# Patient Record
Sex: Male | Born: 1940
Health system: Southern US, Community
[De-identification: ages and names within clinical notes are randomized; demographics above are authoritative.]

## PROBLEM LIST (undated history)

## (undated) DIAGNOSIS — I714 Abdominal aortic aneurysm, without rupture, unspecified: Secondary | ICD-10-CM

## (undated) DIAGNOSIS — E785 Hyperlipidemia, unspecified: Secondary | ICD-10-CM

## (undated) DIAGNOSIS — M109 Gout, unspecified: Secondary | ICD-10-CM

## (undated) DIAGNOSIS — I10 Essential (primary) hypertension: Secondary | ICD-10-CM

## (undated) HISTORY — DX: Gout, unspecified: M10.9

## (undated) HISTORY — DX: Hyperlipidemia, unspecified: E78.5

## (undated) HISTORY — DX: Abdominal aortic aneurysm, without rupture: I71.4

## (undated) HISTORY — DX: Essential (primary) hypertension: I10

## (undated) HISTORY — DX: Abdominal aortic aneurysm, without rupture, unspecified: I71.40

---

## 2003-11-24 LAB — HM COLONOSCOPY: HM Colonoscopy: NORMAL

## 2005-08-20 ENCOUNTER — Ambulatory Visit: Payer: Self-pay

## 2012-07-20 ENCOUNTER — Ambulatory Visit: Payer: Self-pay | Admitting: Medical

## 2012-07-29 ENCOUNTER — Ambulatory Visit: Payer: Self-pay

## 2016-01-26 DIAGNOSIS — E785 Hyperlipidemia, unspecified: Secondary | ICD-10-CM | POA: Insufficient documentation

## 2016-01-26 DIAGNOSIS — E782 Mixed hyperlipidemia: Secondary | ICD-10-CM | POA: Insufficient documentation

## 2016-01-26 DIAGNOSIS — I10 Essential (primary) hypertension: Secondary | ICD-10-CM | POA: Insufficient documentation

## 2016-01-26 DIAGNOSIS — M109 Gout, unspecified: Secondary | ICD-10-CM | POA: Insufficient documentation

## 2016-02-08 ENCOUNTER — Emergency Department: Payer: Medicare Other

## 2016-02-08 ENCOUNTER — Inpatient Hospital Stay: Payer: Medicare Other

## 2016-02-08 ENCOUNTER — Encounter
Admission: EM | Disposition: A | Discharge status: Home / Self Care | Payer: Self-pay | Source: Home / Self Care | Attending: Vascular Surgery

## 2016-02-08 ENCOUNTER — Emergency Department: Payer: Medicare Other | Admitting: Anesthesiology

## 2016-02-08 ENCOUNTER — Inpatient Hospital Stay
Admission: EM | Admit: 2016-02-08 | Discharge: 2016-02-17 | DRG: 268 | Disposition: A | Discharge status: Home / Self Care | Payer: Medicare Other | Attending: Vascular Surgery | Admitting: Vascular Surgery

## 2016-02-08 ENCOUNTER — Encounter: Payer: Self-pay | Admitting: Emergency Medicine

## 2016-02-08 DIAGNOSIS — R578 Other shock: Secondary | ICD-10-CM | POA: Diagnosis present

## 2016-02-08 DIAGNOSIS — K567 Ileus, unspecified: Secondary | ICD-10-CM | POA: Diagnosis not present

## 2016-02-08 DIAGNOSIS — G4730 Sleep apnea, unspecified: Secondary | ICD-10-CM | POA: Diagnosis present

## 2016-02-08 DIAGNOSIS — F0390 Unspecified dementia without behavioral disturbance: Secondary | ICD-10-CM | POA: Diagnosis present

## 2016-02-08 DIAGNOSIS — N170 Acute kidney failure with tubular necrosis: Secondary | ICD-10-CM | POA: Diagnosis not present

## 2016-02-08 DIAGNOSIS — R05 Cough: Secondary | ICD-10-CM

## 2016-02-08 DIAGNOSIS — E785 Hyperlipidemia, unspecified: Secondary | ICD-10-CM | POA: Diagnosis present

## 2016-02-08 DIAGNOSIS — I713 Abdominal aortic aneurysm, ruptured, unspecified: Secondary | ICD-10-CM | POA: Diagnosis present

## 2016-02-08 DIAGNOSIS — R410 Disorientation, unspecified: Secondary | ICD-10-CM | POA: Diagnosis not present

## 2016-02-08 DIAGNOSIS — F1020 Alcohol dependence, uncomplicated: Secondary | ICD-10-CM | POA: Diagnosis present

## 2016-02-08 DIAGNOSIS — I10 Essential (primary) hypertension: Secondary | ICD-10-CM | POA: Diagnosis present

## 2016-02-08 DIAGNOSIS — J9600 Acute respiratory failure, unspecified whether with hypoxia or hypercapnia: Secondary | ICD-10-CM | POA: Diagnosis not present

## 2016-02-08 DIAGNOSIS — Z87891 Personal history of nicotine dependence: Secondary | ICD-10-CM

## 2016-02-08 DIAGNOSIS — E875 Hyperkalemia: Secondary | ICD-10-CM | POA: Diagnosis present

## 2016-02-08 DIAGNOSIS — J690 Pneumonitis due to inhalation of food and vomit: Secondary | ICD-10-CM | POA: Diagnosis not present

## 2016-02-08 DIAGNOSIS — K9189 Other postprocedural complications and disorders of digestive system: Secondary | ICD-10-CM | POA: Diagnosis not present

## 2016-02-08 DIAGNOSIS — E876 Hypokalemia: Secondary | ICD-10-CM | POA: Diagnosis not present

## 2016-02-08 DIAGNOSIS — G9349 Other encephalopathy: Secondary | ICD-10-CM | POA: Diagnosis not present

## 2016-02-08 DIAGNOSIS — R059 Cough, unspecified: Secondary | ICD-10-CM

## 2016-02-08 DIAGNOSIS — Z9114 Patient's other noncompliance with medication regimen: Secondary | ICD-10-CM

## 2016-02-08 DIAGNOSIS — I959 Hypotension, unspecified: Secondary | ICD-10-CM | POA: Diagnosis not present

## 2016-02-08 DIAGNOSIS — M545 Low back pain, unspecified: Secondary | ICD-10-CM

## 2016-02-08 DIAGNOSIS — I9589 Other hypotension: Secondary | ICD-10-CM | POA: Diagnosis present

## 2016-02-08 DIAGNOSIS — J95821 Acute postprocedural respiratory failure: Secondary | ICD-10-CM | POA: Diagnosis not present

## 2016-02-08 DIAGNOSIS — F10231 Alcohol dependence with withdrawal delirium: Secondary | ICD-10-CM | POA: Diagnosis not present

## 2016-02-08 HISTORY — PX: PERIPHERAL VASCULAR CATHETERIZATION: SHX172C

## 2016-02-08 LAB — CBC WITH DIFFERENTIAL/PLATELET
BASOS PCT: 1 %
Basophils Absolute: 0.1 10*3/uL (ref 0–0.1)
Eosinophils Absolute: 0.1 10*3/uL (ref 0–0.7)
Eosinophils Relative: 1 %
HCT: 41.1 % (ref 40.0–52.0)
HEMOGLOBIN: 13.7 g/dL (ref 13.0–18.0)
LYMPHS ABS: 2.2 10*3/uL (ref 1.0–3.6)
Lymphocytes Relative: 14 %
MCH: 32.6 pg (ref 26.0–34.0)
MCHC: 33.3 g/dL (ref 32.0–36.0)
MCV: 97.9 fL (ref 80.0–100.0)
MONO ABS: 0.9 10*3/uL (ref 0.2–1.0)
MONOS PCT: 6 %
NEUTROS ABS: 12.2 10*3/uL — AB (ref 1.4–6.5)
Neutrophils Relative %: 78 %
PLATELETS: 332 10*3/uL (ref 150–440)
RBC: 4.2 MIL/uL — ABNORMAL LOW (ref 4.40–5.90)
RDW: 13.7 % (ref 11.5–14.5)
WBC: 15.5 10*3/uL — ABNORMAL HIGH (ref 3.8–10.6)

## 2016-02-08 LAB — ABO/RH: ABO/RH(D): A POS

## 2016-02-08 LAB — COMPREHENSIVE METABOLIC PANEL
ALT: 11 U/L — AB (ref 17–63)
ANION GAP: 13 (ref 5–15)
AST: 18 U/L (ref 15–41)
Albumin: 3.5 g/dL (ref 3.5–5.0)
Alkaline Phosphatase: 105 U/L (ref 38–126)
BUN: 10 mg/dL (ref 6–20)
CALCIUM: 9.1 mg/dL (ref 8.9–10.3)
CHLORIDE: 100 mmol/L — AB (ref 101–111)
CO2: 24 mmol/L (ref 22–32)
CREATININE: 1.08 mg/dL (ref 0.61–1.24)
Glucose, Bld: 216 mg/dL — ABNORMAL HIGH (ref 65–99)
Potassium: 3.1 mmol/L — ABNORMAL LOW (ref 3.5–5.1)
SODIUM: 137 mmol/L (ref 135–145)
Total Bilirubin: 1 mg/dL (ref 0.3–1.2)
Total Protein: 8 g/dL (ref 6.5–8.1)

## 2016-02-08 LAB — PROTIME-INR
INR: 1.06
Prothrombin Time: 14 seconds (ref 11.4–15.0)

## 2016-02-08 LAB — TROPONIN I

## 2016-02-08 LAB — APTT: APTT: 26 s (ref 24–36)

## 2016-02-08 LAB — ETHANOL

## 2016-02-08 LAB — PREPARE RBC (CROSSMATCH)

## 2016-02-08 IMAGING — CT CT CTA ABD/PEL W/CM AND/OR W/O CM
2 of 7 series · 15 of 46 positions shown, 17 images · IV contrast (isovue)
Comparison: Plain film from earlier in the same day.

CLINICAL DATA: Increasing back pain

EXAM:
CTA ABDOMEN AND PELVIS wITHOUT AND WITH CONTRAST
TECHNIQUE: Multidetector CT imaging of the abdomen and pelvis was performed
using the standard protocol during bolus administration of
intravenous contrast. Multiplanar reconstructed images and MIPs were
obtained and reviewed to evaluate the vascular anatomy.
CONTRAST:  100 mL Isovue 370.

[Series 4: arterial · axial · arterial · 0.67mm/px · z∈[-570,-142]mm · 12 of 238 slices shown, 14 images]
[im 12/238  soft-tissue]
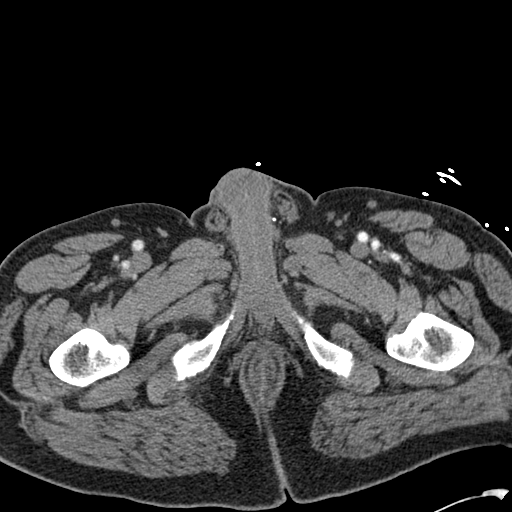
[im 12/238  bone]
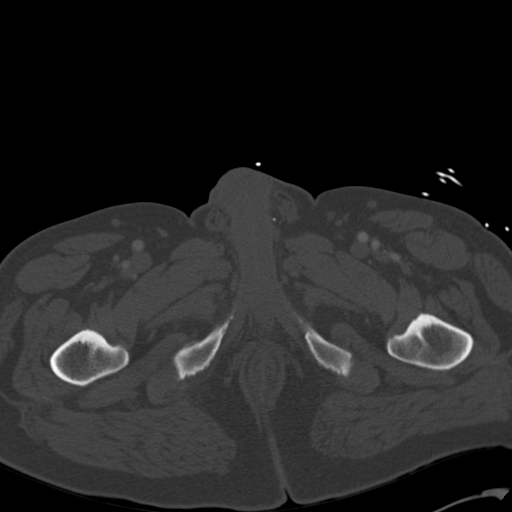
[im 36/238  soft-tissue]
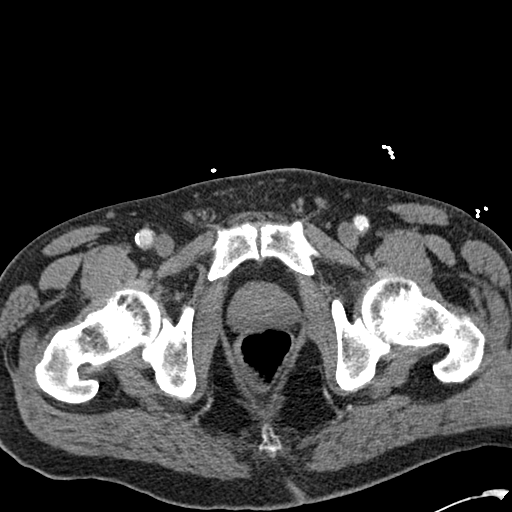
[im 48/238  soft-tissue]
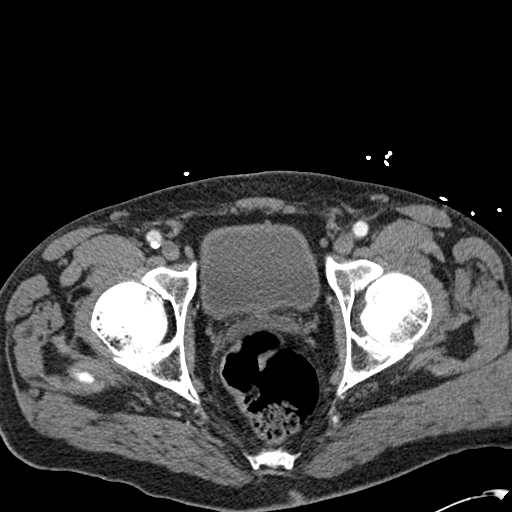
[im 72/238  soft-tissue]
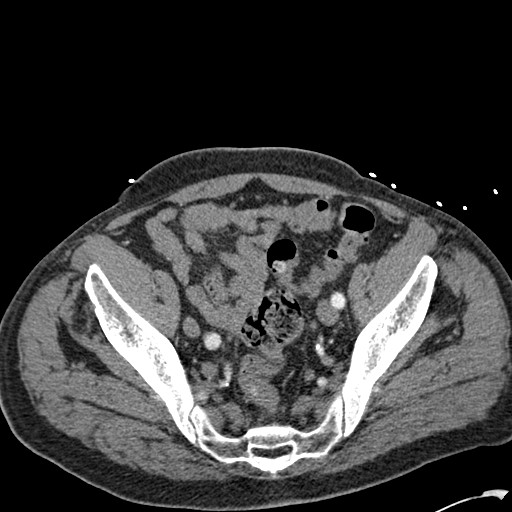
[im 95/238  soft-tissue]
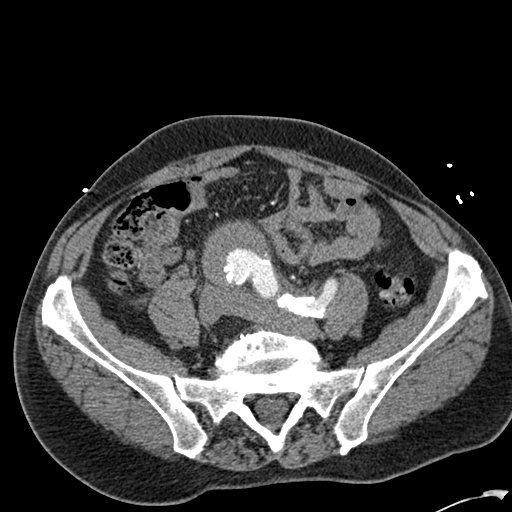
[im 107/238  soft-tissue]
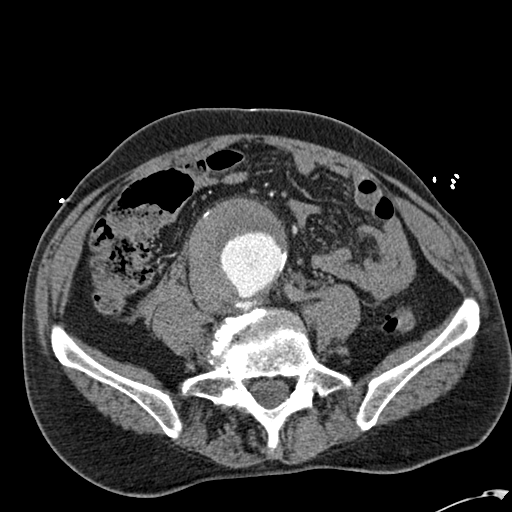
[im 131/238  soft-tissue]
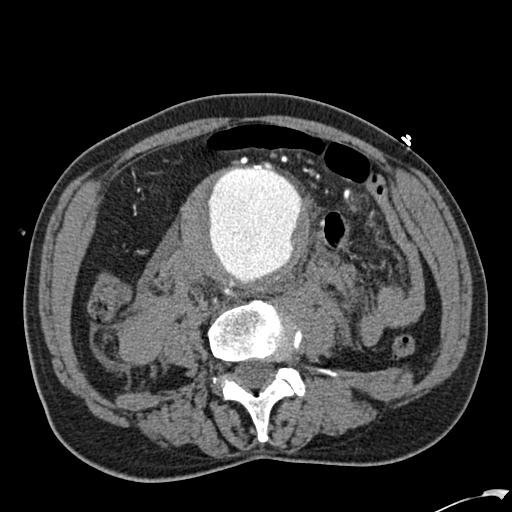
[im 143/238  soft-tissue]
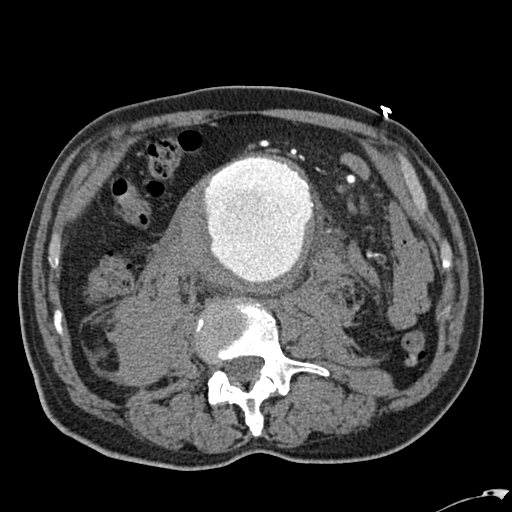
[im 166/238  soft-tissue]
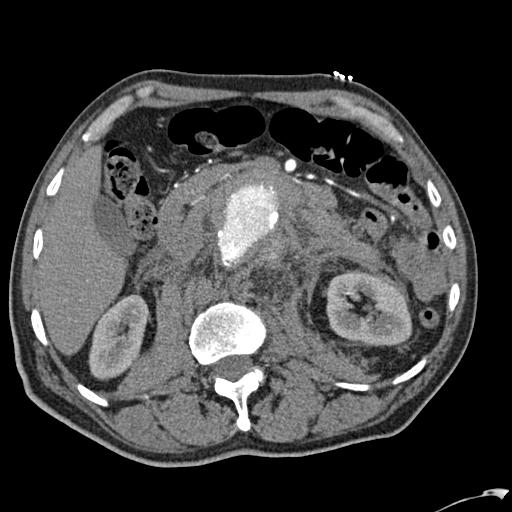
[im 166/238  bone]
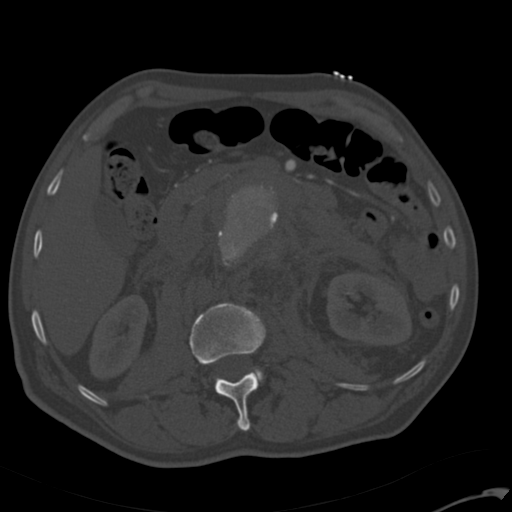
[im 190/238  soft-tissue]
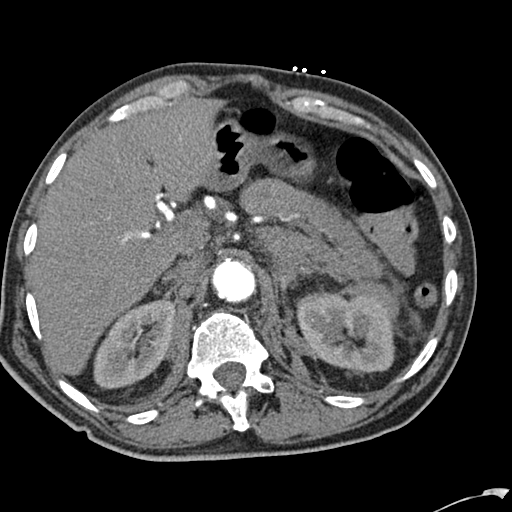
[im 202/238  soft-tissue]
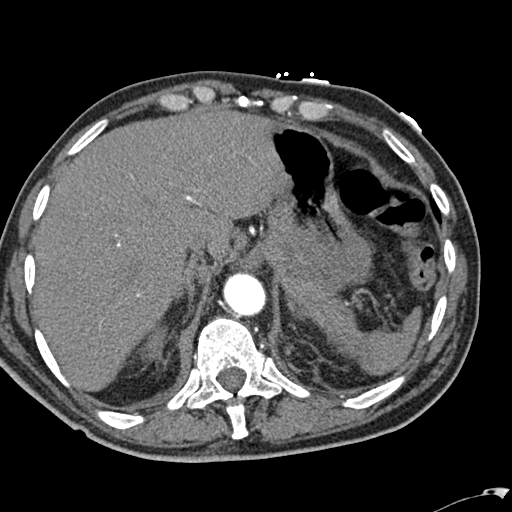
[im 226/238  soft-tissue]
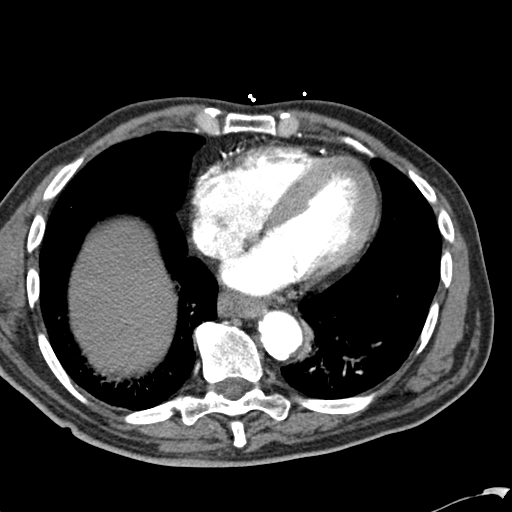

[Series 8: cor arterial mpr · coronal · arterial · 0.67mm/px · 3 of 133 slices shown]
[im 34/133  soft-tissue]
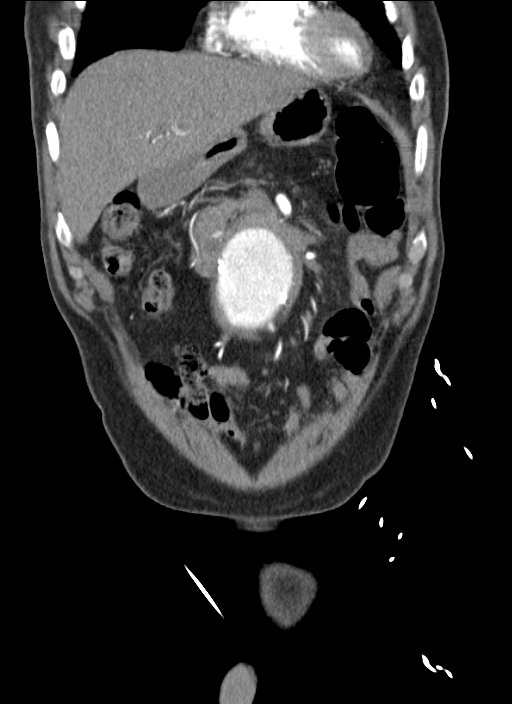
[im 67/133  soft-tissue]
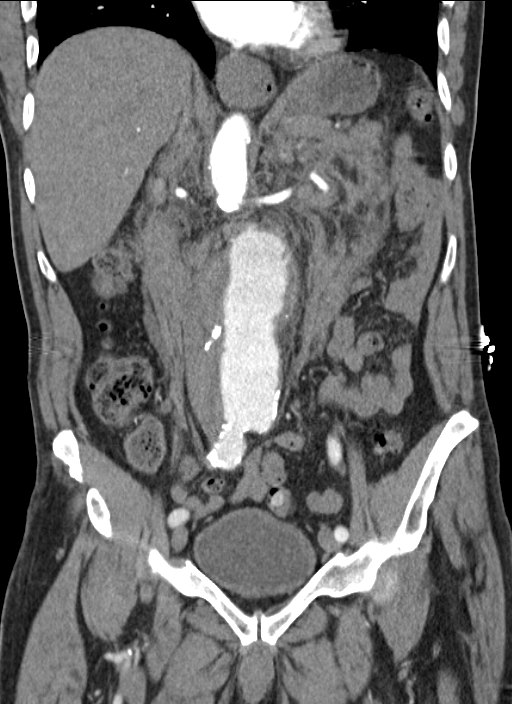
[im 100/133  soft-tissue]
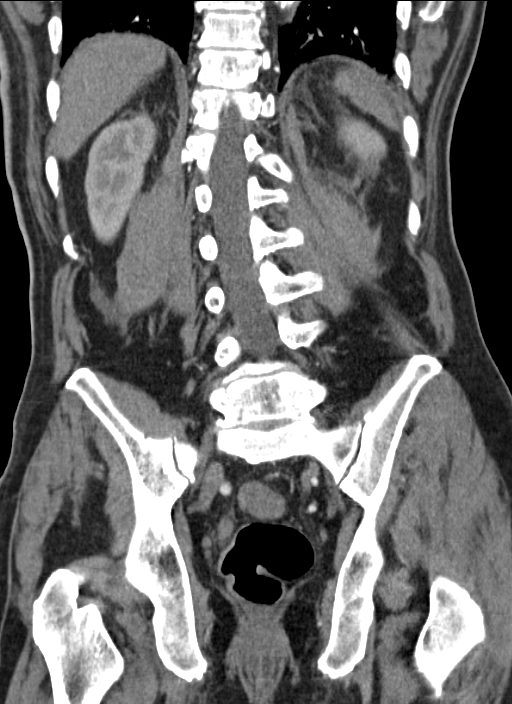

[15 of 46 positions shown; findings below may reference images not displayed]

FINDINGS: The lung bases demonstrate some minimal dependent atelectatic
changes. No sizable effusion is seen.

There is evidence of a large abdominal aortic aneurysm which
measures at least 8.9 by 8.8 cm in greatest AP and transverse
dimensions respectively. There is considerable mural thrombus
identified within the aneurysm however at its most superior aspect
just below the renal arteries there is evidence of active
extravasation posteriorly. On the delayed images increasing passage
of contrast material posteriorly is noted. Considerable changes are
noted surrounding the aorta consistent with recent extravasation.
These appear more subacute in nature given the acute hemorrhage seen
on the current exam. The infrarenal neck a short measuring
approximately 11 mm. Heavy calcification is noted in the infrarenal
aorta as well.

There are changes suggestive of occlusion of the celiac axis just
beyond its origin. Considerable collateralization from the superior
mesenteric artery to the celiac axis branches is noted. The inferior
mesenteric artery is patent arising from the anterior aspect of the
aortic aneurysm.

There is aneurysmal dilatation of the right common iliac artery to
3.1 cm. The remainder the iliac vessels although somewhat calcified
are otherwise within normal limits.

The liver, spleen, adrenal glands and pancreas are within normal
limits. The gallbladder is well distended. The kidneys demonstrate a
normal enhancement pattern without obstructive changes.

As previously described, there is considerable fluid attenuation
within the retroperitoneal structures consistent with prior
extravasation from the aneurysm. No free pelvic fluid is seen. The
bladder is well distended. The appendix is within normal limits.
Scattered diverticular change is noted without diverticulitis. Bony
structures are within normal limits for the patient's given age.

Review of the MIP images confirms the above findings.
IMPRESSION: Large abdominal aortic aneurysm with evidence of rupture and active
extravasation during the exam. There also changes of prior
extravasation in the retroperitoneum. The infrarenal neck a short at
11 mm.

Right common iliac artery aneurysm.

Other chronic changes as described

Critical Value/emergent results were called by telephone by Dr. ARVIE
ARVIE at the time of interpretation on [DATE] at [DATE] to Dr.
ARVIE , who verbally acknowledged these results.

## 2016-02-08 IMAGING — DX DG CHEST 1V PORT
1 series · 1 of 1 positions shown · non-contrast
Comparison: Earlier this day at [8Y] hour

CLINICAL DATA: Post intubation.

EXAM:
PORTABLE CHEST 1 VIEW

[chest ap]
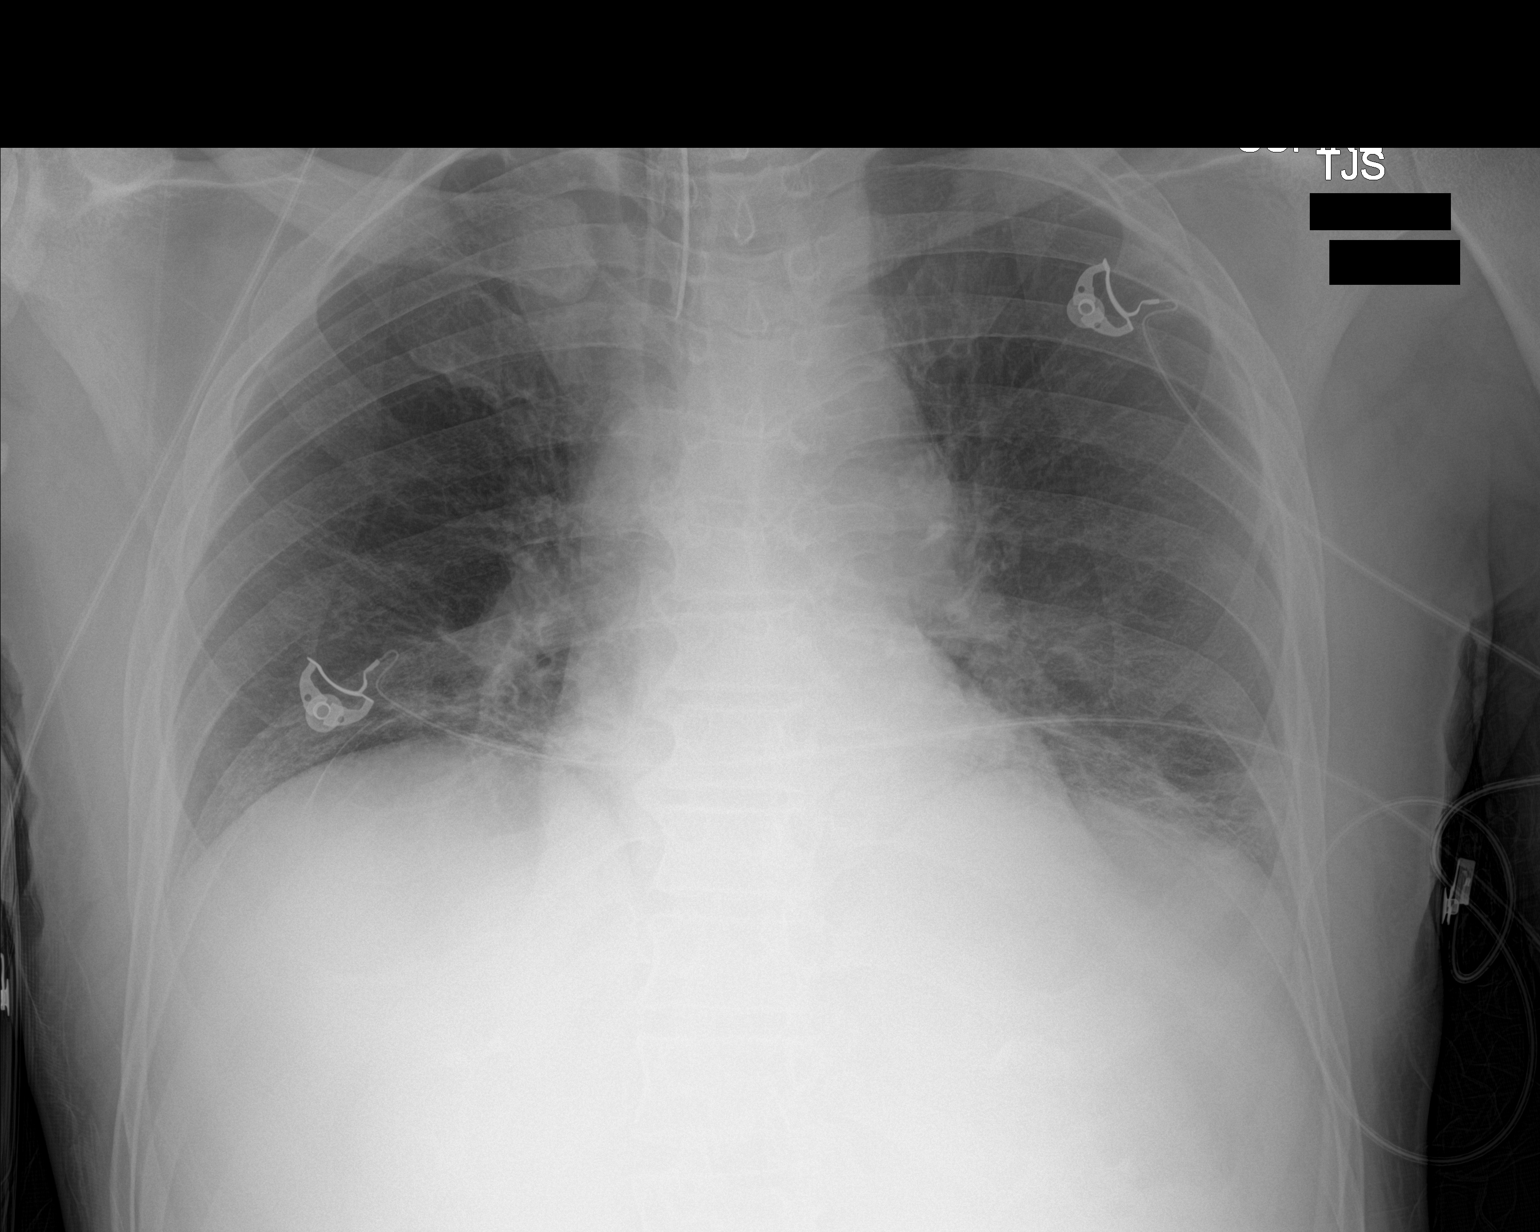

[1 of 1 positions shown; findings below may reference images not displayed]

FINDINGS: Endotracheal tube 4.6 cm from the carina. Low lung volumes persist.
Increasing bibasilar atelectasis. Cardiomediastinal contours are
unchanged. No pneumothorax or large pleural effusion.
IMPRESSION: 1. Endotracheal tube 4.6 cm from the carina.
2. Increasing bibasilar atelectasis.

## 2016-02-08 IMAGING — CR DG LUMBAR SPINE 2-3V
1 series · 3 of 3 positions shown · non-contrast
Comparison: None.

CLINICAL DATA: Low back pain for 1 week

EXAM:
LUMBAR SPINE - 3 VIEW

[Series 1: t lumbar spine ap · 0.14mm/px · 3 of 3 slices shown]
[im 1/3]
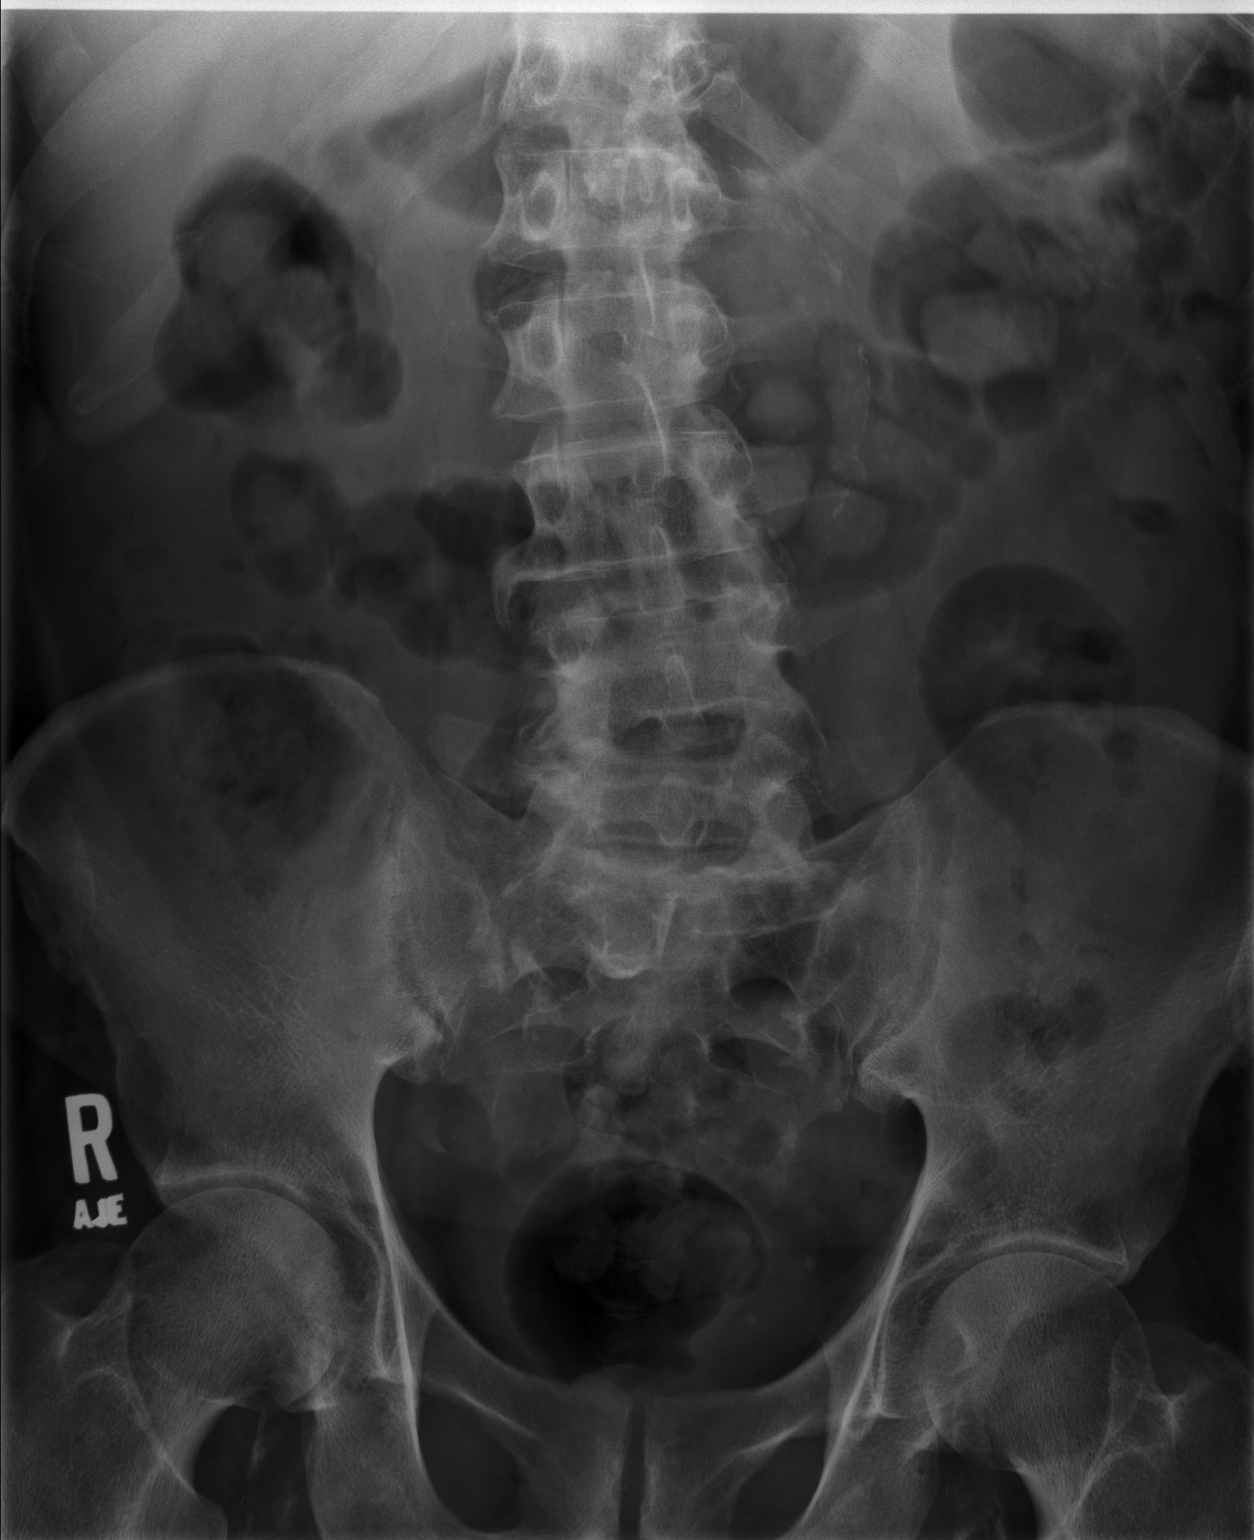
[im 2/3]
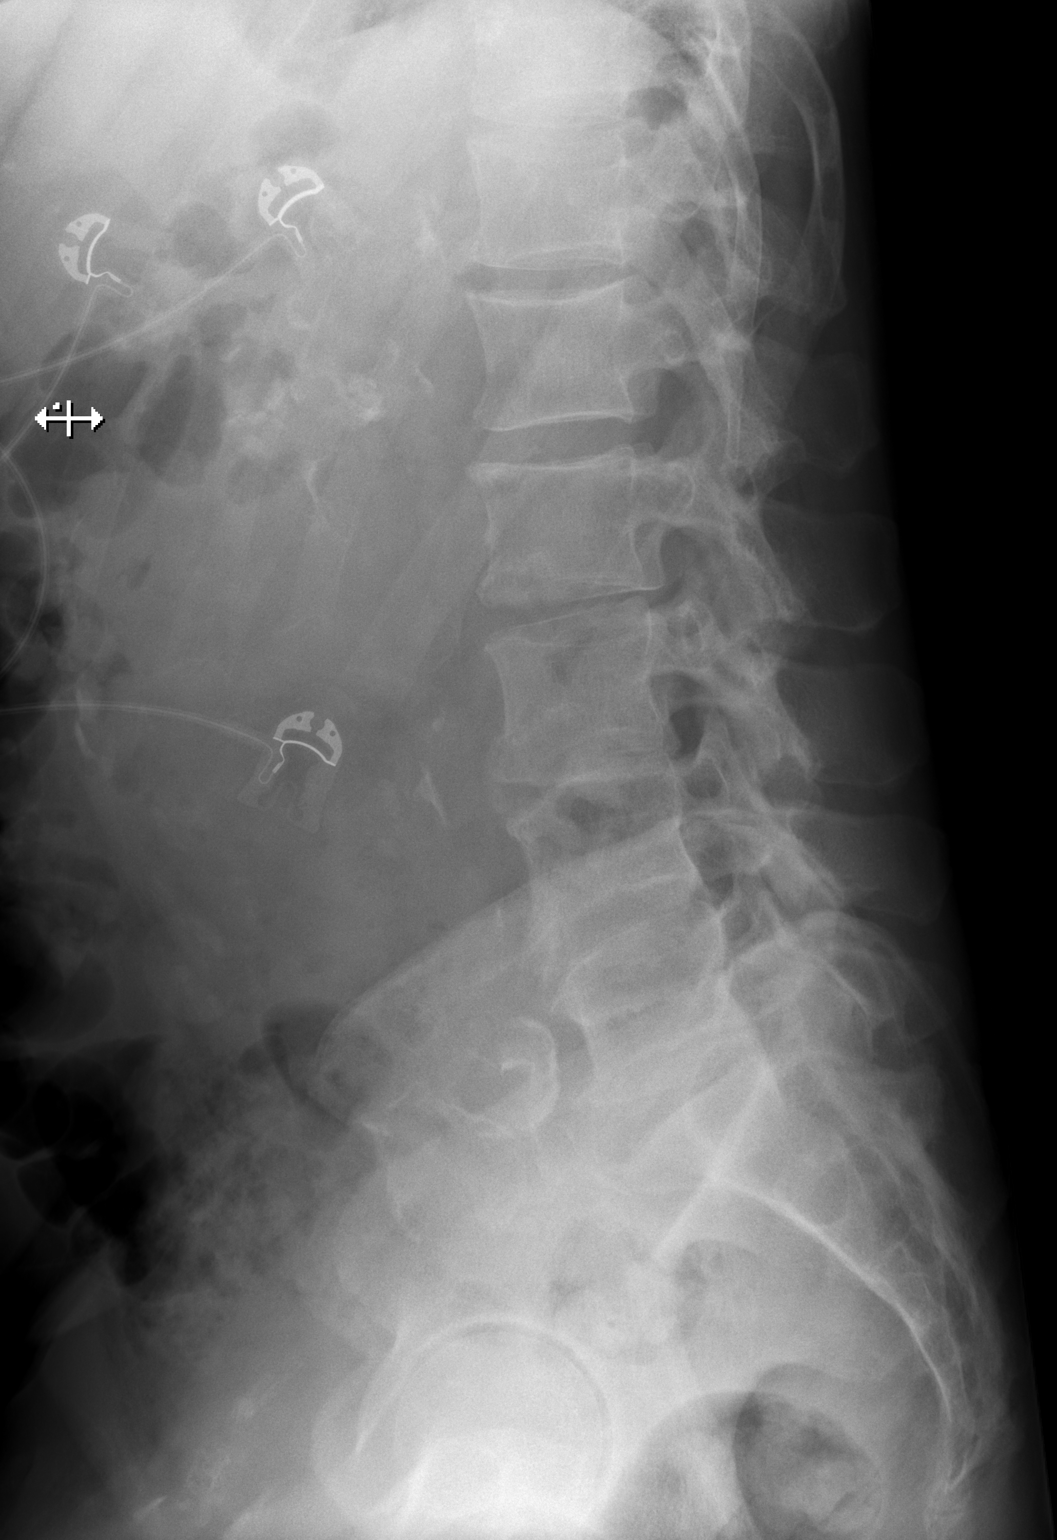
[im 3/3]
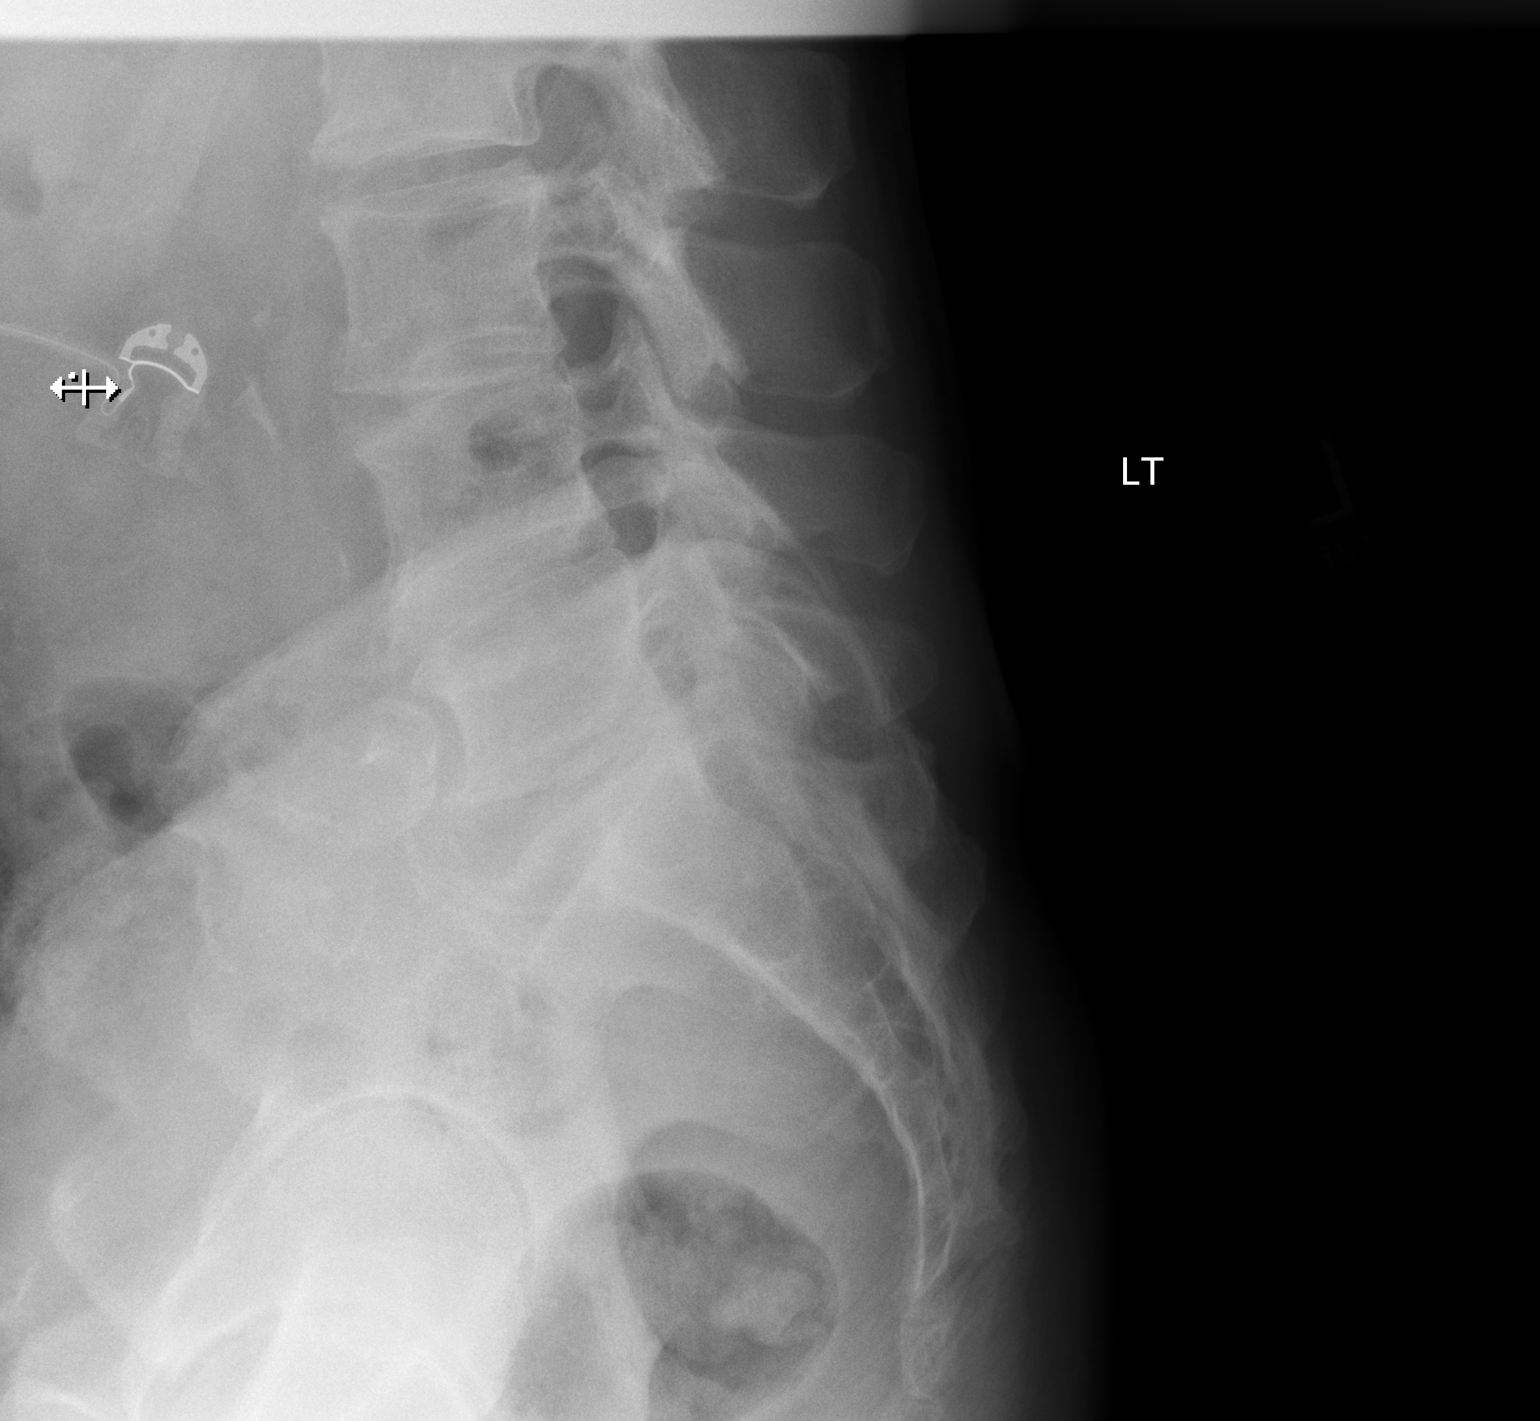

[3 of 3 positions shown; findings below may reference images not displayed]

FINDINGS: Five lumbar type vertebral bodies are well visualized. Vertebral
body height is well maintained. Osteophytic changes are noted
throughout the lumbar spine. No anterolisthesis is seen. Aortic
calcifications are noted. There calcifications significantly
displaced to the left of the midline raising suspicion for abdominal
aortic aneurysm. Correlation with the physical exam is recommended.
CT would be helpful for further evaluation.
IMPRESSION: Calcifications arising from the aorta which appears significantly
displaced from the midline. This may be related to a tortuous aorta
although the possibility of an aortic aneurysm could not be totally
excluded. CT would be helpful for further evaluation.

## 2016-02-08 IMAGING — DX DG CHEST 1V PORT
1 series · 1 of 1 positions shown · non-contrast
Comparison: None.

CLINICAL DATA: Increasing pain.

EXAM:
PORTABLE CHEST 1 VIEW

[chest ap]
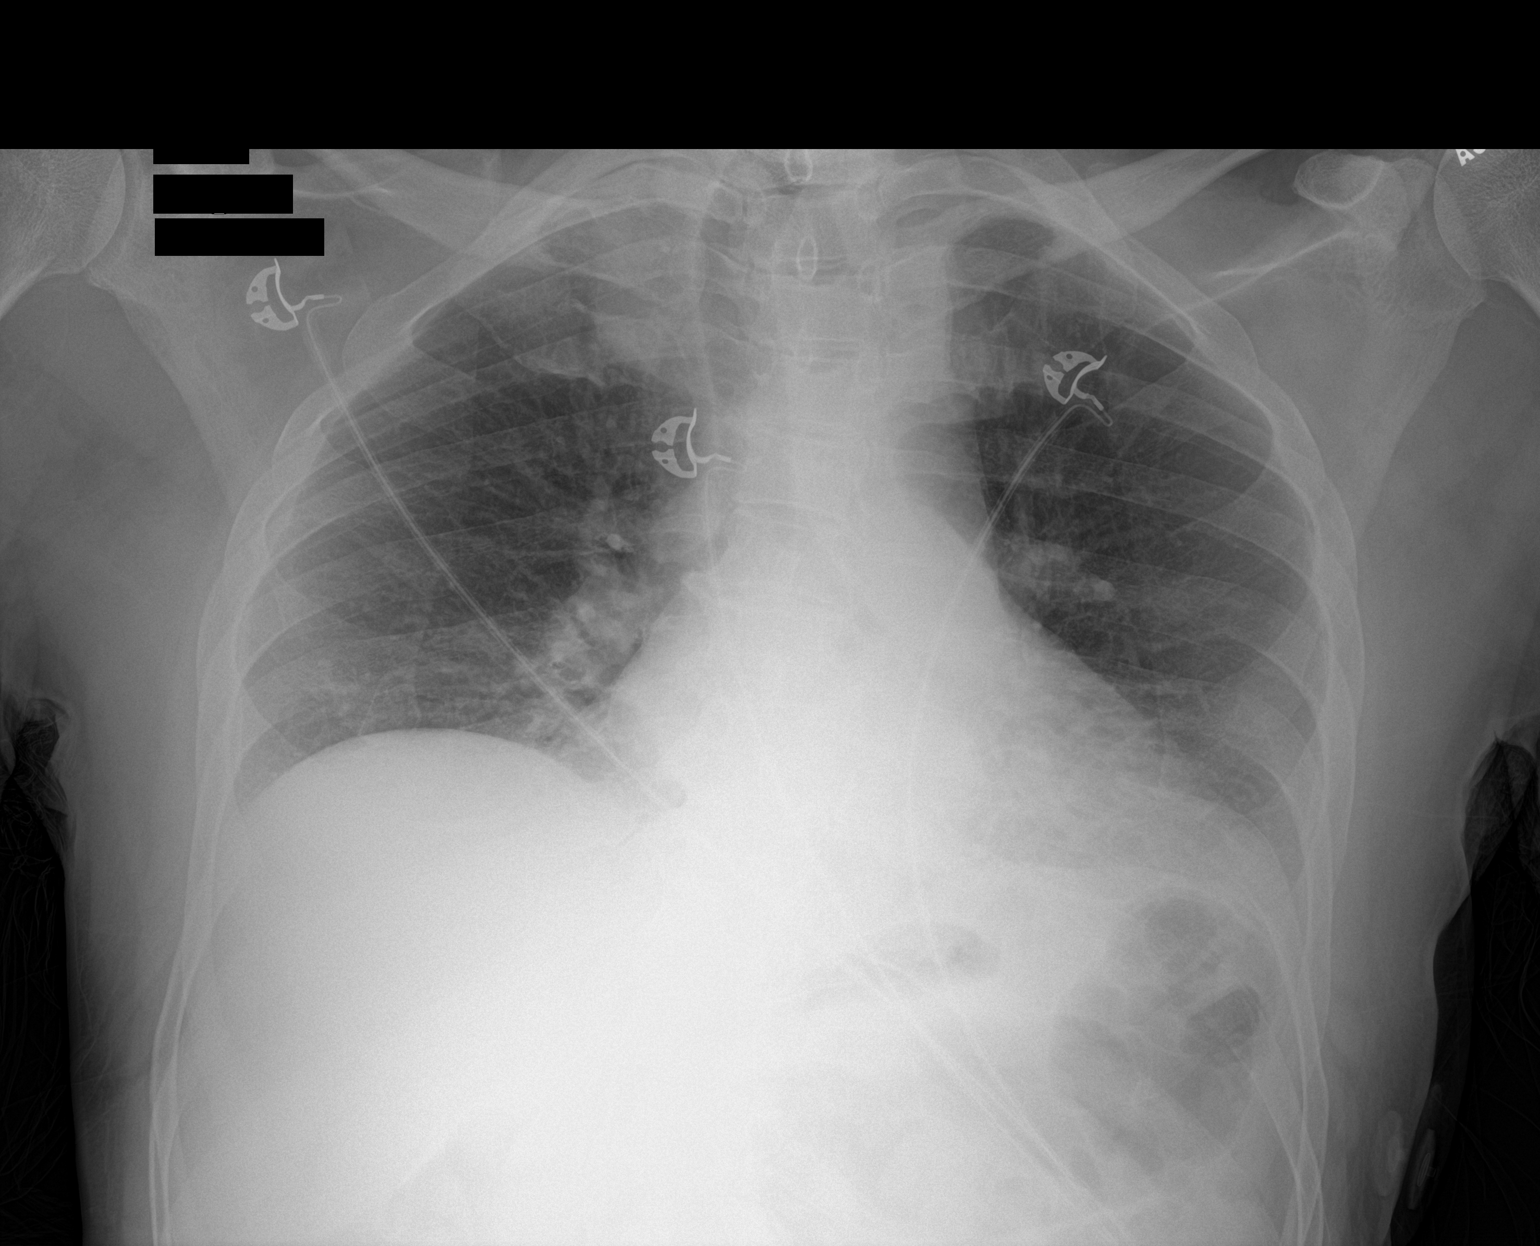

[1 of 1 positions shown; findings below may reference images not displayed]

FINDINGS: Normal heart size. Decreased lung volumes. No pleural effusion or
edema. No airspace consolidation.
IMPRESSION: 1. Low lung volumes.

## 2016-02-08 SURGERY — ENDOVASCULAR REPAIR/STENT GRAFT
Anesthesia: General

## 2016-02-08 SURGERY — ANEURYSM ABDOMINAL AORTIC REPAIR
Anesthesia: General

## 2016-02-08 MED ORDER — MORPHINE SULFATE (PF) 4 MG/ML IV SOLN
4.0000 mg | Freq: Once | INTRAVENOUS | Status: AC
  Administered 2016-02-08: 4 mg via INTRAVENOUS

## 2016-02-08 MED ORDER — GUAIFENESIN-DM 100-10 MG/5ML PO SYRP: 15.0000 mL | ORAL_SOLUTION | ORAL | Status: DC | PRN

## 2016-02-08 MED ORDER — PHENYLEPHRINE HCL 10 MG/ML IJ SOLN
INTRAMUSCULAR | Status: DC | PRN
  Administered 2016-02-08 (×2): 100 ug via INTRAVENOUS

## 2016-02-08 MED ORDER — LACTATED RINGERS IV SOLN
INTRAVENOUS | Status: DC | PRN
  Administered 2016-02-08 (×2): via INTRAVENOUS

## 2016-02-08 MED ORDER — SODIUM CHLORIDE 0.9 % IV SOLN
INTRAVENOUS | Status: DC
  Administered 2016-02-09 (×2): via INTRAVENOUS

## 2016-02-08 MED ORDER — ACETAMINOPHEN 325 MG PO TABS
325.0000 mg | ORAL_TABLET | ORAL | Status: DC | PRN
Start: 2016-02-08 — End: 2016-02-17
  Administered 2016-02-15: 650 mg via ORAL
  Filled 2016-02-08 (×2): qty 2

## 2016-02-08 MED ORDER — MAGNESIUM SULFATE 2 GM/50ML IV SOLN
2.0000 g | Freq: Every day | INTRAVENOUS | Status: DC | PRN
  Filled 2016-02-08: qty 50

## 2016-02-08 MED ORDER — LABETALOL HCL 5 MG/ML IV SOLN
10.0000 mg | INTRAVENOUS | Status: DC | PRN
  Administered 2016-02-11: 10 mg via INTRAVENOUS
  Filled 2016-02-08 (×2): qty 4

## 2016-02-08 MED ORDER — ETOMIDATE 2 MG/ML IV SOLN
INTRAVENOUS | Status: DC | PRN
  Administered 2016-02-08: 16 mg via INTRAVENOUS

## 2016-02-08 MED ORDER — FENTANYL CITRATE (PF) 100 MCG/2ML IJ SOLN
INTRAMUSCULAR | Status: AC
  Administered 2016-02-08: 50 ug via INTRAVENOUS
  Filled 2016-02-08: qty 2

## 2016-02-08 MED ORDER — DOPAMINE-DEXTROSE 3.2-5 MG/ML-% IV SOLN: 3.0000 ug/kg/min | INTRAVENOUS | Status: DC

## 2016-02-08 MED ORDER — CEFUROXIME SODIUM 1.5 G IJ SOLR
1.5000 g | Freq: Two times a day (BID) | INTRAMUSCULAR | Status: AC
  Administered 2016-02-09 (×2): 1.5 g via INTRAVENOUS
  Filled 2016-02-08 (×3): qty 1.5

## 2016-02-08 MED ORDER — ROCURONIUM BROMIDE 100 MG/10ML IV SOLN
INTRAVENOUS | Status: DC | PRN
  Administered 2016-02-08: 50 mg via INTRAVENOUS
  Administered 2016-02-08: 30 mg via INTRAVENOUS
  Administered 2016-02-08: 20 mg via INTRAVENOUS

## 2016-02-08 MED ORDER — SODIUM CHLORIDE 0.9 % IV SOLN
10.0000 mL/h | Freq: Once | INTRAVENOUS | Status: AC
  Administered 2016-02-08: 10 mL/h via INTRAVENOUS

## 2016-02-08 MED ORDER — ONDANSETRON HCL 4 MG/2ML IJ SOLN
4.0000 mg | Freq: Once | INTRAMUSCULAR | Status: AC
  Administered 2016-02-08: 4 mg via INTRAVENOUS

## 2016-02-08 MED ORDER — NITROGLYCERIN IN D5W 200-5 MCG/ML-% IV SOLN: 5.0000 ug/min | INTRAVENOUS | Status: DC

## 2016-02-08 MED ORDER — MIDAZOLAM HCL 2 MG/2ML IJ SOLN: 2.0000 mg | INTRAMUSCULAR | Status: DC | PRN

## 2016-02-08 MED ORDER — FAMOTIDINE IN NACL 20-0.9 MG/50ML-% IV SOLN
20.0000 mg | Freq: Two times a day (BID) | INTRAVENOUS | Status: DC
  Filled 2016-02-08 (×2): qty 50

## 2016-02-08 MED ORDER — SODIUM CHLORIDE 0.9 % IV SOLN
INTRAVENOUS | Status: DC | PRN
  Administered 2016-02-08: 20:00:00 via INTRAVENOUS

## 2016-02-08 MED ORDER — SODIUM CHLORIDE 0.9 % IV BOLUS (SEPSIS)
1000.0000 mL | Freq: Once | INTRAVENOUS | Status: AC
  Administered 2016-02-08: 1000 mL via INTRAVENOUS

## 2016-02-08 MED ORDER — ONDANSETRON HCL 4 MG/2ML IJ SOLN: 4.0000 mg | Freq: Four times a day (QID) | INTRAMUSCULAR | Status: DC | PRN

## 2016-02-08 MED ORDER — SUCCINYLCHOLINE CHLORIDE 20 MG/ML IJ SOLN
INTRAMUSCULAR | Status: DC | PRN
  Administered 2016-02-08: 60 mg via INTRAVENOUS

## 2016-02-08 MED ORDER — ONDANSETRON HCL 4 MG/2ML IJ SOLN
INTRAMUSCULAR | Status: AC
  Administered 2016-02-08: 4 mg via INTRAVENOUS
  Filled 2016-02-08: qty 2

## 2016-02-08 MED ORDER — LORAZEPAM 2 MG/ML IJ SOLN
1.0000 mg | Freq: Once | INTRAMUSCULAR | Status: AC
  Administered 2016-02-08: 1 mg via INTRAVENOUS
  Filled 2016-02-08: qty 1

## 2016-02-08 MED ORDER — MIDAZOLAM HCL 2 MG/2ML IJ SOLN
INTRAMUSCULAR | Status: DC | PRN
  Administered 2016-02-08: 2 mg via INTRAVENOUS

## 2016-02-08 MED ORDER — HYDRALAZINE HCL 20 MG/ML IJ SOLN: 5.0000 mg | INTRAMUSCULAR | Status: DC | PRN

## 2016-02-08 MED ORDER — MORPHINE SULFATE (PF) 2 MG/ML IV SOLN: 2.0000 mg | INTRAVENOUS | Status: DC | PRN

## 2016-02-08 MED ORDER — SODIUM CHLORIDE 0.9 % IV SOLN: 500.0000 mL | Freq: Once | INTRAVENOUS | Status: DC | PRN

## 2016-02-08 MED ORDER — DOCUSATE SODIUM 100 MG PO CAPS
100.0000 mg | ORAL_CAPSULE | Freq: Every day | ORAL | Status: DC
  Administered 2016-02-11 – 2016-02-17 (×7): 100 mg via ORAL
  Filled 2016-02-08 (×8): qty 1

## 2016-02-08 MED ORDER — MORPHINE SULFATE (PF) 4 MG/ML IV SOLN
INTRAVENOUS | Status: AC
  Filled 2016-02-08: qty 1

## 2016-02-08 MED ORDER — PHENOL 1.4 % MT LIQD
1.0000 | OROMUCOSAL | Status: DC | PRN
  Filled 2016-02-08: qty 177

## 2016-02-08 MED ORDER — POTASSIUM CHLORIDE CRYS ER 20 MEQ PO TBCR: 20.0000 meq | EXTENDED_RELEASE_TABLET | Freq: Every day | ORAL | Status: DC | PRN

## 2016-02-08 MED ORDER — METOPROLOL TARTRATE 5 MG/5ML IV SOLN
2.0000 mg | INTRAVENOUS | Status: DC | PRN
Start: 2016-02-08 — End: 2016-02-12

## 2016-02-08 MED ORDER — FENTANYL CITRATE (PF) 100 MCG/2ML IJ SOLN
INTRAMUSCULAR | Status: DC | PRN
  Administered 2016-02-08 (×2): 50 ug via INTRAVENOUS

## 2016-02-08 MED ORDER — ACETAMINOPHEN 650 MG RE SUPP
325.0000 mg | RECTAL | Status: DC | PRN
  Administered 2016-02-14: 650 mg via RECTAL
  Filled 2016-02-08: qty 1

## 2016-02-08 MED ORDER — IOPAMIDOL (ISOVUE-300) INJECTION 61%
INTRAVENOUS | Status: DC | PRN
Start: 2016-02-08 — End: 2016-02-08
  Administered 2016-02-08: 120 mL via INTRA_ARTERIAL

## 2016-02-08 MED ORDER — PHENYLEPHRINE HCL 10 MG/ML IJ SOLN
10000.0000 ug | INTRAMUSCULAR | Status: DC | PRN
  Administered 2016-02-08: 10 ug via INTRAVENOUS

## 2016-02-08 MED ORDER — HEPARIN SODIUM (PORCINE) 1000 UNIT/ML IJ SOLN
INTRAMUSCULAR | Status: DC | PRN
  Administered 2016-02-08: 3000 [IU] via INTRAVENOUS

## 2016-02-08 MED ORDER — OXYCODONE-ACETAMINOPHEN 5-325 MG PO TABS: 1.0000 | ORAL_TABLET | ORAL | Status: DC | PRN

## 2016-02-08 MED ORDER — HYDROMORPHONE HCL 1 MG/ML IJ SOLN
1.0000 mg | Freq: Once | INTRAMUSCULAR | Status: AC
  Administered 2016-02-08: 1 mg via INTRAVENOUS
  Filled 2016-02-08: qty 1

## 2016-02-08 MED ORDER — SODIUM CHLORIDE 0.9 % IV BOLUS (SEPSIS)
500.0000 mL | Freq: Once | INTRAVENOUS | Status: AC
  Administered 2016-02-08: 500 mL via INTRAVENOUS

## 2016-02-08 MED ORDER — FENTANYL CITRATE (PF) 100 MCG/2ML IJ SOLN
50.0000 ug | Freq: Once | INTRAMUSCULAR | Status: AC
  Administered 2016-02-08: 50 ug via INTRAVENOUS

## 2016-02-08 MED ORDER — IOPAMIDOL (ISOVUE-370) INJECTION 76%
100.0000 mL | Freq: Once | INTRAVENOUS | Status: AC | PRN
  Administered 2016-02-08: 100 mL via INTRAVENOUS

## 2016-02-08 MED ORDER — MANNITOL 25 % IV SOLN
0.2500 g/kg | Freq: Once | INTRAVENOUS | Status: AC
  Administered 2016-02-09: 21.55 g via INTRAVENOUS
  Filled 2016-02-08 (×2): qty 86.2

## 2016-02-08 MED ORDER — ALUM & MAG HYDROXIDE-SIMETH 200-200-20 MG/5ML PO SUSP: 15.0000 mL | ORAL | Status: DC | PRN

## 2016-02-08 SURGICAL SUPPLY — 68 items
APPLIER CLIP 11 MED OPEN (CLIP)
APPLIER CLIP 13 LRG OPEN (CLIP)
APPLIER CLIP 9.375 SM OPEN (CLIP)
BAG DECANTER FOR FLEXI CONT (MISCELLANEOUS) IMPLANT
BAG ISOLATATION DRAPE 20X20 ST (DRAPES) IMPLANT
BLADE SURG 15 STRL LF DISP TIS (BLADE) IMPLANT
BLADE SURG 15 STRL SS (BLADE)
BLADE SURG SZ11 CARB STEEL (BLADE) IMPLANT
BOOT SUTURE AID YELLOW STND (SUTURE) IMPLANT
BRUSH SCRUB 4% CHG (MISCELLANEOUS) IMPLANT
CANISTER SUCT 1200ML W/VALVE (MISCELLANEOUS) IMPLANT
CATH TRAY 16F METER LATEX (MISCELLANEOUS) IMPLANT
CLIP APPLIE 11 MED OPEN (CLIP) IMPLANT
CLIP APPLIE 13 LRG OPEN (CLIP) IMPLANT
CLIP APPLIE 9.375 SM OPEN (CLIP) IMPLANT
COVER PROBE FLX POLY STRL (MISCELLANEOUS) IMPLANT
DRAPE INCISE IOBAN 66X60 STRL (DRAPES) IMPLANT
DRAPE ISOLATE BAG 20X20 STRL (DRAPES)
DRAPE MAG INST 16X20 L/F (DRAPES) IMPLANT
DRAPE SHEET LG 3/4 BI-LAMINATE (DRAPES) IMPLANT
DRAPE TABLE BACK 80X90 (DRAPES) IMPLANT
DRSG TEGADERM 6X8 (GAUZE/BANDAGES/DRESSINGS) IMPLANT
DURAPREP 26ML APPLICATOR (WOUND CARE) IMPLANT
ELECT BLADE 6.5 EXT (BLADE) IMPLANT
ELECT CAUTERY BLADE 6.4 (BLADE) IMPLANT
ELECT REM PT RETURN 9FT ADLT (ELECTROSURGICAL)
ELECTRODE REM PT RTRN 9FT ADLT (ELECTROSURGICAL) IMPLANT
GAUZE SPONGE 4X4 12PLY STRL (GAUZE/BANDAGES/DRESSINGS) IMPLANT
GLOVE BIO SURGEON STRL SZ7 (GLOVE) IMPLANT
GLOVE INDICATOR 7.5 STRL GRN (GLOVE) IMPLANT
GOWN STRL REUS W/ TWL LRG LVL3 (GOWN DISPOSABLE) IMPLANT
GOWN STRL REUS W/ TWL XL LVL3 (GOWN DISPOSABLE) IMPLANT
GOWN STRL REUS W/TWL LRG LVL3 (GOWN DISPOSABLE)
GOWN STRL REUS W/TWL XL LVL3 (GOWN DISPOSABLE)
HANDLE YANKAUER SUCT BULB TIP (MISCELLANEOUS) IMPLANT
HEMOSTAT SURGICEL 2X3 (HEMOSTASIS) IMPLANT
IV NS 500ML (IV SOLUTION)
IV NS 500ML BAXH (IV SOLUTION) IMPLANT
KIT CATH CVC 3 LUMEN 7FR 8IN (MISCELLANEOUS) IMPLANT
KIT RM TURNOVER STRD PROC AR (KITS) IMPLANT
LABEL OR SOLS (LABEL) IMPLANT
LOOP RED MAXI  1X406MM (MISCELLANEOUS)
LOOP VESSEL MAXI 1X406 RED (MISCELLANEOUS) IMPLANT
NDL SAFETY 18GX1.5 (NEEDLE) IMPLANT
NS IRRIG 500ML POUR BTL (IV SOLUTION) IMPLANT
PACK BASIN MAJOR ARMC (MISCELLANEOUS) IMPLANT
PACK UNIVERSAL (MISCELLANEOUS) IMPLANT
RETAINER VISCERA MED (MISCELLANEOUS) IMPLANT
SPONGE LAP 18X18 5 PK (GAUZE/BANDAGES/DRESSINGS) ×4 IMPLANT
STAPLER SKIN PROX 35W (STAPLE) IMPLANT
SUT PDS AB 1 TP1 96 (SUTURE) IMPLANT
SUT PROLENE 2 0 SH DA (SUTURE) IMPLANT
SUT PROLENE 6 0 BV (SUTURE) IMPLANT
SUT SILK 0 (SUTURE)
SUT SILK 0 30XBRD TIE 6 (SUTURE) IMPLANT
SUT SILK 2 0 (SUTURE)
SUT SILK 2 0 SH (SUTURE) IMPLANT
SUT SILK 2-0 30XBRD TIE 12 (SUTURE) IMPLANT
SUT SILK 3 0 (SUTURE)
SUT SILK 3-0 18XBRD TIE 12 (SUTURE) IMPLANT
SUT SILK 4 0 (SUTURE)
SUT SILK 4-0 18XBRD TIE 12 (SUTURE) IMPLANT
SUT VIC AB 0 CT1 36 (SUTURE) IMPLANT
SUT VIC AB 2-0 CT1 (SUTURE) IMPLANT
SYR 20CC LL (SYRINGE) IMPLANT
SYR 3ML LL SCALE MARK (SYRINGE) IMPLANT
SYR BULB IRRIG 60ML STRL (SYRINGE) IMPLANT
TAPE UMBIL 1/8X18 RADIOPA (MISCELLANEOUS) IMPLANT

## 2016-02-08 SURGICAL SUPPLY — 47 items
BALLN ARMADA 14X60X80 (BALLOONS) ×6
BALLOON ARMADA 14X60X80 (BALLOONS) ×2 IMPLANT
BLADE SURG 15 STRL LF DISP TIS (BLADE) ×1 IMPLANT
BLADE SURG 15 STRL SS (BLADE) ×2
BLADE SURG SZ11 CARB STEEL (BLADE) ×3 IMPLANT
BRUSH SCRUB 4% CHG (MISCELLANEOUS) ×3 IMPLANT
CATH 5FR REUT (CATHETERS) ×3 IMPLANT
CATH ACCU-VU SIZ PIG 5F 70CM (CATHETERS) ×3 IMPLANT
CATH BALLN CODA 9X100X32 (BALLOONS) ×3 IMPLANT
CATH C2 65CM (CATHETERS) ×3 IMPLANT
CATH KA2 5FR 65CM (CATHETERS) ×3 IMPLANT
CATH RIM 65CM (CATHETERS) ×3 IMPLANT
DEVICE CLOSURE PERCLS PRGLD 6F (VASCULAR PRODUCTS) ×5 IMPLANT
DEVICE PRESTO INFLATION (MISCELLANEOUS) ×6 IMPLANT
DEVICE TORQUE (MISCELLANEOUS) ×3 IMPLANT
DRYSEAL FLEXSHEATH 12FR 33CM (SHEATH) ×2
DRYSEAL FLEXSHEATH 18FR 33CM (SHEATH) ×2
ELECT CAUTERY BLADE 6.4 (BLADE) ×3 IMPLANT
ELECT REM PT RETURN 9FT ADLT (ELECTROSURGICAL) ×3
ELECTRODE REM PT RTRN 9FT ADLT (ELECTROSURGICAL) ×1 IMPLANT
EXCLUDER TNK 28.5X14.5X18CM (Endovascular Graft) ×1 IMPLANT
EXCLUDER TRUNK 28.5X14.5X18CM (Endovascular Graft) ×3 IMPLANT
GLIDEWIRE STIFF .35X180X3 HYDR (WIRE) ×3 IMPLANT
GRAFT EXCLUDER AORTIC 26X3.3CM (Endovascular Graft) ×3 IMPLANT
GRAFT EXCLUDER AORTIC 28X3.3CM (Endovascular Graft) ×6 IMPLANT
GRAFT EXCLUDER LEG (Endovascular Graft) ×3 IMPLANT
GRAFT EXCLUDER LEG 14.5X14 (Endovascular Graft) ×3 IMPLANT
GUIDEWIRE ANGLED .035 180CM (WIRE) ×3 IMPLANT
GUIDEWIRE ANGLED .035X260CM (WIRE) ×3 IMPLANT
IV NS 1000ML (IV SOLUTION) ×2
IV NS 1000ML BAXH (IV SOLUTION) ×1 IMPLANT
LEG CONTRALATERAL 16X18X9.5 (Endovascular Graft) ×3 IMPLANT
LIQUID BAND (GAUZE/BANDAGES/DRESSINGS) ×3 IMPLANT
PACK ANGIOGRAPHY (CUSTOM PROCEDURE TRAY) ×3 IMPLANT
PACK BASIN MAJOR ARMC (MISCELLANEOUS) ×3 IMPLANT
PAD GROUND ADULT SPLIT (MISCELLANEOUS) ×3 IMPLANT
PENCIL ELECTRO HAND CTR (MISCELLANEOUS) ×3 IMPLANT
PERCLOSE PROGLIDE 6F (VASCULAR PRODUCTS) ×15
SHEATH BRITE TIP 6FRX11 (SHEATH) ×6 IMPLANT
SHEATH BRITE TIP 8FRX11 (SHEATH) ×6 IMPLANT
SHEATH DRYSEAL FLEX 12FR 33CM (SHEATH) ×1 IMPLANT
SHEATH DRYSEAL FLEX 18FR 33CM (SHEATH) ×1 IMPLANT
SYR 20CC LL (SYRINGE) ×3 IMPLANT
SYR MEDRAD MARK V 150ML (SYRINGE) ×3 IMPLANT
TUBING CONTRAST HIGH PRESS 72 (TUBING) ×3 IMPLANT
WIRE AMPLATZ SSTIFF .035X260CM (WIRE) ×6 IMPLANT
WIRE J 3MM .035X145CM (WIRE) ×9 IMPLANT

## 2016-02-08 NOTE — ED Notes (Addendum)
Pt placed on nonrebreather.  

## 2016-02-08 NOTE — Anesthesia Preprocedure Evaluation (Addendum)
Anesthesia Evaluation  Patient identified by MRN, date of birth, ID band Patient confused    Reviewed: Allergy & Precautions, Patient's Chart, lab work & pertinent test results  History of Anesthesia Complications Negative for: history of anesthetic complications  Airway Mallampati: III       Dental  (+) Poor Dentition, Missing, Chipped   Pulmonary neg pulmonary ROS, sleep apnea (not using CPAP) , former smoker,           Cardiovascular hypertension (pt not compliant with meds),      Neuro/Psych    GI/Hepatic negative GI ROS, Neg liver ROS,   Endo/Other  negative endocrine ROS  Renal/GU negative Renal ROS     Musculoskeletal   Abdominal   Peds  Hematology negative hematology ROS (+) anemia ,   Anesthesia Other Findings   Reproductive/Obstetrics                            Anesthesia Physical Anesthesia Plan  ASA: IV and emergent  Anesthesia Plan: General   Post-op Pain Management:    Induction: Intravenous and Rapid sequence  Airway Management Planned: Oral ETT  Additional Equipment: Arterial line  Intra-op Plan:   Post-operative Plan:   Informed Consent: I have reviewed the patients History and Physical, chart, labs and discussed the procedure including the risks, benefits and alternatives for the proposed anesthesia with the patient or authorized representative who has indicated his/her understanding and acceptance.   Plan/risks discussed with: history from wife and daughter, they were made aware of the high risk of this procedure and desire to procede.  Plan Discussed with:   Anesthesia Plan Comments:         Anesthesia Quick Evaluation

## 2016-02-08 NOTE — H&P (Signed)
[facility] Admission History & Physical  MRN : [id number]  Larry Buckley is a 75 y.o. (13-Apr-1941) male who presents with chief complaint of  Chief Complaint  Patient presents with  . Back Pain  .  History of Present Illness: Patient presents to the emergency department with a several-day history of back pain which became excruciating today. He has a history of high blood pressure and high cholesterol but not currently on any medications. He had no known previous history of abdominal aortic aneurysm. I am asked by Dr. Dondra Spry in the emergency department to see the patient as a CT scan has demonstrated a ruptured greater than 9 cm abdominal aortic aneurysm. I have independently reviewed this scan and this demonstrates a short infrarenal neck of about 11 cm, but there is a neck which may allow a stent graft placement. There is active extravasation. The patient has had bouts of hypotension in the ER but on my examination his pressure is 121/81. He is lethargic and provides little history. His family provides much of the history for him.  No current facility-administered medications for this encounter.   No current outpatient prescriptions on file.    Past Medical History  Diagnosis Date  . Hyperlipidemia   . Hypertension   . Gout, arthropathy     History reviewed. No pertinent past surgical history.  Social History Social History  Substance Use Topics  . Smoking status: Former Games developer  . Smokeless tobacco: None  . Alcohol Use: Yes  no IVDU Married, wife present today  Family History Family History  Problem Relation Age of Onset  . Cancer Mother   . Diabetes Mother   . Heart disease Father   . Hypertension Father   . Heart disease Brother   no aneurysms  Allergies  Allergen Reactions  . Lotensin [Benazepril Hcl] Swelling     REVIEW OF SYSTEMS (Negative unless checked)  Constitutional: Weight loss  Fever  Chills Cardiac: Chest pain    Chest pressure   Palpitations   Shortness of breath when laying flat   Shortness of breath at rest   Shortness of breath with exertion. Vascular:  Pain in legs with walking   Pain in legs at rest   Pain in legs when laying flat   Claudication   Pain in feet when walking  Pain in feet at rest  Pain in feet when laying flat   History of DVT   Phlebitis   Swelling in legs   Varicose veins   Non-healing ulcers Pulmonary:   Uses home oxygen   Productive cough   Hemoptysis   Wheeze  COPD   Asthma Neurologic:  Dizziness  Blackouts   Seizures   History of stroke   History of TIA  Aphasia   Temporary blindness   Dysphagia   Weakness or numbness in arms   Weakness or numbness in legs Musculoskeletal:  Arthritis   Joint swelling   Joint pain   Low back pain Hematologic:  Easy bruising  Easy bleeding   Hypercoagulable state   Anemic  Hepatitis Gastrointestinal:  Blood in stool   Vomiting blood  Gastroesophageal reflux/heartburn   Difficulty swallowing. Genitourinary:  Chronic kidney disease   Difficult urination  Frequent urination  Burning with urination   Blood in urine Skin:  Rashes   Ulcers   Wounds Psychological:  History of anxiety    History of major depression.  Physical Examination  Filed Vitals:   02/08/16 1819 02/08/16 1900  02/08/16 1916 02/08/16 1920  BP: 141/96 155/79 121/82 140/89  Pulse:  100 102 102  Temp:  98 F (36.7 C) 96 F (35.6 C) 96 F (35.6 C)  TempSrc:  Oral Axillary Axillary  Resp: 27 19 29 21   Height:      Weight:      SpO2:  96% 94% 93%   Body mass index is 24.38 kg/(m^2). Gen: WD/WN, patient in extremis Head: /AT, No temporalis wasting. Prominent temp pulse not noted. Ear/Nose/Throat: Hearing grossly intact, nares w/o erythema or drainage, oropharynx w/o Erythema/Exudate,  Eyes: PERRLA, EOMI.  Neck: Supple, no nuchal rigidity.  No JVD.  Pulmonary:  Good  air movement, diminished bilaterally Cardiac: RRR, normal S1, S2, no Murmurs, rubs or gallops. Vascular:  Vessel Right Left  Radial Palpable Palpable  Ulnar Palpable Palpable  Brachial Palpable Palpable  Carotid Palpable, without bruit Palpable, without bruit  Aorta Palpable aortic aneruysm N/A  Femoral Palpable Palpable  Popliteal Not Palpable Not Palpable  PT Not Palpable Not Palpable  DP Not Palpable Not Palpable   Gastrointestinal: distended, tender to palpation, pulsatile mass Musculoskeletal:   No LE edema.  No deformity or atrophy.  Neurologic: CN 2-12 intact. Difficult to assess due to poor mental status Psychiatric: Patient lethargic, moaning in pain Dermatologic: No rashes or ulcers noted.  No cellulitis or open wounds. Lymph : No Cervical, Axillary, or Inguinal lymphadenopathy.    CBC Lab Results  Component Value Date   WBC 15.5* 02/08/2016   HGB 13.7 02/08/2016   HCT 41.1 02/08/2016   MCV 97.9 02/08/2016   PLT 332 02/08/2016    BMET    Component Value Date/Time   NA 137 02/08/2016 1604   K 3.1* 02/08/2016 1604   CL 100* 02/08/2016 1604   CO2 24 02/08/2016 1604   GLUCOSE 216* 02/08/2016 1604   BUN 10 02/08/2016 1604   CREATININE 1.08 02/08/2016 1604   CALCIUM 9.1 02/08/2016 1604   GFRNONAA >60 02/08/2016 1604   GFRAA >60 02/08/2016 1604   Estimated Creatinine Clearance: 68.7 mL/min (by C-G formula based on Cr of 1.08).  COAG Lab Results  Component Value Date   INR 1.06 02/08/2016    Radiology Dg Lumbar Spine 2-3 Views  02/08/2016  CLINICAL DATA:  Low back pain for 1 week EXAM: LUMBAR SPINE - 3 VIEW COMPARISON:  None. FINDINGS: Five lumbar type vertebral bodies are well visualized. Vertebral body height is well maintained. Osteophytic changes are noted throughout the lumbar spine. No anterolisthesis is seen. Aortic calcifications are noted. There calcifications significantly displaced to the left of the midline raising suspicion for abdominal aortic  aneurysm. Correlation with the physical exam is recommended. CT would be helpful for further evaluation. IMPRESSION: Calcifications arising from the aorta which appears significantly displaced from the midline. This may be related to a tortuous aorta although the possibility of an aortic aneurysm could not be totally excluded. CT would be helpful for further evaluation. Electronically Signed   By: Alcide CleverMark  Lukens M.D.   On: 02/08/2016 17:37   Dg Chest Portable 1 View  02/08/2016  CLINICAL DATA:  Increasing pain. EXAM: PORTABLE CHEST 1 VIEW COMPARISON:  None. FINDINGS: Normal heart size. Decreased lung volumes. No pleural effusion or edema. No airspace consolidation. IMPRESSION: 1. Low lung volumes. Electronically Signed   By: Signa Kellaylor  Stroud M.D.   On: 02/08/2016 18:29   Ct Cta Abd/pel W/cm &/or W/o Cm  02/08/2016  CLINICAL DATA:  Increasing back pain EXAM: CTA ABDOMEN AND PELVIS wITHOUT  AND WITH CONTRAST TECHNIQUE: Multidetector CT imaging of the abdomen and pelvis was performed using the standard protocol during bolus administration of intravenous contrast. Multiplanar reconstructed images and MIPs were obtained and reviewed to evaluate the vascular anatomy. CONTRAST:  100 mL Isovue 370. COMPARISON:  Plain film from earlier in the same day. FINDINGS: The lung bases demonstrate some minimal dependent atelectatic changes. No sizable effusion is seen. There is evidence of a large abdominal aortic aneurysm which measures at least 8.9 by 8.8 cm in greatest AP and transverse dimensions respectively. There is considerable mural thrombus identified within the aneurysm however at its most superior aspect just below the renal arteries there is evidence of active extravasation posteriorly. On the delayed images increasing passage of contrast material posteriorly is noted. Considerable changes are noted surrounding the aorta consistent with recent extravasation. These appear more subacute in nature given the acute  hemorrhage seen on the current exam. The infrarenal neck a short measuring approximately 11 mm. Heavy calcification is noted in the infrarenal aorta as well. 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 Considerable collateralization from the superior mesenteric artery to the celiac axis branches is noted. The inferior mesenteric artery is patent arising from the anterior aspect of the aortic aneurysm. There is aneurysmal dilatation of the right common iliac artery to 3.1 cm. The remainder the iliac vessels although somewhat calcified are otherwise within normal limits. The liver, spleen, adrenal glands and pancreas are within normal limits. The gallbladder is well distended. The kidneys demonstrate a normal enhancement pattern without obstructive changes. As previously described, there is considerable fluid attenuation within the retroperitoneal structures consistent with prior extravasation from the aneurysm. No free pelvic fluid is seen. The bladder is well distended. The appendix is within normal limits. Scattered diverticular change is noted without diverticulitis. Bony structures are within normal limits for the patient's given age. Review of the MIP images confirms the above findings. IMPRESSION: Large abdominal aortic aneurysm with evidence of rupture and active extravasation during the exam. There also changes of prior extravasation in the retroperitoneum. The infrarenal neck a short at 11 mm. Right common iliac artery aneurysm. Other chronic changes as described Critical Value/emergent results were called by telephone by Dr. Richarda Overlie at the time of interpretation on 02/08/2016 at 6:20 pm to Dr. Toney Rakes , who verbally acknowledged these results. Electronically Signed   By: Alcide Clever M.D.   On: 02/08/2016 18:38      Assessment/Plan 1. Ruptured abdominal aortic aneurysm with bleeding. The patient is in extremis. Emergency surgery will be his only chance of  survival, but the likelihood of survival was quite small and this was discussed with the family. They desired immediate attempted repair in hopes of saving his life. It was discussed that even if we are able to get him through surgery, renal failure, cardiopulmonary complications, abdominal compartment syndrome and other issues may arise. They voiced their understanding and desires to proceed with emergency surgery. We are going to attempt to repair this with a stent graft as I believe this would be his only chance of survival at this point. 2. Hypertension. Likely an underlying cause of his abdominal aortic aneurysm. Currently more concerned about hypotension. 3. Hyperlipidemia.   Michaela Broski, MD  02/08/2016 7:21 PM

## 2016-02-08 NOTE — ED Provider Notes (Signed)
[facility] Emergency Department Provider Note  ____________________________________________  Time seen: Approximately 3:51 PM  I have reviewed the triage vital signs and the nursing notes.   HISTORY  Chief Complaint Back Pain    HPI Larry Buckley is a 75 y.o. male with hypertension, hyperlipidemia, gout, alcoholism presents for evaluation of 3-4 days atraumatic bilateral lumbar back pain worse today, gradual onset, constant since onset, worse with movement, currently moderate. Patient reports he took some ibuprofen today however after that he had one episode of vomiting. No vomiting since then or before that. No abdominal pain, no chest pain, difficulty breathing, diarrhea, fevers or chills. No numbness or weakness in the legs. No history of malignancy, no fevers, no bowel or bladder incontinence, denies saddle anesthesia. He reports to me that he drinks multiple beers daily, is unable to quantify how many he typically consumes in a day. He denies any history of cocaine withdrawals. Thus far, today, he has only had 3 or 4 beers.    Past Medical History  Diagnosis Date  . Hyperlipidemia   . Hypertension   . Gout, arthropathy     Patient Active Problem List   Diagnosis Date Noted  . Hyperlipidemia   . Hypertension   . Gout, arthropathy     History reviewed. No pertinent past surgical history.  No current outpatient prescriptions on file.  Allergies Lotensin  Family History  Problem Relation Age of Onset  . Cancer Mother   . Diabetes Mother   . Heart disease Father   . Hypertension Father   . Heart disease Brother     Social History Social History  Substance Use Topics  . Smoking status: Former Games developer  . Smokeless tobacco: None  . Alcohol Use: Yes    Review of Systems Constitutional: No fever/chills Eyes: No visual changes. ENT: No sore throat. Cardiovascular: Denies chest pain. Respiratory: Denies shortness of  breath. Gastrointestinal: No abdominal pain.  No nausea, no vomiting.  No diarrhea.  No constipation. Genitourinary: Negative for dysuria. Musculoskeletal: Positive for back pain. Skin: Negative for rash. Neurological: Negative for headaches, focal weakness or numbness.  10-point ROS otherwise negative.  ____________________________________________   PHYSICAL EXAM:  VITAL SIGNS: ED Triage Vitals  Enc Vitals Group     BP 02/08/16 1547 112/61 mmHg     Pulse Rate 02/08/16 1547 80     Resp 02/08/16 1547 20     Temp 02/08/16 1547 98 F (36.7 C)     Temp Source 02/08/16 1547 Oral     SpO2 02/08/16 1547 97 %     Weight 02/08/16 1547 190 lb (86.183 kg)     Height 02/08/16 1547  (1.88 m)     Head Cir --      Peak Flow --      Pain Score 02/08/16 1548 6     Pain Loc --      Pain Edu? --      Excl. in GC? --     Constitutional: Alert and oriented. Generally well -appearing and in no acute distress, pleasant, talkative but he is mildly diaphoretic.. Eyes: Conjunctivae are normal. PERRL. EOMI. Head: Atraumatic. Nose: No congestion/rhinnorhea. Mouth/Throat: Mucous membranes are moist.  Oropharynx non-erythematous. Neck: No stridor.  No cervical spine tenderness to palpation. Cardiovascular: Normal rate, regular rhythm. Grossly normal heart sounds.  Good peripheral circulation. Respiratory: Normal respiratory effort.  No retractions. Lungs CTAB. Gastrointestinal: Soft and nontender. No distention.  No CVA tenderness. Genitourinary: deferred Musculoskeletal: No  lower extremity tenderness nor edema.  No joint effusions. No midline T or L-spine tender to palpation. There is mild tenderness to palpation in the paravertebral muscles associated with the lumbar spine bilaterally. Neurologic:  Normal speech and language. No gross focal neurologic deficits are appreciated. No gait instability. Normal sensation in the lateral thighs, no saddle anesthesia. 5 out of 5 strength in bilateral  lower extremities including 5 out of 5 strength of dorsiflexion and the hallux toes bilaterally. Skin:  Skin is warm, dry and intact. No rash noted. Psychiatric: Mood and affect are normal. Speech and behavior are normal.  ____________________________________________   LABS (all labs ordered are listed, but only abnormal results are displayed)  Labs Reviewed  CBC WITH DIFFERENTIAL/PLATELET - Abnormal; Notable for the following:    WBC 15.5 (*)    RBC 4.20 (*)    Neutro Abs 12.2 (*)    All other components within normal limits  COMPREHENSIVE METABOLIC PANEL - Abnormal; Notable for the following:    Potassium 3.1 (*)    Chloride 100 (*)    Glucose, Bld 216 (*)    ALT 11 (*)    All other components within normal limits  ETHANOL  TROPONIN I  PROTIME-INR  APTT  URINALYSIS COMPLETEWITH MICROSCOPIC ([facility] ONLY)  TYPE AND SCREEN  PREPARE RBC (CROSSMATCH)  ABO/RH  PREPARE PLATELET PHERESIS   ____________________________________________  EKG  ED ECG REPORT I, Gayla DossGayle, Nayda Riesen A, the attending physician, personally viewed and interpreted this ECG.   Date: 02/08/2016  EKG Time: 15:48  Rate: 81  Rhythm: normal sinus rhythm  Axis: normal  Intervals:none  ST&T Change: No acute ST elevation. Mild ST depression in lead 2, V5, V6. No ST elevation.  ____________________________________________  RADIOLOGY  Xray lumbar spine IMPRESSION: Calcifications arising from the aorta which appears significantly displaced from the midline. This may be related to a tortuous aorta although the possibility of an aortic aneurysm could not be totally excluded. CT would be helpful for further evaluation.  CTA abdomen and pelvis IMPRESSION: Large abdominal aortic aneurysm with evidence of rupture and active extravasation during the exam. There also changes of prior extravasation in the retroperitoneum. The infrarenal neck a short at 11 mm.  Right common iliac artery aneurysm.  Other chronic  changes as described  Critical Value/emergent results were called by telephone by Dr. Richarda OverlieAdam Henn at the time of interpretation on 02/08/2016 at 6:20 pm to Dr. Toney RakesERYKA Kerrie Latour , who verbally acknowledged these results.   CXR  IMPRESSION: 1. Low lung volumes. ____________________________________________   PROCEDURES  Procedure(s) performed: None  Critical Care performed: Total critical care time spent 60 minutes.  ____________________________________________   INITIAL IMPRESSION / ASSESSMENT AND PLAN / ED COURSE  Pertinent labs & imaging results that were available during my care of the patient were reviewed by me and considered in my medical decision making (see chart for details).  Larry Buckley is a 75 y.o. male with hypertension, hyperlipidemia, gout, alcoholism presents for evaluation of 2 days atraumatic bilateral lumbar back pain. On exam, he is generally well-appearing, in no distress but he does have mild diaphoresis. He has some tenderness in the paravertebral muscles associated with the lumbar spine bilaterally. He has an intact neurological examination, no red flags concerning for cauda equina or epidural abscess. Diaphoresis may be pain related, also question onset of alcohol withdrawal. Initial CIWA score is 15. His EKG shows some very mild ST depression in the lateral leads without any prior available for comparison. He  adamantly denies any chest pain or difficulty breathing but we will check a troponin, check basic labs, urinalysis obtain plain films and treat his pain. Reassess for disposition and need for advanced imaging.  ----------------------------------------- 6:26 PM on 02/08/2016 ----------------------------------------- CT scan shows large ruptured AAA. I discussed the case with Dr. Wyn Quaker of vascular surgery who will evaluate the patient. Blood pressure currently 141/96.  ----------------------------------------- 6:43 PM on  02/08/2016 ----------------------------------------- Patient is suddenly hypotensive, blood pressure 74/53, poor mentation. I have consented patient and wife for blood transfusion, ordered emergency release blood, will transfuse.  IV fluids ordered. Dr. Wyn Quaker made aware.  ----------------------------------------- 7:07 PM on 02/08/2016 ----------------------------------------- Blood pressure improving, systolic is now in the 140s, patient is more awake, alert. Dr. Wyn Quaker at bedside and will take to OR. ____________________________________________   FINAL CLINICAL IMPRESSION(S) / ED DIAGNOSES  Final diagnoses:  Bilateral low back pain without sciatica  Ruptured abdominal aortic aneurysm (AAA) (HCC)      Gayla Doss, MD 02/08/16 2330

## 2016-02-08 NOTE — ED Notes (Signed)
Rapid infuser used for blood. Second blood bag started

## 2016-02-08 NOTE — ED Notes (Signed)
Patient transported to CT 

## 2016-02-08 NOTE — ED Notes (Signed)
Reports lower back pain onset today.  Pt diaphoretic, a&o

## 2016-02-08 NOTE — Transfer of Care (Signed)
Immediate Anesthesia Transfer of Care Note  Patient: Larry Buckley  Procedure(s) Performed: Procedure(s): ANEURYSM ABDOMINAL AORTIC REPAIR (N/A)  Patient Location: PACU  Anesthesia Type:General  Level of Consciousness: Patient remains intubated per anesthesia plan  Airway & Oxygen Therapy: Patient remains intubated per anesthesia plan and Patient placed on Ventilator (see vital sign flow sheet for setting)  Post-op Assessment: Report given to RN and Post -op Vital signs reviewed and stable  Post vital signs: Reviewed and stable  Last Vitals:  Filed Vitals:   02/08/16 1920 02/08/16 1930  BP: 140/89 106/74  Pulse: 102 94  Temp: 35.6 C   Resp: 21 16    Last Pain:  Filed Vitals:   02/08/16 2319  PainSc: Asleep         Complications: No apparent anesthesia complications

## 2016-02-08 NOTE — Op Note (Signed)
OPERATIVE NOTE   PROCEDURE: 1. US guidance for vascular access, bilateral femoral arteries 2. Catheter placement into aorta from bilateral femoral approaches 3. Placement of a 28 mm diameter proximal 18 cm length Gore Excluder Endoprosthesis main body right with a 14 mm diameter by 10 cm length left contralateral limb 4. Placement of an 18 mm diameter by 10 cm length left iliac limb extender 5. Placement of a 14 mm diameter by 14 cm length right iliac limb extender 6. Placement of 3 proximal aortic cuff extenders to gain proximal seal using two 28 mm diameter cuffs and one 26 mm diameter cuff. 7. ProGlide closure devices bilateral femoral arteries  PRE-OPERATIVE DIAGNOSIS: ruptured AAA with hypotensive shock  POST-OPERATIVE DIAGNOSIS: same  SURGEON: Festus Barren, MD   ANESTHESIA: general  ESTIMATED BLOOD LOSS: 100 cc  FINDING(S): 1.  AAA  SPECIMEN(S):  None  FLUORO TIME: 44.1 minutes  CONTRAST: 120 cc  INDICATIONS:   Larry Buckley is a 75 y.o. male who presents with low back pain and is found to have a ruptured abdominal aortic aneurysm on a CT scan. He was hypotensive in the ER with systolic blood pressures of 70 and mental status changes. He is brought in for an attempt at an emergent repair although the family understands this is a grave entire situation and he is not expected to survive. Risks and benefits of this emergent procedure were discussed and informed consent was obtained.  DESCRIPTION: After obtaining full informed written consent, the patient was brought back to the operating room and placed supine upon the operating table.  After obtaining adequate anesthesia, the patient was prepped and draped in the standard fashion for endovascular AAA repair.  We then began by gaining access to both femoral arteries with US guidance.  The femoral arteries were found to be patent and accessed without difficulty with a needle under ultrasound guidance without difficulty on  each side and permanent images were recorded.  I then placed 2 proglide devices on each side in a pre-close fashion and placed 8 [place] sheaths. The patient was then given only 3000 units of intravenous heparin due to the rupture situation. The Pigtail catheter was placed into the aorta from the  right side. Using this image, we selected a 28 mm diameter proximal 18 cm length Main body device.  Over a stiff wire, an 34 French sheath was placed up the right side. The main body was then placed through the 18 French sheath. A Kumpe catheter was placed up the left side and a magnified image at the renal arteries was performed with steep cranial projection due to his highly angulated neck. The main body was then deployed just below the lowest renal artery which was the left although they were nearly equal in their takeoff. The Kumpe catheter, rim catheter, and ultimately a VS 1 catheter was used to cannulate the contralateral gate with a moderate amount of difficulty and successful cannulation was confirmed by twirling the pigtail catheter in the main body. We then placed a stiff wire and a retrograde arteriogram was performed through the left femoral sheath. We upsized to the 12 [place] sheath on the left for the contralateral limb and a 14 mm diameter by 10 cm length limb was selected and deployed. The length was not going to be long enough to matter what device was used, and an extender was required on the left. An 18 mm diameter by 10 cm length left iliac extender was taken down  to within 1 cm of the left hypogastric artery. The main body deployment was then completed. Based off the angiographic findings, extension limbs were necessary.  His right common iliac artery was largely aneurysmal. Due to a very severe takeoff of the hypogastric artery, we were unable to successfully cannulate this and gain access to coil the hypogastric artery. This was reasonably small compared to the left, and in a emergent ruptured  situation we elected to extend beyond this to cover this area when it was clear we were not going to be able successfully cannulate the hypogastric artery on the right. A 14 mm diameter by 14 cm length right iliac extension limb was required and went to the mid right external iliac artery to gain seal. . All junction points and seals zones were treated with the compliant balloon. The pigtail catheter was then replaced and a completion angiogram was performed.   A proximal type I Endoleak was detected on completion angiography. The renal arteries were found to be widely patent. The left hypogastric artery was found to be widely patent. We had chosen to cover the right hypogastric artery to gain seal and a ruptured aneurysm with the patient with continued hypotension. We then turned our attention to dealing with the proximal type I endoleak. He had a very difficult angulated neck, but given the ruptured situation a stent graft repair was still going to be his best option for survival. The original stent graft had come down almost a centimeter from the renal arteries. I initially started by extending a 28 mm diameter cuff proximally. Due to the angled conical neck, a seal was still not obtained proximally and this slipped down about 5 mm below the renal arteries. A 26 mm diameter proximal cuff was then taken to the base of the renal arteries bilaterally covering 1-2 mm of the renal artery on each side. The renal arteries were large and without stenosis, so greater than 60% of the flow lumen still remained widely patent. This resulted in good proximal coverage without type I endoleak, but now with a 26 mm diameter cuff proximally there was a type III endoleak at the bottom of the 26 mm cuff. A third aortic cuff was then required using a 28 mm diameter cuff to bridge from the midportion of the 26 mm cuff down to the original main body device. The compliant balloon was used with a slight endoleak still present. At this  point, I selected two 14 mm diameter noncompliant balloons through both femoral sheaths in a kissing balloon fashion to iron out the cuffs. This resulted in sealing proximally with no type I or 3 endoleak remaining. At this point we elected to terminate the procedure. We secured the pro glide devices for hemostasis on the femoral arteries. A third pro glide device was required in the right femoral artery but good hemostasis was achieved with the third device. The skin incision was closed with a 4-0 Monocryl. Dermabond and pressure dressing were placed. The patient was taken to the recovery room in stable condition having tolerated the procedure well.  COMPLICATIONS: none  CONDITION: stable  Neomi Laidler  02/08/2016, 10:53 PM

## 2016-02-08 NOTE — Anesthesia Procedure Notes (Signed)
Procedure Name: Intubation Date/Time: 02/08/2016 8:14 PM Performed by: Waldo LaineJUSTIS, Allean Montfort Pre-anesthesia Checklist: Patient identified, Emergency Drugs available, Suction available, Patient being monitored and Timeout performed Patient Re-evaluated:Patient Re-evaluated prior to inductionOxygen Delivery Method: Circle system utilized Preoxygenation: Pre-oxygenation with 100% oxygen Intubation Type: IV induction and Rapid sequence Laryngoscope Size: Miller and 2 Grade View: Grade III Number of attempts: 2 Airway Equipment and Method: Stylet Placement Confirmation: ETT inserted through vocal cords under direct vision,  positive ETCO2 and breath sounds checked- equal and bilateral Secured at: 22 cm Tube secured with: Tape Dental Injury: Teeth and Oropharynx as per pre-operative assessment

## 2016-02-09 DIAGNOSIS — J9600 Acute respiratory failure, unspecified whether with hypoxia or hypercapnia: Secondary | ICD-10-CM

## 2016-02-09 DIAGNOSIS — I713 Abdominal aortic aneurysm, ruptured: Principal | ICD-10-CM

## 2016-02-09 LAB — TYPE AND SCREEN
ABO/RH(D): A POS
ANTIBODY SCREEN: NEGATIVE
UNIT DIVISION: 0
UNIT DIVISION: 0
UNIT DIVISION: 0
Unit division: 0

## 2016-02-09 LAB — BASIC METABOLIC PANEL WITH GFR
Anion gap: 12 (ref 5–15)
BUN: 19 mg/dL (ref 6–20)
CO2: 18 mmol/L — ABNORMAL LOW (ref 22–32)
Calcium: 7.5 mg/dL — ABNORMAL LOW (ref 8.9–10.3)
Chloride: 109 mmol/L (ref 101–111)
Creatinine, Ser: 1.47 mg/dL — ABNORMAL HIGH (ref 0.61–1.24)
GFR calc Af Amer: 52 mL/min — ABNORMAL LOW (ref >60)
GFR calc non Af Amer: 45 mL/min — ABNORMAL LOW (ref >60)
Glucose, Bld: 121 mg/dL — ABNORMAL HIGH (ref 65–99)
Potassium: 5.3 mmol/L — ABNORMAL HIGH (ref 3.5–5.1)
Sodium: 139 mmol/L (ref 135–145)

## 2016-02-09 LAB — BLOOD GAS, ARTERIAL
ACID-BASE DEFICIT: 4.8 mmol/L — AB (ref 0.0–2.0)
ALLENS TEST (PASS/FAIL): POSITIVE — AB
Acid-base deficit: 5.7 mmol/L — ABNORMAL HIGH (ref 0.0–2.0)
Allens test (pass/fail): POSITIVE — AB
Bicarbonate: 19.7 mEq/L — ABNORMAL LOW (ref 21.0–28.0)
Bicarbonate: 19.4 mEq/L — ABNORMAL LOW (ref 21.0–28.0)
FIO2: 0.4
FIO2: 0.3
Mechanical Rate: 15
O2 SAT: 99.4 %
O2 Saturation: 97.9 %
PATIENT TEMPERATURE: 37
PCO2 ART: 34 mmHg (ref 32.0–48.0)
PCO2 ART: 36 mmHg (ref 32.0–48.0)
PEEP/CPAP: 5 cmH2O
PEEP: 5 cmH2O
PH ART: 7.37 (ref 7.350–7.450)
PO2 ART: 157 mmHg — AB (ref 83.0–108.0)
Patient temperature: 37
Pressure support: 5 cmH2O
VT: 600 mL
pH, Arterial: 7.34 — ABNORMAL LOW (ref 7.350–7.450)
pO2, Arterial: 109 mmHg — ABNORMAL HIGH (ref 83.0–108.0)

## 2016-02-09 LAB — BASIC METABOLIC PANEL
ANION GAP: 8 (ref 5–15)
Anion gap: 10 (ref 5–15)
BUN: 12 mg/dL (ref 6–20)
BUN: 15 mg/dL (ref 6–20)
CALCIUM: 7 mg/dL — AB (ref 8.9–10.3)
CHLORIDE: 107 mmol/L (ref 101–111)
CO2: 20 mmol/L — ABNORMAL LOW (ref 22–32)
CO2: 20 mmol/L — ABNORMAL LOW (ref 22–32)
CREATININE: 0.96 mg/dL (ref 0.61–1.24)
Calcium: 7.6 mg/dL — ABNORMAL LOW (ref 8.9–10.3)
Chloride: 107 mmol/L (ref 101–111)
Creatinine, Ser: 1.16 mg/dL (ref 0.61–1.24)
GFR calc Af Amer: >60 mL/min (ref >60)
GFR calc non Af Amer: >60 mL/min (ref >60)
GFR, EST NON AFRICAN AMERICAN: 60 mL/min — AB (ref >60)
GLUCOSE: 123 mg/dL — AB (ref 65–99)
GLUCOSE: 144 mg/dL — AB (ref 65–99)
POTASSIUM: 4.9 mmol/L (ref 3.5–5.1)
Potassium: 5.3 mmol/L — ABNORMAL HIGH (ref 3.5–5.1)
SODIUM: 137 mmol/L (ref 135–145)
Sodium: 135 mmol/L (ref 135–145)

## 2016-02-09 LAB — CBC
HCT: 40.1 % (ref 40.0–52.0)
HCT: 34.4 % — ABNORMAL LOW (ref 40.0–52.0)
HCT: 33.6 % — ABNORMAL LOW (ref 40.0–52.0)
Hemoglobin: 13.5 g/dL (ref 13.0–18.0)
Hemoglobin: 11.7 g/dL — ABNORMAL LOW (ref 13.0–18.0)
Hemoglobin: 11.5 g/dL — ABNORMAL LOW (ref 13.0–18.0)
MCH: 31 pg (ref 26.0–34.0)
MCH: 31 pg (ref 26.0–34.0)
MCH: 31.4 pg (ref 26.0–34.0)
MCHC: 33.6 g/dL (ref 32.0–36.0)
MCHC: 34 g/dL (ref 32.0–36.0)
MCHC: 34.2 g/dL (ref 32.0–36.0)
MCV: 91.2 fL (ref 80.0–100.0)
MCV: 91.8 fL (ref 80.0–100.0)
MCV: 92.2 fL (ref 80.0–100.0)
PLATELETS: 117 10*3/uL — AB (ref 150–440)
PLATELETS: 139 10*3/uL — AB (ref 150–440)
Platelets: 116 K/uL — ABNORMAL LOW (ref 150–440)
RBC: 4.35 MIL/uL — ABNORMAL LOW (ref 4.40–5.90)
RBC: 3.77 MIL/uL — ABNORMAL LOW (ref 4.40–5.90)
RBC: 3.66 MIL/uL — ABNORMAL LOW (ref 4.40–5.90)
RDW: 17.9 % — AB (ref 11.5–14.5)
RDW: 17.9 % — AB (ref 11.5–14.5)
RDW: 18 % — ABNORMAL HIGH (ref 11.5–14.5)
WBC: 14.2 10*3/uL — ABNORMAL HIGH (ref 3.8–10.6)
WBC: 10.2 10*3/uL (ref 3.8–10.6)
WBC: 9.8 K/uL (ref 3.8–10.6)

## 2016-02-09 LAB — MRSA PCR SCREENING: MRSA BY PCR: NEGATIVE

## 2016-02-09 LAB — PROTIME-INR
INR: 1.2
INR: 1.33
Prothrombin Time: 15.4 seconds — ABNORMAL HIGH (ref 11.4–15.0)
Prothrombin Time: 16.6 seconds — ABNORMAL HIGH (ref 11.4–15.0)

## 2016-02-09 MED ORDER — FENTANYL CITRATE (PF) 100 MCG/2ML IJ SOLN
50.0000 ug | Freq: Once | INTRAMUSCULAR | Status: AC
  Administered 2016-02-09: 50 ug via INTRAVENOUS

## 2016-02-09 MED ORDER — FAMOTIDINE IN NACL 20-0.9 MG/50ML-% IV SOLN
20.0000 mg | Freq: Two times a day (BID) | INTRAVENOUS | Status: DC
Start: 2016-02-09 — End: 2016-02-14
  Administered 2016-02-09 – 2016-02-13 (×11): 20 mg via INTRAVENOUS
  Filled 2016-02-09 (×14): qty 50

## 2016-02-09 MED ORDER — ANTISEPTIC ORAL RINSE SOLUTION (CORINZ)
7.0000 mL | Freq: Four times a day (QID) | OROMUCOSAL | Status: DC
  Administered 2016-02-09 – 2016-02-12 (×6): 7 mL via OROMUCOSAL
  Filled 2016-02-09 (×17): qty 7

## 2016-02-09 MED ORDER — SODIUM CHLORIDE 0.9 % IV SOLN: INTRAVENOUS | Status: DC

## 2016-02-09 MED ORDER — CHLORHEXIDINE GLUCONATE 0.12% ORAL RINSE (MEDLINE KIT)
15.0000 mL | Freq: Two times a day (BID) | OROMUCOSAL | Status: DC
  Administered 2016-02-09 – 2016-02-10 (×3): 15 mL via OROMUCOSAL
  Filled 2016-02-09 (×9): qty 15

## 2016-02-09 MED ORDER — FENTANYL BOLUS VIA INFUSION
25.0000 ug | INTRAVENOUS | Status: DC | PRN
  Filled 2016-02-09: qty 25

## 2016-02-09 MED ORDER — MIDAZOLAM HCL 2 MG/2ML IJ SOLN: 1.0000 mg | INTRAMUSCULAR | Status: DC | PRN

## 2016-02-09 MED ORDER — FENTANYL 2500MCG IN NS 250ML (10MCG/ML) PREMIX INFUSION
25.0000 ug/h | INTRAVENOUS | Status: DC
  Administered 2016-02-09: 50 ug/h via INTRAVENOUS
  Filled 2016-02-09: qty 250

## 2016-02-09 NOTE — Progress Notes (Signed)
PATIENT EXTUBATED PER DR. RAM ORDER.  PATIENT SUCTIONED PRIOR TO EXTUBATION.  PLACED ON 2LPM Braxton POST EXTUBATION, PATIENT TOLERATING WELL.  WILL CONTINUE TO MONITOR.

## 2016-02-09 NOTE — Consult Note (Signed)
[facility] [place] [facility]   PATIENT NAME: Larry Buckley    MR#:  [id number]  DATE OF BIRTH:  07-20-1941  DATE OF ADMISSION:  02/08/2016  PRIMARY CARE PHYSICIAN: no pcp previously mark crissman about 3-5 years ago   REQUESTING/REFERRING PHYSICIAN: dew  CHIEF COMPLAINT:   Chief Complaint  Patient presents with  . Back Pain   Reason for consult: medical management post AAA rupture  HISTORY OF PRESENT ILLNESS:  Larry Buckley  is a 75 y.o. male with a known history of essential hypertension, hyperlipidemia unspecified noncompliant with medication last taking blood pressure medications about 3 years ago. Nutrition presented to the hospital 02/08/2016 with symptoms of back pain. During workup found to have ruptured aortic aneurysm and underwent emergent repair with vascular surgery. He is currently being seen in the intensive care unit, extubated earlier today. Patient is presently without complaints at this time, a little drowsy, family at bedside  PAST MEDICAL HISTORY:   Past Medical History  Diagnosis Date  . Hyperlipidemia   . Hypertension   . Gout, arthropathy     PAST SURGICAL HISTOIRY:  History reviewed. No pertinent past surgical history.  SOCIAL HISTORY:   Social History  Substance Use Topics  . Smoking status: Former Games developermoker  . Smokeless tobacco: Not on file  . Alcohol Use: Yes    FAMILY HISTORY:   Family History  Problem Relation Age of Onset  . Cancer Mother   . Diabetes Mother   . Heart disease Father   . Hypertension Father   . Heart disease Brother     DRUG ALLERGIES:   Allergies  Allergen Reactions  . Lotensin [Benazepril Hcl] Swelling    REVIEW OF SYSTEMS:  CONSTITUTIONAL: No fever, positive fatigue or weakness.  EYES: No blurred or double vision.  EARS, NOSE, AND THROAT: No tinnitus or ear pain.  RESPIRATORY: No cough, shortness of breath, wheezing or hemoptysis.  CARDIOVASCULAR: No chest pain, orthopnea, edema.   GASTROINTESTINAL: No nausea, vomiting, diarrhea or abdominal pain.  GENITOURINARY: No dysuria, hematuria.  ENDOCRINE: No polyuria, nocturia,  HEMATOLOGY: No anemia, easy bruising or bleeding SKIN: No rash or lesion. MUSCULOSKELETAL: No joint pain or arthritis.   NEUROLOGIC: No tingling, numbness, weakness.  PSYCHIATRY: No anxiety or depression.   MEDICATIONS AT HOME:   Prior to Admission medications   Not on File      VITAL SIGNS:  Blood pressure 117/79, pulse 90, temperature 99.1 F (37.3 C), temperature source Axillary, resp. rate 21, height 6\' 2"  (1.88 m), weight 91.7 kg (202 lb 2.6 oz), SpO2 98 %.  PHYSICAL EXAMINATION:  GENERAL:  75 y.o.-year-old patient lying in the bed with no acute distress.  EYES: Pupils equal, round, reactive to light and accommodation. No scleral icterus. Extraocular muscles intact.  HEENT: Head atraumatic, normocephalic. Oropharynx and nasopharynx clear.  NECK:  Supple, no jugular venous distention. No thyroid enlargement, no tenderness.  LUNGS: Normal breath sounds bilaterally, no wheezing, rales,rhonchi or crepitation. No use of accessory muscles of respiration.  CARDIOVASCULAR: S1, S2 normal. No murmurs, rubs, or gallops.  ABDOMEN: Soft, nontender, minimal distention. Hypoactive Bowel sounds present. No organomegaly or mass.  EXTREMITIES: No pedal edema, cyanosis, or clubbing.  NEUROLOGIC: Cranial nerves II through XII are intact. Muscle strength 5/5 in all extremities. Sensation intact. Gait not checked.  PSYCHIATRIC: The patient is alert and oriented x 3.  SKIN: No obvious rash, lesion, or ulcer.   LABORATORY PANEL:   CBC  Recent Labs Lab  02/09/16 1430  WBC 9.8  HGB 11.5*  HCT 33.6*  PLT 116*   ------------------------------------------------------------------------------------------------------------------  Chemistries   Recent Labs Lab 02/08/16 1604  02/09/16 1430  NA 137  < > 139  K 3.1*  < > 5.3*  CL 100*  < > 109  CO2 24   < > 18*  GLUCOSE 216*  < > 121*  BUN 10  < > 19  CREATININE 1.08  < > 1.47*  CALCIUM 9.1  < > 7.5*  AST 18  --   --   ALT 11*  --   --   ALKPHOS 105  --   --   BILITOT 1.0  --   --   < > = values in this interval not displayed. ------------------------------------------------------------------------------------------------------------------  Cardiac Enzymes  Recent Labs Lab 02/08/16 1604  TROPONINI <0.03   ------------------------------------------------------------------------------------------------------------------  RADIOLOGY:  Dg Lumbar Spine 2-3 Views  02/08/2016  CLINICAL DATA:  Low back pain for 1 week EXAM: LUMBAR SPINE - 3 VIEW COMPARISON:  None. FINDINGS: Five lumbar type vertebral bodies are well visualized. Vertebral body height is well maintained. Osteophytic changes are noted throughout the lumbar spine. No anterolisthesis is seen. Aortic calcifications are noted. There calcifications significantly displaced to the left of the midline raising suspicion for abdominal aortic aneurysm. Correlation with the physical exam is recommended. CT would be helpful for further evaluation. IMPRESSION: Calcifications arising from the aorta which appears significantly displaced from the midline. This may be related to a tortuous aorta although the possibility of an aortic aneurysm could not be totally excluded. CT would be helpful for further evaluation. Electronically Signed   By: Alcide Clever M.D.   On: 02/08/2016 17:37   Dg Chest Port 1 View  02/09/2016  CLINICAL DATA:  Post intubation. EXAM: PORTABLE CHEST 1 VIEW COMPARISON:  Earlier this day at 1810 hour FINDINGS: Endotracheal tube 4.6 cm from the carina. Low lung volumes persist. Increasing bibasilar atelectasis. Cardiomediastinal contours are unchanged. No pneumothorax or large pleural effusion. IMPRESSION: 1. Endotracheal tube 4.6 cm from the carina. 2. Increasing bibasilar atelectasis. Electronically Signed   By: Rubye Oaks  M.D.   On: 02/09/2016 00:09   Dg Chest Portable 1 View  02/08/2016  CLINICAL DATA:  Increasing pain. EXAM: PORTABLE CHEST 1 VIEW COMPARISON:  None. FINDINGS: Normal heart size. Decreased lung volumes. No pleural effusion or edema. No airspace consolidation. IMPRESSION: 1. Low lung volumes. Electronically Signed   By: Signa Kell M.D.   On: 02/08/2016 18:29   Ct Cta Abd/pel W/cm &/or W/o Cm  02/08/2016  CLINICAL DATA:  Increasing back pain EXAM: CTA ABDOMEN AND PELVIS wITHOUT AND WITH CONTRAST TECHNIQUE: Multidetector CT imaging of the abdomen and pelvis was performed using the standard protocol during bolus administration of intravenous contrast. Multiplanar reconstructed images and MIPs were obtained and reviewed to evaluate the vascular anatomy. CONTRAST:  100 mL Isovue 370. COMPARISON:  Plain film from earlier in the same day. FINDINGS: The lung bases demonstrate some minimal dependent atelectatic changes. No sizable effusion is seen. There is evidence of a large abdominal aortic aneurysm which measures at least 8.9 by 8.8 cm in greatest AP and transverse dimensions respectively. There is considerable mural thrombus identified within the aneurysm however at its most superior aspect just below the renal arteries there is evidence of active extravasation posteriorly. On the delayed images increasing passage of contrast material posteriorly is noted. Considerable changes are noted surrounding the aorta consistent with recent extravasation. These appear more  subacute in nature given the acute hemorrhage seen on the current exam. The infrarenal neck a short measuring approximately 11 mm. Heavy calcification is noted in the infrarenal aorta as well. 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 Considerable collateralization from the superior mesenteric artery to the celiac axis branches is noted. The inferior mesenteric artery is patent arising from the anterior aspect  of the aortic aneurysm. There is aneurysmal dilatation of the right common iliac artery to 3.1 cm. The remainder the iliac vessels although somewhat calcified are otherwise within normal limits. The liver, spleen, adrenal glands and pancreas are within normal limits. The gallbladder is well distended. The kidneys demonstrate a normal enhancement pattern without obstructive changes. As previously described, there is considerable fluid attenuation within the retroperitoneal structures consistent with prior extravasation from the aneurysm. No free pelvic fluid is seen. The bladder is well distended. The appendix is within normal limits. Scattered diverticular change is noted without diverticulitis. Bony structures are within normal limits for the patient's given age. Review of the MIP images confirms the above findings. IMPRESSION: Large abdominal aortic aneurysm with evidence of rupture and active extravasation during the exam. There also changes of prior extravasation in the retroperitoneum. The infrarenal neck a short at 11 mm. Right common iliac artery aneurysm. Other chronic changes as described Critical Value/emergent results were called by telephone by Dr. Richarda Overlie at the time of interpretation on 02/08/2016 at 6:20 pm to Dr. Toney Rakes , who verbally acknowledged these results. Electronically Signed   By: Alcide Clever M.D.   On: 02/08/2016 18:38    EKG:   Orders placed or performed during the hospital encounter of 02/08/16  . EKG 12-Lead  . EKG 12-Lead    IMPRESSION AND PLAN:   75 year old African-American gentleman history of essential hypertension noncompliant with medication who was originally admitted to the hospital 02/08/2016 with ruptured abdominal aortic aneurysm  1. Ruptured aortic aneurysm status post repair: Care per primary service-vascular surgery, follow CBC trend 2. Acute kidney injury: Would not be surprised if patient develops acute tubular necrosis, continue IV fluid hydration  and follow urine output and renal function if continued upward trend in creatinine would increase IV fluid hydration rate 3. Hyperkalemia: Follow BMP continue hydration if worsening greater than 5.5 would treat 4. Suspect ileus: Hypoactive bowel sounds agree with keeping patient nothing by mouth for now patient currently asymptomatic if no further symptoms agree with trial of clear liquid tomorrow 5. Essential hypertension: On no home medications would hold on further antihypertensives for now given labile blood pressure/blood loss-patient will likely require chronic medication upon discharge 6. Hyperlipidemia unspecified: Check lipid panel    All the records are reviewed and case discussed with Consulting provider. Management plans discussed with the patient, family and they are in agreement.  CODE STATUS: full  TOTAL TIME TAKING CARE OF THIS PATIENT: 40 minutes.    Murl Zogg,  Mardi Mainland.D on 02/09/2016 at 4:18 PM  Between 7am to 6pm - Pager - [phone]  After 6pm: House Pager: - [phone]  [facility]  Office  [phone]  CC: Primary care Physician: No primary care provider on file.    ho

## 2016-02-09 NOTE — Progress Notes (Signed)
[facility]  Daily Progress Note   Subjective  - 1 Day Post-Op Awake and alert.  Extubated and tolerating well so far Says he doesn't really have much pain at this point.  Objective Filed Vitals:   02/09/16 0400 02/09/16 0500 02/09/16 0600 02/09/16 0700  BP: 100/57 103/58 112/80 105/60  Pulse: 80 80 76 75  Temp: 99 F (37.2 C) 99.5 F (37.5 C) 99.3 F (37.4 C)   TempSrc:      Resp: 15 15 15 13   Height:      Weight:      SpO2: 100% 100% 100% 100%    Intake/Output Summary (Last 24 hours) at 02/09/16 1103 Last data filed at 02/09/16 0538  Gross per 24 hour  Intake 2427.5 ml  Output   1200 ml  Net 1227.5 ml    PULM  CTAB CV  RRR VASC  Abdomen distended, but not tight and not that tender  Laboratory CBC    Component Value Date/Time   WBC 10.2 02/09/2016 0549   HGB 11.7* 02/09/2016 0549   HCT 34.4* 02/09/2016 0549   PLT 117* 02/09/2016 0549    BMET    Component Value Date/Time   NA 135 02/09/2016 0549   K 5.3* 02/09/2016 0549   CL 107 02/09/2016 0549   CO2 20* 02/09/2016 0549   GLUCOSE 123* 02/09/2016 0549   BUN 15 02/09/2016 0549   CREATININE 1.16 02/09/2016 0549   CALCIUM 7.0* 02/09/2016 0549   GFRNONAA 60* 02/09/2016 0549   GFRAA >60 02/09/2016 0549    Assessment/Planning: POD #1 s/p ruptured AAA repair   Doing well.  Better than would be expected for his amount of blood loss  Extubated.  Has a very large amount of retroperitoneal blood, and would expect ileus with delayed return of bowel function.  Needs to be strict NPO today.  May consider liquids tomorrow  Abdomen is distended but not tight or tense.  Seems to be breathing comfortably, but needs IS use and monitoring after extubation.    Making good urine.  Not a lot of pain.  Do not believe any abdominal compartment syndrome is present.  Check bmp later today.  Monitor Hgb, but no signs of active bleeding    DEW,JASON  02/09/2016, 11:03 AM

## 2016-02-09 NOTE — Progress Notes (Signed)
Patient alert but confused to situation at times. Extubated this am and placed on 2 liters [place]. No complaints of pain. Vitals wnl. Bilateral Groins intact with no bleeding or hematoma. Palpable pedals. Patient is NPO.

## 2016-02-09 NOTE — Progress Notes (Signed)
Dr Dimas AguasHoward aware of patients second potassium level being 5.3. At this point no orders given and he will re check in the am.

## 2016-02-09 NOTE — Progress Notes (Signed)
[facility] Consultation      Name: Larry Buckley MRN: [id number] DOB: [birth year]    ADMISSION DATE:  02/08/2016    CHIEF COMPLAINT:   Acute shock, resp failure   HISTORY OF PRESENT ILLNESS  75 yo male admitted to ICU s/p ruptured AAA, with post op resp failure and shock Patient intubated on full vent support Sedated, ABG and CXR pending    SIGNIFICANT EVENTS   4/25 >>AAA repaair   PAST MEDICAL HISTORY    :  Past Medical History  Diagnosis Date  . Hyperlipidemia   . Hypertension   . Gout, arthropathy    History reviewed. No pertinent past surgical history. Prior to Admission medications   Not on File   Allergies  Allergen Reactions  . Lotensin [Benazepril Hcl] Swelling     FAMILY HISTORY   Family History  Problem Relation Age of Onset  . Cancer Mother   . Diabetes Mother   . Heart disease Father   . Hypertension Father   . Heart disease Brother       SOCIAL HISTORY    reports that he has quit smoking. He does not have any smokeless tobacco history on file. He reports that he drinks alcohol. He reports that he does not use illicit drugs.  Review of Systems  Unable to perform ROS: critical illness      VITAL SIGNS    Temp:  [96 F (35.6 C)-98 F (36.7 C)] 96 F (35.6 C) (04/25 1920) Pulse Rate:  [66-104] 94 (04/25 1930) Resp:  [11-32] 16 (04/25 1930) BP: (66-177)/(45-114) 106/74 mmHg (04/25 1930) SpO2:  [89 %-100 %] 100 % (04/25 2310) FiO2 (%):  [50 %] 50 % (04/25 2349) Weight:  [190 lb (86.183 kg)-202 lb 2.6 oz (91.7 kg)] 202 lb 2.6 oz (91.7 kg) (04/25 2349) HEMODYNAMICS:   VENTILATOR SETTINGS: Vent Mode:  [-] PRVC FiO2 (%):  [50 %] 50 % Vt Set:  [600 mL] 600 mL PEEP:  [5 cmH20] 5 cmH20 INTAKE / OUTPUT:  Intake/Output Summary (Last 24 hours) at 02/09/16 0005 Last data filed at 02/08/16 2225  Gross per 24 hour  Intake   1000 ml  Output    400 ml  Net    600 ml       PHYSICAL EXAM   Physical  Exam  Constitutional: He appears distressed.  HENT:  Head: Normocephalic and atraumatic.  Eyes: Pupils are equal, round, and reactive to light. No scleral icterus.  Neck: Normal range of motion. Neck supple.  Cardiovascular: Normal rate and regular rhythm.   No murmur heard. Pulmonary/Chest: No respiratory distress. He has no wheezes. He has rales.  Abdominal: Soft. He exhibits no distension. There is no tenderness.  Musculoskeletal: He exhibits no edema.  Neurological: He displays normal reflexes. Coordination normal.  gcs<8T  Skin: Skin is warm. No rash noted. He is diaphoretic.       LABS   LABS:  CBC  Recent Labs Lab 02/08/16 1604  WBC 15.5*  HGB 13.7  HCT 41.1  PLT 332   Coag's  Recent Labs Lab 02/08/16 1604  APTT 26  INR 1.06   BMET  Recent Labs Lab 02/08/16 1604  NA 137  K 3.1*  CL 100*  CO2 24  BUN 10  CREATININE 1.08  GLUCOSE 216*   Electrolytes  Recent Labs Lab 02/08/16 1604  CALCIUM 9.1   Sepsis Markers No results for input(s): LATICACIDVEN, PROCALCITON, O2SATVEN in the last 168 hours. ABG No  results for input(s): PHART, PCO2ART, PO2ART in the last 168 hours. Liver Enzymes  Recent Labs Lab 02/08/16 1604  AST 18  ALT 11*  ALKPHOS 105  BILITOT 1.0  ALBUMIN 3.5   Cardiac Enzymes  Recent Labs Lab 02/08/16 1604  TROPONINI <0.03   Glucose No results for input(s): GLUCAP in the last 168 hours.   No results found for this or any previous visit (from the past 240 hour(s)).   Current facility-administered medications:  .  0.9 %  sodium chloride infusion, , Intravenous, Continuous, Algernon Huxley, MD .  0.9 %  sodium chloride infusion, 500 mL, Intravenous, Once PRN, Algernon Huxley, MD .  acetaminophen (TYLENOL) tablet 325-650 mg, 325-650 mg, Oral, Q4H PRN **OR** acetaminophen (TYLENOL) suppository 325-650 mg, 325-650 mg, Rectal, Q4H PRN, Algernon Huxley, MD .  alum & mag hydroxide-simeth (MAALOX/MYLANTA) 200-200-20 MG/5ML suspension  15-30 mL, 15-30 mL, Oral, Q2H PRN, Algernon Huxley, MD .  cefUROXime (ZINACEF) 1.5 g in dextrose 5 % 50 mL IVPB, 1.5 g, Intravenous, Q12H, Algernon Huxley, MD .  docusate sodium (COLACE) capsule 100 mg, 100 mg, Oral, Daily, Algernon Huxley, MD .  DOPamine (INTROPIN) 800 mg in dextrose 5 % 250 mL (3.2 mg/mL) infusion, 3-5 mcg/kg/min, Intravenous, Continuous, Algernon Huxley, MD .  famotidine (PEPCID) IVPB 20 mg premix, 20 mg, Intravenous, Q12H, Algernon Huxley, MD .  famotidine (PEPCID) IVPB 20 mg premix, 20 mg, Intravenous, Q12H, Flora Lipps, MD .  fentaNYL (SUBLIMAZE) bolus via infusion 25 mcg, 25 mcg, Intravenous, Q1H PRN, Flora Lipps, MD .  fentaNYL (SUBLIMAZE) injection 50 mcg, 50 mcg, Intravenous, Once, Flora Lipps, MD .  fentaNYL 2576mg in NS 2551m(1036mml) infusion-PREMIX, 25-400 mcg/hr, Intravenous, Continuous, KurFlora LippsD .  guaiFENesin-dextromethorphan (ROBITUSSIN DM) 100-10 MG/5ML syrup 15 mL, 15 mL, Oral, Q4H PRN, JasAlgernon HuxleyD .  hydrALAZINE (APRESOLINE) injection 5 mg, 5 mg, Intravenous, Q20 Min PRN, JasAlgernon HuxleyD .  labetalol (NORMODYNE,TRANDATE) injection 10 mg, 10 mg, Intravenous, Q10 min PRN, JasAlgernon HuxleyD .  magnesium sulfate IVPB 2 g 50 mL, 2 g, Intravenous, Daily PRN, JasAlgernon HuxleyD .  mannitol 25 % injection 21.55 g, 0.25 g/kg, Intravenous, Once, JasAlgernon HuxleyD .  metoprolol (LOPRESSOR) injection 2-5 mg, 2-5 mg, Intravenous, Q2H PRN, JasAlgernon HuxleyD .  midazolam (VERSED) injection 1 mg, 1 mg, Intravenous, Q15 min PRN, KurFlora LippsD .  midazolam (VERSED) injection 1 mg, 1 mg, Intravenous, Q2H PRN, KurFlora LippsD .  midazolam (VERSED) injection 2 mg, 2 mg, Intravenous, Q1H PRN, JasAlgernon HuxleyD .  morphine 2 MG/ML injection 2-5 mg, 2-5 mg, Intravenous, Q1H PRN, JasAlgernon HuxleyD .  nitroGLYCERIN 50 mg in dextrose 5 % 250 mL (0.2 mg/mL) infusion, 5-250 mcg/min, Intravenous, Titrated, JasAlgernon HuxleyD .  ondansetron (ZOFRAN) injection 4 mg, 4 mg, Intravenous, Q6H PRN, JasAlgernon HuxleyMD .  oxyCODONE-acetaminophen (PERCOCET/ROXICET) 5-325 MG per tablet 1-2 tablet, 1-2 tablet, Oral, Q4H PRN, JasAlgernon HuxleyD .  phenol (CHLORASEPTIC) mouth spray 1 spray, 1 spray, Mouth/Throat, PRN, JasAlgernon HuxleyD .  potassium chloride SA (K-DUR,KLOR-CON) CR tablet 20-40 mEq, 20-40 mEq, Oral, Daily PRN, JasAlgernon HuxleyD  IMAGING    Dg Lumbar Spine 2-3 Views  02/08/2016  CLINICAL DATA:  Low back pain for 1 week EXAM: LUMBAR SPINE - 3 VIEW COMPARISON:  None. FINDINGS: Five lumbar type vertebral bodies are well  visualized. Vertebral body height is well maintained. Osteophytic changes are noted throughout the lumbar spine. No anterolisthesis is seen. Aortic calcifications are noted. There calcifications significantly displaced to the left of the midline raising suspicion for abdominal aortic aneurysm. Correlation with the physical exam is recommended. CT would be helpful for further evaluation. IMPRESSION: Calcifications arising from the aorta which appears significantly displaced from the midline. This may be related to a tortuous aorta although the possibility of an aortic aneurysm could not be totally excluded. CT would be helpful for further evaluation. Electronically Signed   By: Inez Catalina M.D.   On: 02/08/2016 17:37   Dg Chest Portable 1 View  02/08/2016  CLINICAL DATA:  Increasing pain. EXAM: PORTABLE CHEST 1 VIEW COMPARISON:  None. FINDINGS: Normal heart size. Decreased lung volumes. No pleural effusion or edema. No airspace consolidation. IMPRESSION: 1. Low lung volumes. Electronically Signed   By: Kerby Moors M.D.   On: 02/08/2016 18:29   Ct Cta Abd/pel W/cm &/or W/o Cm  02/08/2016  CLINICAL DATA:  Increasing back pain EXAM: CTA ABDOMEN AND PELVIS wITHOUT AND WITH CONTRAST TECHNIQUE: Multidetector CT imaging of the abdomen and pelvis was performed using the standard protocol during bolus administration of intravenous contrast. Multiplanar reconstructed images and MIPs were obtained and  reviewed to evaluate the vascular anatomy. CONTRAST:  100 mL Isovue 370. COMPARISON:  Plain film from earlier in the same day. FINDINGS: The lung bases demonstrate some minimal dependent atelectatic changes. No sizable effusion is seen. There is evidence of a large abdominal aortic aneurysm which measures at least 8.9 by 8.8 cm in greatest AP and transverse dimensions respectively. There is considerable mural thrombus identified within the aneurysm however at its most superior aspect just below the renal arteries there is evidence of active extravasation posteriorly. On the delayed images increasing passage of contrast material posteriorly is noted. Considerable changes are noted surrounding the aorta consistent with recent extravasation. These appear more subacute in nature given the acute hemorrhage seen on the current exam. The infrarenal neck a short measuring approximately 11 mm. Heavy calcification is noted in the infrarenal aorta as well. 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 Considerable collateralization from the superior mesenteric artery to the celiac axis branches is noted. The inferior mesenteric artery is patent arising from the anterior aspect of the aortic aneurysm. There is aneurysmal dilatation of the right common iliac artery to 3.1 cm. The remainder the iliac vessels although somewhat calcified are otherwise within normal limits. The liver, spleen, adrenal glands and pancreas are within normal limits. The gallbladder is well distended. The kidneys demonstrate a normal enhancement pattern without obstructive changes. As previously described, there is considerable fluid attenuation within the retroperitoneal structures consistent with prior extravasation from the aneurysm. No free pelvic fluid is seen. The bladder is well distended. The appendix is within normal limits. Scattered diverticular change is noted without diverticulitis. Bony structures are  within normal limits for the patient's given age. Review of the MIP images confirms the above findings. IMPRESSION: Large abdominal aortic aneurysm with evidence of rupture and active extravasation during the exam. There also changes of prior extravasation in the retroperitoneum. The infrarenal neck a short at 11 mm. Right common iliac artery aneurysm. Other chronic changes as described Critical Value/emergent results were called by telephone by Dr. Markus Daft at the time of interpretation on 02/08/2016 at 6:20 pm to Dr. Loura Pardon , who verbally acknowledged these results. Electronically Signed  By: Inez Catalina M.D.   On: 02/08/2016 18:38      Indwelling Urinary Catheter continued, requirement due to   Reason to continue Indwelling Urinary Catheter for strict Intake/Output monitoring for hemodynamic instability         Ventilator continued, requirement due to, resp failure    Ventilator Sedation RASS 0 to -2   MICRO DATA: MRSA PCR  Urine  Blood Resp   ANTIMICROBIALS:     ASSESSMENT/PLAN  75 yo male admitted to ICU for acute post op resp failure from shock s/p AAA rupture and repair  PULMONARY -Respiratory Failure -continue Full MV support -continue Bronchodilator Therapy -Wean Fio2 and PEEP as tolerated -will perform SAT/SBt when respiratory parameters are met -follow up ABG and CXR  CARDIOVASCULAR Shock-voluem related -IVF's as needed -start vasopressors to keep MAP>65 if needed  RENAL Follow UO Follow up chem 7  GASTROINTESTINAL Keep NPO GI prx   HEMATOLOGIC Follow CBC    NEUROLOGIC - intubated and sedated - minimal sedation to achieve a RASS goal: -1    I have personally obtained a history, examined the patient, evaluated laboratory and independently reviewed  imaging results, formulated the assessment and plan and placed orders.  The Patient requires high complexity decision making for assessment and support, frequent evaluation and titration of  therapies, application of advanced monitoring technologies and extensive interpretation of multiple databases. Critical Care Time devoted to patient care services described in this note is 45 minutes.   Overall, patient is critically ill, prognosis is guarded.   Corrin Parker, M.D.  [facility]  Medical Director [place] Director [facility] Cardio-Pulmonary Department

## 2016-02-09 NOTE — Progress Notes (Signed)
   [facility] [address] [place], [place] [address]  February 09, 2016  Patient:  Larry Buckley Date of Birth: 03-19-1941 Date of Visit:  02/08/2016  To Whom it May Concern:   Please excuse Deanna ArtisWilliam H Catlin's wife: Magda BernheimCarrie Abril from obligations (including flight) from 02/08/2016 until 02/13/2016 as  her husband  is admitted to the [facility] for medical treatment and she is present at bedside  throughout his care. Please don't hesitate to contact me with questions or concerns by calling [phone] and asking  them to page me directly.   Marge Duncansave Demara Lover, MD

## 2016-02-09 NOTE — Anesthesia Postprocedure Evaluation (Signed)
Anesthesia Post Note  Patient: Larry Buckley  Procedure(s) Performed: Procedure(s) (LRB): Endovascular Repair/Stent Graft (N/A)  Patient location during evaluation: ICU Anesthesia Type: General Level of consciousness: sedated, responds to stimulation and patient remains intubated per anesthesia plan Pain management: pain level controlled Vital Signs Assessment: post-procedure vital signs reviewed and stable Respiratory status: patient on ventilator - see flowsheet for VS Cardiovascular status: stable Anesthetic complications: no Comments: ICU plans to extubate today..    Last Vitals:  Filed Vitals:   02/09/16 0600 02/09/16 0700  BP: 112/80 105/60  Pulse: 76 75  Temp: 37.4 C   Resp: 15 13    Last Pain:  Filed Vitals:   02/09/16 0758  PainSc: Asleep                 Jeily Guthridge,  Sheran FavaMark R

## 2016-02-10 ENCOUNTER — Encounter: Payer: Self-pay | Admitting: Vascular Surgery

## 2016-02-10 DIAGNOSIS — R410 Disorientation, unspecified: Secondary | ICD-10-CM

## 2016-02-10 DIAGNOSIS — I959 Hypotension, unspecified: Secondary | ICD-10-CM

## 2016-02-10 LAB — CBC
HCT: 30.4 % — ABNORMAL LOW (ref 40.0–52.0)
Hemoglobin: 10.3 g/dL — ABNORMAL LOW (ref 13.0–18.0)
MCH: 31.3 pg (ref 26.0–34.0)
MCHC: 33.8 g/dL (ref 32.0–36.0)
MCV: 92.6 fL (ref 80.0–100.0)
PLATELETS: 131 10*3/uL — AB (ref 150–440)
RBC: 3.28 MIL/uL — AB (ref 4.40–5.90)
RDW: 17.8 % — ABNORMAL HIGH (ref 11.5–14.5)
WBC: 9.4 10*3/uL (ref 3.8–10.6)

## 2016-02-10 LAB — LIPID PANEL
CHOL/HDL RATIO: 3.4 ratio
Cholesterol: 104 mg/dL (ref 0–200)
HDL: 31 mg/dL — ABNORMAL LOW (ref >40)
LDL Cholesterol: 58 mg/dL (ref 0–99)
Triglycerides: 74 mg/dL (ref <150)
VLDL: 15 mg/dL (ref 0–40)

## 2016-02-10 LAB — BASIC METABOLIC PANEL
Anion gap: 8 (ref 5–15)
BUN: 19 mg/dL (ref 6–20)
CHLORIDE: 110 mmol/L (ref 101–111)
CO2: 24 mmol/L (ref 22–32)
Calcium: 8 mg/dL — ABNORMAL LOW (ref 8.9–10.3)
Creatinine, Ser: 1.12 mg/dL (ref 0.61–1.24)
GFR calc Af Amer: >60 mL/min (ref >60)
GFR calc non Af Amer: >60 mL/min (ref >60)
GLUCOSE: 118 mg/dL — AB (ref 65–99)
POTASSIUM: 4.8 mmol/L (ref 3.5–5.1)
Sodium: 142 mmol/L (ref 135–145)

## 2016-02-10 MED ORDER — DEXMEDETOMIDINE HCL IN NACL 400 MCG/100ML IV SOLN
0.4000 ug/kg/h | INTRAVENOUS | Status: DC
  Administered 2016-02-11: 1.2 ug/kg/h via INTRAVENOUS
  Filled 2016-02-10: qty 100

## 2016-02-10 MED ORDER — ADULT MULTIVITAMIN W/MINERALS CH
1.0000 | ORAL_TABLET | Freq: Every day | ORAL | Status: DC
  Administered 2016-02-11 – 2016-02-17 (×7): 1 via ORAL
  Filled 2016-02-10 (×8): qty 1

## 2016-02-10 MED ORDER — FUROSEMIDE 10 MG/ML IJ SOLN
20.0000 mg | Freq: Once | INTRAMUSCULAR | Status: AC
  Administered 2016-02-10: 20 mg via INTRAVENOUS
  Filled 2016-02-10: qty 2

## 2016-02-10 MED ORDER — FOLIC ACID 1 MG PO TABS: 1.0000 mg | ORAL_TABLET | Freq: Every day | ORAL | Status: DC

## 2016-02-10 MED ORDER — LORAZEPAM 2 MG/ML IJ SOLN: 1.0000 mg | Freq: Four times a day (QID) | INTRAMUSCULAR | Status: DC | PRN

## 2016-02-10 MED ORDER — THIAMINE HCL 100 MG/ML IJ SOLN
100.0000 mg | Freq: Every day | INTRAMUSCULAR | Status: DC
  Administered 2016-02-10: 100 mg via INTRAVENOUS
  Filled 2016-02-10 (×2): qty 2

## 2016-02-10 MED ORDER — LORAZEPAM 0.5 MG PO TABS: 1.0000 mg | ORAL_TABLET | Freq: Four times a day (QID) | ORAL | Status: DC | PRN

## 2016-02-10 MED ORDER — LORAZEPAM 2 MG/ML IJ SOLN: 0.0000 mg | Freq: Four times a day (QID) | INTRAMUSCULAR | Status: DC

## 2016-02-10 MED ORDER — LORAZEPAM 2 MG/ML IJ SOLN
2.0000 mg | Freq: Once | INTRAMUSCULAR | Status: AC
  Administered 2016-02-10: 2 mg via INTRAVENOUS

## 2016-02-10 MED ORDER — SODIUM CHLORIDE 0.9 % IV SOLN
1.0000 mg | Freq: Every day | INTRAVENOUS | Status: DC
  Administered 2016-02-10 – 2016-02-11 (×2): 1 mg via INTRAVENOUS
  Filled 2016-02-10 (×3): qty 0.2

## 2016-02-10 MED ORDER — LORAZEPAM 2 MG/ML IJ SOLN
2.0000 mg | INTRAMUSCULAR | Status: DC | PRN
  Administered 2016-02-10 (×3): 3 mg via INTRAVENOUS
  Administered 2016-02-11: 1 mg via INTRAVENOUS
  Administered 2016-02-11: 2 mg via INTRAVENOUS
  Filled 2016-02-10: qty 1
  Filled 2016-02-10: qty 2
  Filled 2016-02-10: qty 1
  Filled 2016-02-10: qty 2
  Filled 2016-02-10: qty 1
  Filled 2016-02-10: qty 2

## 2016-02-10 MED ORDER — LORAZEPAM 2 MG/ML IJ SOLN
2.0000 mg | Freq: Once | INTRAMUSCULAR | Status: AC
  Administered 2016-02-10: 2 mg via INTRAVENOUS
  Filled 2016-02-10: qty 1

## 2016-02-10 MED ORDER — VITAMIN B-1 100 MG PO TABS
100.0000 mg | ORAL_TABLET | Freq: Every day | ORAL | Status: DC
  Administered 2016-02-11 – 2016-02-17 (×7): 100 mg via ORAL
  Filled 2016-02-10 (×8): qty 1

## 2016-02-10 MED ORDER — LORAZEPAM 2 MG/ML IJ SOLN: 0.0000 mg | Freq: Two times a day (BID) | INTRAMUSCULAR | Status: DC

## 2016-02-10 MED ORDER — LORAZEPAM 2 MG/ML IJ SOLN
INTRAMUSCULAR | Status: AC
  Administered 2016-02-10: 2 mg via INTRAVENOUS
  Filled 2016-02-10: qty 1

## 2016-02-10 NOTE — Progress Notes (Signed)
S/w dr dew. Pt agitated, removing icu monitors and trying to leave. Family at bedside attempting to calm pt down with no resolve. Vss, pt denies pain, sitter also at bedside. Orders placed by dr dew (see MAR for details). rn will continue to monitor.

## 2016-02-10 NOTE — Progress Notes (Signed)
[facility] Progess Note    ASSESSMENT/PLAN    75 yo male admitted to ICU for acute post op resp failure from shock s/p AAA rupture and endovascular repair  PULMONARY -Patient was initially Intubated postprocedure, extubated yesterday, doing well. -Current vital signs stable, patient's oxygen saturation is 98% on room air.  CARDIOVASCULAR -Status post hemorrhagic shock, now off of pressors.  NEUROLOGIC - Patient confused, likely some degree of delirium. -Continue reorientation, sitter, medications as needed.  RENAL Renal function improving.  GASTROINTESTINAL Advance diet per surgery.   HEMATOLOGIC Follow CBC  Patient's condition appears to have improved significant only, critical care service will sign off for now. Please call if there are any further issues or concerns.  ---------------------------------------   ----------------------------------------   Name: Larry Buckley MRN: [id number] DOB: 10-13-1941    ADMISSION DATE:  02/08/2016     SUBJECTIVE:  Patient is awake, alert but disoriented.  Review of Systems:  Constitutional: Feels well. Cardiovascular: No chest pain.  Pulmonary: Denies dyspnea.   The remainder of systems were reviewed and were found to be negative other than what is documented in the HPI.    VITAL SIGNS: Temp:  [98.7 F (37.1 C)-99.9 F (37.7 C)] 98.7 F (37.1 C) (04/27 0700) Pulse Rate:  [88-144] 144 (04/27 0600) Resp:  [17-25] 23 (04/27 0700) BP: (105-162)/(53-118) 139/88 mmHg (04/27 0700) SpO2:  [88 %-100 %] 98 % (04/27 0600) FiO2 (%):  [28 %] 28 % (04/26 1804) HEMODYNAMICS:   VENTILATOR SETTINGS: Vent Mode:  [-]  FiO2 (%):  [28 %] 28 % INTAKE / OUTPUT:  Intake/Output Summary (Last 24 hours) at 02/10/16 0849 Last data filed at 02/10/16 0745  Gross per 24 hour  Intake    600 ml  Output   1710 ml  Net  -1110 ml    PHYSICAL EXAMINATION: Physical Examination:   VS: BP 139/88 mmHg  Pulse  144  Temp(Src) 98.7 F (37.1 C) (Oral)  Resp 23  Ht  (1.88 m)  Wt 202 lb 2.6 oz (91.7 kg)  BMI 25.94 kg/m2  SpO2 98%  General Appearance: No distress  Neuro:without focal findings, mental status: Confused HEENT: PERRLA, EOM intact. Pulmonary: normal breath sounds   CardiovascularNormal S1,S2.  No m/r/g.   Abdomen: Benign, Soft, non-tender. Renal:  No costovertebral tenderness  GU:  Not performed at this time. Endocrine: No evident thyromegaly. Skin:   warm, no rashes, no ecchymosis  Extremities: normal, no cyanosis, clubbing.   LABS:   LABORATORY PANEL:   CBC  Recent Labs Lab 02/10/16 0526  WBC 9.4  HGB 10.3*  HCT 30.4*  PLT 131*    Chemistries   Recent Labs Lab 02/08/16 1604  02/10/16 0526  NA 137  < > 142  K 3.1*  < > 4.8  CL 100*  < > 110  CO2 24  < > 24  GLUCOSE 216*  < > 118*  BUN 10  < > 19  CREATININE 1.08  < > 1.12  CALCIUM 9.1  < > 8.0*  AST 18  --   --   ALT 11*  --   --   ALKPHOS 105  --   --   BILITOT 1.0  --   --   < > = values in this interval not displayed.  No results for input(s): GLUCAP in the last 168 hours.  Recent Labs Lab 02/09/16 0058 02/09/16 0820  PHART 7.37 7.34*  PCO2ART 34 36  PO2ART 157* 109*  Recent Labs Lab 02/08/16 1604  AST 18  ALT 11*  ALKPHOS 105  BILITOT 1.0  ALBUMIN 3.5    Cardiac Enzymes  Recent Labs Lab 02/08/16 1604  TROPONINI <0.03    RADIOLOGY:  Dg Lumbar Spine 2-3 Views  02/08/2016  CLINICAL DATA:  Low back pain for 1 week EXAM: LUMBAR SPINE - 3 VIEW COMPARISON:  None. FINDINGS: Five lumbar type vertebral bodies are well visualized. Vertebral body height is well maintained. Osteophytic changes are noted throughout the lumbar spine. No anterolisthesis is seen. Aortic calcifications are noted. There calcifications significantly displaced to the left of the midline raising suspicion for abdominal aortic aneurysm. Correlation with the physical exam is recommended. CT would be helpful  for further evaluation. IMPRESSION: Calcifications arising from the aorta which appears significantly displaced from the midline. This may be related to a tortuous aorta although the possibility of an aortic aneurysm could not be totally excluded. CT would be helpful for further evaluation. Electronically Signed   By: Alcide Clever M.D.   On: 02/08/2016 17:37   Dg Chest Port 1 View  02/09/2016  CLINICAL DATA:  Post intubation. EXAM: PORTABLE CHEST 1 VIEW COMPARISON:  Earlier this day at 1810 hour FINDINGS: Endotracheal tube 4.6 cm from the carina. Low lung volumes persist. Increasing bibasilar atelectasis. Cardiomediastinal contours are unchanged. No pneumothorax or large pleural effusion. IMPRESSION: 1. Endotracheal tube 4.6 cm from the carina. 2. Increasing bibasilar atelectasis. Electronically Signed   By: Rubye Oaks M.D.   On: 02/09/2016 00:09   Dg Chest Portable 1 View  02/08/2016  CLINICAL DATA:  Increasing pain. EXAM: PORTABLE CHEST 1 VIEW COMPARISON:  None. FINDINGS: Normal heart size. Decreased lung volumes. No pleural effusion or edema. No airspace consolidation. IMPRESSION: 1. Low lung volumes. Electronically Signed   By: Signa Kell M.D.   On: 02/08/2016 18:29   Ct Cta Abd/pel W/cm &/or W/o Cm  02/08/2016  CLINICAL DATA:  Increasing back pain EXAM: CTA ABDOMEN AND PELVIS wITHOUT AND WITH CONTRAST TECHNIQUE: Multidetector CT imaging of the abdomen and pelvis was performed using the standard protocol during bolus administration of intravenous contrast. Multiplanar reconstructed images and MIPs were obtained and reviewed to evaluate the vascular anatomy. CONTRAST:  100 mL Isovue 370. COMPARISON:  Plain film from earlier in the same day. FINDINGS: The lung bases demonstrate some minimal dependent atelectatic changes. No sizable effusion is seen. There is evidence of a large abdominal aortic aneurysm which measures at least 8.9 by 8.8 cm in greatest AP and transverse dimensions respectively.  There is considerable mural thrombus identified within the aneurysm however at its most superior aspect just below the renal arteries there is evidence of active extravasation posteriorly. On the delayed images increasing passage of contrast material posteriorly is noted. Considerable changes are noted surrounding the aorta consistent with recent extravasation. These appear more subacute in nature given the acute hemorrhage seen on the current exam. The infrarenal neck a short measuring approximately 11 mm. Heavy calcification is noted in the infrarenal aorta as well. 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 Considerable collateralization from the superior mesenteric artery to the celiac axis branches is noted. The inferior mesenteric artery is patent arising from the anterior aspect of the aortic aneurysm. There is aneurysmal dilatation of the right common iliac artery to 3.1 cm. The remainder the iliac vessels although somewhat calcified are otherwise within normal limits. The liver, spleen, adrenal glands and pancreas are within normal limits.  The gallbladder is well distended. The kidneys demonstrate a normal enhancement pattern without obstructive changes. As previously described, there is considerable fluid attenuation within the retroperitoneal structures consistent with prior extravasation from the aneurysm. No free pelvic fluid is seen. The bladder is well distended. The appendix is within normal limits. Scattered diverticular change is noted without diverticulitis. Bony structures are within normal limits for the patient's given age. Review of the MIP images confirms the above findings. IMPRESSION: Large abdominal aortic aneurysm with evidence of rupture and active extravasation during the exam. There also changes of prior extravasation in the retroperitoneum. The infrarenal neck a short at 11 mm. Right common iliac artery aneurysm. Other chronic changes as  described Critical Value/emergent results were called by telephone by Dr. Richarda OverlieAdam Henn at the time of interpretation on 02/08/2016 at 6:20 pm to Dr. Toney RakesERYKA GAYLE , who verbally acknowledged these results. Electronically Signed   By: Alcide CleverMark  Lukens M.D.   On: 02/08/2016 18:38       --Wells Guileseep Carizma Dunsworth, MD.  ICU Pager: [phone] [facility] Office Number: [phone]  Santiago Gladavid Kasa, M.D.  Stephanie AcreVishal Mungal, M.D.  Billy Fischeravid Simonds, M.D  02/10/2016

## 2016-02-10 NOTE — Care Management CHF Note (Addendum)
Patient presents with lower back pain and found to have ruptured AAA. Surgical intervention.  Patient is confused and known etoh use.  Will be placed on CIWA.  Discussed transfer out of icu.  Will be deferred to vascular due to concerns over impending etoh withdrawal sx. Spoke with patient's wife.  Patient has drank every day for the past 23 years- the whole time they have been married. [facility] but has not been seen for the last several years "since the old man retired."  Patient has not taken any of his medications- ie blood pressure meds for several years.   Mrs Anselm Lisnoch has [facility] for Feb 29 2016.  Prior to this episode of illness, patient independent in all his adls.  At present, anticipate patient to discharge home.  He may benefit from some etoh resources.   Patient at present is requiring a sitter for safety due to his agitation.  Wife says "he got so mean this morning- I just came home."  Started on clear liquids today

## 2016-02-10 NOTE — Progress Notes (Addendum)
Chaplain rounded the unit and provided a compassionate presence and support to the family while the patient appeared to be sleeping.  Chaplain Leighana Neyman [phone] 

## 2016-02-10 NOTE — Progress Notes (Signed)
[facility]  Daily Progress Note   Subjective  - 2 Days Post-Op  Agitated today.  Better after ativan.  Drinks heavily at home per son. Renal function normal now.  Otherwise doing ok  Objective Filed Vitals:   02/10/16 0730 02/10/16 0800 02/10/16 0900 02/10/16 1000  BP: 139/88 107/90 126/70 149/71  Pulse:  103 102 99  Temp: 98.7 F (37.1 C)     TempSrc: Oral     Resp:  18 22 20   Height:      Weight:      SpO2: 95% 93% 95% 94%    Intake/Output Summary (Last 24 hours) at 02/10/16 1315 Last data filed at 02/10/16 0900  Gross per 24 hour  Intake    825 ml  Output   1310 ml  Net   -485 ml    PULM  CTAB CV  RRR VASC  Palpable pedal pulses present  Laboratory CBC    Component Value Date/Time   WBC 9.4 02/10/2016 0526   HGB 10.3* 02/10/2016 0526   HCT 30.4* 02/10/2016 0526   PLT 131* 02/10/2016 0526    BMET    Component Value Date/Time   NA 142 02/10/2016 0526   K 4.8 02/10/2016 0526   CL 110 02/10/2016 0526   CO2 24 02/10/2016 0526   GLUCOSE 118* 02/10/2016 0526   BUN 19 02/10/2016 0526   CREATININE 1.12 02/10/2016 0526   CALCIUM 8.0* 02/10/2016 0526   GFRNONAA >60 02/10/2016 0526   GFRAA >60 02/10/2016 0526    Assessment/Planning: POD #2 s/p ruptured AAA repair.   Doing well from a surgical standpoint, but now worried about ETOH withdrawal.  Ativan seems to have helped agitation.  If he is able to take PO and his ileus is not severe, this will be his last hold up on going home  Renal function has normalized.  Start diuresis today  Hgb stable    DEW,JASON  02/10/2016, 1:15 PM

## 2016-02-10 NOTE — Progress Notes (Signed)
[employer] - [place] at [facility]   PATIENT NAME: Larry Buckley    MR#:  [id number]  DATE OF BIRTH:  1941/09/01  SUBJECTIVE:   Patient is status post abdominal aortic aneurysm repair postop day #1. Bit confused and delirious this morning. Patient's brother was at bedside.  REVIEW OF SYSTEMS:    Review of Systems  Unable to perform ROS: mental acuity    Nutrition: Clear liquid Tolerating Diet: Yes Tolerating PT: Await evaluation   DRUG ALLERGIES:   Allergies  Allergen Reactions  . Lotensin [Benazepril Hcl] Swelling    VITALS:  Blood pressure 135/80, pulse 103, temperature 98.7 F (37.1 C), temperature source Oral, resp. rate 19, height 6\' 2"  (1.88 m), weight 91.7 kg (202 lb 2.6 oz), SpO2 93 %.  PHYSICAL EXAMINATION:   Physical Exam  GENERAL:  75 y.o.-year-old patient sitting up in chair confused/delirious EYES: Pupils equal, round, reactive to light and accommodation. No scleral icterus. Extraocular muscles intact.  HEENT: Head atraumatic, normocephalic. Oropharynx and nasopharynx clear.  NECK:  Supple, no jugular venous distention. No thyroid enlargement, no tenderness.  LUNGS: Normal breath sounds bilaterally, no wheezing, rales, rhonchi. No use of accessory muscles of respiration.  CARDIOVASCULAR: S1, S2 normal. No murmurs, rubs, or gallops.  ABDOMEN: Soft, nontender, distended. hypoActive bowel sounds. No organomegaly or mass.  EXTREMITIES: No cyanosis, clubbing or edema b/l.    NEUROLOGIC: Cranial nerves II through XII are intact. No focal Motor or sensory deficits b/l.   PSYCHIATRIC: The patient is alert and oriented x 1.  SKIN: No obvious rash, lesion, or ulcer.    LABORATORY PANEL:   CBC  Recent Labs Lab 02/10/16 0526  WBC 9.4  HGB 10.3*  HCT 30.4*  PLT 131*   ------------------------------------------------------------------------------------------------------------------  Chemistries   Recent Labs Lab 02/08/16 1604   02/10/16 0526  NA 137  < > 142  K 3.1*  < > 4.8  CL 100*  < > 110  CO2 24  < > 24  GLUCOSE 216*  < > 118*  BUN 10  < > 19  CREATININE 1.08  < > 1.12  CALCIUM 9.1  < > 8.0*  AST 18  --   --   ALT 11*  --   --   ALKPHOS 105  --   --   BILITOT 1.0  --   --   < > = values in this interval not displayed. ------------------------------------------------------------------------------------------------------------------  Cardiac Enzymes  Recent Labs Lab 02/08/16 1604  TROPONINI <0.03   ------------------------------------------------------------------------------------------------------------------  RADIOLOGY:  Dg Lumbar Spine 2-3 Views  02/08/2016  CLINICAL DATA:  Low back pain for 1 week EXAM: LUMBAR SPINE - 3 VIEW COMPARISON:  None. FINDINGS: Five lumbar type vertebral bodies are well visualized. Vertebral body height is well maintained. Osteophytic changes are noted throughout the lumbar spine. No anterolisthesis is seen. Aortic calcifications are noted. There calcifications significantly displaced to the left of the midline raising suspicion for abdominal aortic aneurysm. Correlation with the physical exam is recommended. CT would be helpful for further evaluation. IMPRESSION: Calcifications arising from the aorta which appears significantly displaced from the midline. This may be related to a tortuous aorta although the possibility of an aortic aneurysm could not be totally excluded. CT would be helpful for further evaluation. Electronically Signed   By: Alcide CleverMark  Lukens M.D.   On: 02/08/2016 17:37   Dg Chest Port 1 View  02/09/2016  CLINICAL DATA:  Post intubation. EXAM: PORTABLE CHEST 1 VIEW COMPARISON:  Earlier  this day at 1810 hour FINDINGS: Endotracheal tube 4.6 cm from the carina. Low lung volumes persist. Increasing bibasilar atelectasis. Cardiomediastinal contours are unchanged. No pneumothorax or large pleural effusion. IMPRESSION: 1. Endotracheal tube 4.6 cm from the carina. 2.  Increasing bibasilar atelectasis. Electronically Signed   By: Rubye Oaks M.D.   On: 02/09/2016 00:09   Dg Chest Portable 1 View  02/08/2016  CLINICAL DATA:  Increasing pain. EXAM: PORTABLE CHEST 1 VIEW COMPARISON:  None. FINDINGS: Normal heart size. Decreased lung volumes. No pleural effusion or edema. No airspace consolidation. IMPRESSION: 1. Low lung volumes. Electronically Signed   By: Signa Kell M.D.   On: 02/08/2016 18:29   Ct Cta Abd/pel W/cm &/or W/o Cm  02/08/2016  CLINICAL DATA:  Increasing back pain EXAM: CTA ABDOMEN AND PELVIS wITHOUT AND WITH CONTRAST TECHNIQUE: Multidetector CT imaging of the abdomen and pelvis was performed using the standard protocol during bolus administration of intravenous contrast. Multiplanar reconstructed images and MIPs were obtained and reviewed to evaluate the vascular anatomy. CONTRAST:  100 mL Isovue 370. COMPARISON:  Plain film from earlier in the same day. FINDINGS: The lung bases demonstrate some minimal dependent atelectatic changes. No sizable effusion is seen. There is evidence of a large abdominal aortic aneurysm which measures at least 8.9 by 8.8 cm in greatest AP and transverse dimensions respectively. There is considerable mural thrombus identified within the aneurysm however at its most superior aspect just below the renal arteries there is evidence of active extravasation posteriorly. On the delayed images increasing passage of contrast material posteriorly is noted. Considerable changes are noted surrounding the aorta consistent with recent extravasation. These appear more subacute in nature given the acute hemorrhage seen on the current exam. The infrarenal neck a short measuring approximately 11 mm. Heavy calcification is noted in the infrarenal aorta as well. 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 Considerable collateralization from the superior mesenteric artery to the celiac axis branches is  noted. The inferior mesenteric artery is patent arising from the anterior aspect of the aortic aneurysm. There is aneurysmal dilatation of the right common iliac artery to 3.1 cm. The remainder the iliac vessels although somewhat calcified are otherwise within normal limits. The liver, spleen, adrenal glands and pancreas are within normal limits. The gallbladder is well distended. The kidneys demonstrate a normal enhancement pattern without obstructive changes. As previously described, there is considerable fluid attenuation within the retroperitoneal structures consistent with prior extravasation from the aneurysm. No free pelvic fluid is seen. The bladder is well distended. The appendix is within normal limits. Scattered diverticular change is noted without diverticulitis. Bony structures are within normal limits for the patient's given age. Review of the MIP images confirms the above findings. IMPRESSION: Large abdominal aortic aneurysm with evidence of rupture and active extravasation during the exam. There also changes of prior extravasation in the retroperitoneum. The infrarenal neck a short at 11 mm. Right common iliac artery aneurysm. Other chronic changes as described Critical Value/emergent results were called by telephone by Dr. Richarda Overlie at the time of interpretation on 02/08/2016 at 6:20 pm to Dr. Toney Rakes , who verbally acknowledged these results. Electronically Signed   By: Alcide Clever M.D.   On: 02/08/2016 18:38     ASSESSMENT AND PLAN:   75 year old male with past medical history of alcohol abuse, attention, hyperlipidemia, history of gout who presented to the hospital due to a abdominal aortic aneurysm rupture with bleeding and  is status post repair postop day #1.  1. Aortic aneurysm rupture status post repair postop day #1-hemoglobin stable. -Hemodynamically stable. Continue further care as per vascular surgery.  2. Altered mental status/delirium-patient has a history of alcohol  abuse to for this is suspected to be alcohol withdrawal. -Given some Ativan with minimal improvement. I will place the patient on a Precedex drip. -Follow mental status  3. Acute kidney injury-secondary to ATN/volume loss. -Improved with IV fluids and will monitor.  4. Hyperkalemia-not improved and resolved.  5. Ileus-postoperative. Currently asymptomatic with no nausea and vomiting. -Clear liquid diet for now. Follow clinically.  6. HTN - bp stable.  Will monitor.  - cont. PRN metoprolol, labetalol for now.  All the records are reviewed and case discussed with Care Management/Social Workerr. Management plans discussed with the patient, family and they are in agreement.  CODE STATUS: Full  DVT Prophylaxis: Teds and SCDs  TOTAL TIME TAKING CARE OF THIS PATIENT: 30 minutes.   POSSIBLE D/C unclear, DEPENDING ON CLINICAL CONDITION.   Houston Siren M.D on 02/10/2016 at 2:26 PM  Between 7am to 6pm - Pager - [phone]  After 6pm go to [email or web address] - password [email or web address] [facility]  [facility]  Office  [phone]  CC: Primary care physician; No primary care provider on file.

## 2016-02-10 NOTE — Progress Notes (Signed)
Pt is restless trying to get out of bed pulling on sheet.able to communicate effectively.pt hitting and kicking legs. Pulled all monitor leads and iv lines. VSS. Not in any distress.RN and shift co-ordinator discussed reason for rest.placed on 1:1 obs. Will continue to observe closely.

## 2016-02-11 LAB — CBC
HEMATOCRIT: 26.2 % — AB (ref 40.0–52.0)
Hemoglobin: 8.8 g/dL — ABNORMAL LOW (ref 13.0–18.0)
MCH: 30.8 pg (ref 26.0–34.0)
MCHC: 33.7 g/dL (ref 32.0–36.0)
MCV: 91.5 fL (ref 80.0–100.0)
PLATELETS: 159 10*3/uL (ref 150–440)
RBC: 2.86 MIL/uL — AB (ref 4.40–5.90)
RDW: 17.4 % — ABNORMAL HIGH (ref 11.5–14.5)
WBC: 11 10*3/uL — ABNORMAL HIGH (ref 3.8–10.6)

## 2016-02-11 LAB — PREPARE PLATELET PHERESIS
UNIT DIVISION: 0
UNIT DIVISION: 0

## 2016-02-11 LAB — BASIC METABOLIC PANEL
Anion gap: 8 (ref 5–15)
BUN: 21 mg/dL — AB (ref 6–20)
CO2: 23 mmol/L (ref 22–32)
Calcium: 8.3 mg/dL — ABNORMAL LOW (ref 8.9–10.3)
Chloride: 108 mmol/L (ref 101–111)
Creatinine, Ser: 0.87 mg/dL (ref 0.61–1.24)
GFR calc Af Amer: >60 mL/min (ref >60)
GLUCOSE: 105 mg/dL — AB (ref 65–99)
POTASSIUM: 4.2 mmol/L (ref 3.5–5.1)
Sodium: 139 mmol/L (ref 135–145)

## 2016-02-11 MED ORDER — ADULT MULTIVITAMIN W/MINERALS CH: 1.0000 | ORAL_TABLET | Freq: Every day | ORAL | Status: DC

## 2016-02-11 MED ORDER — HALOPERIDOL LACTATE 5 MG/ML IJ SOLN: 2.0000 mg | INTRAMUSCULAR | Status: DC | PRN

## 2016-02-11 MED ORDER — OXYCODONE-ACETAMINOPHEN 5-325 MG PO TABS: 1.0000 | ORAL_TABLET | ORAL | Status: DC | PRN

## 2016-02-11 MED ORDER — ASPIRIN EC 81 MG PO TBEC: 81.0000 mg | DELAYED_RELEASE_TABLET | Freq: Every day | ORAL | Status: DC

## 2016-02-11 MED ORDER — HALOPERIDOL LACTATE 5 MG/ML IJ SOLN
2.0000 mg | Freq: Four times a day (QID) | INTRAMUSCULAR | Status: DC | PRN
  Administered 2016-02-12 – 2016-02-17 (×5): 5 mg via INTRAVENOUS
  Filled 2016-02-11 (×6): qty 1

## 2016-02-11 MED ORDER — DOCUSATE SODIUM 100 MG PO CAPS: 100.0000 mg | ORAL_CAPSULE | Freq: Every day | ORAL | Status: DC

## 2016-02-11 MED ORDER — HALOPERIDOL LACTATE 5 MG/ML IJ SOLN: 1.0000 mg | INTRAMUSCULAR | Status: DC | PRN

## 2016-02-11 MED ORDER — IPRATROPIUM-ALBUTEROL 0.5-2.5 (3) MG/3ML IN SOLN
3.0000 mL | Freq: Four times a day (QID) | RESPIRATORY_TRACT | Status: DC | PRN
  Administered 2016-02-12: 3 mL via RESPIRATORY_TRACT
  Filled 2016-02-11: qty 3

## 2016-02-11 MED ORDER — ASPIRIN EC 81 MG PO TBEC
81.0000 mg | DELAYED_RELEASE_TABLET | Freq: Every day | ORAL | Status: DC
  Administered 2016-02-11 – 2016-02-17 (×7): 81 mg via ORAL
  Filled 2016-02-11 (×8): qty 1

## 2016-02-11 NOTE — Progress Notes (Signed)
Pt has not voided since foley pulled at 0930. rn has encouraged water and urinal with no success. Pt bladder scanned, in bladder. Per protocol, volume less then 450. rn will continue to monitor.

## 2016-02-11 NOTE — Progress Notes (Signed)
[employer] - [place] at [facility]   PATIENT NAME: Larry Buckley    MR#:  [id number]  DATE OF BIRTH:  1941/08/25  SUBJECTIVE:   Patient is status post abdominal aortic aneurysm repair postop day #2. Very lethargi/Encephalopathic this morning as pt. Got increasing amounts of ATivan overnight for CIWA protocol.    REVIEW OF SYSTEMS:    Review of Systems  Unable to perform ROS: mental acuity    Nutrition: Clear liquid Tolerating Diet: very little.  Tolerating PT: Await evaluation   DRUG ALLERGIES:   Allergies  Allergen Reactions  . Lotensin [Benazepril Hcl] Swelling    VITALS:  Blood pressure 169/72, pulse 103, temperature 97.7 F (36.5 C), temperature source Oral, resp. rate 27, height  (1.88 m), weight 91.7 kg (202 lb 2.6 oz), SpO2 99 %.  PHYSICAL EXAMINATION:   Physical Exam  GENERAL:  75 y.o.-year-old patient sitting up in chair lethargic/encephalopathic EYES: Pupils equal, round, reactive to light and accommodation. No scleral icterus. Extraocular muscles intact.  HEENT: Head atraumatic, normocephalic. Oropharynx and nasopharynx clear.  NECK:  Supple, no jugular venous distention. No thyroid enlargement, no tenderness.  LUNGS: Normal breath sounds bilaterally, no wheezing, rales, rhonchi. No use of accessory muscles of respiration.  CARDIOVASCULAR: S1, S2 normal. No murmurs, rubs, or gallops.  ABDOMEN: Soft, nontender, distended. hypoActive bowel sounds. No organomegaly or mass.  EXTREMITIES: No cyanosis, clubbing or edema b/l.    NEUROLOGIC: Cranial nerves II through XII are intact. No focal Motor or sensory deficits b/l. Globally weak.    PSYCHIATRIC: The patient is alert and oriented x 1.  SKIN: No obvious rash, lesion, or ulcer.    LABORATORY PANEL:   CBC  Recent Labs Lab 02/11/16 0439  WBC 11.0*  HGB 8.8*  HCT 26.2*  PLT 159    ------------------------------------------------------------------------------------------------------------------  Chemistries   Recent Labs Lab 02/08/16 1604  02/11/16 0439  NA 137  < > 139  K 3.1*  < > 4.2  CL 100*  < > 108  CO2 24  < > 23  GLUCOSE 216*  < > 105*  BUN 10  < > 21*  CREATININE 1.08  < > 0.87  CALCIUM 9.1  < > 8.3*  AST 18  --   --   ALT 11*  --   --   ALKPHOS 105  --   --   BILITOT 1.0  --   --   < > = values in this interval not displayed. ------------------------------------------------------------------------------------------------------------------  Cardiac Enzymes  Recent Labs Lab 02/08/16 1604  TROPONINI <0.03   ------------------------------------------------------------------------------------------------------------------  RADIOLOGY:  No results found.   ASSESSMENT AND PLAN:   75 year old male with past medical history of alcohol abuse, attention, hyperlipidemia, history of gout who presented to the hospital due to a abdominal aortic aneurysm rupture with bleeding and is status post repair postop day #1.  1. Aortic aneurysm rupture status post repair postop day #2 -hemoglobin stable. -Hemodynamically stable. Continue further care as per vascular surgery.  2. Altered mental status/delirium-patient has a history of alcohol abuse/ETOH withdrawal.  - got increasing amounts of Ativan overnight and therefore very lethargic today.  - will hold Ativan and if needed would consider starting on Precedex gtt. Discussed w/ nursing staff.  -Follow mental status  3. Acute kidney injury-secondary to ATN/volume loss. -Improved with IV fluids and will monitor.  4. Hyperkalemia-not improved and resolved.  5. Ileus-postoperative. Currently asymptomatic with no nausea and vomiting. -Clear liquid diet for now. Follow clinically.  6. HTN - bp stable.  Will monitor.  - cont. PRN metoprolol, labetalol for now.  All the records are reviewed and case  discussed with Care Management/Social Workerr. Management plans discussed with the patient, family and they are in agreement.  CODE STATUS: Full  DVT Prophylaxis: Teds and SCDs  TOTAL TIME TAKING CARE OF THIS PATIENT: 35 minutes.   POSSIBLE D/C unclear, DEPENDING ON CLINICAL CONDITION.   [place] SirenSAINANI,Clelia Trabucco J M.D on 02/11/2016 at 3:24 PM  Between 7am to 6pm - Pager - [phone]  After 6pm go to [email or web address] - password [email or web address]  [facility]  Office  [phone]  CC: Primary care physician; No primary care provider on file.

## 2016-02-11 NOTE — Progress Notes (Signed)
LCSW met with patient. He was oriented to himself but not aware of his surroundings. LCSW will attempt to reach either son or wife to assist with Alcohol resources. Patient stated he "dont need any help with drinking issues". [employer] LCSW  [phone]

## 2016-02-11 NOTE — Care Management (Signed)
CIWA score as high as 14 today.  Obtained order for physical therapy eval.  Reported to CM that patient is having difficulty sitting up on the side of bed without assistance.

## 2016-02-11 NOTE — Progress Notes (Signed)
Pt became very aggressive and agitated at 2200.  Pt pulling at lines, attempting to leave room, and swinging at staff.  Pts wife and son called, informed of pt behavior.  MD called and one time order for ativan given.   Pt spoke with son and PRN given. Pt ultimately calmed.  Sitter at bedside, VSS, will continue to monitor.

## 2016-02-11 NOTE — Progress Notes (Signed)
Called MD to inquire about giving pt breathing treatment since he was sedated, he was unable to cough up the phlegm on his own and was wheezing. Told MD pt had to be put on 2L O2.

## 2016-02-11 NOTE — Progress Notes (Signed)
LCSW called son collected data to complete assessment. Patient will return home with his wife. Son requested he be called when his Dad is to be discharged. [phone].  Delta Air LinesClaudine Lewis Keats LCSW [phone]

## 2016-02-11 NOTE — Progress Notes (Signed)
Pt slept the majority of the day. Sitter at bedside. He did awaken for brief periods and at times was agitated and combative. Pt continues to display confusion and disorientation. No Ativan was given to pt during day shift. Encouraged fluids and urinal throughout the day. Pt refused to eat. Pt vss, denies pain. RN will continue to monitor.

## 2016-02-11 NOTE — Evaluation (Signed)
Physical Therapy Evaluation Patient Details Name: Larry Buckley MRN: [id number] DOB: Oct 21, 1940 Today's Date: 02/11/2016   History of Present Illness  Pt is a 75 y.o. M admitted to hospital for low back pain and nausea. Pt diagnosed with ruptured abdominal aorta aneurysm. Pt received repair surgery on 4/25. Pt intubatd at time of surgery and extubated on 4/26. Pt has hx of HTN, hyperlipidemia, gout and alcoholism.   Clinical Impression  Pt is a 75 y.o. M diagnosed with ruptured abdominal aortic aneurysm and received surgery to repair aneurysm on 4/25. Prior to admission, pt lived at home with wife. Pt very active and did not use AD for ambulation. Pt very lethargic during treatment d/t medication, per RN. Pts eyes open at start of treatment, however closed several times. Pts son present during evaluation. Pt demonstrated poor B UE and L LE strength, and good R LE strength. Unable to confirm if weakness d/t decreased strength or lethargic state. Pt required 2+ assist to sit at edge of chair. Pt unable to maintain sitting balance at edge of chair without support from therapist. Pt demonstrates deficits in strength, balance, and mobility. Pt would benefit from further skilled PT to address deficits; recommend pt sent to SNF after discharge from acute hospitalization. Will continue to further assess pt.    Follow Up Recommendations SNF    Equipment Recommendations       Recommendations for Other Services       Precautions / Restrictions Precautions Precautions: Fall Restrictions Weight Bearing Restrictions: No      Mobility  Bed Mobility               General bed mobility comments: Pt sitting in chair at start of evaluation.   Transfers                 General transfer comment: Unable to assess transfer d/t pt being lethargic and unable to follow commands well.  Ambulation/Gait                Stairs            Wheelchair Mobility    Modified Rankin  (Stroke Patients Only)       Balance Overall balance assessment: Needs assistance Sitting-balance support: Bilateral upper extremity supported;Feet supported Sitting balance-Leahy Scale: Poor Sitting balance - Comments: Pt unable to maintain sitting balance at the edge of chair. Pt instructed to sit on edge of chair, 2+ assistance provided. PT able to help pt remain in upright posture, however pt unable to maintain w/o assist.  Postural control: Posterior lean     Standing balance comment: Unable to assess pts standing balance d/t pts safety.                              Pertinent Vitals/Pain Pain Assessment: Faces Faces Pain Scale: Hurts little more    Home Living Family/patient expects to be discharged to:: Private residence Living Arrangements: Spouse/significant other Available Help at Discharge: Family             Additional Comments: Pt very lethargic, unable to get full hx on home setup.    Prior Function Level of Independence: Independent         Comments: Per son, pt performed all ADLs and was very active. Pt did not use any AD.     Hand Dominance        Extremity/Trunk Assessment   Upper Extremity Assessment: RUE  deficits/detail;LUE deficits/detail RUE Deficits / Details: R UE grossly 4-/5 strength, weak grip strength, unable to flex shoulder when asked.      LUE Deficits / Details: L UE grossly 4-/5 strength, weak grip strength, unable to flex shoulder when asked.   Lower Extremity Assessment: RLE deficits/detail;LLE deficits/detail RLE Deficits / Details: R LE grossly 4/5 strength LLE Deficits / Details: L LE grossly 3/5 strength, unsure if due to weakness or pt unable to follow commands w/LLE     Communication   Communication: No difficulties  Cognition Arousal/Alertness: Suspect due to medications Behavior During Therapy:  (lethargic) Overall Cognitive Status: Difficult to assess                      General Comments       Exercises        Assessment/Plan    PT Assessment Patient needs continued PT services  PT Diagnosis Difficulty walking;Abnormality of gait;Generalized weakness   PT Problem List Decreased strength;Decreased balance;Decreased mobility;Decreased activity tolerance  PT Treatment Interventions DME instruction;Gait training;Stair training;Therapeutic activities;Therapeutic exercise;Balance training   PT Goals (Current goals can be found in the Care Plan section) Acute Rehab PT Goals Patient Stated Goal: to return to PLOF PT Goal Formulation: Patient unable to participate in goal setting Time For Goal Achievement: 02/25/16 Potential to Achieve Goals: Fair    Frequency Min 2X/week   Barriers to discharge Inaccessible home environment;Decreased caregiver support      Co-evaluation               End of Session   Activity Tolerance: Patient limited by lethargy Patient left: in chair;with call bell/phone within reach;with nursing/sitter in room;with family/visitor present           Time: [id number] PT Time Calculation (min) (ACUTE ONLY): 11 min   Charges:         PT G Codes:        Dorita FrayMartha Joni Norrod 02/11/2016, 4:10 PM M. Hettie Holsteinlaire Nanie Dunkleberger, SPT

## 2016-02-11 NOTE — Progress Notes (Signed)
Pt combative, trying to hit staff, trying to get out of bed and pulling at lines. Charge nurse from night and day in room and because MD did not want pt on Ativan and said may restart Precedex, Precedx restarted. Day and night charge nurse agreed to restarted gtt at 1.2 mcg because pt very combative and agitated. 1:1 sitter at bedside. Several attempts to start second iv line unsuccessfully.

## 2016-02-11 NOTE — Clinical Social Work Note (Signed)
Clinical Social Work Assessment  Patient Details  Name: Larry Buckley MRN: [id number] Date of Birth: 1940/10/26  Date of referral:  02/11/16               Reason for consult:  Substance Use/ETOH Abuse                Permission sought to share information with:  Family Supports Permission granted to share information::  Yes, Verbal Permission Granted  Name::     Wife Toris Laverdiere [phone] Son Saralyn Pilar [phone]  Agency::  na  Relationship::  yes  Contact Information:  yes  Housing/Transportation Living arrangements for the past 2 months:  Single Family Home Source of Information:  Adult Children, Spouse Patient Interpreter Needed:  None Criminal Activity/Legal Involvement Pertinent to Current Situation/Hospitalization:  No - Comment as needed Significant Relationships:  Adult Children, Spouse Lives with:  Spouse Do you feel safe going back to the place where you live?  Yes Need for family participation in patient care:  Yes (Comment)  Care giving concerns:  Son reports his day used to be very oriented to self,place and situations. He reports his Dad drinks every day 12+ day and loves being out in the yard.   Social Worker assessment / plan:  LCSW met with patient who was unable to communicate well. He was orient to self not to situation or time. His eyes coninued to close when I was asking him simple questions. Called son. Informed his Dad is a retired [place] enjoys to spend his time in his [place] doing yard work. He reports his Dad will drink up to 12 or more beers in the course of the day every day. When asked if patient needed help he stated no. The family's plan is for patient to return home with his wife. Patient gave verbal consent to speak to his son/wife.  Patient is insured by [employer]. Son reports patient is mobile and very independent with all his ADL and has no mbility issues.  Employment status:  Retired (Used to be a Information systems manager) Insurance information:   Astronomer care/Medicare PT Recommendations:  Not assessed at this time Information / Referral to community resources:  Outpatient Substance Abuse Treatment Options  Patient/Family's Response to care: LCSW reviewed and left several out patient resources for patient and family to review. Family agrees pt lacks insight to drinking issues.  Patient/Family's Understanding of and Emotional Response to Diagnosis, Current Treatment, and Prognosis:  They have a good understanding of Dads aneurysm but are not clear why he is so disoriented to his surroundings.   Emotional Assessment Appearance:  Appears stated age Attitude/Demeanor/Rapport:  Guarded, Combative Affect (typically observed):  Restless, Unable to Assess Orientation:  Oriented to Self Alcohol / Substance use:  Alcohol Use (12 beers per day or more every day) Psych involvement (Current and /or in the community):  No (Comment)  Discharge Needs  Concerns to be addressed:  Denies Needs/Concerns at this time, Substance Abuse Concerns Readmission within the last 30 days:  No Current discharge risk:  Substance Abuse Barriers to Discharge:  Continued Medical Work up   [place], LCSW 02/11/2016, 1:37 PM

## 2016-02-11 NOTE — Care Management Important Message (Signed)
Important Message  Patient Details  Name: Larry Buckley MRN: [id number] Date of Birth: [birth year]   Medicare Important Message Given:  Yes    Eber HongGreene, Karrington Mccravy R, RN 02/11/2016, 5:03 PM

## 2016-02-11 NOTE — Progress Notes (Signed)
[facility]  Daily Progress Note   Subjective  - 3 Days Post-Op  Combative and confused overnight ETOH withdrawal at this point, improved with Ativan  Objective Filed Vitals:   02/11/16 0300 02/11/16 0400 02/11/16 0500 02/11/16 0600  BP: 144/88 163/87 141/70 141/72  Pulse:      Temp:      TempSrc:      Resp: 21 29 21 18   Height:      Weight:      SpO2:        Intake/Output Summary (Last 24 hours) at 02/11/16 0757 Last data filed at 02/11/16 0548  Gross per 24 hour  Intake    825 ml  Output   1125 ml  Net   -300 ml    PULM  CTAB CV  RRR VASC  Strong pedal pulses  Laboratory CBC    Component Value Date/Time   WBC 11.0* 02/11/2016 0439   HGB 8.8* 02/11/2016 0439   HCT 26.2* 02/11/2016 0439   PLT 159 02/11/2016 0439    BMET    Component Value Date/Time   NA 139 02/11/2016 0439   K 4.2 02/11/2016 0439   CL 108 02/11/2016 0439   CO2 23 02/11/2016 0439   GLUCOSE 105* 02/11/2016 0439   BUN 21* 02/11/2016 0439   CREATININE 0.87 02/11/2016 0439   CALCIUM 8.3* 02/11/2016 0439   GFRNONAA >60 02/11/2016 0439   GFRAA >60 02/11/2016 0439    Assessment/Planning: POD #3 s/p ruptured AAA repair   Cr normal, Hgb 8.8.    Abdomen mildly distended and full, no tenderness.  Try to give liquids today  Biggest issue is ETOH withdrawal.  Got 13 mg of Ativan last night, and resting now  Probably best to get him home when stable and assume he will resume ETOH consumption at home.  If he is able to eat today, may be able to discharge home later today    Tauriel Scronce  02/11/2016, 7:57 AM

## 2016-02-12 MED ORDER — LORAZEPAM 2 MG/ML IJ SOLN
2.0000 mg | INTRAMUSCULAR | Status: DC | PRN
  Administered 2016-02-13: 2 mg via INTRAVENOUS
  Filled 2016-02-12 (×4): qty 1

## 2016-02-12 MED ORDER — FOLIC ACID 1 MG PO TABS
1.0000 mg | ORAL_TABLET | Freq: Every day | ORAL | Status: DC
  Administered 2016-02-13 – 2016-02-17 (×5): 1 mg via ORAL
  Filled 2016-02-12 (×6): qty 1

## 2016-02-12 MED ORDER — QUETIAPINE FUMARATE 25 MG PO TABS
25.0000 mg | ORAL_TABLET | Freq: Every day | ORAL | Status: DC
  Administered 2016-02-12 – 2016-02-16 (×4): 25 mg via ORAL
  Filled 2016-02-12 (×6): qty 1

## 2016-02-12 NOTE — Progress Notes (Signed)
LCSW handed off information to other LCSW on 2nd floor.   Delta Air LinesClaudine Dail Lerew LCSW [phone]

## 2016-02-12 NOTE — Progress Notes (Signed)
LCSW checked in with patient this morning and he was eating and enjoying his breakfast. LCSW will check back in later this afternoon. Delta Air LinesClaudine Mallie Linnemann LCSW [phone]

## 2016-02-12 NOTE — Progress Notes (Signed)
Patient ID: Larry BickersWilliam H Buckley, male   DOB: 10/08/1941, 75 y.o.   MRN: [id number] [facility] PROGRESS NOTE  Larry Buckley VHQ:[id number] DOB: 10/08/1941 DOA: 02/08/2016 PCP: No primary care provider on file.  HPI/Subjective: Patient answers some yes and no questions. Offers no complaints.  Objective: Filed Vitals:   02/12/16 1000 02/12/16 1335  BP: 147/69 147/56  Pulse: 86   Temp:  99 F (37.2 C)  Resp: 22 22    Filed Weights   02/08/16 1547 02/08/16 2349  Weight: 86.183 kg (190 lb) 91.7 kg (202 lb 2.6 oz)    ROS: Review of Systems  Constitutional: Negative for fever and chills.  Eyes: Negative for blurred vision.  Respiratory: Negative for cough and shortness of breath.   Cardiovascular: Negative for chest pain.  Gastrointestinal: Positive for constipation. Negative for nausea, vomiting, abdominal pain and diarrhea.  Genitourinary: Negative for dysuria.  Musculoskeletal: Negative for joint pain.  Neurological: Negative for dizziness and headaches.   Exam: Physical Exam  HENT:  Nose: No mucosal edema.  Mouth/Throat: No oropharyngeal exudate or posterior oropharyngeal edema.  Eyes: Conjunctivae, EOM and lids are normal. Pupils are equal, round, and reactive to light.  Neck: No JVD present. Carotid bruit is not present. No edema present. No thyroid mass and no thyromegaly present.  Cardiovascular: S1 normal and S2 normal.  Exam reveals no gallop.   No murmur heard. Pulses:      Dorsalis pedis pulses are 2+ on the right side, and 2+ on the left side.  Respiratory: No respiratory distress. He has no wheezes. He has no rhonchi. He has no rales.  GI: Soft. Bowel sounds are normal. There is no tenderness.  Musculoskeletal:       Right ankle: He exhibits swelling.       Left ankle: He exhibits swelling.  Lymphadenopathy:    He has no cervical adenopathy.  Neurological: He is alert. No cranial nerve deficit.  No tremor when lifting his arm straight out. Able to lift legs  up off the chair when I asked.  Skin: Skin is warm. No rash noted. Nails show no clubbing.  Psychiatric: He has a normal mood and affect.      Data Reviewed: Basic Metabolic Panel:  Recent Labs Lab 02/09/16 0020 02/09/16 0549 02/09/16 1430 02/10/16 0526 02/11/16 0439  NA 137 135 139 142 139  K 4.9 5.3* 5.3* 4.8 4.2  CL 107 107 109 110 108  CO2 20* 20* 18* 24 23  GLUCOSE 144* 123* 121* 118* 105*  BUN 12 15 19 19  21*  CREATININE 0.96 1.16 1.47* 1.12 0.87  CALCIUM 7.6* 7.0* 7.5* 8.0* 8.3*   Liver Function Tests:  Recent Labs Lab 02/08/16 1604  AST 18  ALT 11*  ALKPHOS 105  BILITOT 1.0  PROT 8.0  ALBUMIN 3.5   CBC:  Recent Labs Lab 02/08/16 1604 02/09/16 0020 02/09/16 0549 02/09/16 1430 02/10/16 0526 02/11/16 0439  WBC 15.5* 14.2* 10.2 9.8 9.4 11.0*  NEUTROABS 12.2*  --   --   --   --   --   HGB 13.7 13.5 11.7* 11.5* 10.3* 8.8*  HCT 41.1 40.1 34.4* 33.6* 30.4* 26.2*  MCV 97.9 92.2 91.2 91.8 92.6 91.5  PLT 332 139* 117* 116* 131* 159   Cardiac Enzymes:  Recent Labs Lab 02/08/16 1604  TROPONINI <0.03     Recent Results (from the past 240 hour(s))  MRSA PCR Screening     Status: None   Collection Time: 02/09/16  1:14 AM  Result Value Ref Range Status   MRSA by PCR NEGATIVE NEGATIVE Final    Comment:        The GeneXpert MRSA Assay (FDA approved for NASAL specimens only), is one component of a comprehensive MRSA colonization surveillance program. It is not intended to diagnose MRSA infection nor to guide or monitor treatment for MRSA infections.       Scheduled Meds: . aspirin EC  81 mg Oral Daily  . docusate sodium  100 mg Oral Daily  . famotidine (PEPCID) IV  20 mg Intravenous Q12H  . [START ON 02/13/2016] folic acid  1 mg Oral Daily  . multivitamin with minerals  1 tablet Oral Daily  . QUEtiapine  25 mg Oral QHS  . thiamine  100 mg Oral Daily   Or  . thiamine  100 mg Intravenous Daily    Assessment/Plan:  1. Alcohol withdrawal  delirium with acute encephalopathy. Off Precedex drip since 3 AM. No tremor seen. Stable for transfer out to the floor. Will give Seroquel at night. 2. Aortic aneurysm rupture status post endovascular stent by Dr. Wyn Quaker. Stable for physical therapy to evaluate 3. Acute kidney injury secondary to ATN and volume loss. Improved 4. Hypokalemia improved 5. Postoperative ileus. No nausea vomiting. Advance to solid food. 6. Essential hypertension. Continue to monitor   Code Status:  Code Status History    This patient does not have a recorded code status. Please follow your organizational policy for patients in this situation.     Family Communication: Wife at the bedside Disposition Plan: To be determined  Consultants:  Vascular surgery  Procedures:  Endovascular stent  Time spent: 25 minutes  Alford Highland  [employer]

## 2016-02-12 NOTE — Progress Notes (Signed)
Received patient from CCU. Patient is sleep from sedation given in CCU. Vital signs taken. Sitter at the bedside. No acute distress noted.

## 2016-02-12 NOTE — Progress Notes (Signed)
 [facility]in [facility]  Daily Progress Note   Subjective  - 4 Days Post-Op  Much more clear today.  No N/V.  Eating first solid meal  Objective Filed Vitals:   02/12/16 0700 02/12/16 0800 02/12/16 1000 02/12/16 1335  BP:  142/62 147/69 147/56  Pulse: 93 85 86   Temp:  98.5 F (36.9 C)  99 F (37.2 C)  TempSrc:  Oral    Resp: 17 26 22 22   Height:      Weight:      SpO2: 100% 92% 95% 92%    Intake/Output Summary (Last 24 hours) at 02/12/16 1403 Last data filed at 02/12/16 1001  Gross per 24 hour  Intake 345.38 ml  Output      0 ml  Net 345.38 ml    PULM  CTAB CV  RRR VASC  Feet warm, good pulses  Laboratory CBC    Component Value Date/Time   WBC 11.0* 02/11/2016 0439   HGB 8.8* 02/11/2016 0439   HCT 26.2* 02/11/2016 0439   PLT 159 02/11/2016 0439    BMET    Component Value Date/Time   NA 139 02/11/2016 0439   K 4.2 02/11/2016 0439   CL 108 02/11/2016 0439   CO2 23 02/11/2016 0439   GLUCOSE 105* 02/11/2016 0439   BUN 21* 02/11/2016 0439   CREATININE 0.87 02/11/2016 0439   CALCIUM 8.3* 02/11/2016 0439   GFRNONAA >60 02/11/2016 0439   GFRAA >60 02/11/2016 0439    Assessment/Planning: POD #4 s/p endovascular AAA repair for ruptured AAA   Doing well  Eating without N/V.  Ileus resolved possibly  ETOH withdrawal. Much more clear today  Weakness.  PT for strength training.  May need rehab if doesn't improve    DEW,JASON  02/12/2016, 2:03 PM

## 2016-02-12 NOTE — Progress Notes (Signed)
After svn pt did better with oral secretions, did not require suctioning again this shift. Has been asleep all shift, but woke trying to get out of bed this AM. No indications of pain, discomfort or distress this shift.

## 2016-02-12 NOTE — Progress Notes (Signed)
Telephone report called to Clydie BraunKaren, Charity fundraiserN on 2C.  Patient to be transported via bed to room 212.

## 2016-02-12 NOTE — Progress Notes (Signed)
Pt is starting to get agitated with visible tremors present RN scored pt a 3 on the CIWA scale. However, there was no PRN medication with CIWA scale. Prime doctor was notified, new orders for Ativan 2 mg per CIWA. Will continue to monitor pt.   Karsten RoLauren E Hobbs

## 2016-02-13 LAB — CBC
HEMATOCRIT: 30.8 % — AB (ref 40.0–52.0)
Hemoglobin: 10.1 g/dL — ABNORMAL LOW (ref 13.0–18.0)
MCH: 30.9 pg (ref 26.0–34.0)
MCHC: 32.8 g/dL (ref 32.0–36.0)
MCV: 94.1 fL (ref 80.0–100.0)
Platelets: 188 10*3/uL (ref 150–440)
RBC: 3.28 MIL/uL — ABNORMAL LOW (ref 4.40–5.90)
RDW: 17.1 % — ABNORMAL HIGH (ref 11.5–14.5)
WBC: 10.4 10*3/uL (ref 3.8–10.6)

## 2016-02-13 LAB — GLUCOSE, CAPILLARY: GLUCOSE-CAPILLARY: 165 mg/dL — AB (ref 65–99)

## 2016-02-13 MED ORDER — THIAMINE HCL 100 MG/ML IJ SOLN: 100.0000 mg | Freq: Every day | INTRAMUSCULAR | Status: DC

## 2016-02-13 MED ORDER — LABETALOL HCL 5 MG/ML IV SOLN
10.0000 mg | INTRAVENOUS | Status: DC | PRN
  Administered 2016-02-13 – 2016-02-14 (×3): 10 mg via INTRAVENOUS
  Filled 2016-02-13 (×4): qty 4

## 2016-02-13 MED ORDER — LORAZEPAM 2 MG/ML IJ SOLN
1.0000 mg | Freq: Four times a day (QID) | INTRAMUSCULAR | Status: DC | PRN
  Filled 2016-02-13: qty 1

## 2016-02-13 MED ORDER — ADULT MULTIVITAMIN W/MINERALS CH: 1.0000 | ORAL_TABLET | Freq: Every day | ORAL | Status: DC

## 2016-02-13 MED ORDER — LORAZEPAM 2 MG/ML IJ SOLN: 0.0000 mg | Freq: Two times a day (BID) | INTRAMUSCULAR | Status: DC

## 2016-02-13 MED ORDER — LORAZEPAM 2 MG/ML IJ SOLN
0.0000 mg | Freq: Four times a day (QID) | INTRAMUSCULAR | Status: DC
  Administered 2016-02-14 (×2): 2 mg via INTRAVENOUS
  Filled 2016-02-13 (×3): qty 1

## 2016-02-13 MED ORDER — LORAZEPAM 2 MG/ML IJ SOLN
2.0000 mg | Freq: Once | INTRAMUSCULAR | Status: AC
  Administered 2016-02-13: 2 mg via INTRAVENOUS

## 2016-02-13 MED ORDER — FOLIC ACID 1 MG PO TABS: 1.0000 mg | ORAL_TABLET | Freq: Every day | ORAL | Status: DC

## 2016-02-13 MED ORDER — VITAMIN B-1 100 MG PO TABS: 100.0000 mg | ORAL_TABLET | Freq: Every day | ORAL | Status: DC

## 2016-02-13 MED ORDER — LORAZEPAM 2 MG/ML IJ SOLN
1.0000 mg | Freq: Once | INTRAMUSCULAR | Status: AC
  Administered 2016-02-13: 1 mg via INTRAVENOUS

## 2016-02-13 MED ORDER — LORAZEPAM 1 MG PO TABS: 1.0000 mg | ORAL_TABLET | Freq: Four times a day (QID) | ORAL | Status: DC | PRN

## 2016-02-13 NOTE — Clinical Social Work Note (Signed)
Clinical Social Work Assessment  Patient Details  Name: Larry Buckley MRN: [id number] Date of Birth: 1941/07/16  Date of referral:  02/11/16               Reason for consult:  Substance Use/ETOH Abuse                Permission sought to share information with:  Family Supports Permission granted to share information::  Yes, Verbal Permission Granted  Name::     Wife Kristion Holifield [phone] Son Saralyn Pilar [phone]  Agency::  na  Relationship::  yes  Contact Information:  yes  Housing/Transportation Living arrangements for the past 2 months:  Single Family Home Source of Information:  Adult Children, Spouse Patient Interpreter Needed:  None Criminal Activity/Legal Involvement Pertinent to Current Situation/Hospitalization:  No - Comment as needed Significant Relationships:  Adult Children, Spouse Lives with:  Spouse Do you feel safe going back to the place where you live?  Yes Need for family participation in patient care:  Yes (Comment)  Care giving concerns:  Patients wife very concerned about patients drinking   Social Worker assessment / plan: LCSW met with patient and he was not oriented to person,place, situation would respond to his name. Patient family ( Wife Decoda Van [phone] assisted with information and confirmed he does drink 8-12 beers all day and spends most of his time outdoors. He is very fatigued and dosing in and out of sleep. He was able to walk on his own and take care of himself before. He needs support with his ADL's at this time. He is insured w[employer]/Medicare and based on PT consult would be best if he received PT at [facility] for short term rehab.   Employment status:  Retired (Used to be a Barista) Forensic scientist:    PT Recommendations:  Not assessed at this time Information / Referral to community resources:  Outpatient Substance Abuse Treatment Options  Patient/Family's Response to care: Good  Patient/Family's Understanding of and  Emotional Response to Diagnosis, Current Treatment, and Prognosis:  They understand he needs some additional support in order for him to regain his strength  Emotional Assessment Appearance:  Appears stated age Attitude/Demeanor/Rapport:  Guarded, Combative Affect (typically observed):  Restless, Unable to Assess Orientation:  Oriented to Self Alcohol / Substance use:  Alcohol Use (12 beers per day or more every day) Psych involvement (Current and /or in the community):  No (Comment)  Discharge Needs  Concerns to be addressed:  Denies Needs/Concerns at this time, Substance Abuse Concerns Readmission within the last 30 days:  No Current discharge risk:  Substance Abuse Barriers to Discharge:  Continued Medical Work up   Joana Reamer, LCSW 02/13/2016, 12:48 PM

## 2016-02-13 NOTE — Progress Notes (Signed)
[facility]  Daily Progress Note   Subjective  - 5 Days Post-Op  Still with some mild confusion but calm and much more oriented. Has significant weakness in his all and unable to take a few steps. Physical therapy now recommending rehabilitation as it is not felt he is safe to be home.  Objective Filed Vitals:   02/12/16 1550 02/12/16 2009 02/13/16 0529 02/13/16 1459  BP: 158/72 142/57 135/63 179/74  Pulse: 89 95 78 100  Temp: 98.4 F (36.9 C) 98 F (36.7 C) 97.5 F (36.4 C) 99.9 F (37.7 C)  TempSrc:  Oral Oral Oral  Resp: 22 20 20    Height:      Weight:      SpO2: 99% 91% 100% 94%    Intake/Output Summary (Last 24 hours) at 02/13/16 1755 Last data filed at 02/13/16 1621  Gross per 24 hour  Intake    640 ml  Output   1025 ml  Net   -385 ml    PULM  CTAB CV  RRR VASC  Feet warm with good pulses. Incisions are clean and dry and intact.  Laboratory CBC    Component Value Date/Time   WBC 10.4 02/13/2016 0930   HGB 10.1* 02/13/2016 0930   HCT 30.8* 02/13/2016 0930   PLT 188 02/13/2016 0930    BMET    Component Value Date/Time   NA 139 02/11/2016 0439   K 4.2 02/11/2016 0439   CL 108 02/11/2016 0439   CO2 23 02/11/2016 0439   GLUCOSE 105* 02/11/2016 0439   BUN 21* 02/11/2016 0439   CREATININE 0.87 02/11/2016 0439   CALCIUM 8.3* 02/11/2016 0439   GFRNONAA >60 02/11/2016 0439   GFRAA >60 02/11/2016 0439    Assessment/Planning: POD #5 s/p ruptured abdominal aortic aneurysm repair with stent graft   Doing quite well from a medical point of view. Renal function has returned to normal. Hemoglobin is up over 10. Ileus from retroperitoneal blood has resolved and patient is now taking regular diet without nausea or vomiting.  Alcohol withdrawal which was a major holdup postoperatively seems to be improved and he is through the worst of it.   Physical therapy now recommending rehabilitation. Patient is medically stable for discharge and  we can try to get him transferred to a rehabilitation facility tomorrow.      Larry Buckley  02/13/2016, 5:55 PM

## 2016-02-13 NOTE — Progress Notes (Signed)
LCSW called Wife and she is in agreeable that patient should go to SNF and LCSW will complete Fl2 and [facility]. She will be in late afternoon. Delta Air LinesClaudine Jerilyn Gillaspie LCSW [phone]

## 2016-02-13 NOTE — Plan of Care (Signed)
Problem: Education: Goal: Knowledge of [facility] Education information/materials will improve Outcome: Not Progressing Pt has been very lethargic during shift. He has been alert to voice but then goes back to sleep. Will continue to monitor pt.      Problem: Safety: Goal: Ability to remain free from injury will improve Outcome: Progressing safety sitter at bedside throughout shift.

## 2016-02-13 NOTE — Plan of Care (Signed)
Problem: Education: Goal: Knowledge of [facility] Education information/materials will improve Outcome: Adequate for Discharge See new  Problem: Education: Goal: Knowledge of [facility] Education information/materials will improve Outcome: Not Progressing Pt confused  Problem: Health Behavior/Discharge Planning: Goal: Ability to manage health-related needs will improve Outcome: Not Progressing Pt confused

## 2016-02-13 NOTE — Progress Notes (Signed)
Pt.'s current BP is 178/71 and increasing agitation, CIWA score is 5, Ativan PRN 2mg  was given and was not effective . Doctor Anne HahnWillis was notified. New orders to give one time dose of Ativan 1mg  and to recheck BP after Ativan dose, Labetalol 10mg  IV PRN Q2 was placed for BP >160/100 if the ativan did not help BP. Will continue to assess.   Karsten RoLauren E Hobbs

## 2016-02-13 NOTE — NC FL2 (Signed)
[place] MEDICAID FL2 LEVEL OF CARE SCREENING TOOL     IDENTIFICATION  Patient Name: Larry Buckley Birthdate: 12-27-1940 Sex: male Admission Date (Current Location): 02/08/2016  [place] and [place] Number:  Chiropodist and Address:  [facility], [address], [place], [place] [address]      Provider Number: [id number]  Attending Physician Name and Address:  Renford Dills, MD  Relative Name and Phone Number:       Current Level of Care: Hospital Recommended Level of Care: Skilled Nursing Facility Prior Approval Number:    Date Approved/Denied:   PASRR Number:   [id number]  Discharge Plan: SNF    Current Diagnoses: Patient Active Problem List   Diagnosis Date Noted  . Ruptured abdominal aortic aneurysm (AAA) (HCC) 02/08/2016  . Hyperlipidemia   . Hypertension   . Gout, arthropathy     Orientation RESPIRATION BLADDER Height & Weight     Self  Normal Incontinent Weight: 202 lb 2.6 oz (91.7 kg) Height:   (188 cm)  BEHAVIORAL SYMPTOMS/MOOD NEUROLOGICAL BOWEL NUTRITION STATUS      Continent Diet (Normal)  AMBULATORY STATUS COMMUNICATION OF NEEDS Skin   Limited Assist Verbally Normal                       Personal Care Assistance Level of Assistance  Bathing, Feeding, Dressing, Total care Bathing Assistance: Maximum assistance Feeding assistance: Limited assistance Dressing Assistance: Maximum assistance Total Care Assistance: Maximum assistance   Functional Limitations Info  Sight, Hearing, Speech Sight Info: Adequate Hearing Info: Adequate Speech Info: Impaired    SPECIAL CARE FACTORS FREQUENCY  PT (By licensed PT)     PT Frequency: 5x              Contractures      Additional Factors Info  Allergies, Code Status Code Status Info: Full Allergies Info: Lotesin           Current Medications (02/13/2016):  This is the current hospital active medication list Current Facility-Administered  Medications  Medication Dose Route Frequency Provider Last Rate Last Dose  . acetaminophen (TYLENOL) tablet 325-650 mg  325-650 mg Oral Q4H PRN Annice Needy, MD       Or  . acetaminophen (TYLENOL) suppository 325-650 mg  325-650 mg Rectal Q4H PRN Annice Needy, MD      . alum & mag hydroxide-simeth (MAALOX/MYLANTA) 200-200-20 MG/5ML suspension 15-30 mL  15-30 mL Oral Q2H PRN Annice Needy, MD      . aspirin EC tablet 81 mg  81 mg Oral Daily Annice Needy, MD   81 mg at 02/13/16 0936  . docusate sodium (COLACE) capsule 100 mg  100 mg Oral Daily Annice Needy, MD   100 mg at 02/13/16 0936  . famotidine (PEPCID) IVPB 20 mg premix  20 mg Intravenous Q12H Erin Fulling, MD   20 mg at 02/13/16 0937  . folic acid (FOLVITE) tablet 1 mg  1 mg Oral Daily Alford Highland, MD   1 mg at 02/13/16 0936  . haloperidol lactate (HALDOL) injection 2-5 mg  2-5 mg Intravenous Q6H PRN Lewie Loron, NP   5 mg at 02/12/16 1432  . ipratropium-albuterol (DUONEB) 0.5-2.5 (3) MG/3ML nebulizer solution 3 mL  3 mL Nebulization Q6H PRN Oralia Manis, MD   3 mL at 02/12/16 0004  . LORazepam (ATIVAN) injection 2 mg  2 mg Intravenous Q4H PRN Katha Hamming, MD      .  multivitamin with minerals tablet 1 tablet  1 tablet Oral Daily Shane CrutchPradeep Ramachandran, MD   1 tablet at 02/13/16 0936  . ondansetron (ZOFRAN) injection 4 mg  4 mg Intravenous Q6H PRN Annice NeedyJason S Dew, MD      . oxyCODONE-acetaminophen (PERCOCET/ROXICET) 5-325 MG per tablet 1-2 tablet  1-2 tablet Oral Q4H PRN Annice NeedyJason S Dew, MD      . phenol (CHLORASEPTIC) mouth spray 1 spray  1 spray Mouth/Throat PRN Annice NeedyJason S Dew, MD      . QUEtiapine (SEROQUEL) tablet 25 mg  25 mg Oral QHS Alford Highlandichard Wieting, MD   25 mg at 02/12/16 2101  . thiamine (VITAMIN B-1) tablet 100 mg  100 mg Oral Daily Shane CrutchPradeep Ramachandran, MD   100 mg at 02/13/16 [id number]   Or  . thiamine (B-1) injection 100 mg  100 mg Intravenous Daily Shane CrutchPradeep Ramachandran, MD   100 mg at 02/10/16 1708     Discharge  Medications: Please see discharge summary for a list of discharge medications.  Relevant Imaging Results:  Relevant Lab Results:   Additional Information SSN [id number]  Cheron SchaumannBandi, Juancarlos Crescenzo M, [place]

## 2016-02-13 NOTE — Progress Notes (Signed)
Notified Dr Renae GlossWieting of increasing pt agitation; told Dr that pt given 5mg  haldol at 1507 and it has not been effective; Dr ordered 2 mg IV ativan once

## 2016-02-13 NOTE — Progress Notes (Signed)
Pt was lethargic throughout the night, pt was not combative and was mildly sweating. RN did not have to give ativan IV to pt. Will relay the message to oncoming RN.  Karsten RoLauren E Hobbs

## 2016-02-13 NOTE — Progress Notes (Signed)
LCSW sent out SNF short [place] rehab request to facilities [place] [facility] [place].Completed Fl2 and obtained new Passr number for pt. Will await bed offers and present them to his wife this afternoon.

## 2016-02-13 NOTE — Progress Notes (Signed)
Patient ID: Larry Buckley, male   DOB: 04-29-1941, 75 y.o.   MRN: [id number]  [facility] PROGRESS NOTE  PAARTH CROPPER ION:[id number] DOB: Jun 07, 1941 DOA: 02/08/2016 PCP: No primary care provider on file.  HPI/Subjective: Patient answers some yes and no questions. Offers no complaints.  Objective: Filed Vitals:   02/12/16 2009 02/13/16 0529  BP: 142/57 135/63  Pulse: 95 78  Temp: 98 F (36.7 C) 97.5 F (36.4 C)  Resp: 20 20    Filed Weights   02/08/16 1547 02/08/16 2349  Weight: 86.183 kg (190 lb) 91.7 kg (202 lb 2.6 oz)    ROS: Review of Systems  Constitutional: Negative for fever and chills.  Eyes: Negative for blurred vision.  Respiratory: Negative for cough and shortness of breath.   Cardiovascular: Negative for chest pain.  Gastrointestinal: Positive for constipation. Negative for nausea, vomiting, abdominal pain and diarrhea.  Genitourinary: Negative for dysuria.  Musculoskeletal: Negative for joint pain.  Neurological: Negative for dizziness and headaches.   Exam: Physical Exam  HENT:  Nose: No mucosal edema.  Mouth/Throat: No oropharyngeal exudate or posterior oropharyngeal edema.  Eyes: Conjunctivae, EOM and lids are normal. Pupils are equal, round, and reactive to light.  Neck: No JVD present. Carotid bruit is not present. No edema present. No thyroid mass and no thyromegaly present.  Cardiovascular: S1 normal and S2 normal.  Exam reveals no gallop.   No murmur heard. Pulses:      Dorsalis pedis pulses are 2+ on the right side, and 2+ on the left side.  Respiratory: No respiratory distress. He has no wheezes. He has no rhonchi. He has no rales.  GI: Soft. Bowel sounds are normal. There is no tenderness.  Musculoskeletal:       Right ankle: He exhibits swelling.       Left ankle: He exhibits swelling.  Lymphadenopathy:    He has no cervical adenopathy.  Neurological: He is alert. No cranial nerve deficit.  No tremor when lifting his arm straight  out. Able to lift legs up off the chair when I asked.  Skin: Skin is warm. No rash noted. Nails show no clubbing.  Psychiatric: He has a normal mood and affect.      Data Reviewed: Basic Metabolic Panel:  Recent Labs Lab 02/09/16 0020 02/09/16 0549 02/09/16 1430 02/10/16 0526 02/11/16 0439  NA 137 135 139 142 139  K 4.9 5.3* 5.3* 4.8 4.2  CL 107 107 109 110 108  CO2 20* 20* 18* 24 23  GLUCOSE 144* 123* 121* 118* 105*  BUN 21*  CREATININE 0.96 1.16 1.47* 1.12 0.87  CALCIUM 7.6* 7.0* 7.5* 8.0* 8.3*   Liver Function Tests:  Recent Labs Lab 02/08/16 1604  AST 18  ALT 11*  ALKPHOS 105  BILITOT 1.0  PROT 8.0  ALBUMIN 3.5   CBC:  Recent Labs Lab 02/08/16 1604  02/09/16 0549 02/09/16 1430 02/10/16 0526 02/11/16 0439 02/13/16 0930  WBC 15.5*  < > 10.2 9.8 9.4 11.0* 10.4  NEUTROABS 12.2*  --   --   --   --   --   --   HGB 13.7  < > 11.7* 11.5* 10.3* 8.8* 10.1*  HCT 41.1  < > 34.4* 33.6* 30.4* 26.2* 30.8*  MCV 97.9  < > 91.2 91.8 92.6 91.5 94.1  PLT 332  < > 117* 116* 131* 159 188  < > = values in this interval not displayed. Cardiac Enzymes:  Recent Labs Lab  02/08/16 1604  TROPONINI <0.03     Recent Results (from the past 240 hour(s))  MRSA PCR Screening     Status: None   Collection Time: 02/09/16  1:14 AM  Result Value Ref Range Status   MRSA by PCR NEGATIVE NEGATIVE Final    Comment:        The GeneXpert MRSA Assay (FDA approved for NASAL specimens only), is one component of a comprehensive MRSA colonization surveillance program. It is not intended to diagnose MRSA infection nor to guide or monitor treatment for MRSA infections.       Scheduled Meds: . aspirin EC  81 mg Oral Daily  . docusate sodium  100 mg Oral Daily  . famotidine (PEPCID) IV  20 mg Intravenous Q12H  . folic acid  1 mg Oral Daily  . multivitamin with minerals  1 tablet Oral Daily  . QUEtiapine  25 mg Oral QHS  . thiamine  100 mg Oral Daily   Or  .  thiamine  100 mg Intravenous Daily    Assessment/Plan:  1. Alcohol withdrawal delirium with acute encephalopathy. Patient through withdrawal at this point. Gave Seroquel at night for sleep. 2. Aortic aneurysm rupture status post endovascular stent by Dr. Wyn Quakerew.  3. Acute kidney injury secondary to ATN and volume loss. Improved 4. Hypokalemia improved 5. Postoperative ileus. No nausea vomiting. Advance to solid food. 6. Essential hypertension. Continue to monitor 7. Weakness. Physical therapy recommended rehabilitation.   Code Status:  Code Status History    This patient does not have a recorded code status. Please follow your organizational policy for patients in this situation.     Family Communication: Spoke with wife yesterday Disposition Plan: Physical therapy recommended rehabilitation. We'll get Administrator, Civil Servicesocial worker consultation. Potential out of the hospital to rehabilitation Monday depending on bed availability.  Consultants:  Vascular surgery  Procedures:  Endovascular stent  Time spent: 24 minutes  Alford HighlandWIETING, Lenna Hagarty  [employer]

## 2016-02-13 NOTE — Plan of Care (Signed)
Problem: Education: Goal: Knowledge of [facility] Education information/materials will improve Outcome: Not Progressing Pt confused   

## 2016-02-13 NOTE — Progress Notes (Signed)
Physical Therapy Treatment Patient Details Name: Larry Buckley MRN: [id number] DOB: 1941/05/25 Today's Date: 02/13/2016    History of Present Illness Pt is a 75 y.o. M admitted to hospital for low back pain and nausea. Pt diagnosed with ruptured abdominal aorta aneurysm. Pt received repair surgery on 4/25. Pt intubatd at time of surgery and extubated on 4/26. Pt has hx of HTN, hyperlipidemia, gout and alcoholism.     PT Comments    Patient demonstrates significant improvements in mobility since last PT session. Patient oriented x2 but mildly confused with verbal commands initially. Patient demonstrates lack of safety/spatial awareness, wanting to continue gait training despite deficits. Patient able to ambulate 30' and 20' respectively following seated rest break. HR stayed WNL. Patient will continue to benefit from progressive dynamic balance/gait training as tolerated to help return to PLOF. Current plan still appropriate.  Follow Up Recommendations  SNF     Equipment Recommendations  Rolling walker with 5" wheels    Recommendations for Other Services       Precautions / Restrictions Precautions Precautions: Fall Restrictions Weight Bearing Restrictions: No    Mobility  Bed Mobility                  Transfers Overall transfer level: Needs assistance Equipment used: Rolling walker (2 wheeled) Transfers: Sit to/from Stand Sit to Stand: Min assist         General transfer comment: First sit to stand transfer required moderate assistance, as patient kept knees flexed and couldn't follow cues. After sitting and reinstructing in the proper sequencing, patient was able to perform with minA.  Ambulation/Gait Ambulation/Gait assistance: Min assist Ambulation Distance (Feet): 30 Feet Assistive device: Rolling walker (2 wheeled)       General Gait Details: Patient ambulated 78' with RW. Demonstrates decreased spatial awareness and required verbal cues to improve  posture and increase step length. Was able to perform 20' of gait additionallly after seated rest break.   Stairs            Wheelchair Mobility    Modified Rankin (Stroke Patients Only)       Balance Overall balance assessment: Needs assistance Sitting-balance support: Feet supported Sitting balance-Leahy Scale: Fair     Standing balance support: Bilateral upper extremity supported Standing balance-Leahy Scale: Fair                      Cognition Arousal/Alertness: Awake/alert Behavior During Therapy: WFL for tasks assessed/performed;Flat affect Overall Cognitive Status: Difficult to assess                      Exercises      General Comments        Pertinent Vitals/Pain Pain Assessment: Faces Faces Pain Scale: Hurts whole lot Pain Location: Back Pain Descriptors / Indicators: Aching Pain Intervention(s): Limited activity within patient's tolerance;Monitored during session    Home Living                      Prior Function            PT Goals (current goals can now be found in the care plan section) Acute Rehab PT Goals Patient Stated Goal: to return to PLOF PT Goal Formulation: With patient Time For Goal Achievement: 02/25/16 Potential to Achieve Goals: Good Progress towards PT goals: Progressing toward goals    Frequency  Min 2X/week    PT Plan Current plan remains appropriate  Co-evaluation             End of Session Equipment Utilized During Treatment: Gait belt Activity Tolerance: Patient tolerated treatment well Patient left: in chair;with call bell/phone within reach;with nursing/sitter in room     Time: 1212-1235 PT Time Calculation (min) (ACUTE ONLY): 23 min  Charges:  $Gait Training: 23-37 mins                    G Codes:      Neita CarpJulie Ann Devin Foskey, PT, DPT 02/13/2016, 1:16 PM

## 2016-02-13 NOTE — Progress Notes (Signed)
LCSW attempted to meet with patient and left a note on SNF handout for family to call me direct. This worker consulted with floor nurse. He will call me as well when family arrives.  Delta Air LinesClaudine Evan Osburn LCSW [phone]

## 2016-02-14 ENCOUNTER — Inpatient Hospital Stay: Payer: Medicare Other

## 2016-02-14 IMAGING — DX DG CHEST 1V
1 series · 1 of 1 positions shown · non-contrast
Comparison: [DATE]

CLINICAL DATA: Patient presents to emergency department with
several day history of back pain.

EXAM:
CHEST 1 VIEW

[chest ap]
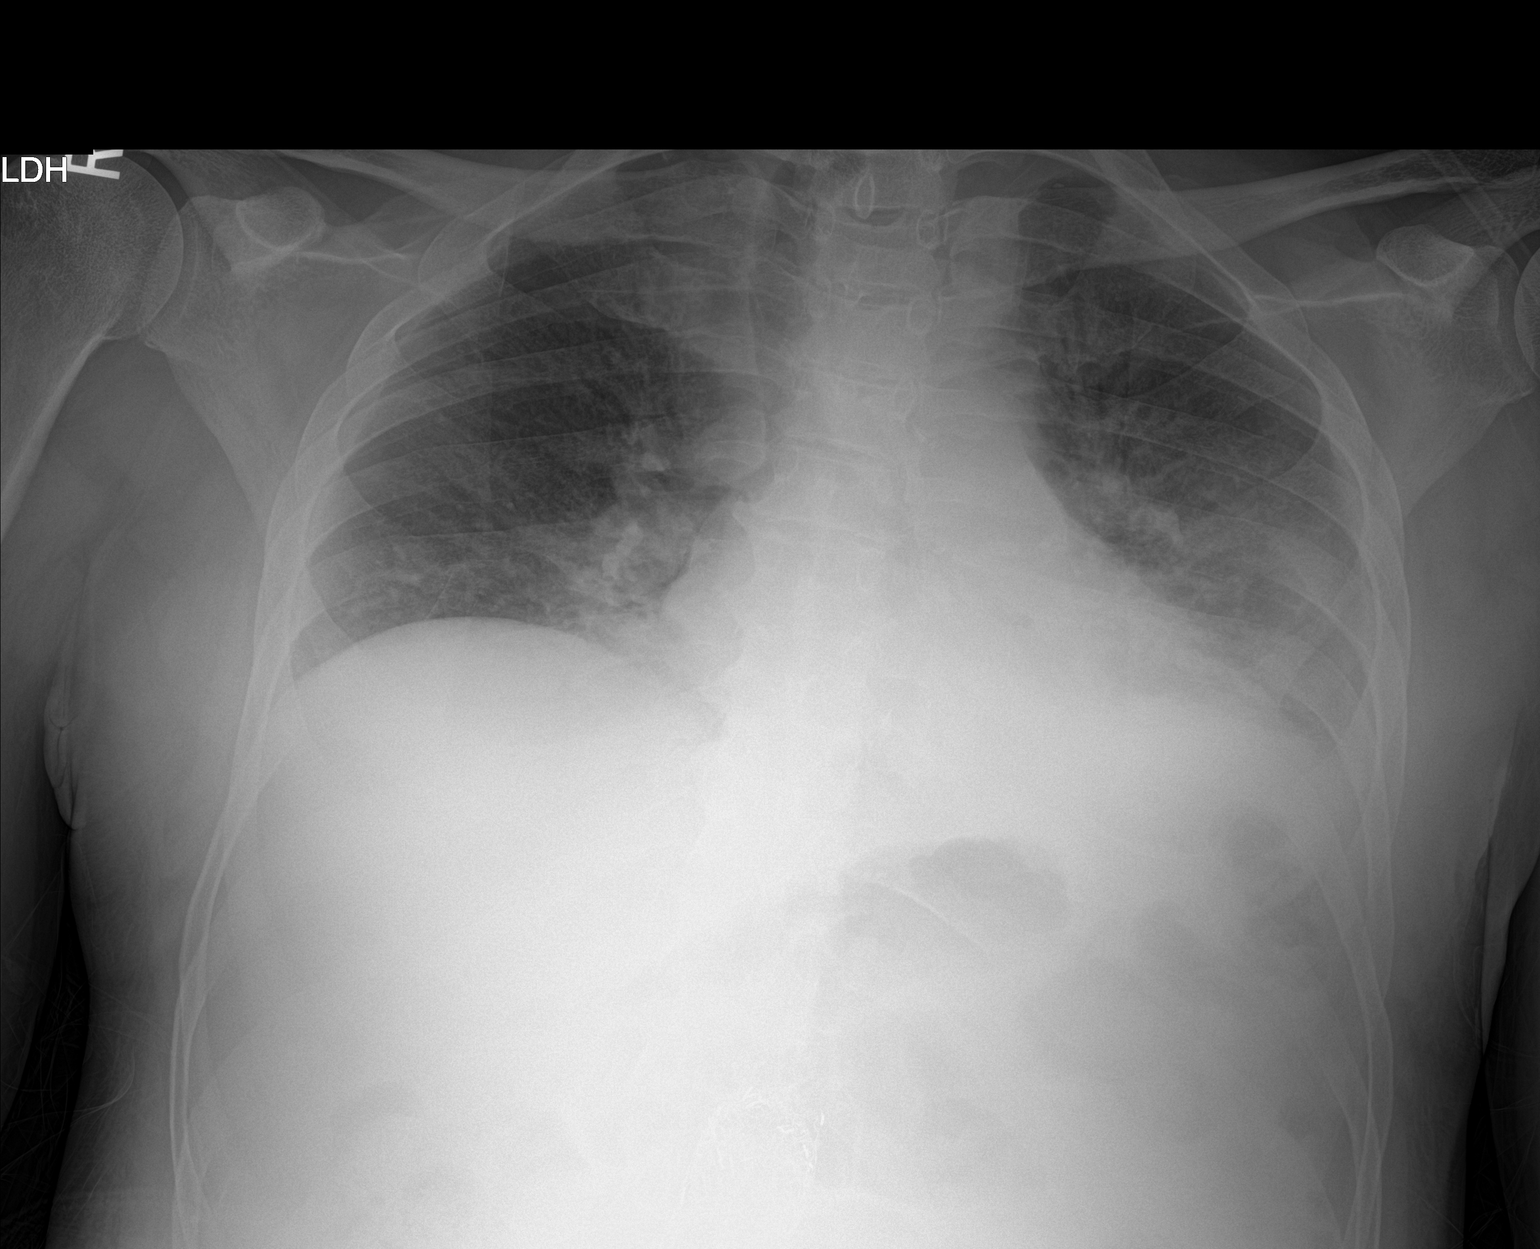

[1 of 1 positions shown; findings below may reference images not displayed]

FINDINGS: Normal heart size. There has been removal of the ET tube. The lung
volumes are low. Airspace consolidation within the left base is
identified and appears increased from previous exam.
IMPRESSION: 1. Worsening aeration to the left base compatible with pneumonia.
Followup PA and lateral chest X-ray is recommended in 3-4 weeks
following trial of antibiotic therapy to ensure resolution and
exclude underlying malignancy.

## 2016-02-14 MED ORDER — FAMOTIDINE 20 MG PO TABS
20.0000 mg | ORAL_TABLET | Freq: Two times a day (BID) | ORAL | Status: DC
  Administered 2016-02-14: 20 mg via ORAL
  Filled 2016-02-14 (×2): qty 1

## 2016-02-14 MED ORDER — IPRATROPIUM-ALBUTEROL 0.5-2.5 (3) MG/3ML IN SOLN
3.0000 mL | Freq: Four times a day (QID) | RESPIRATORY_TRACT | Status: DC
  Administered 2016-02-14 – 2016-02-15 (×5): 3 mL via RESPIRATORY_TRACT
  Filled 2016-02-14 (×5): qty 3

## 2016-02-14 MED ORDER — ENSURE ENLIVE PO LIQD
237.0000 mL | Freq: Two times a day (BID) | ORAL | Status: DC
  Administered 2016-02-14 – 2016-02-17 (×5): 237 mL via ORAL

## 2016-02-14 NOTE — Progress Notes (Signed)
Physical Therapy Treatment Patient Details Name: Larry Buckley MRN: [id number] DOB: January 02, 1941 Today's Date: 02/14/2016    History of Present Illness Pt is a 75 y.o. M admitted to hospital for low back pain and nausea. Pt diagnosed with ruptured abdominal aorta aneurysm. Pt received repair surgery on 4/25. Pt intubatd at time of surgery and extubated on 4/26. Pt has hx of HTN, hyperlipidemia, gout and alcoholism.     PT Comments    Discussed with nurse prior to session and received approval for treatment.  Pt in bed.  Lethargic but agrees to participate.  Sat eon edge of bed with mod a x 2.  Unable to maintain sitting balance independently.   After several minutes with continued attempts to increase alertness, he was able to stand at bedside x 2 with mod a x 2 to stand and min a x 2 to maintain standing balance.  Poor hand placements with transfers and on walker.  Attempted to transfer to chair at bedside x 2 but he was unable to pick up his feet to sidestep or march in place.  He was assisted back to supine and reported feeling tired.     Follow Up Recommendations  SNF     Equipment Recommendations  Rolling walker with 5" wheels    Recommendations for Other Services       Precautions / Restrictions Precautions Precautions: Fall Restrictions Weight Bearing Restrictions: No    Mobility  Bed Mobility Overal bed mobility: +2 for physical assistance             General bed mobility comments:  (difficulty following verbal commands)  Transfers Overall transfer level: Needs assistance Equipment used: Rolling walker (2 wheeled) Transfers: Sit to/from Stand Sit to Stand: Mod assist;+2 physical assistance         General transfer comment: difficulty following verbal cues  Ambulation/Gait Ambulation/Gait assistance: +2 physical assistance (unable to step or transfer to chair today)   Assistive device: Rolling walker (2 wheeled)           Stairs             Wheelchair Mobility    Modified Rankin (Stroke Patients Only)       Balance Overall balance assessment: Needs assistance Sitting-balance support: Feet supported Sitting balance-Leahy Scale: Poor     Standing balance support: Bilateral upper extremity supported Standing balance-Leahy Scale: Poor                      Cognition Arousal/Alertness: Lethargic Behavior During Therapy: WFL for tasks assessed/performed;Flat affect Overall Cognitive Status: Difficult to assess                      Exercises      General Comments        Pertinent Vitals/Pain Pain Assessment: No/denies pain    Home Living                      Prior Function            PT Goals (current goals can now be found in the care plan section) Progress towards PT goals: Not progressing toward goals - comment (limited by lethargy)    Frequency  Min 2X/week    PT Plan Current plan remains appropriate    Co-evaluation             End of Session Equipment Utilized During Treatment: Gait belt;Oxygen Activity Tolerance: Patient limited by  lethargy Patient left: in bed;with call bell/phone within reach;with bed alarm set;with nursing/sitter in room;with family/visitor present     Time: 4098-1191 PT Time Calculation (min) (ACUTE ONLY): 32 min  Charges:  $Therapeutic Exercise: 8-22 mins $Therapeutic Activity: 8-22 mins                    G Codes:      Danielle Dess, PTA 02/14/2016, 12:39 PM

## 2016-02-14 NOTE — Progress Notes (Signed)
PHARMACIST - PHYSICIAN COMMUNICATION  CONCERNING: IV to Oral Route Change Policy  RECOMMENDATION: This patient is receiving famotidine by the intravenous route.  Based on criteria approved by the Pharmacy and Therapeutics Committee, the intravenous medication(s) is/are being converted to the equivalent oral dose form(s).   DESCRIPTION: These criteria include:  The patient is eating (either orally or via tube) and/or has been taking other orally administered medications for a least 24 hours  The patient has no evidence of active gastrointestinal bleeding or impaired GI absorption (gastrectomy, short bowel, patient on TNA or NPO).  If you have questions about this conversion, please contact the Pharmacy Department  []   [phone]( [phone] )  [facility] [x]   [phone]( [phone] )  [facility] []   [phone]( [phone] )  [facility] []   [phone]( [phone] )  [facility] []   [phone]( [phone] )  [facility]   Marty HeckWang, Gabbriella Presswood L, [facility] HospitalRPH 02/14/2016 9:39 AM

## 2016-02-14 NOTE — Progress Notes (Signed)
[facility]  Daily Progress Note   Subjective  - 6 Days Post-Op  More confused today.  Got more ativan last night AF/VSS Eating OK  Objective Filed Vitals:   02/14/16 0614 02/14/16 1111 02/14/16 1129 02/14/16 1208  BP:  168/82 149/77   Pulse: 81 87 88   Temp: 98.5 F (36.9 C) 97.4 F (36.3 C)    TempSrc:      Resp: 18 19    Height:      Weight:      SpO2: 100% 100%  95%    Intake/Output Summary (Last 24 hours) at 02/14/16 1443 Last data filed at 02/14/16 1125  Gross per 24 hour  Intake    700 ml  Output    350 ml  Net    350 ml    PULM  CTAB CV  RRR VASC  Feet warm, good pulses, abdomen soft  Laboratory CBC    Component Value Date/Time   WBC 10.4 02/13/2016 0930   HGB 10.1* 02/13/2016 0930   HCT 30.8* 02/13/2016 0930   PLT 188 02/13/2016 0930    BMET    Component Value Date/Time   NA 139 02/11/2016 0439   K 4.2 02/11/2016 0439   CL 108 02/11/2016 0439   CO2 23 02/11/2016 0439   GLUCOSE 105* 02/11/2016 0439   BUN 21* 02/11/2016 0439   CREATININE 0.87 02/11/2016 0439   CALCIUM 8.3* 02/11/2016 0439   GFRNONAA >60 02/11/2016 0439   GFRAA >60 02/11/2016 0439    Assessment/Planning: POD #6 s/p ruptured AAA repair   More confused today and still requiring a sitter  Cant go to SNF until no sitter needed  Try other medicines other than ativan to try to help mental status  Otherwise, is medically ready for discharge    DEW,JASON  02/14/2016, 2:43 PM

## 2016-02-14 NOTE — Progress Notes (Signed)
Patient ID: Larry Buckley Xiong, male   DOB: December 05, 1940, 75 y.o.   MRN: [id number]  [facility] PROGRESS NOTE  Larry Buckley Ion NWG:[id number] DOB: December 05, 1940 DOA: 02/08/2016 PCP: No primary care provider on file.  HPI/Subjective: Patient more lethargic today with medication last night. Patient has a sitter  Objective: Filed Vitals:   02/14/16 0506 02/14/16 0614  BP: 156/62   Pulse:  81  Temp:  98.5 F (36.9 C)  Resp:  18    Filed Weights   02/08/16 1547 02/08/16 2349  Weight: 86.183 kg (190 lb) 91.7 kg (202 lb 2.6 oz)    ROS: Review of Systems  Unable to perform ROS secondary to altered mental status Exam: Physical Exam  Constitutional: He appears lethargic.  HENT:  Nose: No mucosal edema.  Mouth/Throat: No oropharyngeal exudate or posterior oropharyngeal edema.  Eyes: Conjunctivae and lids are normal. Pupils are equal, round, and reactive to light.  Neck: No JVD present. Carotid bruit is not present. No edema present. No thyroid mass and no thyromegaly present.  Cardiovascular: S1 normal and S2 normal.  Exam reveals no gallop.   No murmur heard. Pulses:      Dorsalis pedis pulses are 2+ on the right side, and 2+ on the left side.  Respiratory: No respiratory distress. He has no wheezes. He has no rhonchi. He has no rales.  GI: Soft. Bowel sounds are normal. There is no tenderness.  Musculoskeletal:       Right ankle: He exhibits swelling.       Left ankle: He exhibits swelling.  Lymphadenopathy:    He has no cervical adenopathy.  Neurological: He appears lethargic.  Able to lift legs to commands  Skin: Skin is warm. No rash noted. Nails show no clubbing.  Psychiatric: His affect is blunt.      Data Reviewed: Basic Metabolic Panel:  Recent Labs Lab 02/09/16 0020 02/09/16 0549 02/09/16 1430 02/10/16 0526 02/11/16 0439  NA 137 135 139 142 139  K 4.9 5.3* 5.3* 4.8 4.2  CL 107 107 109 110 108  CO2 20* 20* 18* 24 23  GLUCOSE 144* 123* 121* 118* 105*  BUN  12 15 19 19  21*  CREATININE 0.96 1.16 1.47* 1.12 0.87  CALCIUM 7.6* 7.0* 7.5* 8.0* 8.3*   Liver Function Tests:  Recent Labs Lab 02/08/16 1604  AST 18  ALT 11*  ALKPHOS 105  BILITOT 1.0  PROT 8.0  ALBUMIN 3.5   CBC:  Recent Labs Lab 02/08/16 1604  02/09/16 0549 02/09/16 1430 02/10/16 0526 02/11/16 0439 02/13/16 0930  WBC 15.5*  < > 10.2 9.8 9.4 11.0* 10.4  NEUTROABS 12.2*  --   --   --   --   --   --   HGB 13.7  < > 11.7* 11.5* 10.3* 8.8* 10.1*  HCT 41.1  < > 34.4* 33.6* 30.4* 26.2* 30.8*  MCV 97.9  < > 91.2 91.8 92.6 91.5 94.1  PLT 332  < > 117* 116* 131* 159 188  < > = values in this interval not displayed. Cardiac Enzymes:  Recent Labs Lab 02/08/16 1604  TROPONINI <0.03     Recent Results (from the past 240 hour(s))  MRSA PCR Screening     Status: None   Collection Time: 02/09/16  1:14 AM  Result Value Ref Range Status   MRSA by PCR NEGATIVE NEGATIVE Final    Comment:        The GeneXpert MRSA Assay (FDA approved for NASAL specimens only),  is one component of a comprehensive MRSA colonization surveillance program. It is not intended to diagnose MRSA infection nor to guide or monitor treatment for MRSA infections.       Scheduled Meds: . aspirin EC  81 mg Oral Daily  . docusate sodium  100 mg Oral Daily  . famotidine (PEPCID) IV  20 mg Intravenous Q12H  . folic acid  1 mg Oral Daily  . ipratropium-albuterol  3 mL Nebulization Q6H  . multivitamin with minerals  1 tablet Oral Daily  . QUEtiapine  25 mg Oral QHS  . thiamine  100 mg Oral Daily   Or  . thiamine  100 mg Intravenous Daily    Assessment/Plan:  1. Alcohol withdrawal delirium with acute encephalopathy. Patient through withdrawal at this point. Try not to give anymore ativan because he gets more lethargic.  Haldol prn, seroquel at night.  Patient need to be without sitter for 24 hours prior to discharge to rehab 2. Wheeze- get cxr, nebulizers and swallow eval 3. Aortic aneurysm  rupture status post endovascular stent by Dr. Wyn Quaker.  4. Acute kidney injury secondary to ATN and volume loss. Improved 5. Hypokalemia improved 6. Postoperative ileus. No nausea vomiting. Advance to solid food. 7. Essential hypertension. Continue to monitor 8. Weakness. Physical therapy recommended rehabilitation.   Code Status:  Code Status History    This patient does not have a recorded code status. Please follow your organizational policy for patients in this situation.     Family Communication: Spoke with wife today Disposition Plan: Physical therapy recommended rehabilitation. We'll get Administrator, Civil Service. Needs to be without sitter for 24 hours prior to disposition  Consultants:  Vascular surgery  Procedures:  Endovascular stent  Time spent: 22 minutes  Alford Highland  [employer]

## 2016-02-14 NOTE — Progress Notes (Signed)
Pt.'s current temp is 102.9 oral. PRN tylenol suppository  650mg  was given to pt. He would not cooperate with taking pills at this time. Will recheck temp in 30 minutes to a hour.   Karsten RoLauren E Hobbs

## 2016-02-14 NOTE — Progress Notes (Signed)
Speech Therapy Note: received order, reviewed chart notes, consulted NSG and MD re: pt's status today. Pt is sleepy/lethargic, currently snoring heavily after some medication last night for agitation. Pt is on CIWA protocol d/t withdrawal at this time per MD note. NSG staff and family stated pt was more awake briefly this AM and ate some breakfast. No overt s/s of aspiration were noted then. NSG staff and family stated they "kept him awake" while he was eating. Discussed general aspiration precautions and to NOT feed pt is he cannot remain awake/alert and interact w/ others during oral intake. Wife stated she felt "fine" w/ him having meals while being "watched"; Sitter present in room. Wife also stated she wanted him to have Ensure; MD made aware.  SLP will f/u w/ pt's assessment at a meal tomorrow. Agree w/ MD to hold on sedating medications as lethargy can impact safety w/ any oral intake. Pt remains on a regular diet at this time w/ general aspiration precautions; monitoring at meals currently.

## 2016-02-14 NOTE — Progress Notes (Signed)
Pt current temp is 99.4 oral. Will continue to monitor pt.  Karsten RoLauren E Hobbs

## 2016-02-14 NOTE — Care Management Important Message (Signed)
Important Message  Patient Details  Name: Larry BickersWilliam H Buckley MRN: [id number] Date of Birth: 1941/03/25   Medicare Important Message Given:  Yes    Chapman FitchBOWEN, Jude Linck T, RN 02/14/2016, 10:40 AM

## 2016-02-15 MED ORDER — PIPERACILLIN-TAZOBACTAM 3.375 G IVPB
3.3750 g | Freq: Three times a day (TID) | INTRAVENOUS | Status: DC
  Administered 2016-02-15 – 2016-02-17 (×6): 3.375 g via INTRAVENOUS
  Filled 2016-02-15 (×9): qty 50

## 2016-02-15 NOTE — Clinical Social Work Note (Signed)
Bed offers extended to patient's wife who was visiting patient today in his hospital room. Patient's wife has chosen [facility]. Patient will need to be sitter free for 24 hours before patient can transfer to skilled rehab. York SpanielMonica Asher Babilonia MSW,LCSW [phone]

## 2016-02-15 NOTE — Plan of Care (Signed)
Problem: Safety: Goal: Ability to remain free from injury will improve Safety sitter at bedside throughout shift. Will reassess pt to see if safety sitter is needed throughout the day shift.

## 2016-02-15 NOTE — Progress Notes (Signed)
Patient ID: Larry BickersWilliam H Buckley, male   DOB: [birth year], 75 y.o.   MRN: [id number]  [facility] PROGRESS NOTE  Larry BickersWilliam H Buckley MVH:[id number] DOB: [birth year] DOA: 02/08/2016 PCP: No primary care provider on file.  HPI/Subjective: Patient still with sitter and pulled out IV this morning. Patient answers some yes or no questions appropriately and seems a little less lethargic than yesterday. Unable to elaborate any other complaints  Objective: Filed Vitals:   02/15/16 0436 02/15/16 0712  BP: 138/80 148/68  Pulse: 87 88  Temp: 99.2 F (37.3 C) 98.6 F (37 C)  Resp: 24 18    Filed Weights   02/08/16 1547 02/08/16 2349  Weight: 86.183 kg (190 lb) 91.7 kg (202 lb 2.6 oz)    ROS: Review of Systems  Respiratory: Positive for cough. Negative for shortness of breath.   Cardiovascular: Negative for chest pain.  Gastrointestinal: Negative for vomiting.  Limited secondary to altered mental status Exam: Physical Exam  HENT:  Nose: No mucosal edema.  Mouth/Throat: No oropharyngeal exudate or posterior oropharyngeal edema.  Eyes: Conjunctivae and lids are normal. Pupils are equal, round, and reactive to light.  Neck: No JVD present. Carotid bruit is not present. No edema present. No thyroid mass and no thyromegaly present.  Cardiovascular: S1 normal and S2 normal.  Exam reveals no gallop.   No murmur heard. Pulses:      Dorsalis pedis pulses are 2+ on the right side, and 2+ on the left side.  Respiratory: No respiratory distress. He has no wheezes. He has rhonchi in the right lower field and the left lower field. He has no rales.  GI: Soft. Bowel sounds are normal. There is no tenderness.  Musculoskeletal:       Right ankle: He exhibits swelling.       Left ankle: He exhibits swelling.  Lymphadenopathy:    He has no cervical adenopathy.  Neurological: He is alert.  Able to open mouth to command. Moves his arms on his own.  Skin: Skin is warm. No rash noted. Nails show no clubbing.   Psychiatric: His affect is blunt.      Data Reviewed: Basic Metabolic Panel:  Recent Labs Lab 02/09/16 0020 02/09/16 0549 02/09/16 1430 02/10/16 0526 02/11/16 0439  NA 137 135 139 142 139  K 4.9 5.3* 5.3* 4.8 4.2  CL 107 107 109 110 108  CO2 20* 20* 18* 24 23  GLUCOSE 144* 123* 121* 118* 105*  BUN 12 15 19 19  21*  CREATININE 0.96 1.16 1.47* 1.12 0.87  CALCIUM 7.6* 7.0* 7.5* 8.0* 8.3*   Liver Function Tests:  Recent Labs Lab 02/08/16 1604  AST 18  ALT 11*  ALKPHOS 105  BILITOT 1.0  PROT 8.0  ALBUMIN 3.5   CBC:  Recent Labs Lab 02/08/16 1604  02/09/16 0549 02/09/16 1430 02/10/16 0526 02/11/16 0439 02/13/16 0930  WBC 15.5*  < > 10.2 9.8 9.4 11.0* 10.4  NEUTROABS 12.2*  --   --   --   --   --   --   HGB 13.7  < > 11.7* 11.5* 10.3* 8.8* 10.1*  HCT 41.1  < > 34.4* 33.6* 30.4* 26.2* 30.8*  MCV 97.9  < > 91.2 91.8 92.6 91.5 94.1  PLT 332  < > 117* 116* 131* 159 188  < > = values in this interval not displayed. Cardiac Enzymes:  Recent Labs Lab 02/08/16 1604  TROPONINI <0.03     Recent Results (from the past 240 hour(s))  MRSA PCR Screening     Status: None   Collection Time: 02/09/16  1:14 AM  Result Value Ref Range Status   MRSA by PCR NEGATIVE NEGATIVE Final    Comment:        The GeneXpert MRSA Assay (FDA approved for NASAL specimens only), is one component of a comprehensive MRSA colonization surveillance program. It is not intended to diagnose MRSA infection nor to guide or monitor treatment for MRSA infections.       Scheduled Meds: . aspirin EC  81 mg Oral Daily  . docusate sodium  100 mg Oral Daily  . feeding supplement (ENSURE ENLIVE)  237 mL Oral BID BM  . folic acid  1 mg Oral Daily  . ipratropium-albuterol  3 mL Nebulization Q6H  . multivitamin with minerals  1 tablet Oral Daily  . piperacillin-tazobactam (ZOSYN)  IV  3.375 g Intravenous Q8H  . QUEtiapine  25 mg Oral QHS  . thiamine  100 mg Oral Daily   Or  . thiamine   100 mg Intravenous Daily    Assessment/Plan:  1. Left base pneumonia and fever of 102 last night. Zosyn started. Will need at least 24 hours worth of IV antibiotics. Reassess tomorrow.  2. acute encephalopathy likely secondary to pneumonia. I think the alcohol delirium withdrawal is through at this point. Try not to give anymore ativan because he gets more lethargic.  Haldol prn, seroquel at night.  Patient need to be without sitter for 24 hours prior to discharge to rehab 3. Aortic aneurysm rupture status post endovascular stent by Dr. Wyn Quaker.  4. Acute kidney injury secondary to ATN and volume loss. Improved 5. Hypokalemia improved 6. Postoperative ileus. No nausea vomiting. Advance to solid food. 7. Essential hypertension. Continue to monitor 8. Weakness. Physical therapy recommended rehabilitation.   Code Status:  Code Status History    This patient does not have a recorded code status. Please follow your organizational policy for patients in this situation.     Family Communication: Left message for wife Disposition Plan: Physical therapy recommended rehabilitation. We'll get Administrator, [employer]. Needs to be without sitter for 24 hours prior to disposition  Consultants:  Vascular surgery  Procedures:  Endovascular stent  Time spent: 24 minutes  Alford Highland  [employer]

## 2016-02-15 NOTE — Progress Notes (Signed)
Physical Therapy Treatment Patient Details Name: Larry BickersWilliam H Plourde MRN: [id number] DOB: 01-17-1941 Today's Date: 02/15/2016    History of Present Illness Pt is a 75 y.o. M admitted to hospital for low back pain and nausea. Pt diagnosed with ruptured abdominal aorta aneurysm. Pt received repair surgery on 4/25. Pt intubatd at time of surgery and extubated on 4/26. Pt has hx of HTN, hyperlipidemia, gout and alcoholism.     PT Comments    Pt in bed ready for session.  More alert and conversational today.  To edge of bed with mod a x 1 and extensive verbal and tactile cues for hand placements and sequencing.  Sitting edge of bed without support and no loss of balance statically.  Stood with min guard x 2 with poor hand placements and difficult to redirect/educate.  Was able to stand with min guard +2 for standing exercise as described below.  Knee weakness noted with some buckling that he was able to control.  Upon standing a second time, he was able to transfer to recliner at bedside where he has short shuffling steps.  Generally steady but unsafe to transfer independently.  He participated in seated exercises then stood a third time to complete standing exercises.  Overall improvement in mobility skills but limited by weakness and difficulty following directions at times.  Gait deferred due to visible knee weakness with marching in place.   Follow Up Recommendations  SNF     Equipment Recommendations  Rolling walker with 5" wheels    Recommendations for Other Services       Precautions / Restrictions Precautions Precautions: Fall Restrictions Weight Bearing Restrictions: No    Mobility  Bed Mobility Overal bed mobility: Needs Assistance Bed Mobility: Supine to Sit     Supine to sit: Mod assist     General bed mobility comments: verbal and tactile cues for hand placements and sequencing  Transfers Overall transfer level: Needs assistance Equipment used: Rolling walker (2  wheeled) Transfers: Sit to/from Stand Sit to Stand: Min assist;+2 physical assistance;+2 safety/equipment         General transfer comment: difficulty following verbal cues  Ambulation/Gait Ambulation/Gait assistance: Min assist;+2 physical assistance Ambulation Distance (Feet): 3 Feet Assistive device: Rolling walker (2 wheeled) Gait Pattern/deviations: Shuffle (dec step height)   Gait velocity interpretation: <1.8 ft/sec, indicative of risk for recurrent falls     Stairs            Wheelchair Mobility    Modified Rankin (Stroke Patients Only)       Balance Overall balance assessment: Needs assistance Sitting-balance support: No upper extremity supported Sitting balance-Leahy Scale: Fair Sitting balance - Comments: no asssit needed for sitting balance today   Standing balance support: Bilateral upper extremity supported Standing balance-Leahy Scale: Fair                      Cognition Arousal/Alertness: Awake/alert Behavior During Therapy: WFL for tasks assessed/performed Overall Cognitive Status: Impaired/Different from baseline Area of Impairment: Attention;Following commands;Safety/judgement       Following Commands: Follows one step commands with increased time;Follows one step commands inconsistently Safety/Judgement: Decreased awareness of safety;Decreased awareness of deficits          Exercises General Exercises - Lower Extremity Long Arc Quad: AROM;Both;10 reps;Seated Hip Flexion/Marching: Both;20 reps;Standing (x 2) Heel Raises: AROM;Both;10 reps;Standing    General Comments        Pertinent Vitals/Pain Pain Assessment: No/denies pain    Home Living  Prior Function            PT Goals (current goals can now be found in the care plan section) Acute Rehab PT Goals Patient Stated Goal: to return to PLOF Progress towards PT goals: Progressing toward goals    Frequency  Min 2X/week    PT  Plan Current plan remains appropriate    Co-evaluation             End of Session Equipment Utilized During Treatment: Gait belt Activity Tolerance: Patient tolerated treatment well Patient left: in chair;with call bell/phone within reach;with nursing/sitter in room;with family/visitor present     Time: 1130-1149 PT Time Calculation (min) (ACUTE ONLY): 19 min  Charges:  $Therapeutic Exercise: 8-22 mins $Therapeutic Activity: 8-22 mins                    G Codes:      Danielle Dess, PTA 02/15/2016, 12:04 PM

## 2016-02-15 NOTE — Evaluation (Signed)
Clinical/Bedside Swallow Evaluation Patient Details  Name: Larry Buckley MRN: [id number] Date of Birth: 1941/02/06  Today's Date: 02/15/2016 Time:        Past Medical History:  Past Medical History  Diagnosis Date  . Hyperlipidemia   . Hypertension   . Gout, arthropathy    Past Surgical History:  Past Surgical History  Procedure Laterality Date  . Peripheral vascular catheterization N/A 02/08/2016    Procedure: Endovascular Repair/Stent Graft;  Surgeon: Annice NeedyJason S Dew, MD;  Location: [facility] INVASIVE CV LAB;  Service: Cardiovascular;  Laterality: N/A;   HPI:    Pt is a 75 y.o. M admitted to hospital for low back pain and nausea. Pt diagnosed with ruptured abdominal aorta aneurysm. Pt received repair surgery on 4/25. Pt intubatd at time of surgery and extubated on 4/26. Pt has hx of HTN, hyperlipidemia, gout and alcoholism. Pt is currently on CIWA precautions; has required Ativan at night at times sec. to agitation. Pt currently has a Sitter sec. to behavior. He has been on a regular diet per MD.  Assessment / Plan / Recommendation Clinical Impression   Pt appeared to adequate tolerate trials of thin lqiuids and puree/soft solids w/ no overt s/s of aspiration noted; no decline in respiratory status or wet vocal quality noted during/post po trials. However, noted min. increased mastication time/effort; suspect pt's drowsiness and declined Cognition at this time may be impacting his attention and stamina to task of eating/mastication. Using small, single bites and verbal cues w/ aspiration precautions in general, pt appeared to adequately tolerate trials of soft solid foods w/ appropriate oral clearing; NSG monitored oral clearing as well. Due to pt's current presentation including drowsiness and Cognitive decline, recommend a Dys. 3 w/ thin liquids w/ aspiration precautions; meds in puree; monitoring at all meals in order to reduce risk for aspiration.     Aspiration Risk   reduced following  general aspiration precautions   Diet Recommendation  Dys. 3(mech soft), thin liquids; general aspiration precautions; Meds in Puree - whole as able; feeding assistance at meals sec. to declined Cognitive status/drowsiness at this time       Other  Recommendations  pinch straw to limit large bolus amounts and multiple sips  Follow up Recommendations   ST services for toleration of diet; education on aspiration precautions for pt/family   Frequency and Duration  2x week; x1 week         Prognosis  Good following general precautions; monitoring pt during oral intake.     Swallow Study   General      Oral/Motor/Sensory Function  wfl - min. Limited sec. To pt's drowsiness and declined Cognitive status for follow through  Ice Chips     Thin Liquid  wfl   Nectar Thick  NT  Honey Thick  NT  Puree  wfl  Solid   GO      min. Impaired c/b min. Increased mastication time/effort      Larry SomKatherine Chesley Valls, MS, CCC-SLP  Larry Buckley 02/15/2016,3:11 PM

## 2016-02-15 NOTE — Progress Notes (Signed)
[facility]  Daily Progress Note   Subjective  - 7 Days Post-Op  Seems a little more clear today.  No events overnight  Objective Filed Vitals:   02/15/16 0229 02/15/16 0436 02/15/16 0712 02/15/16 0750  BP:  138/80 148/68   Pulse:  87 88   Temp:  99.2 F (37.3 C) 98.6 F (37 C)   TempSrc:      Resp:  24 18   Height:      Weight:      SpO2: 93% 99% 98% 94%    Intake/Output Summary (Last 24 hours) at 02/15/16 0840 Last data filed at 02/15/16 0725  Gross per 24 hour  Intake    580 ml  Output      0 ml  Net    580 ml    PULM  CTAB CV  RRR VASC  Feet warm, incisions C/D/I  Laboratory CBC    Component Value Date/Time   WBC 10.4 02/13/2016 0930   HGB 10.1* 02/13/2016 0930   HCT 30.8* 02/13/2016 0930   PLT 188 02/13/2016 0930    BMET    Component Value Date/Time   NA 139 02/11/2016 0439   K 4.2 02/11/2016 0439   CL 108 02/11/2016 0439   CO2 23 02/11/2016 0439   GLUCOSE 105* 02/11/2016 0439   BUN 21* 02/11/2016 0439   CREATININE 0.87 02/11/2016 0439   CALCIUM 8.3* 02/11/2016 0439   GFRNONAA >60 02/11/2016 0439   GFRAA >60 02/11/2016 0439    Assessment/Planning: POD #7 s/p ruptured AAA repair   Would like to remove sitter today if possible  Medically stable for discharge once MS clears    Jodell Weitman  02/15/2016, 8:40 AM

## 2016-02-15 NOTE — Progress Notes (Signed)
Per Dr. Wieting okay to give pt a dose of Tylenol for fever of 100.9 even though it has not quite met the parameter for PRN Tylenol 

## 2016-02-15 NOTE — Progress Notes (Signed)
Throughout the night pt rested and was not combative. RN did not give pt any ativan or any medication for agitation. Agitation was decreased with therapeutic communication. RN will relay to message to oncoming RN.   Karsten RoLauren E Hobbs

## 2016-02-15 NOTE — Progress Notes (Signed)
Pt became agitated, wanted to get out of bed and leave the room. Tried to redirect patient but he was grabbing staff and could not be convinced to get back in the bed. Had to give prn haldol and eventually was convinced to lay back in the bed.

## 2016-02-15 NOTE — Progress Notes (Signed)
Pharmacy Antibiotic Note  Larry Buckley is a 75 y.o. male admitted on 02/08/2016 with pneumonia.  Pharmacy has been consulted for Zosyn dosing.  Plan: Will order Zosyn 3.375 gm IV q8h per EI protocol based on renal function.  Height: 6\' 2"  (188 cm) Weight: 202 lb 2.6 oz (91.7 kg) IBW/kg (Calculated) : 82.2  Temp (24hrs), Avg:99.6 F (37.6 C), Min:97.4 F (36.3 C), Max:102.9 F (39.4 C)   Recent Labs Lab 02/09/16 0020 02/09/16 0549 02/09/16 1430 02/10/16 0526 02/11/16 0439 02/13/16 0930  WBC 14.2* 10.2 9.8 9.4 11.0* 10.4  CREATININE 0.96 1.16 1.47* 1.12 0.87  --     Estimated Creatinine Clearance: 85.3 mL/min (by C-G formula based on Cr of 0.87).    Allergies  Allergen Reactions  . Lotensin [Benazepril Hcl] Swelling    Antimicrobials this admission: Anti-infectives    Start     Dose/Rate Route Frequency Ordered Stop   02/15/16 0800  piperacillin-tazobactam (ZOSYN) IVPB 3.375 g     3.375 g 12.5 mL/hr over 240 Minutes Intravenous Every 8 hours 02/15/16 0740     02/09/16 0000  cefUROXime (ZINACEF) 1.5 g in dextrose 5 % 50 mL IVPB     1.5 g 100 mL/hr over 30 Minutes Intravenous Every 12 hours 02/08/16 2343 02/09/16 1330       Microbiology results: Results for orders placed or performed during the hospital encounter of 02/08/16  MRSA PCR Screening     Status: None   Collection Time: 02/09/16  1:14 AM  Result Value Ref Range Status   MRSA by PCR NEGATIVE NEGATIVE Final    Comment:        The GeneXpert MRSA Assay (FDA approved for NASAL specimens only), is one component of a comprehensive MRSA colonization surveillance program. It is not intended to diagnose MRSA infection nor to guide or monitor treatment for MRSA infections.      Thank you for allowing pharmacy to be a part of this patient's care.  [name] 02/15/2016 7:43 AM

## 2016-02-16 LAB — CBC
HEMATOCRIT: 26.6 % — AB (ref 40.0–52.0)
HEMOGLOBIN: 9.2 g/dL — AB (ref 13.0–18.0)
MCH: 31.9 pg (ref 26.0–34.0)
MCHC: 34.7 g/dL (ref 32.0–36.0)
MCV: 91.9 fL (ref 80.0–100.0)
PLATELETS: 281 10*3/uL (ref 150–440)
RBC: 2.89 MIL/uL — AB (ref 4.40–5.90)
RDW: 16.4 % — ABNORMAL HIGH (ref 11.5–14.5)
WBC: 9.2 10*3/uL (ref 3.8–10.6)

## 2016-02-16 LAB — BASIC METABOLIC PANEL
ANION GAP: 10 (ref 5–15)
BUN: 14 mg/dL (ref 6–20)
CHLORIDE: 106 mmol/L (ref 101–111)
CO2: 24 mmol/L (ref 22–32)
Calcium: 8.3 mg/dL — ABNORMAL LOW (ref 8.9–10.3)
Creatinine, Ser: 0.73 mg/dL (ref 0.61–1.24)
GFR calc Af Amer: >60 mL/min (ref >60)
GLUCOSE: 129 mg/dL — AB (ref 65–99)
POTASSIUM: 3.2 mmol/L — AB (ref 3.5–5.1)
Sodium: 140 mmol/L (ref 135–145)

## 2016-02-16 MED ORDER — POTASSIUM CHLORIDE CRYS ER 20 MEQ PO TBCR
40.0000 meq | EXTENDED_RELEASE_TABLET | Freq: Once | ORAL | Status: AC
  Administered 2016-02-16: 40 meq via ORAL
  Filled 2016-02-16: qty 2

## 2016-02-16 NOTE — Care Management Important Message (Signed)
Important Message  Patient Details  Name: Larry BickersWilliam H Conklin MRN: [id number] Date of Birth: Feb 12, 1941   Medicare Important Message Given:  Yes    Olegario MessierKathy A Guhan Bruington 02/16/2016, 2:43 PM

## 2016-02-16 NOTE — Progress Notes (Signed)
Speech Language Pathology Treatment: Dysphagia  Patient Details Name: Larry Buckley MRN: [id number] DOB: [birth year] Today's Date: 02/16/2016 Time: 5945-8592 SLP Time Calculation (min) (ACUTE ONLY): 40 min  Assessment / Plan / Recommendation Clinical Impression  Pt appears to be tolerating his current diet consistency of Dys. 3 (mech soft) w/ thin liquids w/ no reported overt s/s of aspiration by staff, or by pt. Pt consumed po trials w/ no oral phase deficits noted and helped to feed self w/ setup assistance. Pt's alertness is improving and pt is more engaged in conversation w/ others; behavior is improved and the Kennon Holter has been discontinued per MD today. Pt appears to be at his baseline w/ toleration of an oral diet per presentation and pt's report. Rec. Continue w/ soft solids and meds in puree as needed; general aspiration precautions as reviewed w/ pt. No further skilled ST services indicated at this time. NSG to reconsult if any change in status.    HPI HPI: Pt is a 75 y.o. M admitted to hospital for low back pain and nausea. Pt diagnosed with ruptured abdominal aorta aneurysm. Pt received repair surgery on 4/25. Pt intubatd at time of surgery and extubated on 4/26. Pt has hx of HTN, hyperlipidemia, gout and alcoholism. Pt is currently on CIWA precautions; has required Ativan at night at times sec. to agitation. Pt currently has a Actuary which is being d/c'd today per MD d/t improved status. Pt is tolerating his current Dys. 3 diet per report. He is more conversive and engaged in interaction w/ SLP.       SLP Plan  All goals met     Recommendations  Diet recommendations: Dysphagia 3 (mechanical soft);Thin liquid Liquids provided via: Cup Medication Administration: Whole meds with puree (as needed) Supervision: Patient able to self feed Compensations: Minimize environmental distractions;Slow rate;Small sips/bites;Follow solids with liquid Postural Changes and/or Swallow Maneuvers: Seated  upright 90 degrees;Upright 30-60 min after meal             Oral Care Recommendations: Oral care BID;Staff/trained caregiver to provide oral care Follow up Recommendations: None Plan: All goals met     GO               Orinda Kenner, [place], CCC-SLP  Larry Buckley 02/16/2016, 3:36 PM

## 2016-02-16 NOTE — Progress Notes (Signed)
Physical Therapy Treatment Patient Details Name: Larry BickersWilliam H Buckley MRN: [id number] DOB: [birth year] Today's Date: 02/16/2016    History of Present Illness Pt is a 75 y.o. M admitted to hospital for low back pain and nausea. Pt diagnosed with ruptured abdominal aorta aneurysm. Pt received repair surgery on 4/25. Pt intubatd at time of surgery and extubated on 4/26. Pt has hx of HTN, hyperlipidemia, gout and alcoholism.     PT Comments    Patient appears to have made a significant improvement in mobility status today, he ambulates multiple laps with and without AD around the nurse's station. 2 near falls without AD and low modified DGI score indicative of high falls risk without RW currently. Of note he seemed confused or just generally not well oriented stating he was getting a job, he was "the sharpest walker" among other things. Physically he appears safe to return to previous environment with RW. Would recommend having 24 hour supervision given his cognitive status and poor insight into his deficits.   Follow Up Recommendations  Supervision/Assistance - 24 hour;Home health PT     Equipment Recommendations  Rolling walker with 5" wheels    Recommendations for Other Services       Precautions / Restrictions Precautions Precautions: Fall Restrictions Weight Bearing Restrictions: No    Mobility  Bed Mobility Overal bed mobility: Independent             General bed mobility comments: No deficits with bed mobility.   Transfers Overall transfer level: Needs assistance Equipment used: Rolling walker (2 wheeled) Transfers: Sit to/from Stand Sit to Stand: Supervision         General transfer comment: Patient must use LUE on bed to complete transfer, no balance deficits after use of UEs. Leaning posteriorly without use of UEs.   Ambulation/Gait Ambulation/Gait assistance: Min guard Ambulation Distance (Feet): 500 Feet Assistive device: Rolling walker (2 wheeled);None Gait  Pattern/deviations: Wide base of support   Gait velocity interpretation: <1.8 ft/sec, indicative of risk for recurrent falls General Gait Details: Patient ambulates with RW with no deficits identified. PT attempted gait without AD, patient demonstrates 2 episodes of loss of balance requiring PT to catch him to prevent fall. Modified DGI of 9/12 without AD.    Stairs            Wheelchair Mobility    Modified Rankin (Stroke Patients Only)       Balance Overall balance assessment: Needs assistance Sitting-balance support: Bilateral upper extremity supported Sitting balance-Leahy Scale: Normal     Standing balance support: Bilateral upper extremity supported Standing balance-Leahy Scale: Good                      Cognition Arousal/Alertness: Awake/alert Behavior During Therapy: WFL for tasks assessed/performed Overall Cognitive Status: Impaired/Different from baseline Area of Impairment: Attention;Following commands;Safety/judgement;Memory     Memory: Decreased short-term memory Following Commands: Follows one step commands consistently Safety/Judgement: Decreased awareness of safety;Decreased awareness of deficits          Exercises General Exercises - Lower Extremity Long Arc Quad: AROM;Both;10 reps;Seated Heel Slides: AROM;Both;10 reps Straight Leg Raises: AROM;10 reps;Both    General Comments General comments (skin integrity, edema, etc.): With RW Modified DGI of 11/12, without RW 9/12 and 2 episodes of loss of balance.       Pertinent Vitals/Pain Pain Assessment: Faces Faces Pain Scale: Hurts a little bit Pain Location: His R foot? Appears to be chronic.  Pain Descriptors / Indicators: Aching  Pain Intervention(s): Limited activity within patient's tolerance;Monitored during session    Home Living                      Prior Function            PT Goals (current goals can now be found in the care plan section) Acute Rehab PT  Goals Patient Stated Goal: to return to PLOF PT Goal Formulation: With patient Time For Goal Achievement: 02/25/16 Progress towards PT goals: Progressing toward goals    Frequency  Min 2X/week    PT Plan Discharge plan needs to be updated    Co-evaluation             End of Session Equipment Utilized During Treatment: Gait belt Activity Tolerance: Patient tolerated treatment well Patient left: in chair;with call bell/phone within reach;with nursing/sitter in room;with family/visitor present     Time: 4782-9562 PT Time Calculation (min) (ACUTE ONLY): 25 min  Charges:  $Gait Training: 23-37 mins                    G Codes:      [name], PT, DPT   02/16/2016, 5:42 PM

## 2016-02-16 NOTE — Progress Notes (Signed)
Patient ID: Larry Buckley, male   DOB: 1941-01-12, 75 y.o.   MRN: [id number]  [facility] PROGRESS NOTE  Larry Buckley:[id number] DOB: 01-18-1941 DOA: 02/08/2016 PCP: No primary care provider on file.  HPI/Subjective: Patient talking to me better today than previous days. Feels okay. Wants to go home. Offers no physical complaints.  Objective: Filed Vitals:   02/16/16 0503 02/16/16 1259  BP: 169/80 170/83  Pulse: 91 90  Temp: 99.7 F (37.6 C) 99.8 F (37.7 C)  Resp: 22 20    Filed Weights   02/08/16 1547 02/08/16 2349  Weight: 86.183 kg (190 lb) 91.7 kg (202 lb 2.6 oz)    ROS: Review of Systems  Constitutional: Negative for weight loss.  Respiratory: Positive for cough. Negative for shortness of breath.   Cardiovascular: Negative for chest pain.  Gastrointestinal: Negative for nausea, vomiting, abdominal pain, diarrhea and constipation.  Genitourinary: Negative for urgency.  Musculoskeletal: Negative for joint pain.  Neurological: Negative for focal weakness and headaches.  Endo/Heme/Allergies: Does not bruise/bleed easily.   Exam: Physical Exam  HENT:  Nose: No mucosal edema.  Mouth/Throat: No oropharyngeal exudate or posterior oropharyngeal edema.  Eyes: Conjunctivae and lids are normal. Pupils are equal, round, and reactive to light.  Neck: No JVD present. Carotid bruit is not present. No edema present. No thyroid mass and no thyromegaly present.  Cardiovascular: S1 normal and S2 normal.  Exam reveals no gallop.   No murmur heard. Pulses:      Dorsalis pedis pulses are 2+ on the right side, and 2+ on the left side.  Respiratory: No respiratory distress. He has no wheezes. He has no rhonchi. He has no rales.  GI: Soft. Bowel sounds are normal. There is no tenderness.  Musculoskeletal:       Right ankle: He exhibits swelling.       Left ankle: He exhibits swelling.  Lymphadenopathy:    He has no cervical adenopathy.  Neurological: He is alert.  Follow  simple commands  Skin: Skin is warm. No rash noted. Nails show no clubbing.  Psychiatric: He has a normal mood and affect.      Data Reviewed: Basic Metabolic Panel:  Recent Labs Lab 02/09/16 1430 02/10/16 0526 02/11/16 0439 02/16/16 0359  NA 139 142 139 140  K 5.3* 4.8 4.2 3.2*  CL 109 110 108 106  CO2 18* GLUCOSE 121* 118* 105* 129*  BUN 19 19 21* 14  CREATININE 1.47* 1.12 0.87 0.73  CALCIUM 7.5* 8.0* 8.3* 8.3*   CBC:  Recent Labs Lab 02/09/16 1430 02/10/16 0526 02/11/16 0439 02/13/16 0930 02/16/16 0359  WBC 9.8 9.4 11.0* 10.4 9.2  HGB 11.5* 10.3* 8.8* 10.1* 9.2*  HCT 33.6* 30.4* 26.2* 30.8* 26.6*  MCV 91.8 92.6 91.5 94.1 91.9  PLT 116* 131* 159 188 281    Recent Results (from the past 240 hour(s))  MRSA PCR Screening     Status: None   Collection Time: 02/09/16  1:14 AM  Result Value Ref Range Status   MRSA by PCR NEGATIVE NEGATIVE Final    Comment:        The GeneXpert MRSA Assay (FDA approved for NASAL specimens only), is one component of a comprehensive MRSA colonization surveillance program. It is not intended to diagnose MRSA infection nor to guide or monitor treatment for MRSA infections.       Scheduled Meds: . aspirin EC  81 mg Oral Daily  . docusate sodium  100  mg Oral Daily  . feeding supplement (ENSURE ENLIVE)  237 mL Oral BID BM  . folic acid  1 mg Oral Daily  . multivitamin with minerals  1 tablet Oral Daily  . piperacillin-tazobactam (ZOSYN)  IV  3.375 g Intravenous Q8H  . QUEtiapine  25 mg Oral QHS  . thiamine  100 mg Oral Daily   Or  . thiamine  100 mg Intravenous Daily    Assessment/Plan:  1. Left base pneumonia and fever. Continue IV Zosyn while here in the hospital and switch over to Augmentin upon discharge  2. acute encephalopathy likely secondary to pneumonia. I think the alcohol delirium withdrawal is through at this point. Try not to give anymore ativan because he gets more lethargic.  Haldol prn,  seroquel at night. 3. Aortic aneurysm rupture status post endovascular stent by Dr. Wyn Quakerew.  4. Acute kidney injury secondary to ATN and volume loss. Improved 5. Hypokalemia improved 6. Postoperative ileus. Resolved 7. Essential hypertension. Continue to monitor 8. Weakness. Physical therapy recommended rehabilitation.   Family Communication: Spoke with wife at the bedside Disposition Plan: Likely to rehabilitation tomorrow  Consultants:  Vascular surgery  Procedures:  Endovascular stent  Time spent: 25 minutes  Larry Buckley, Larry Buckley  [employer]

## 2016-02-16 NOTE — Plan of Care (Signed)
Problem: Education: Goal: Knowledge of [facility] Education information/materials will improve Outcome: Not Progressing Patient is confused therefore family assistance is needed.  Problem: Education: Goal: Knowledge of [facility] Education information/materials will improve Outcome: Not Progressing Patient confused.  Family assistance is needed.  Problem: Health Behavior/Discharge Planning: Goal: Ability to manage health-related needs will improve Outcome: Not Progressing Patient confused.  Problem: Activity: Goal: Risk for activity intolerance will decrease Outcome: Not Progressing Patient is a high fall risk and on ciwa protocol.

## 2016-02-16 NOTE — Progress Notes (Signed)
[facility]  Daily Progress Note   Subjective  - 8 Days Post-Op  No sitter today. MS better.  No overnight events  Objective Filed Vitals:   02/15/16 1347 02/15/16 1757 02/15/16 2030 02/16/16 0503  BP:   153/52 169/80  Pulse:   95 91  Temp: 99 F (37.2 C) 100.9 F (38.3 C) 99.6 F (37.6 C) 99.7 F (37.6 C)  TempSrc: Oral Oral Oral Oral  Resp:   20 22  Height:      Weight:      SpO2:   100% 99%    Intake/Output Summary (Last 24 hours) at 02/16/16 1159 Last data filed at 02/16/16 0900  Gross per 24 hour  Intake    306 ml  Output    550 ml  Net   -244 ml    PULM  CTAB CV  RRR VASC  Feet warm, good pulses  Laboratory CBC    Component Value Date/Time   WBC 9.2 02/16/2016 0359   HGB 9.2* 02/16/2016 0359   HCT 26.6* 02/16/2016 0359   PLT 281 02/16/2016 0359    BMET    Component Value Date/Time   NA 140 02/16/2016 0359   K 3.2* 02/16/2016 0359   CL 106 02/16/2016 0359   CO2 24 02/16/2016 0359   GLUCOSE 129* 02/16/2016 0359   BUN 14 02/16/2016 0359   CREATININE 0.73 02/16/2016 0359   CALCIUM 8.3* 02/16/2016 0359   GFRNONAA >60 02/16/2016 0359   GFRAA >60 02/16/2016 0359    Assessment/Planning: POD #8 s/p ruptured AAA repair   MS better.  No sitter today.  If stays OK and not overly agitated, plan discharge to rehab tomorrow  Pneumonia. Better with ABx    DEW,JASON  02/16/2016, 11:59 AM

## 2016-02-17 MED ORDER — DOCUSATE SODIUM 100 MG PO CAPS: 100.0000 mg | ORAL_CAPSULE | Freq: Every day | ORAL | Status: DC

## 2016-02-17 MED ORDER — ASPIRIN EC 81 MG PO TBEC: 81.0000 mg | DELAYED_RELEASE_TABLET | Freq: Every day | ORAL | Status: DC

## 2016-02-17 MED ORDER — ENSURE ENLIVE PO LIQD: 237.0000 mL | Freq: Two times a day (BID) | ORAL | Status: DC

## 2016-02-17 MED ORDER — QUETIAPINE FUMARATE 25 MG PO TABS: 25.0000 mg | ORAL_TABLET | Freq: Every day | ORAL | Status: DC

## 2016-02-17 MED ORDER — AMOXICILLIN-POT CLAVULANATE 875-125 MG PO TABS
1.0000 | ORAL_TABLET | Freq: Two times a day (BID) | ORAL | Status: DC
  Administered 2016-02-17: 1 via ORAL
  Filled 2016-02-17: qty 1

## 2016-02-17 MED ORDER — AMOXICILLIN-POT CLAVULANATE 875-125 MG PO TABS: 1.0000 | ORAL_TABLET | Freq: Two times a day (BID) | ORAL | Status: DC

## 2016-02-17 MED ORDER — ADULT MULTIVITAMIN W/MINERALS CH: 1.0000 | ORAL_TABLET | Freq: Every day | ORAL | Status: DC

## 2016-02-17 NOTE — Care Management Note (Signed)
Case Management Note  Patient Details  Name: Larry BickersWilliam H Skufca MRN: [id number] Date of Birth: 20-Dec-1940  Subjective/Objective:   75 y.o. M admitted 02/08/16 with AAA Rupture and subsequent repair on 02/09/2016. Post op confusion requiring sitter and inability to ambulate safely initially with PT recommending SNF. Today PT recommendations have changed and pt is felt to be OK to go home with HHPT and RW. CM spoke with pt and wife by phone , with the permission of the pt, Magda BernheimCarrie Radigan @ [phone] who reports she will be visiting shortly and is prepared to take the pt home. Reports she has assistance of adult children in the home if needed for recommended 24/7 care. Will provide with list of HH agencies in [place] for her to chose HHPT.                 Action/Plan: CM will continue to follow.    Expected Discharge Date:                  Expected Discharge Plan:  Home w Home Health Services  In-House Referral:     Discharge planning Services  CM Consult  Post Acute Care Choice:    Choice offered to:  Patient, Spouse [name](Carrie Lizer [phone])  DME Arranged:    DME Agency:     HH Arranged:    HH Agency:     Status of Service:  In process, will continue to follow  Medicare Important Message Given:  Yes Date Medicare IM Given:    Medicare IM give by:    Date Additional Medicare IM Given:    Additional Medicare Important Message give by:     If discussed at Long Length of Stay Meetings, dates discussed:    Additional Comments:  Yvone NeuCrutchfield, Kaydence Baba M, RN 02/17/2016, 9:50 AM

## 2016-02-17 NOTE — Clinical Social Work Note (Signed)
Patient had to have a sitter last evening again due to behavior. Patient witnessed by CSW ambulating around nursing station independently this morning with his sitter following behind. Patient no longer needs skilled rehab placement. RN CM has arranged home health. York SpanielMonica Reann Dobias MSW,LCSW [phone]

## 2016-02-17 NOTE — Plan of Care (Signed)
Problem: Education: Goal: Knowledge of [facility] Education information/materials will improve Outcome: Not Progressing Patient confused.  Family assistance needed.  Problem: Education: Goal: Knowledge of [facility] Education information/materials will improve Outcome: Not Progressing Patient confused.  Family assistance needed.  Problem: Health Behavior/Discharge Planning: Goal: Ability to manage health-related needs will improve Outcome: Not Progressing Patient confused.  Family assistance needed.

## 2016-02-17 NOTE — Progress Notes (Signed)
Patient became increasingly agitated and restless as the night progressed so a CNA was taken off the floor to sit with patient.  Nursing supervisor will provide a safety sitter for the morning shift.  Arturo MortonClay, Esme Durkin N  02/17/2016  4:31 AM

## 2016-02-17 NOTE — Progress Notes (Signed)
[facility]  Daily Progress Note  Subjective:  9 Days Post-Op: US guidance for vascular access, bilateral femoral arteries, catheter placement into aorta from bilateral femoral approaches, placement of a 28 mm diameter proximal 18 cm length Gore Excluder Endoprosthesis main body right with a 14 mm diameter by 10 cm length left contralateral limb, placement of an 18 mm diameter by 10 cm length left iliac limb extender, placement of a 14 mm diameter by 14 cm length right iliac limb extender, placement of 3 proximal aortic cuff extenders to gain proximal seal using two 28 mm diameter cuffs and one 26 mm diameter cuff with ProGlide closure devices bilateral femoral arteries.  Sitter at bedside this AM. Patient states he had a "horrible night" however couldn't really elaborate as to why. States he is having bowel movements and passing gas. Eating without nausea.  Objective: Filed Vitals:   02/15/16 2030 02/16/16 0503 02/16/16 1259 02/17/16 0523  BP: 153/52 169/80 170/83 172/87  Pulse: 95 91 90 97  Temp: 99.6 F (37.6 C) 99.7 F (37.6 C) 99.8 F (37.7 C) 99.2 F (37.3 C)  TempSrc: Oral Oral Oral Oral  Resp: 20 22 20 20   Height:      Weight:      SpO2: 100% 99% 97% 99%    Intake/Output Summary (Last 24 hours) at 02/17/16 0944 Last data filed at 02/17/16 0734  Gross per 24 hour  Intake    477 ml  Output    150 ml  Net    327 ml    Physical Exam: A&Ox3, NAD CV: RRR Pulmonary: CTA Bilaterally Abdomen: Soft, Nontender, Mild distention Vascular: warm, non-tender, mild edema Laboratory: CBC    Component Value Date/Time   WBC 9.2 02/16/2016 0359   HGB 9.2* 02/16/2016 0359   HCT 26.6* 02/16/2016 0359   PLT 281 02/16/2016 0359   BMET    Component Value Date/Time   NA 140 02/16/2016 0359   K 3.2* 02/16/2016 0359   CL 106 02/16/2016 0359   CO2 24 02/16/2016 0359   GLUCOSE 129* 02/16/2016 0359   BUN 14 02/16/2016 0359   CREATININE 0.73 02/16/2016 0359   CALCIUM 8.3* 02/16/2016 0359   GFRNONAA >60 02/16/2016 0359   GFRAA >60 02/16/2016 0359    Assessment/Planning: 75 year old male s/p emergent AAA repair - doing well 1) Awaiting dispo to rehab  Longs Drug StoresKimberly Stegmayer PA-C 02/17/2016 9:44 AM

## 2016-02-17 NOTE — Discharge Summary (Signed)
[facility] - [place] [facility]   PATIENT NAME: Larry Buckley    MR#:  [id number]  DATE OF BIRTH:  [birth year]  DATE OF ADMISSION:  02/08/2016 ADMITTING PHYSICIAN: Annice NeedyJason S Dew, MD  DATE OF DISCHARGE: 02/17/2016 12:08 PM  PRIMARY CARE PHYSICIAN: Dr. Vonita MossMark Crissman   ADMISSION DIAGNOSIS:  Bilateral low back pain without sciatica [M54.5] Ruptured abdominal aortic aneurysm (AAA) (HCC) [I71.3]  DISCHARGE DIAGNOSIS:  Active Problems:   Ruptured abdominal aortic aneurysm (AAA) (HCC)   SECONDARY DIAGNOSIS:   Past Medical History  Diagnosis Date  . Hyperlipidemia   . Hypertension   . Gout, arthropathy     HOSPITAL COURSE:   1. Ruptured aortic aneurysm with bleeding. Emergent surgery by Dr. Wyn Quakerew vascular surgery was done. An endovascular stent was placed. Patient did well with regards to this procedure afterwards. 2. Alcohol withdrawal. Patient must not drink any further beer patient was in the ICU for a few days on Precedex drip. He had periods of confusion during the hospital course. He did not do well with Ativan. 3. In-hospital pneumonia likely aspiration. Patient was started on IV Zosyn and switch over to by mouth Augmentin upon discharge home. 4. Acute encephalopathy in the hospital secondary to pneumonia, dementia. Initially it was alcohol withdrawal. Patient given Seroquel at night 5. Acute kidney injury secondary to ATN in finding loss which has improved 6. Hypokalemia improved 7. Postoperative ileus resolved 8. History of hypertension essential 9. Weakness. Physical therapy initially recommended rehabilitation but changed her mind to home with home health once patient was more alert to walk. Home health PT, RN, aide and social work   DISCHARGE CONDITIONS:   Fair  CONSULTS OBTAINED:  Treatment Team:  Alford Highlandichard Connie Lasater, MD  DRUG ALLERGIES:   Allergies  Allergen Reactions  . Lotensin [Benazepril Hcl] Swelling    DISCHARGE MEDICATIONS:    Discharge Medication List as of 02/17/2016 11:46 AM    START taking these medications   Details  amoxicillin-clavulanate (AUGMENTIN) 875-125 MG tablet Take 1 tablet by mouth every 12 (twelve) hours., Starting 02/17/2016, Until Discontinued, Print    feeding supplement, ENSURE ENLIVE, (ENSURE ENLIVE) LIQD Take 237 mLs by mouth 2 (two) times daily between meals., Starting 02/17/2016, Until Discontinued, Print    QUEtiapine (SEROQUEL) 25 MG tablet Take 1 tablet (25 mg total) by mouth at bedtime., Starting 02/17/2016, Until Discontinued, Print      CONTINUE these medications which have CHANGED   Details  aspirin EC 81 MG tablet Take 1 tablet (81 mg total) by mouth daily., Starting 02/17/2016, Until Discontinued, Print    docusate sodium (COLACE) 100 MG capsule Take 1 capsule (100 mg total) by mouth daily., Starting 02/17/2016, Until Discontinued, Print    Multiple Vitamin (MULTIVITAMIN WITH MINERALS) TABS tablet Take 1 tablet by mouth daily., Starting 02/17/2016, Until Discontinued, Print      STOP taking these medications     oxyCODONE-acetaminophen (PERCOCET/ROXICET) 5-325 MG tablet          DISCHARGE INSTRUCTIONS:   No driving No beer Follow-up PMD one week Follow-up vascular surgery as outpatient  If you experience worsening of your admission symptoms, develop shortness of breath, life threatening emergency, suicidal or homicidal thoughts you must seek medical attention immediately by calling 911 or calling your MD immediately  if symptoms less severe.  You Must read complete instructions/literature along with all the possible adverse reactions/side effects for all the Medicines you take and that have been prescribed to you. Take any new Medicines  after you have completely understood and accept all the possible adverse reactions/side effects.   Please note  You were cared for by a hospitalist during your hospital stay. If you have any questions about your discharge medications or the  care you received while you were in the hospital after you are discharged, you can call the unit and asked to speak with the hospitalist on call if the hospitalist that took care of you is not available. Once you are discharged, your primary care physician will handle any further medical issues. Please note that NO REFILLS for any discharge medications will be authorized once you are discharged, as it is imperative that you return to your primary care physician (or establish a relationship with a primary care physician if you do not have one) for your aftercare needs so that they can reassess your need for medications and monitor your lab values.    Today   CHIEF COMPLAINT:   Chief Complaint  Patient presents with  . Back Pain    HISTORY OF PRESENT ILLNESS:  Larry Buckley  is a 75 y.o. male found to have a aortic aneurysm which was bleeding and ruptured   VITAL SIGNS:  Blood pressure 171/77, pulse 85, temperature 97.8 F (36.6 C), temperature source Oral, resp. rate 20, height 6\' 2"  (1.88 m), weight 91.7 kg (202 lb 2.6 oz), SpO2 95 %.    PHYSICAL EXAMINATION:  GENERAL:  75 y.o.-year-old patient lying in the bed with no acute distress.  EYES: Pupils equal, round, reactive to light and accommodation. No scleral icterus. Extraocular muscles intact.  HEENT: Head atraumatic, normocephalic. Oropharynx and nasopharynx clear.  NECK:  Supple, no jugular venous distention. No thyroid enlargement, no tenderness.  LUNGS: Normal breath sounds bilaterally, no wheezing, rales,rhonchi or crepitation. No use of accessory muscles of respiration.  CARDIOVASCULAR: S1, S2 normal. No murmurs, rubs, or gallops.  ABDOMEN: Soft, non-tender, non-distended. Bowel sounds present. No organomegaly or mass.  EXTREMITIES: Trace edema, no cyanosis, or clubbing.  NEUROLOGIC: Cranial nerves II through XII are intact. Muscle strength 5/5 in all extremities. Sensation intact. Gait not checked.  PSYCHIATRIC: The patient  is alert.  SKIN: No obvious rash, lesion, or ulcer.   DATA REVIEW:   CBC  Recent Labs Lab 02/16/16 0359  WBC 9.2  HGB 9.2*  HCT 26.6*  PLT 281    Chemistries   Recent Labs Lab 02/16/16 0359  NA 140  K 3.2*  CL 106  CO2 24  GLUCOSE 129*  BUN 14  CREATININE 0.73  CALCIUM 8.3*     Microbiology Results  Results for orders placed or performed during the hospital encounter of 02/08/16  MRSA PCR Screening     Status: None   Collection Time: 02/09/16  1:14 AM  Result Value Ref Range Status   MRSA by PCR NEGATIVE NEGATIVE Final    Comment:        The GeneXpert MRSA Assay (FDA approved for NASAL specimens only), is one component of a comprehensive MRSA colonization surveillance program. It is not intended to diagnose MRSA infection nor to guide or monitor treatment for MRSA infections.      Management plans discussed with the patient, family and they are in agreement.  CODE STATUS:  Code Status History    This patient does not have a recorded code status. Please follow your organizational policy for patients in this situation.      TOTAL TIME TAKING CARE OF THIS PATIENT: 35 minutes.    Renae Gloss,  Shabree Tebbetts M.D on 02/17/2016 at 4:03 PM  Between 7am to 6pm - Pager - [phone]  After 6pm go to [email or web address] - password [email or web address]  [facility] Office  [phone]  CC: Primary care physician; Dr. Vonita Moss

## 2016-02-17 NOTE — Clinical Social Work Note (Deleted)
Patient to discharge tomorrow to [facility] after dialysis. CSW has informed Nikki, with admissions, of this information. Jamaris Theard MSW,LCSW [phone] 

## 2016-02-17 NOTE — Progress Notes (Signed)
CM spoke with Magda Bernheimarrie Olivarez to obtain choice of agency to provide HHPT. [facility]. After reviewing list they have chosen [facility] to provide HHPT. I called and notified Barbara CowerJason, [facility]. No further CM needs at this time but will be available should additional needs arise.

## 2016-02-17 NOTE — Progress Notes (Signed)
Patient ID: Larry Buckley Streett, male   DOB: 10-Feb-1941, 75 y.o.   MRN: [id number]  [facility] PROGRESS NOTE  Larry Buckley Duggan WUJ:[id number] DOB: 10-Feb-1941 DOA: 02/08/2016 PCP: No primary care provider on file.  HPI/Subjective: Patient did not sleep last night.  Sitter had to be restated. Patient feels okay, offers no complaints  Objective: Filed Vitals:   02/16/16 1259 02/17/16 0523  BP: 170/83 172/87  Pulse: 90 97  Temp: 99.8 F (37.7 C) 99.2 F (37.3 C)  Resp: 20 20    Filed Weights   02/08/16 1547 02/08/16 2349  Weight: 86.183 kg (190 lb) 91.7 kg (202 lb 2.6 oz)    ROS: Review of Systems  Constitutional: Negative for weight loss.  Respiratory: Positive for cough. Negative for shortness of breath.   Cardiovascular: Negative for chest pain.  Gastrointestinal: Negative for nausea, vomiting, abdominal pain, diarrhea and constipation.  Genitourinary: Negative for urgency.  Musculoskeletal: Negative for joint pain.  Neurological: Negative for focal weakness and headaches.  Endo/Heme/Allergies: Does not bruise/bleed easily.   Exam: Physical Exam  HENT:  Nose: No mucosal edema.  Mouth/Throat: No oropharyngeal exudate or posterior oropharyngeal edema.  Eyes: Conjunctivae and lids are normal. Pupils are equal, round, and reactive to light.  Neck: No JVD present. Carotid bruit is not present. No edema present. No thyroid mass and no thyromegaly present.  Cardiovascular: S1 normal and S2 normal.  Exam reveals no gallop.   No murmur heard. Pulses:      Dorsalis pedis pulses are 2+ on the right side, and 2+ on the left side.  Respiratory: No respiratory distress. He has no wheezes. He has no rhonchi. He has no rales.  GI: Soft. Bowel sounds are normal. There is no tenderness.  Musculoskeletal:       Right ankle: He exhibits swelling.       Left ankle: He exhibits swelling.  Lymphadenopathy:    He has no cervical adenopathy.  Neurological: He is alert.  Follow simple  commands  Skin: Skin is warm. No rash noted. Nails show no clubbing.  Psychiatric: He has a normal mood and affect.      Data Reviewed: Basic Metabolic Panel:  Recent Labs Lab 02/11/16 0439 02/16/16 0359  NA 139 140  K 4.2 3.2*  CL 108 106  CO2 23 24  GLUCOSE 105* 129*  BUN 21* 14  CREATININE 0.87 0.73  CALCIUM 8.3* 8.3*   CBC:  Recent Labs Lab 02/11/16 0439 02/13/16 0930 02/16/16 0359  WBC 11.0* 10.4 9.2  HGB 8.8* 10.1* 9.2*  HCT 26.2* 30.8* 26.6*  MCV 91.5 94.1 91.9  PLT 159 188 281    Recent Results (from the past 240 hour(s))  MRSA PCR Screening     Status: None   Collection Time: 02/09/16  1:14 AM  Result Value Ref Range Status   MRSA by PCR NEGATIVE NEGATIVE Final    Comment:        The GeneXpert MRSA Assay (FDA approved for NASAL specimens only), is one component of a comprehensive MRSA colonization surveillance program. It is not intended to diagnose MRSA infection nor to guide or monitor treatment for MRSA infections.       Scheduled Meds: . amoxicillin-clavulanate  1 tablet Oral Q12H  . aspirin EC  81 mg Oral Daily  . docusate sodium  100 mg Oral Daily  . feeding supplement (ENSURE ENLIVE)  237 mL Oral BID BM  . folic acid  1 mg Oral Daily  . multivitamin  with minerals  1 tablet Oral Daily  . QUEtiapine  25 mg Oral QHS  . thiamine  100 mg Oral Daily   Or  . thiamine  100 mg Intravenous Daily    Assessment/Plan:  1. Left base pneumonia and fever. Augmentin upon discharge 2. acute encephalopathy likely secondary to pneumonia, demenita. I think the alcohol delirium withdrawal is through at this point. Try not to give anymore ativan because he gets more lethargic.  Haldol prn, seroquel at night. 3. Aortic aneurysm rupture status post endovascular stent by Dr. Wyn QuakerDew.  4. Acute kidney injury secondary to ATN and volume loss. Improved 5. Hypokalemia improved 6. Postoperative ileus. Resolved 7. Essential hypertension. Continue to  monitor 8. Weakness. Physical therapy recommended rehabilitation.   Family Communication: Spoke with wife on phone Disposition Plan: Home with home health  Consultants:  Vascular surgery  Procedures:  Endovascular stent  Time spent: 35 minutes, greater than 50% of the time spent in coordination of care.  Alford Highland  [employer]

## 2016-02-17 NOTE — Discharge Instructions (Signed)
No Driving No Beer   Abdominal Aortic Aneurysm An aneurysm is a weakened or damaged part of an artery wall that bulges from the normal force of blood pumping through the body. An abdominal aortic aneurysm is an aneurysm that occurs in the lower part of the aorta, the main artery of the body.  The major concern with an abdominal aortic aneurysm is that it can enlarge and burst (rupture) or blood can flow between the layers of the wall of the aorta through a tear (aorticdissection). Both of these conditions can cause bleeding inside the body and can be life threatening unless diagnosed and treated promptly. CAUSES  The exact cause of an abdominal aortic aneurysm is unknown. Some contributing factors are:   A hardening of the arteries caused by the buildup of fat and other substances in the lining of a blood vessel (arteriosclerosis).  Inflammation of the walls of an artery (arteritis).   Connective tissue diseases, such as Marfan syndrome.   Abdominal trauma.   An infection, such as syphilis or staphylococcus, in the wall of the aorta (infectious aortitis) caused by bacteria. RISK FACTORS  Risk factors that contribute to an abdominal aortic aneurysm may include:  Age older than 60 years.   High blood pressure (hypertension).  Male gender.  Ethnicity (white race).  Obesity.  Family history of aneurysm (first degree relatives only).  Tobacco use. PREVENTION  The following healthy lifestyle habits may help decrease your risk of abdominal aortic aneurysm:  Quitting smoking. Smoking can raise your blood pressure and cause arteriosclerosis.  Limiting or avoiding alcohol.  Keeping your blood pressure, blood sugar level, and cholesterol levels within normal limits.  Decreasing your salt intake. In somepeople, too much salt can raise blood pressure and increase your risk of abdominal aortic aneurysm.  Eating a diet low in saturated fats and cholesterol.  Increasing your  fiber intake by including whole grains, vegetables, and fruits in your diet. Eating these foods may help lower blood pressure.  Maintaining a healthy weight.  Staying physically active and exercising regularly. SYMPTOMS  The symptoms of abdominal aortic aneurysm may vary depending on the size and rate of growth of the aneurysm.Most grow slowly and do not have any symptoms. When symptoms do occur, they may include:  Pain (abdomen, side, lower back, or groin). The pain may vary in intensity. A sudden onset of severe pain may indicate that the aneurysm has ruptured.  Feeling full after eating only small amounts of food.  Nausea or vomiting or both.  Feeling a pulsating lump in the abdomen.  Feeling faint or passing out. DIAGNOSIS  Since most unruptured abdominal aortic aneurysms have no symptoms, they are often discovered during diagnostic exams for other conditions. An aneurysm may be found during the following procedures:  Ultrasonography (A one-time screening for abdominal aortic aneurysm by ultrasonography is also recommended for all men aged 65-75 years who have ever smoked).  X-ray exams.  A computed tomography (CT).  Magnetic resonance imaging (MRI).  Angiography or arteriography. TREATMENT  Treatment of an abdominal aortic aneurysm depends on the size of your aneurysm, your age, and risk factors for rupture. Medication to control blood pressure and pain may be used to manage aneurysms smaller than 6 cm. Regular monitoring for enlargement may be recommended by your caregiver if:  The aneurysm is 3-4 cm in size (an annual ultrasonography may be recommended).  The aneurysm is 4-4.5 cm in size (an ultrasonography every 6 months may be recommended).  The aneurysm  is larger than 4.5 cm in size (your caregiver may ask that you be examined by a vascular surgeon). If your aneurysm is larger than 6 cm, surgical repair may be recommended. There are two main methods for repair of an  aneurysm:   Endovascular repair (a minimally invasive surgery). This is done most often.  Open repair. This method is used if an endovascular repair is not possible.   This information is not intended to replace advice given to you by your health care provider. Make sure you discuss any questions you have with your health care provider.   Document Released: 07/12/2005 Document Revised: 01/27/2013 Document Reviewed: 11/01/2012 Elsevier Interactive Patient Education [employer]2016 Elsevier Inc.

## 2016-02-29 ENCOUNTER — Encounter: Payer: Self-pay | Admitting: Emergency Medicine

## 2016-02-29 ENCOUNTER — Ambulatory Visit: Payer: Self-pay | Admitting: Unknown Physician Specialty

## 2016-02-29 ENCOUNTER — Emergency Department
Admission: EM | Admit: 2016-02-29 | Discharge: 2016-02-29 | Disposition: A | Discharge status: Home / Self Care | Payer: Medicare Other | Attending: Emergency Medicine | Admitting: Emergency Medicine

## 2016-02-29 ENCOUNTER — Emergency Department: Payer: Medicare Other

## 2016-02-29 DIAGNOSIS — Z79899 Other long term (current) drug therapy: Secondary | ICD-10-CM | POA: Diagnosis not present

## 2016-02-29 DIAGNOSIS — Z8679 Personal history of other diseases of the circulatory system: Secondary | ICD-10-CM | POA: Insufficient documentation

## 2016-02-29 DIAGNOSIS — Y999 Unspecified external cause status: Secondary | ICD-10-CM | POA: Insufficient documentation

## 2016-02-29 DIAGNOSIS — S36892A Contusion of other intra-abdominal organs, initial encounter: Secondary | ICD-10-CM | POA: Insufficient documentation

## 2016-02-29 DIAGNOSIS — I10 Essential (primary) hypertension: Secondary | ICD-10-CM | POA: Insufficient documentation

## 2016-02-29 DIAGNOSIS — K661 Hemoperitoneum: Secondary | ICD-10-CM

## 2016-02-29 DIAGNOSIS — R109 Unspecified abdominal pain: Secondary | ICD-10-CM

## 2016-02-29 DIAGNOSIS — Z87891 Personal history of nicotine dependence: Secondary | ICD-10-CM | POA: Diagnosis not present

## 2016-02-29 DIAGNOSIS — Y929 Unspecified place or not applicable: Secondary | ICD-10-CM | POA: Diagnosis not present

## 2016-02-29 DIAGNOSIS — X501XXA Overexertion from prolonged static or awkward postures, initial encounter: Secondary | ICD-10-CM | POA: Diagnosis not present

## 2016-02-29 DIAGNOSIS — Y9389 Activity, other specified: Secondary | ICD-10-CM | POA: Diagnosis not present

## 2016-02-29 DIAGNOSIS — E785 Hyperlipidemia, unspecified: Secondary | ICD-10-CM | POA: Diagnosis not present

## 2016-02-29 DIAGNOSIS — Z7982 Long term (current) use of aspirin: Secondary | ICD-10-CM | POA: Insufficient documentation

## 2016-02-29 LAB — CBC WITH DIFFERENTIAL/PLATELET
Basophils Absolute: 0.1 10*3/uL (ref 0–0.1)
Basophils Relative: 1 %
EOS PCT: 3 %
Eosinophils Absolute: 0.1 10*3/uL (ref 0–0.7)
HCT: 29.5 % — ABNORMAL LOW (ref 40.0–52.0)
Hemoglobin: 10.1 g/dL — ABNORMAL LOW (ref 13.0–18.0)
Lymphocytes Relative: 17 %
Lymphs Abs: 1 10*3/uL (ref 1.0–3.6)
MCH: 31.3 pg (ref 26.0–34.0)
MCHC: 34.4 g/dL (ref 32.0–36.0)
MCV: 91 fL (ref 80.0–100.0)
MONO ABS: 0.7 10*3/uL (ref 0.2–1.0)
Monocytes Relative: 12 %
NEUTROS ABS: 4 10*3/uL (ref 1.4–6.5)
NEUTROS PCT: 67 %
PLATELETS: 606 10*3/uL — AB (ref 150–440)
RBC: 3.24 MIL/uL — ABNORMAL LOW (ref 4.40–5.90)
RDW: 15.8 % — AB (ref 11.5–14.5)
WBC: 5.9 10*3/uL (ref 3.8–10.6)

## 2016-02-29 LAB — BASIC METABOLIC PANEL
ANION GAP: 6 (ref 5–15)
BUN: 9 mg/dL (ref 6–20)
CALCIUM: 8.5 mg/dL — AB (ref 8.9–10.3)
CO2: 26 mmol/L (ref 22–32)
CREATININE: 0.8 mg/dL (ref 0.61–1.24)
Chloride: 108 mmol/L (ref 101–111)
Glucose, Bld: 103 mg/dL — ABNORMAL HIGH (ref 65–99)
Potassium: 3.8 mmol/L (ref 3.5–5.1)
SODIUM: 140 mmol/L (ref 135–145)

## 2016-02-29 LAB — APTT: aPTT: 31 seconds (ref 24–36)

## 2016-02-29 LAB — PROTIME-INR
INR: 1.12
Prothrombin Time: 14.6 seconds (ref 11.4–15.0)

## 2016-02-29 IMAGING — CT CT CTA ABD/PEL W/CM AND/OR W/O CM
2 of 9 series · 9 of 46 positions shown, 15 images · IV contrast (APPLIED)
Comparison: [DATE]

CLINICAL DATA: Abdominal aortic aneurysm with rupture

EXAM:
CTA ABDOMEN AND PELVIS wITHOUT AND WITH CONTRAST
TECHNIQUE: Multidetector CT imaging of the abdomen and pelvis was performed
using the standard protocol during bolus administration of
intravenous contrast. Multiplanar reconstructed images and MIPs were
obtained and reviewed to evaluate the vascular anatomy.
CONTRAST:  100 cc Isovue 370

[Series 6: venous · axial · portal-venous · 0.67mm/px · z∈[-955,-625]mm · 7 of 88 slices shown, 12 images]
[im 11/88  soft-tissue]
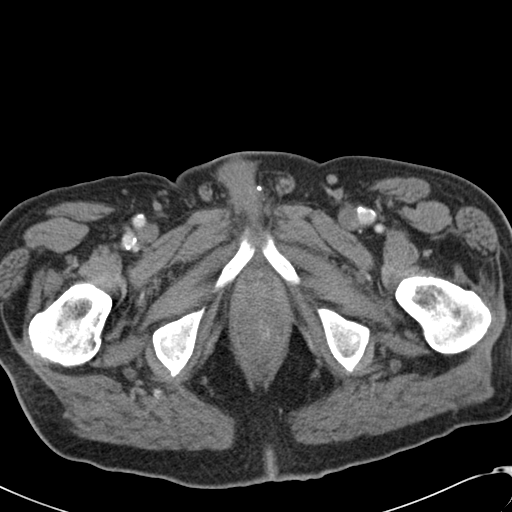
[im 11/88  bone]
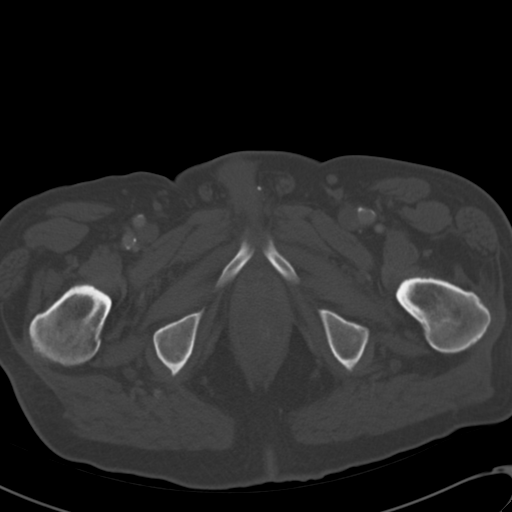
[im 22/88  soft-tissue]
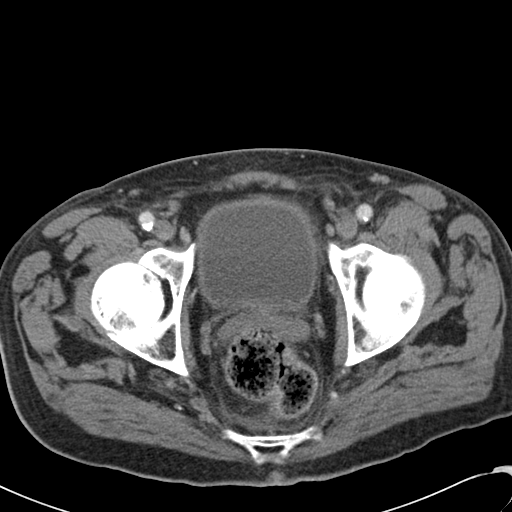
[im 33/88  soft-tissue]
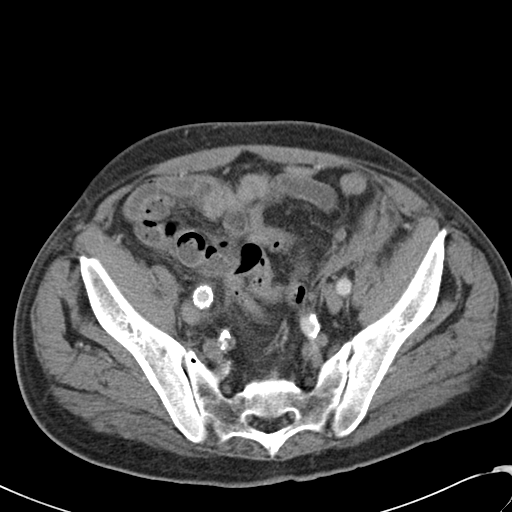
[im 44/88  soft-tissue]
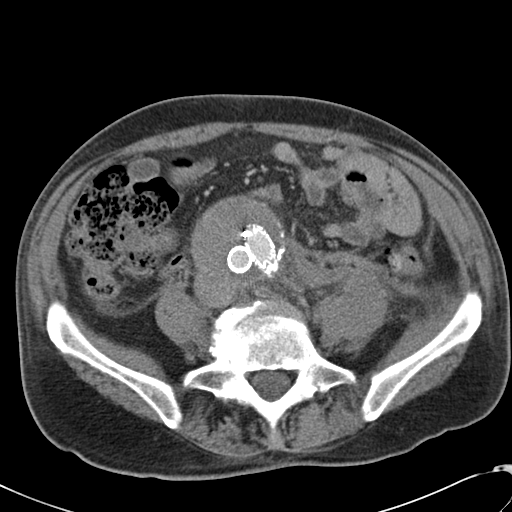
[im 44/88  lung]
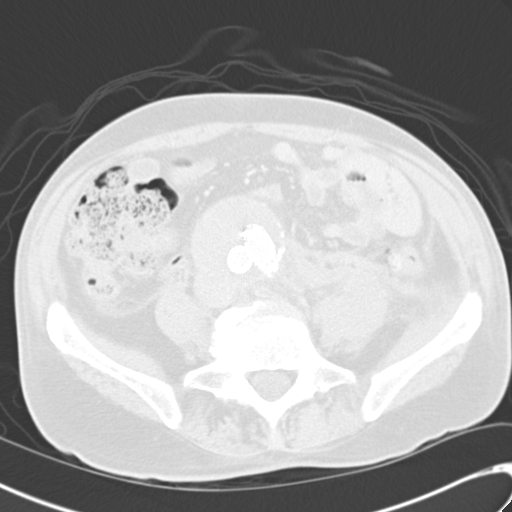
[im 55/88  soft-tissue]
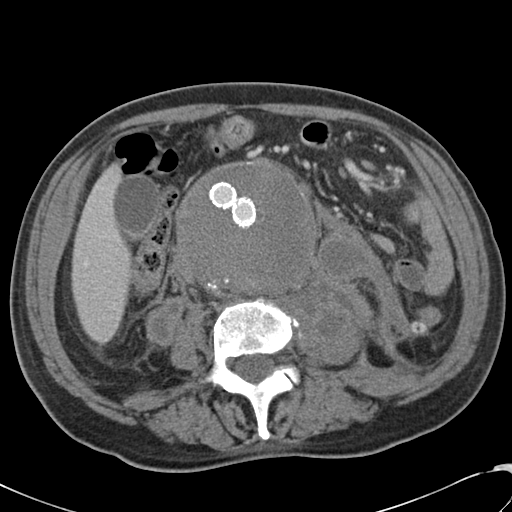
[im 55/88  lung]
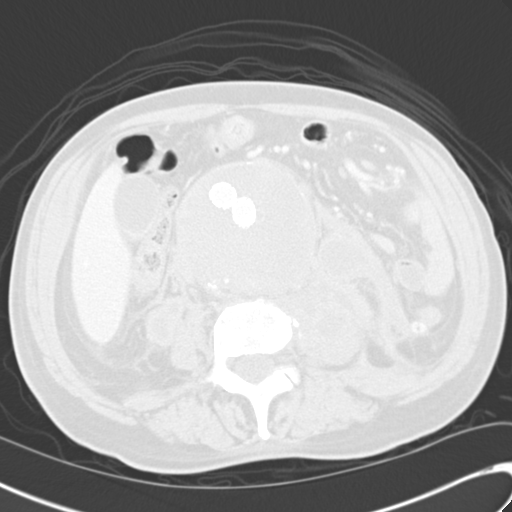
[im 66/88  soft-tissue]
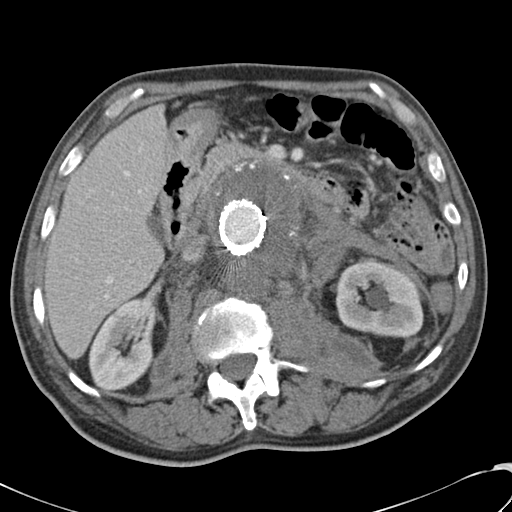
[im 66/88  lung]
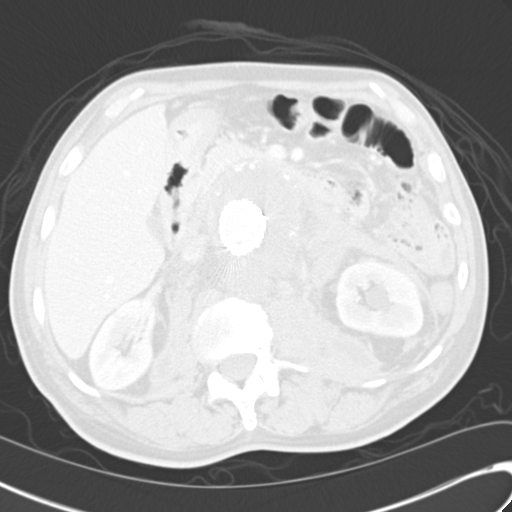
[im 77/88  soft-tissue]
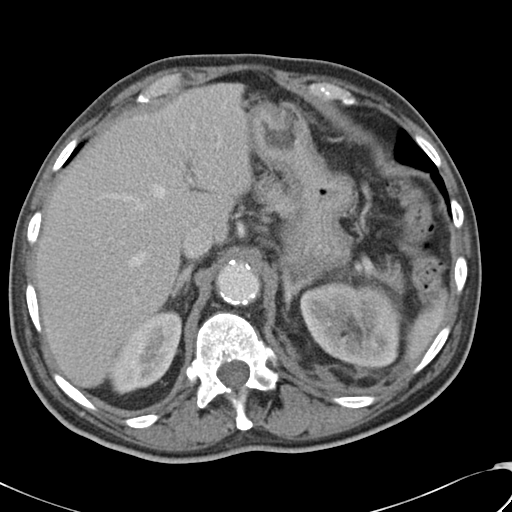
[im 77/88  lung]
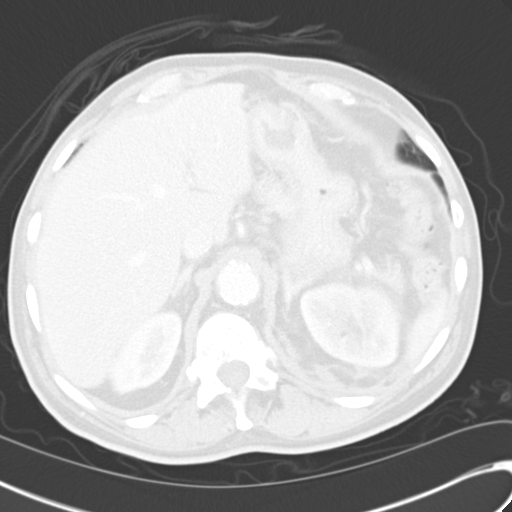

[Series 8: cor arterial mpr · coronal · arterial · 0.86mm/px · 2 of 126 slices shown, 3 images]
[im 42/126  soft-tissue]
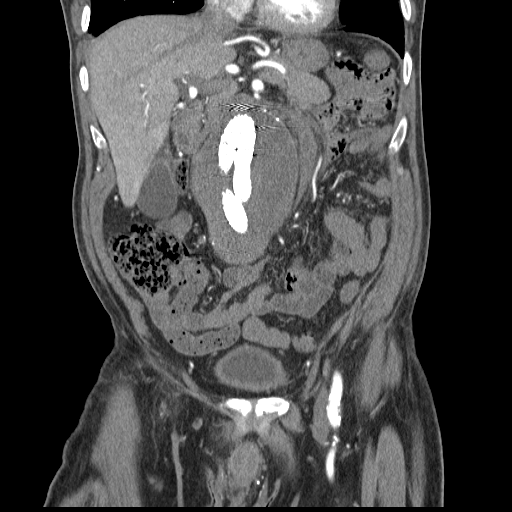
[im 42/126  bone]
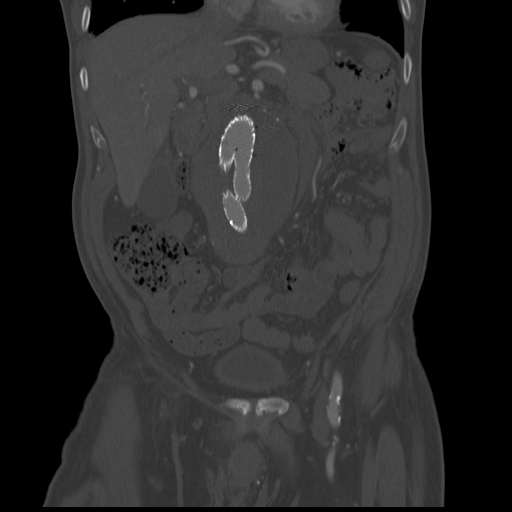
[im 84/126  soft-tissue]
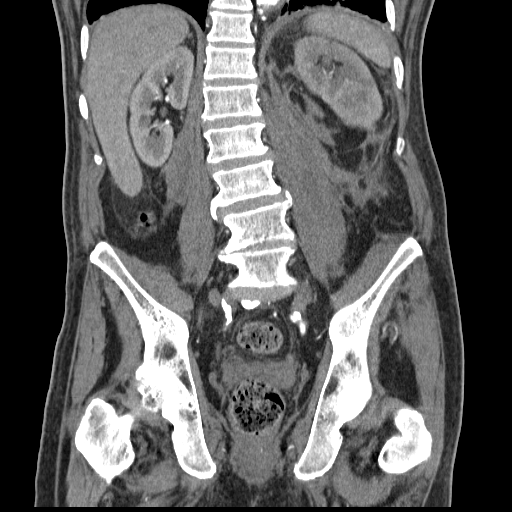

[9 of 46 positions shown; findings below may reference images not displayed]

FINDINGS: Aorto bi-iliac stent graft has been placed for ruptured abdominal
aortic aneurysm and active extravasation, noted on the prior study.
Maximal AP and transverse aneurysm sac diameters today are 8.8 and
9.4 cm. Previously, maximal diameter was 8.9 cm. The stent graft
extends from the level of the renal arteries into the iliac arteries
bilaterally. The aortic and iliac graft lumens are patent. There is
no evidence of endoleak. There is no evidence of extravasation to
suggest rupture. The endo graft covers and excludes the area of
contrast extravasation and rupture noted on the prior study this
occurred in the upper aneurysm sac. Retroperitoneal hemorrhage
surrounding the aortic aneurysm has evolved with the areas of low
density compatible with liquefaction. The overall volume of
retroperitoneal hemorrhage is not significantly changed. There is
less in the right retroperitoneum and a more volume and left
retroperitoneum. The largest localized liquified hematoma is
inferior to the left kidney measuring 2.9 cm. There are several
smaller liquified areas within the left psoas muscle. This may be
contributing to the patient's left flank pain.

Celiac narrowing secondary to median arcuate ligament syndrome is
present. Branch vessels are patent.

SMA is patent with atherosclerotic calcification at its origin.

IMA origin is occluded.  Branch vessels reconstitute

Atherosclerotic plaque at the origin of the right renal artery. It
is patent. There is ectasia of the peripheral right renal artery
measuring up to 8 mm in caliber. Similarly, there is atherosclerotic
plaque at the origin of the left renal artery without significant
narrowing. It is patent beyond its origin as well.

The right iliac stent graft limb excludes a right common iliac
artery aneurysm. Aneurysm sac diameter is 2.9 cm. Previously,
maximal diameter of the right common iliac artery aneurysm was
cm. There is no evidence of endoleak. The right internal iliac
artery main trunk is occluded. Branch vessels reconstitute. Landing
zone of the right iliac limb occurs in the proximal right external
iliac artery. There is a dissection extending from the landing zone
into the mid right external iliac artery. It does not appear flow
limiting. Contrast opacification on either side of the dissection is
congruent.

Left iliac graft landing zone occurs in the distal left common iliac
artery. It is patent. Left internal and external iliac arteries are
patent without aneurysm or dissection.

Tiny pleural effusions and dependent atelectasis

Liver is unremarkable

Gallbladder wall thickening and edema is nonspecific

Spleen is diminutive.

Pancreas and adrenal glands are within normal limits

No focal bowel wall thickening or dilatation. Diverticulosis of the
descending and sigmoid colon.

Degenerative disc disease in the lower lumbar spine. No vertebral
compression deformity.

Review of the MIP images confirms the above findings.
IMPRESSION: Status post aorto bi-iliac stent graft has been placed across the
area of aortic aneurysm rupture. Graft is patent. No evidence of
aortic rupture. No evidence of endoleak. Aneurysm sac diameter has
increased from 8.9 cm to 9.3 cm.

Retroperitoneal hemorrhage about the aortic aneurysm has evolved.
Overall volume is not significantly changed. It is less and in the
retroperitoneum but has slightly increased in the left
retroperitoneum. There are many liquified areas within the left
retroperitoneum. The largest is 2.9 cm below the left kidney. There
also areas within the left psoas muscle causing expansion of the
muscle. This may be contributing to the patient's left flank pain.

The right iliac limb excludes a right common iliac artery aneurysm.
The aneurysm sac is thrombosed without endoleak. Right internal
iliac artery origin is also occluded without coils. Right common
iliac artery aneurysm sac measures 2.9 cm and previously measured
3.0 cm

There is a non flow limiting dissection within the right external
iliac artery.

Tiny pleural effusions.

## 2016-02-29 MED ORDER — IOPAMIDOL (ISOVUE-370) INJECTION 76%
100.0000 mL | Freq: Once | INTRAVENOUS | Status: AC | PRN
  Administered 2016-02-29: 100 mL via INTRAVENOUS

## 2016-02-29 MED ORDER — OXYCODONE-ACETAMINOPHEN 5-325 MG PO TABS: 1.0000 | ORAL_TABLET | Freq: Four times a day (QID) | ORAL | Status: DC | PRN

## 2016-02-29 NOTE — Discharge Instructions (Signed)
You were prescribed a medication that is potentially sedating. Do not drink alcohol, drive or participate in any other potentially dangerous activities while taking this medication as it may make you sleepy. Do not take this medication with any other sedating medications, either prescription or over-the-counter. If you were prescribed Percocet or Vicodin, do not take these with acetaminophen (Tylenol) as it is already contained within these medications.   Opioid pain medications (or "narcotics") can be habit forming.  Use it as little as possible to achieve adequate pain control.  Do not use or use it with extreme caution if you have a history of opiate abuse or dependence.  If you are on a pain contract with your primary care doctor or a pain specialist, be sure to let them know you were prescribed this medication today from the [facility] Emergency Department.  This medication is intended for your use only - do not give any to anyone else and keep it in a secure place where nobody else, especially children and pets, have access to it.  It will also cause or worsen constipation, so you may want to consider taking an over-the-counter stool softener while you are taking this medication.  Abdominal Pain, Adult Many things can cause abdominal pain. Usually, abdominal pain is not caused by a disease and will improve without treatment. It can often be observed and treated at home. Your health care provider will do a physical exam and possibly order blood tests and X-rays to help determine the seriousness of your pain. However, in many cases, more time must pass before a clear cause of the pain can be found. Before that point, your health care provider may not know if you need more testing or further treatment. HOME CARE INSTRUCTIONS Monitor your abdominal pain for any changes. The following actions may help to alleviate any discomfort you are experiencing:  Only take over-the-counter or prescription  medicines as directed by your health care provider.  Do not take laxatives unless directed to do so by your health care provider.  Try a clear liquid diet (broth, tea, or water) as directed by your health care provider. Slowly move to a bland diet as tolerated. SEEK MEDICAL CARE IF:  You have unexplained abdominal pain.  You have abdominal pain associated with nausea or diarrhea.  You have pain when you urinate or have a bowel movement.  You experience abdominal pain that wakes you in the night.  You have abdominal pain that is worsened or improved by eating food.  You have abdominal pain that is worsened with eating fatty foods.  You have a fever. SEEK IMMEDIATE MEDICAL CARE IF:  Your pain does not go away within 2 hours.  You keep throwing up (vomiting).  Your pain is felt only in portions of the abdomen, such as the right side or the left lower portion of the abdomen.  You pass bloody or black tarry stools. MAKE SURE YOU:  Understand these instructions.  Will watch your condition.  Will get help right away if you are not doing well or get worse.   This information is not intended to replace advice given to you by your health care provider. Make sure you discuss any questions you have with your health care provider.   Document Released: 07/12/2005 Document Revised: 06/23/2015 Document Reviewed: 06/11/2013 Elsevier Interactive Patient Education 2016 Elsevier Inc.  Hematoma A hematoma is a collection of blood under the skin, in an organ, in a body space, in a joint  space, or in other tissue. The blood can clot to form a lump that you can see and feel. The lump is often firm and may sometimes become sore and tender. Most hematomas get better in a few days to weeks. However, some hematomas may be serious and require medical care. Hematomas can range in size from very small to very large. CAUSES  A hematoma can be caused by a blunt or penetrating injury. It can also be  caused by spontaneous leakage from a blood vessel under the skin. Spontaneous leakage from a blood vessel is more likely to occur in older people, especially those taking blood thinners. Sometimes, a hematoma can develop after certain medical procedures. SIGNS AND SYMPTOMS   A firm lump on the body.  Possible pain and tenderness in the area.  Bruising.Blue, dark blue, purple-red, or yellowish skin may appear at the site of the hematoma if the hematoma is close to the surface of the skin. For hematomas in deeper tissues or body spaces, the signs and symptoms may be subtle. For example, an intra-abdominal hematoma may cause abdominal pain, weakness, fainting, and shortness of breath. An intracranial hematoma may cause a headache or symptoms such as weakness, trouble speaking, or a change in consciousness. DIAGNOSIS  A hematoma can usually be diagnosed based on your medical history and a physical exam. Imaging tests may be needed if your health care provider suspects a hematoma in deeper tissues or body spaces, such as the abdomen, head, or chest. These tests may include ultrasonography or a CT scan.  TREATMENT  Hematomas usually go away on their own over time. Rarely does the blood need to be drained out of the body. Large hematomas or those that may affect vital organs will sometimes need surgical drainage or monitoring. HOME CARE INSTRUCTIONS   Apply ice to the injured area:   Put ice in a plastic bag.   Place a towel between your skin and the bag.   Leave the ice on for 20 minutes, 2-3 times a day for the first 1 to 2 days.   After the first 2 days, switch to using warm compresses on the hematoma.   Elevate the injured area to help decrease pain and swelling. Wrapping the area with an elastic bandage may also be helpful. Compression helps to reduce swelling and promotes shrinking of the hematoma. Make sure the bandage is not wrapped too tight.   If your hematoma is on a lower  extremity and is painful, crutches may be helpful for a couple days.   Only take over-the-counter or prescription medicines as directed by your health care provider. SEEK IMMEDIATE MEDICAL CARE IF:   You have increasing pain, or your pain is not controlled with medicine.   You have a fever.   You have worsening swelling or discoloration.   Your skin over the hematoma breaks or starts bleeding.   Your hematoma is in your chest or abdomen and you have weakness, shortness of breath, or a change in consciousness.  Your hematoma is on your scalp (caused by a fall or injury) and you have a worsening headache or a change in alertness or consciousness. MAKE SURE YOU:   Understand these instructions.  Will watch your condition.  Will get help right away if you are not doing well or get worse.   This information is not intended to replace advice given to you by your health care provider. Make sure you discuss any questions you have with your health  care provider.   Document Released: 05/16/2004 Document Revised: 06/04/2013 Document Reviewed: 03/12/2013 Elsevier Interactive Patient Education [employer]2016 Elsevier Inc.

## 2016-02-29 NOTE — ED Provider Notes (Signed)
[facility] Emergency Department Provider Note  ____________________________________________  Time seen: 9:55 AM  I have reviewed the triage vital signs and the nursing notes.   HISTORY  Chief Complaint Flank Pain    HPI Larry Buckley is a 75 y.o. male comes to the ED with constant left flank pain since he had a endovascular repair of ruptured AAA 3 weeks ago. Pain is steady, not worsening, hurts more when he turns on his side or flexes his hip. No fevers or chills night sweats. No dizziness or syncope chest pain or shortness of breath. No pain or coldness in the legs.Pain is moderate intensity, nonradiating.   Past Medical History  Diagnosis Date  . Hyperlipidemia   . Hypertension   . Gout, arthropathy      Patient Active Problem List   Diagnosis Date Noted  . Ruptured abdominal aortic aneurysm (AAA) (HCC) 02/08/2016  . Hyperlipidemia   . Hypertension   . Gout, arthropathy      Past Surgical History  Procedure Laterality Date  . Peripheral vascular catheterization N/A 02/08/2016    Procedure: Endovascular Repair/Stent Graft;  Surgeon: Annice NeedyJason S Dew, MD;  Location: [facility] INVASIVE CV LAB;  Service: Cardiovascular;  Laterality: N/A;     Current Outpatient Rx  Name  Route  Sig  Dispense  Refill  . aspirin EC 81 MG tablet   Oral   Take 1 tablet (81 mg total) by mouth daily.   150 tablet   2   . docusate sodium (COLACE) 100 MG capsule   Oral   Take 1 capsule (100 mg total) by mouth daily.   10 capsule   0   . Multiple Vitamin (MULTIVITAMIN WITH MINERALS) TABS tablet   Oral   Take 1 tablet by mouth daily.   30 tablet   5   . QUEtiapine (SEROQUEL) 25 MG tablet   Oral   Take 1 tablet (25 mg total) by mouth at bedtime.   30 tablet   0   . amoxicillin-clavulanate (AUGMENTIN) 875-125 MG tablet   Oral   Take 1 tablet by mouth every 12 (twelve) hours.   8 tablet   0   . feeding supplement, ENSURE ENLIVE, (ENSURE ENLIVE) LIQD    Oral   Take 237 mLs by mouth 2 (two) times daily between meals.   60 Bottle   0   . oxyCODONE-acetaminophen (ROXICET) 5-325 MG tablet   Oral   Take 1 tablet by mouth every 6 (six) hours as needed for severe pain.   12 tablet   0      Allergies Lotensin   Family History  Problem Relation Age of Onset  . Cancer Mother   . Diabetes Mother   . Heart disease Father   . Hypertension Father   . Heart disease Brother     Social History Social History  Substance Use Topics  . Smoking status: Former Games developermoker  . Smokeless tobacco: None  . Alcohol Use: Yes    Review of Systems  Constitutional:   No fever or chills.  Eyes:   No vision changes.  ENT:   No sore throat. No rhinorrhea. Cardiovascular:   No chest pain. Respiratory:   No dyspnea or cough. Gastrointestinal:   Positive abdominal and left flank pain as above, no vomiting and diarrhea.  Genitourinary:   Negative for dysuria or difficulty urinating. Musculoskeletal:   Negative for focal pain or swelling Neurological:   Negative for headaches 10-point ROS otherwise negative.  ____________________________________________   PHYSICAL EXAM:  VITAL SIGNS: ED Triage Vitals  Enc Vitals Group     BP 02/29/16 0934 180/93 mmHg     Pulse Rate 02/29/16 0934 73     Resp 02/29/16 0934 20     Temp 02/29/16 0934 97.5 F (36.4 C)     Temp Source 02/29/16 0934 Oral     SpO2 02/29/16 0934 99 %     Weight 02/29/16 0934 195 lb (88.451 kg)     Height 02/29/16 0934 6\' 1"  (1.854 m)     Head Cir --      Peak Flow --      Pain Score 02/29/16 0935 5     Pain Loc --      Pain Edu? --      Excl. in GC? --     Vital signs reviewed, nursing assessments reviewed.   Constitutional:   Alert and oriented. Well appearing and in no distress. Eyes:   No scleral icterus. No conjunctival pallor. PERRL. EOMI.  No nystagmus. ENT   Head:   Normocephalic and atraumatic.   Nose:   No congestion/rhinnorhea. No septal hematoma    Mouth/Throat:   MMM, no pharyngeal erythema. No peritonsillar mass.    Neck:   No stridor. No SubQ emphysema. No meningismus. Hematological/Lymphatic/Immunilogical:   No cervical lymphadenopathy. Cardiovascular:   RRR. Symmetric bilateral radial and DP pulses.  No murmurs.  Respiratory:   Normal respiratory effort without tachypnea nor retractions. Breath sounds are clear and equal bilaterally. No wheezes/rales/rhonchi. Gastrointestinal:   Soft With left lower quadrant tenderness. Non distended. There is no CVA tenderness.  No rebound, rigidity, or guarding. Genitourinary:   deferred Musculoskeletal:   Nontender with normal range of motion in all extremities. No joint effusions.  No lower extremity tenderness.  No edema. No midline spinal tenderness Neurologic:   Normal speech and language.  CN 2-10 normal. Motor grossly intact. No gross focal neurologic deficits are appreciated.  Skin:    Skin is warm, dry and intact. No rash noted.  No petechiae, purpura, or bullae.  ____________________________________________    LABS (pertinent positives/negatives) (all labs ordered are listed, but only abnormal results are displayed) Labs Reviewed  BASIC METABOLIC PANEL - Abnormal; Notable for the following:    Glucose, Bld 103 (*)    Calcium 8.5 (*)    All other components within normal limits  CBC WITH DIFFERENTIAL/PLATELET - Abnormal; Notable for the following:    RBC 3.24 (*)    Hemoglobin 10.1 (*)    HCT 29.5 (*)    RDW 15.8 (*)    Platelets 606 (*)    All other components within normal limits  PROTIME-INR  APTT   ____________________________________________   EKG    ____________________________________________    RADIOLOGY  CT angiogram abdomen pelvis reveals successful endovascular repair of AAA, aneurysm sac is excluded without in the leak. There is evolution of the hematoma with watery bloody breakdown products in the retroperitoneum along the left psoas. Nonflow  limiting dissection of right iliac artery  ____________________________________________   PROCEDURES   ____________________________________________   INITIAL IMPRESSION / ASSESSMENT AND PLAN / ED COURSE  Pertinent labs & imaging results that were available during my care of the patient were reviewed by me and considered in my medical decision making (see chart for details).  Patient well appearing no acute distress, presents with left leg pain and left lower quadrant tenderness after recent AAA repair. Labs and CT do not reveal any acute  findings or complications other than expected course with evolution of hematoma in the retroperitoneum and psoas. I discussed the findings with Dr. Wyn Quaker who agrees. He does caution to have a low threshold for starting antibiotics with the hematomas, but the patient is afebrile, calm and comfortable, no white count. Has follow-up appointment with Dr. Wyn Quaker in about one week. We'll discharge home with pain medication.     ____________________________________________   FINAL CLINICAL IMPRESSION(S) / ED DIAGNOSES  Final diagnoses:  Retroperitoneal hematoma  Left flank pain       Portions of this note were generated with dragon dictation software. Dictation errors may occur despite best attempts at proofreading.   Sharman Cheek, MD 02/29/16 [phone]

## 2016-02-29 NOTE — ED Notes (Signed)
Pt to ed with c/o left flank pain x 2 weeks.  Pt was recently hospitalized and states the pain has been there since d/c.  Pt denies difficulty and denies pain with urinating.

## 2016-08-04 ENCOUNTER — Ambulatory Visit (INDEPENDENT_AMBULATORY_CARE_PROVIDER_SITE_OTHER): Payer: Medicare Other | Admitting: Vascular Surgery

## 2016-08-04 ENCOUNTER — Other Ambulatory Visit (INDEPENDENT_AMBULATORY_CARE_PROVIDER_SITE_OTHER): Payer: Self-pay | Admitting: Vascular Surgery

## 2016-08-04 ENCOUNTER — Encounter (INDEPENDENT_AMBULATORY_CARE_PROVIDER_SITE_OTHER): Payer: Self-pay | Admitting: Vascular Surgery

## 2016-08-04 ENCOUNTER — Ambulatory Visit (INDEPENDENT_AMBULATORY_CARE_PROVIDER_SITE_OTHER): Payer: Medicare Other

## 2016-08-04 VITALS — BP 133/66 | HR 68 | Resp 17 | Ht 71.0 in | Wt 184.0 lb

## 2016-08-04 DIAGNOSIS — I10 Essential (primary) hypertension: Secondary | ICD-10-CM

## 2016-08-04 DIAGNOSIS — T82330A Leakage of aortic (bifurcation) graft (replacement), initial encounter: Secondary | ICD-10-CM

## 2016-08-04 DIAGNOSIS — I713 Abdominal aortic aneurysm, ruptured, unspecified: Secondary | ICD-10-CM

## 2016-08-04 DIAGNOSIS — I739 Peripheral vascular disease, unspecified: Secondary | ICD-10-CM

## 2016-08-04 DIAGNOSIS — I714 Abdominal aortic aneurysm, without rupture, unspecified: Secondary | ICD-10-CM

## 2016-08-04 DIAGNOSIS — E785 Hyperlipidemia, unspecified: Secondary | ICD-10-CM | POA: Diagnosis not present

## 2016-08-04 DIAGNOSIS — IMO0001 Reserved for inherently not codable concepts without codable children: Secondary | ICD-10-CM

## 2016-08-04 DIAGNOSIS — I700 Atherosclerosis of aorta: Secondary | ICD-10-CM | POA: Diagnosis not present

## 2016-08-04 NOTE — Assessment & Plan Note (Signed)
The patient is status post endovascular repair of a ruptured abdominal aortic aneurysm earlier this year. He is doing well and looks great at this point. He has stopped drinking. His duplex today shows a marked improvement in his aneurysm now measuring only 7.11 cm in maximal diameter. 3 months ago on duplex it measured 9.4 cm in maximal diameter. The stent graft is patent and there is no endoleak. We will plan to recheck this in 6 months with duplex or sooner if problems develop in the interim.

## 2016-08-04 NOTE — Assessment & Plan Note (Signed)
lipid control important in reducing the progression of atherosclerotic disease. Continue statin therapy  

## 2016-08-04 NOTE — Assessment & Plan Note (Signed)
blood pressure control important in reducing the progression of atherosclerotic disease and aneurysmal degeneration. On appropriate oral medications.  

## 2016-08-04 NOTE — Progress Notes (Signed)
MRN : [id number]  Larry Buckley is a 75 y.o. (09/24/1941) male who presents with chief complaint of  Chief Complaint  Patient presents with  . Follow-up  .  History of Present Illness: Patient returns today in follow up of His abdominal aortic aneurysm. The patient is status post endovascular repair of a ruptured abdominal aortic aneurysm earlier this year. He is doing well and looks great at this point. He has stopped drinking. His duplex today shows a marked improvement in his aneurysm now measuring only 7.11 cm in maximal diameter. 3 months ago on duplex it measured 9.4 cm in maximal diameter. The stent graft is patent and there is no endoleak. He also had ABIs today which are normal at 1.15 and 1.2 with good wave forms.  Current Outpatient Prescriptions  Medication Sig Dispense Refill  . amoxicillin-clavulanate (AUGMENTIN) 875-125 MG tablet Take 1 tablet by mouth every 12 (twelve) hours. 8 tablet 0  . aspirin EC 81 MG tablet Take 1 tablet (81 mg total) by mouth daily. 150 tablet 2  . docusate sodium (COLACE) 100 MG capsule Take 1 capsule (100 mg total) by mouth daily. 10 capsule 0  . feeding supplement, ENSURE ENLIVE, (ENSURE ENLIVE) LIQD Take 237 mLs by mouth 2 (two) times daily between meals. 60 Bottle 0  . Multiple Vitamin (MULTIVITAMIN WITH MINERALS) TABS tablet Take 1 tablet by mouth daily. 30 tablet 5  . oxyCODONE-acetaminophen (ROXICET) 5-325 MG tablet Take 1 tablet by mouth every 6 (six) hours as needed for severe pain. 12 tablet 0  . QUEtiapine (SEROQUEL) 25 MG tablet Take 1 tablet (25 mg total) by mouth at bedtime. 30 tablet 0   No current facility-administered medications for this visit.     Past Medical History:  Diagnosis Date  . Gout, arthropathy   . Hyperlipidemia   . Hypertension     Past Surgical History:  Procedure Laterality Date  . PERIPHERAL VASCULAR CATHETERIZATION N/A 02/08/2016   Procedure: Endovascular Repair/Stent Graft;  Surgeon: Annice Needy, MD;   Location: [facility];  Service: Cardiovascular;  Laterality: N/A;    Social History Social History  Substance Use Topics  . Smoking status: Former Games developer  . Smokeless tobacco: Not on file  . Alcohol use Yes      Family History Family History  Problem Relation Age of Onset  . Cancer Mother   . Diabetes Mother   . Heart disease Father   . Hypertension Father   . Heart disease Brother      Allergies  Allergen Reactions  . Lotensin [Benazepril Hcl] Swelling     REVIEW OF SYSTEMS (Negative unless checked)  Constitutional: [] Weight loss  [] Fever  [] Chills Cardiac: [] Chest pain   [] Chest pressure   [] Palpitations   [] Shortness of breath when laying flat   [] Shortness of breath at rest   [] Shortness of breath with exertion. Vascular:  [] Pain in legs with walking   [] Pain in legs at rest   [] Pain in legs when laying flat   [] Claudication   [] Pain in feet when walking  [] Pain in feet at rest  [] Pain in feet when laying flat   [] History of DVT   [] Phlebitis   [] Swelling in legs   [] Varicose veins   [] Non-healing ulcers Pulmonary:   [] Uses home oxygen   [] Productive cough   [] Hemoptysis   [] Wheeze  [] COPD   [] Asthma Neurologic:  [] Dizziness  [] Blackouts   [] Seizures   [] History of stroke   [] History of  TIA  [] Aphasia   [] Temporary blindness   [] Dysphagia   [] Weakness or numbness in arms   [] Weakness or numbness in legs Musculoskeletal:  [] Arthritis   [] Joint swelling   [x] Joint pain   [] Low back pain Hematologic:  [] Easy bruising  [] Easy bleeding   [] Hypercoagulable state   [] Anemic   Gastrointestinal:  [] Blood in stool   [] Vomiting blood  [] Gastroesophageal reflux/heartburn   [] Abdominal pain Genitourinary:  [] Chronic kidney disease   [] Difficult urination  [] Frequent urination  [] Burning with urination   [] Hematuria Skin:  [] Rashes   [] Ulcers   [] Wounds Psychological:  [] History of anxiety   []  History of major depression.  Physical Examination  BP 133/66   Pulse 68    Resp 17   Ht 5\' 11"  (1.803 m)   Wt 184 lb (83.5 kg)   BMI 25.66 kg/m  Gen:  WD/WN, NAD Head: [place]/AT, No temporalis wasting. Ear/Nose/Throat: Hearing grossly intact, nares w/o erythema or drainage, trachea midline Eyes: PERRLA, EOMI. Sclera non-icteric Neck: Supple, no nuchal rigidity.  No JVD.  Pulmonary:  Good air movement, no use of accessory muscles.  Cardiac: RRR, normal S1, S2 Vascular:  Vessel Right Left  Radial Palpable Palpable  Ulnar Palpable Palpable  Brachial Palpable Palpable  Carotid Palpable, without bruit Palpable, without bruit  Aorta Not palpable N/A  Femoral Palpable Palpable  Popliteal Palpable Palpable  PT Palpable Palpable  DP Palpable Palpable   Gastrointestinal: soft, non-tender/non-distended. No guarding/reflex.  Musculoskeletal: M/S 5/5 throughout.  No deformity or atrophy. Trace LE edema. Neurologic: CN 2-12 intact. Pain and light touch intact in extremities.  Symmetrical.  Speech is fluent.  Psychiatric: Judgment intact, Mood & affect appropriate for pt's clinical situation. Dermatologic: No rashes or ulcers noted.  No cellulitis or open wounds. Lymph : No Cervical, Axillary, or Inguinal lymphadenopathy.      Labs No results found for this or any previous visit (from the past 2160 hour(s)).  Radiology No results found.    Assessment/Plan  Hypertension blood pressure control important in reducing the progression of atherosclerotic disease and aneurysmal degeneration. On appropriate oral medications.   Hyperlipidemia lipid control important in reducing the progression of atherosclerotic disease. Continue statin therapy   Ruptured abdominal aortic aneurysm (AAA) (HCC) The patient is status post endovascular repair of a ruptured abdominal aortic aneurysm earlier this year. He is doing well and looks great at this point. He has stopped drinking. His duplex today shows a marked improvement in his aneurysm now measuring only 7.11 cm in maximal  diameter. 3 months ago on duplex it measured 9.4 cm in maximal diameter. The stent graft is patent and there is no endoleak. We will plan to recheck this in 6 months with duplex or sooner if problems develop in the interim.    Festus BarrenJason Dew, MD  08/04/2016 10:17 AM    This note was created with Dragon medical transcription system.  Any errors from dictation are purely unintentional

## 2017-02-02 ENCOUNTER — Other Ambulatory Visit (INDEPENDENT_AMBULATORY_CARE_PROVIDER_SITE_OTHER): Payer: Medicare Other

## 2017-02-02 ENCOUNTER — Ambulatory Visit (INDEPENDENT_AMBULATORY_CARE_PROVIDER_SITE_OTHER): Payer: Medicare Other | Admitting: Vascular Surgery

## 2017-04-10 ENCOUNTER — Ambulatory Visit (INDEPENDENT_AMBULATORY_CARE_PROVIDER_SITE_OTHER): Payer: Medicare Other | Admitting: Vascular Surgery

## 2017-04-10 ENCOUNTER — Other Ambulatory Visit (INDEPENDENT_AMBULATORY_CARE_PROVIDER_SITE_OTHER): Payer: Medicare Other

## 2017-05-08 ENCOUNTER — Ambulatory Visit (INDEPENDENT_AMBULATORY_CARE_PROVIDER_SITE_OTHER): Payer: Medicare Other | Admitting: Vascular Surgery

## 2017-05-08 ENCOUNTER — Ambulatory Visit (INDEPENDENT_AMBULATORY_CARE_PROVIDER_SITE_OTHER): Payer: Medicare Other

## 2017-05-08 ENCOUNTER — Encounter (INDEPENDENT_AMBULATORY_CARE_PROVIDER_SITE_OTHER): Payer: Self-pay | Admitting: Vascular Surgery

## 2017-05-08 VITALS — BP 131/69 | HR 56 | Resp 16 | Wt 195.0 lb

## 2017-05-08 DIAGNOSIS — I713 Abdominal aortic aneurysm, ruptured, unspecified: Secondary | ICD-10-CM

## 2017-05-08 DIAGNOSIS — E785 Hyperlipidemia, unspecified: Secondary | ICD-10-CM

## 2017-05-08 DIAGNOSIS — I10 Essential (primary) hypertension: Secondary | ICD-10-CM | POA: Diagnosis not present

## 2017-05-08 NOTE — Assessment & Plan Note (Signed)
lipid control important in reducing the progression of atherosclerotic disease. Continue statin therapy  

## 2017-05-08 NOTE — Assessment & Plan Note (Signed)
His aneurysm duplex today shows a marked decrease in the size of his abdominal aortic aneurysm now measuring 6.1 x 6.35 cm in diameter. At last check, this measured 6.6 x 7.1 cm and this was well over 8 cm at the time of repair. No endoleak is present. He has done quite well after his ruptured aneurysm repair. I will plan to see him back in 1 year with a follow-up duplex or sooner if problems develop in the interim.

## 2017-05-08 NOTE — Patient Instructions (Signed)
Abdominal Aortic Aneurysm Blood pumps away from the heart through tubes (blood vessels) called arteries. Aneurysms are weak or damaged places in the wall of an artery. It bulges out like a balloon. An abdominal aortic aneurysm happens in the main artery of the body (aorta). It can burst or tear, causing bleeding inside the body. This is an emergency. It needs treatment right away. What are the causes? The exact cause is unknown. Things that could cause this problem include:  Fat and other substances building up in the lining of a tube.  Swelling of the walls of a blood vessel.  Certain tissue diseases.  Belly (abdominal) trauma.  An infection in the main artery of the body.  What increases the risk? There are things that make it more likely for you to have an aneurysm. These include:  Being over the age of 77 years old.  Having high blood pressure (hypertension).  Being a male.  Being white.  Being very overweight (obese).  Having a family history of aneurysm.  Using tobacco products.  What are the signs or symptoms? Symptoms depend on the size of the aneurysm and how fast it grows. There may not be symptoms. If symptoms occur, they can include:  Pain (belly, side, lower back, or groin).  Feeling full after eating a small amount of food.  Feeling sick to your stomach (nauseous), throwing up (vomiting), or both.  Feeling a lump in your belly that feels like it is beating (pulsating).  Feeling like you will pass out (faint).  How is this treated?  Medicine to control blood pressure and pain.  Imaging tests to see if the aneurysm gets bigger.  Surgery. How is this prevented? To lessen your chance of getting this condition:  Stop smoking. Stop chewing tobacco.  Limit or avoid alcohol.  Keep your blood pressure, blood sugar, and cholesterol within normal limits.  Eat less salt.  Eat foods low in saturated fats and cholesterol. These are found in animal and  whole dairy products.  Eat more fiber. Fiber is found in whole grains, vegetables, and fruits.  Keep a healthy weight.  Stay active and exercise often.  This information is not intended to replace advice given to you by your health care provider. Make sure you discuss any questions you have with your health care provider. Document Released: 01/27/2013 Document Revised: 03/09/2016 Document Reviewed: 11/01/2012 Elsevier Interactive Patient Education  2017 [employer] Inc.  

## 2017-05-08 NOTE — Progress Notes (Signed)
MRN : [id number]  Larry Buckley is a 76 y.o. (Sep 20, 1941) male who presents with chief complaint of  Chief Complaint  Patient presents with  . ultrasound follow up  .  History of Present Illness: Patient returns today in follow up of AAA. He is about 15 months status post repair of a ruptured abdominal aortic aneurysm with stent graft. He did tremendously well from this and has resumed all normal activities and has no complaints today. His aneurysm duplex today shows a marked decrease in the size of his abdominal aortic aneurysm now measuring 6.1 x 6.35 cm in diameter. At last check, this measured 6.6 x 7.1 cm and this was well over 8 cm at the time of repair. No endoleak is present.  Current Outpatient Prescriptions  Medication Sig Dispense Refill  . amLODipine (NORVASC) 10 MG tablet Take 10 mg by mouth daily.  1  . aspirin EC 81 MG tablet Take 1 tablet (81 mg total) by mouth daily. 150 tablet 2  . atorvastatin (LIPITOR) 20 MG tablet every evening.  1  . amoxicillin-clavulanate (AUGMENTIN) 875-125 MG tablet Take 1 tablet by mouth every 12 (twelve) hours. (Patient not taking: Reported on 05/08/2017) 8 tablet 0  . docusate sodium (COLACE) 100 MG capsule Take 1 capsule (100 mg total) by mouth daily. (Patient not taking: Reported on 05/08/2017) 10 capsule 0  . feeding supplement, ENSURE ENLIVE, (ENSURE ENLIVE) LIQD Take 237 mLs by mouth 2 (two) times daily between meals. (Patient not taking: Reported on 05/08/2017) 60 Bottle 0  . Multiple Vitamin (MULTIVITAMIN WITH MINERALS) TABS tablet Take 1 tablet by mouth daily. (Patient not taking: Reported on 05/08/2017) 30 tablet 5  . oxyCODONE-acetaminophen (ROXICET) 5-325 MG tablet Take 1 tablet by mouth every 6 (six) hours as needed for severe pain. (Patient not taking: Reported on 05/08/2017) 12 tablet 0  . QUEtiapine (SEROQUEL) 25 MG tablet Take 1 tablet (25 mg total) by mouth at bedtime. (Patient not taking: Reported on 05/08/2017) 30 tablet 0   No  current facility-administered medications for this visit.     Past Medical History:  Diagnosis Date  . Gout, arthropathy   . Hyperlipidemia   . Hypertension     Past Surgical History:  Procedure Laterality Date  . PERIPHERAL VASCULAR CATHETERIZATION N/A 02/08/2016   Procedure: Endovascular Repair/Stent Graft;  Surgeon: Annice NeedyJason S Dew, MD;  Location: [facility];  Service: Cardiovascular;  Laterality: N/A;    Social History Social History  Substance Use Topics  . Smoking status: Former Games developermoker  . Smokeless tobacco: Never Used  . Alcohol use Yes     Family History Family History  Problem Relation Age of Onset  . Cancer Mother   . Diabetes Mother   . Heart disease Father   . Hypertension Father   . Heart disease Brother      Allergies  Allergen Reactions  . Lotensin [Benazepril Hcl] Swelling     REVIEW OF SYSTEMS (Negative unless checked)  Constitutional: [] Weight loss  [] Fever  [] Chills Cardiac: [] Chest pain   [] Chest pressure   [] Palpitations   [] Shortness of breath when laying flat   [] Shortness of breath at rest   [] Shortness of breath with exertion. Vascular:  [] Pain in legs with walking   [] Pain in legs at rest   [] Pain in legs when laying flat   [] Claudication   [] Pain in feet when walking  [] Pain in feet at rest  [] Pain in feet when laying flat   [] History  of DVT   [] Phlebitis   [] Swelling in legs   [] Varicose veins   [] Non-healing ulcers Pulmonary:   [] Uses home oxygen   [] Productive cough   [] Hemoptysis   [] Wheeze  [] COPD   [] Asthma Neurologic:  [] Dizziness  [] Blackouts   [] Seizures   [] History of stroke   [] History of TIA  [] Aphasia   [] Temporary blindness   [] Dysphagia   [] Weakness or numbness in arms   [] Weakness or numbness in legs Musculoskeletal:  [] Arthritis   [] Joint swelling   [] Joint pain   [] Low back pain Hematologic:  [] Easy bruising  [] Easy bleeding   [] Hypercoagulable state   [] Anemic   Gastrointestinal:  [] Blood in stool   [] Vomiting blood   [] Gastroesophageal reflux/heartburn   [] Abdominal pain Genitourinary:  [] Chronic kidney disease   [] Difficult urination  [] Frequent urination  [] Burning with urination   [] Hematuria Skin:  [] Rashes   [] Ulcers   [] Wounds Psychological:  [] History of anxiety   []  History of major depression.  Physical Examination  BP 131/69   Pulse (!) 56   Resp 16   Wt 195 lb (88.5 kg)   BMI 27.20 kg/m  Gen:  WD/WN, NAD Head: [place]/AT, No temporalis wasting. Ear/Nose/Throat: Hearing grossly intact, nares w/o erythema or drainage, trachea midline Eyes: Conjunctiva clear. Sclera non-icteric Neck: Supple.  No JVD.  Pulmonary:  Good air movement, no use of accessory muscles.  Cardiac: RRR, normal S1, S2 Vascular:  Vessel Right Left  Radial Palpable Palpable                                   Gastrointestinal: soft, non-tender/non-distended. No increased aortic impulse Musculoskeletal: M/S 5/5 throughout.  No deformity or atrophy.  Neurologic: Sensation grossly intact in extremities.  Symmetrical.  Speech is fluent.  Psychiatric: Judgment intact, Mood & affect appropriate for pt's clinical situation. Dermatologic: No rashes or ulcers noted.  No cellulitis or open wounds.       Labs No results found for this or any previous visit (from the past 2160 hour(s)).  Radiology No results found.    Assessment/Plan  Hyperlipidemia lipid control important in reducing the progression of atherosclerotic disease. Continue statin therapy   Hypertension blood pressure control important in reducing the progression of atherosclerotic disease. On appropriate oral medications.   Ruptured abdominal aortic aneurysm (AAA) (HCC) His aneurysm duplex today shows a marked decrease in the size of his abdominal aortic aneurysm now measuring 6.1 x 6.35 cm in diameter. At last check, this measured 6.6 x 7.1 cm and this was well over 8 cm at the time of repair. No endoleak is present. He has done quite well  after his ruptured aneurysm repair. I will plan to see him back in 1 year with a follow-up duplex or sooner if problems develop in the interim.    Festus Barren, MD  05/08/2017 10:26 AM    This note was created with Dragon medical transcription system.  Any errors from dictation are purely unintentional

## 2017-05-08 NOTE — Assessment & Plan Note (Signed)
blood pressure control important in reducing the progression of atherosclerotic disease. On appropriate oral medications.  

## 2017-12-13 ENCOUNTER — Encounter: Payer: Self-pay | Admitting: Cardiology

## 2018-05-10 ENCOUNTER — Ambulatory Visit (INDEPENDENT_AMBULATORY_CARE_PROVIDER_SITE_OTHER): Payer: Medicare HMO | Admitting: Vascular Surgery

## 2018-05-10 ENCOUNTER — Encounter (INDEPENDENT_AMBULATORY_CARE_PROVIDER_SITE_OTHER): Payer: Self-pay | Admitting: Vascular Surgery

## 2018-05-10 ENCOUNTER — Encounter (INDEPENDENT_AMBULATORY_CARE_PROVIDER_SITE_OTHER): Payer: Self-pay

## 2018-05-10 ENCOUNTER — Ambulatory Visit (INDEPENDENT_AMBULATORY_CARE_PROVIDER_SITE_OTHER): Payer: Medicare HMO

## 2018-05-10 VITALS — BP 151/69 | HR 46 | Resp 13 | Ht 73.5 in | Wt 195.0 lb

## 2018-05-10 DIAGNOSIS — I10 Essential (primary) hypertension: Secondary | ICD-10-CM | POA: Diagnosis not present

## 2018-05-10 DIAGNOSIS — I713 Abdominal aortic aneurysm, ruptured, unspecified: Secondary | ICD-10-CM

## 2018-05-10 DIAGNOSIS — E785 Hyperlipidemia, unspecified: Secondary | ICD-10-CM | POA: Diagnosis not present

## 2018-05-10 NOTE — Assessment & Plan Note (Signed)
His duplex today shows a stable size of his abdominal aortic aneurysm sac measuring about 6.7 cm without an endoleak and the stent graft being widely patent. He is doing well.  We will continue to follow this on an annual basis.

## 2018-05-10 NOTE — Patient Instructions (Signed)
Abdominal Aortic Aneurysm Blood pumps away from the heart through tubes (blood vessels) called arteries. Aneurysms are weak or damaged places in the wall of an artery. It bulges out like a balloon. An abdominal aortic aneurysm happens in the main artery of the body (aorta). It can burst or tear, causing bleeding inside the body. This is an emergency. It needs treatment right away. What are the causes? The exact cause is unknown. Things that could cause this problem include:  Fat and other substances building up in the lining of a tube.  Swelling of the walls of a blood vessel.  Certain tissue diseases.  Belly (abdominal) trauma.  An infection in the main artery of the body.  What increases the risk? There are things that make it more likely for you to have an aneurysm. These include:  Being over the age of 78 years old.  Having high blood pressure (hypertension).  Being a male.  Being white.  Being very overweight (obese).  Having a family history of aneurysm.  Using tobacco products.  What are the signs or symptoms? Symptoms depend on the size of the aneurysm and how fast it grows. There may not be symptoms. If symptoms occur, they can include:  Pain (belly, side, lower back, or groin).  Feeling full after eating a small amount of food.  Feeling sick to your stomach (nauseous), throwing up (vomiting), or both.  Feeling a lump in your belly that feels like it is beating (pulsating).  Feeling like you will pass out (faint).  How is this treated?  Medicine to control blood pressure and pain.  Imaging tests to see if the aneurysm gets bigger.  Surgery. How is this prevented? To lessen your chance of getting this condition:  Stop smoking. Stop chewing tobacco.  Limit or avoid alcohol.  Keep your blood pressure, blood sugar, and cholesterol within normal limits.  Eat less salt.  Eat foods low in saturated fats and cholesterol. These are found in animal and  whole dairy products.  Eat more fiber. Fiber is found in whole grains, vegetables, and fruits.  Keep a healthy weight.  Stay active and exercise often.  This information is not intended to replace advice given to you by your health care provider. Make sure you discuss any questions you have with your health care provider. Document Released: 01/27/2013 Document Revised: 03/09/2016 Document Reviewed: 11/01/2012 Elsevier Interactive Patient Education  2017 [employer]  

## 2018-05-10 NOTE — Progress Notes (Signed)
MRN : [id number]  Larry Buckley is a 77 y.o. (1941-09-21) male who presents with chief complaint of  Chief Complaint  Patient presents with  . Follow-up    1 year abdominal aorta f/u  .  History of Present Illness: Patient returns today in follow up of abdominal aortic aneurysm.  He is 2 to 3 years status post repair of a ruptured abdominal aortic aneurysm.  He has done extremely well following this and has not had any postoperative problems.  He was back to normal activity within a week or 2 of being discharged from the hospital.  He was in the hospital for around a week.  His duplex today shows a stable size of his abdominal aortic aneurysm sac measuring about 6.7 cm without an endoleak and the stent graft being widely patent.  Current Outpatient Medications  Medication Sig Dispense Refill  . amLODipine (NORVASC) 10 MG tablet Take 10 mg by mouth daily.  1  . aspirin EC 81 MG tablet Take 1 tablet (81 mg total) by mouth daily. 150 tablet 2  . atorvastatin (LIPITOR) 20 MG tablet every evening.  1  . feeding supplement, ENSURE ENLIVE, (ENSURE ENLIVE) LIQD Take 237 mLs by mouth 2 (two) times daily between meals. 60 Bottle 0  . lisinopril (PRINIVIL,ZESTRIL) 20 MG tablet TAKE 1 TABLET BY MOUTH EVERY DAY IN THE MORNING  1  . amoxicillin-clavulanate (AUGMENTIN) 875-125 MG tablet Take 1 tablet by mouth every 12 (twelve) hours. (Patient not taking: Reported on 05/08/2017) 8 tablet 0  . docusate sodium (COLACE) 100 MG capsule Take 1 capsule (100 mg total) by mouth daily. (Patient not taking: Reported on 05/08/2017) 10 capsule 0  . Multiple Vitamin (MULTIVITAMIN WITH MINERALS) TABS tablet Take 1 tablet by mouth daily. (Patient not taking: Reported on 05/08/2017) 30 tablet 5  . oxyCODONE-acetaminophen (ROXICET) 5-325 MG tablet Take 1 tablet by mouth every 6 (six) hours as needed for severe pain. (Patient not taking: Reported on 05/08/2017) 12 tablet 0  . QUEtiapine (SEROQUEL) 25 MG tablet Take 1 tablet  (25 mg total) by mouth at bedtime. (Patient not taking: Reported on 05/08/2017) 30 tablet 0   No current facility-administered medications for this visit.     Past Medical History:  Diagnosis Date  . AAA (abdominal aortic aneurysm) (HCC)   . Gout, arthropathy   . Hyperlipidemia   . Hypertension     Past Surgical History:  Procedure Laterality Date  . PERIPHERAL VASCULAR CATHETERIZATION N/A 02/08/2016   Procedure: Endovascular Repair/Stent Graft;  Surgeon: Annice NeedyJason S Davidmichael Zarazua, MD;  Location: [facility];  Service: Cardiovascular;  Laterality: N/A;    Social History  Substance Use Topics  . Smoking status: Former Games developermoker  . Smokeless tobacco: Never Used  . Alcohol use Yes     Family History      Family History  Problem Relation Age of Onset  . Cancer Mother   . Diabetes Mother   . Heart disease Father   . Hypertension Father   . Heart disease Brother          Allergies  Allergen Reactions  . Lotensin [Benazepril Hcl] Swelling     REVIEW OF SYSTEMS (Negative unless checked)  Constitutional: [] Weight loss  [] Fever  [] Chills Cardiac: [] Chest pain   [] Chest pressure   [] Palpitations   [] Shortness of breath when laying flat   [] Shortness of breath at rest   [] Shortness of breath with exertion. Vascular:  [] Pain in legs with walking   []   Pain in legs at rest   [] Pain in legs when laying flat   [] Claudication   [] Pain in feet when walking  [] Pain in feet at rest  [] Pain in feet when laying flat   [] History of DVT   [] Phlebitis   [] Swelling in legs   [] Varicose veins   [] Non-healing ulcers Pulmonary:   [] Uses home oxygen   [] Productive cough   [] Hemoptysis   [] Wheeze  [] COPD   [] Asthma Neurologic:  [] Dizziness  [] Blackouts   [] Seizures   [] History of stroke   [] History of TIA  [] Aphasia   [] Temporary blindness   [] Dysphagia   [] Weakness or numbness in arms   [] Weakness or numbness in legs Musculoskeletal:  [x] Arthritis   [] Joint swelling   [] Joint pain   [] Low  back pain Hematologic:  [] Easy bruising  [] Easy bleeding   [] Hypercoagulable state   [] Anemic   Gastrointestinal:  [] Blood in stool   [] Vomiting blood  [] Gastroesophageal reflux/heartburn   [] Abdominal pain Genitourinary:  [] Chronic kidney disease   [] Difficult urination  [] Frequent urination  [] Burning with urination   [] Hematuria Skin:  [] Rashes   [] Ulcers   [] Wounds Psychological:  [] History of anxiety   []  History of major depression.     Physical Examination  BP (!) 151/69 (BP Location: Right Arm, Patient Position: Sitting)   Pulse (!) 46   Resp 13   Ht 6' 1.5" (1.867 m)   Wt 195 lb (88.5 kg)   BMI 25.38 kg/m  Gen:  WD/WN, NAD.  Appears younger than stated age Head: [place]/AT, No temporalis wasting. Ear/Nose/Throat: Hearing grossly intact, nares w/o erythema or drainage Eyes: Conjunctiva clear. Sclera non-icteric Neck: Supple.  Trachea midline Pulmonary:  Good air movement, no use of accessory muscles.  Cardiac: RRR, no JVD Vascular:  Vessel Right Left  Radial Palpable Palpable                          PT Palpable Palpable  DP Palpable Palpable   Gastrointestinal: soft, non-tender/non-distended. No increased aortic impulse Musculoskeletal: M/S 5/5 throughout.  No deformity or atrophy.  No edema. Neurologic: Sensation grossly intact in extremities.  Symmetrical.  Speech is fluent.  Psychiatric: Judgment intact, Mood & affect appropriate for pt's clinical situation. Dermatologic: No rashes or ulcers noted.  No cellulitis or open wounds.       Labs No results found for this or any previous visit (from the past 2160 hour(s)).  Radiology No results found.  Assessment/Plan Hyperlipidemia lipid control important in reducing the progression of atherosclerotic disease. Continue statin therapy   Hypertension blood pressure control important in reducing the progression of atherosclerotic disease. On appropriate oral medications.   Ruptured abdominal aortic  aneurysm (AAA) (HCC) His duplex today shows a stable size of his abdominal aortic aneurysm sac measuring about 6.7 cm without an endoleak and the stent graft being widely patent. He is doing well.  We will continue to follow this on an annual basis.    Festus Barren, MD  05/10/2018 9:30 AM    This note was created with Dragon medical transcription system.  Any errors from dictation are purely unintentional

## 2019-05-13 ENCOUNTER — Encounter (INDEPENDENT_AMBULATORY_CARE_PROVIDER_SITE_OTHER): Payer: Self-pay | Admitting: Vascular Surgery

## 2019-05-13 ENCOUNTER — Ambulatory Visit (INDEPENDENT_AMBULATORY_CARE_PROVIDER_SITE_OTHER): Payer: Medicare HMO

## 2019-05-13 ENCOUNTER — Ambulatory Visit (INDEPENDENT_AMBULATORY_CARE_PROVIDER_SITE_OTHER): Payer: Medicare HMO | Admitting: Vascular Surgery

## 2019-05-13 VITALS — BP 167/81 | HR 73 | Resp 16 | Ht 73.5 in | Wt 198.0 lb

## 2019-05-13 DIAGNOSIS — E785 Hyperlipidemia, unspecified: Secondary | ICD-10-CM | POA: Diagnosis not present

## 2019-05-13 DIAGNOSIS — I713 Abdominal aortic aneurysm, ruptured, unspecified: Secondary | ICD-10-CM

## 2019-05-13 DIAGNOSIS — Z79899 Other long term (current) drug therapy: Secondary | ICD-10-CM

## 2019-05-13 DIAGNOSIS — Z95828 Presence of other vascular implants and grafts: Secondary | ICD-10-CM

## 2019-05-13 DIAGNOSIS — Z87891 Personal history of nicotine dependence: Secondary | ICD-10-CM

## 2019-05-13 DIAGNOSIS — I10 Essential (primary) hypertension: Secondary | ICD-10-CM | POA: Diagnosis not present

## 2019-05-13 NOTE — Patient Instructions (Signed)
Abdominal Aortic Aneurysm  An aneurysm is a bulge in an artery. It happens when blood pushes against a weakened or damaged artery wall. An abdominal aortic aneurysm (AAA) is an aneurysm that occurs in the lower part of the aorta, which is the main artery of the body. The aorta supplies blood from the heart to the rest of the body. Some aneurysms may not cause symptoms. However, an AAA can cause two serious problems:  It can enlarge and burst (rupture).  It can cause blood to flow between the layers of the wall of the aorta through a tear (aortic dissection). Both of these problems are medical emergencies. They can cause bleeding inside the body. If they are not diagnosed and treated right away, they can be life-threatening. What are the causes? The exact cause of this condition is not known. What increases the risk? The following factors may make you more likely to develop this condition:  Being a male 60 years of age or older.  Being Caucasian.  Using tobacco or having a history of tobacco use.  Having a family history of aneurysms.  Having any of the following conditions: ? Hardening of the arteries (arteriosclerosis). ? Inflammation of the walls of an artery (arteritis). ? Certain genetic conditions. ? Obesity. ? An infection in the wall of the aorta (infectious aortitis) caused by bacteria. ? High cholesterol. ? High blood pressure (hypertension). What are the signs or symptoms? Symptoms of this condition vary depending on the size of the aneurysm and how fast it is growing. Most aneurysms grow slowly and do not cause any symptoms. When symptoms do occur, they may include:  Pain in the abdomen, side, or lower back.  Feeling full after eating only small amounts of food.  Feeling a pulsating lump in the abdomen. Symptoms that the aneurysm has ruptured include:  A sudden onset of severe pain in the abdomen, side, or back.  Nausea or vomiting.  Feeling light-headed or  passing out. How is this diagnosed? This condition may be diagnosed with:  A physical exam to check for throbbing and to listen to blood flow in your abdomen.  Tests, such as: ? Ultrasound. ? X-rays. ? CT scan. ? MRI. ? Tests to check your arteries for damage or blockage (angiogram). Because most unruptured AAAs cause no symptoms, they are often found during exams for other conditions. How is this treated? Treatment for this condition depends on:  The size of the aneurysm.  How fast the aneurysm is growing.  Your age.  Risk factors for rupture. If your aneurysm is smaller than 2 inches (5 cm), your health care provider may manage it by:  Checking (monitoring) it regularly to see if it is getting bigger. Depending on the size of the aneurysm, how fast it is growing, and your other risk factors, you may have an ultrasound to monitor it every 3-6 months, every year, or every few years.  Giving you medicines to control blood pressure, treat pain, or fight infection. If your aneurysm is larger than 2 inches (5 cm), your health care provider may do surgery to repair it. Follow these instructions at home: Lifestyle  Do not use any products that contain nicotine or tobacco, such as cigarettes, e-cigarettes, and chewing tobacco. If you need help quitting, ask your health care provider.  Stay physically active and exercise regularly. Talk with your health care provider about how often you should exercise and which types of exercise are safe for you. Eating and drinking    Eat a heart-healthy diet. This includes plenty of fresh fruits and vegetables, whole grains, low-fat (lean) protein, and low-fat dairy products.  Avoid foods that are high in saturated fat and cholesterol, such as red meat and some dairy products.  Do not drink alcohol if: ? Your health care provider tells you not to drink. ? You are pregnant, may be pregnant, or are planning to become pregnant.  If you drink  alcohol: ? Limit how much you use to:  0-1 drink a day for women.  0-2 drinks a day for men. ? Be aware of how much alcohol is in your drink. In the [place] one drink equals one typical bottle of beer (12 oz), one-half glass of wine (5 oz), or one shot of hard liquor (1 oz). General instructions  Take over-the-counter and prescription medicines only as told by your health care provider.  Keep your blood pressure within a normal range. Check it regularly, and ask your health care provider what your target blood pressure should be.  Have your blood sugar (glucose) level and cholesterol levels checked regularly. Follow your health care provider's instructions on how to keep levels within normal limits.  Avoid heavy lifting and activities that take a lot of effort (are strenuous). Ask your health care provider what activities are safe for you.  Keep all follow-up visits as told by your health care provider. This is important. Contact a health care provider if you:  Have pain in your abdomen, side, or back.  Have a throbbing feeling in your abdomen. Get help right away if you:  Have sudden, severe pain in your abdomen, side, or back.  Experience nausea or vomiting.  Have constipation or problems urinating.  Feel light-headed.  Have a rapid heart rate when you stand.  Have sweaty, clammy skin.  Have shortness of breath.  Have a fever. These symptoms may represent a serious problem that is an emergency. Do not wait to see if the symptoms will go away. Get medical help right away. Call your local emergency services (911 in the [place] Do not drive yourself to the hospital. Summary  An aneurysm is a bulge in an artery. It happens when blood pushes against a weakened or damaged artery wall.  Being older, male, Caucasian, having a history of tobacco use, and a family history of aneurysms can increase the risk.  These problems can cause bleeding inside the body and can be  life-threatening. Get medical help right away. This information is not intended to replace advice given to you by your health care provider. Make sure you discuss any questions you have with your health care provider. Document Released: 07/12/2005 Document Revised: 01/20/2019 Document Reviewed: 05/11/2018 Elsevier Patient Education  2020 [employer] Inc[employer]  

## 2019-05-13 NOTE — Progress Notes (Signed)
MRN : [id number]  Larry BickersWilliam H Buckley is a 78 y.o. (03-05-1941) male who presents with chief complaint of  Chief Complaint  Patient presents with  . Follow-up    ultrasound follow up  .  History of Present Illness: Patient returns today in follow up of his abdominal aortic aneurysm.  He is several years status post repair of a ruptured abdominal aortic aneurysm with stent graft. He is doing well.  He has no complaints.  His stent graft is patent with no endoleak and the aneurysm sac has decreased to 6.5 cm in maximal diameter.  Current Outpatient Medications  Medication Sig Dispense Refill  . amLODipine (NORVASC) 10 MG tablet Take 10 mg by mouth daily.  1  . aspirin EC 81 MG tablet Take 1 tablet (81 mg total) by mouth daily. 150 tablet 2  . atorvastatin (LIPITOR) 20 MG tablet every evening.  1  . lisinopril (PRINIVIL,ZESTRIL) 20 MG tablet TAKE 1 TABLET BY MOUTH EVERY DAY IN THE MORNING  1  . Multiple Vitamin (MULTIVITAMIN WITH MINERALS) TABS tablet Take 1 tablet by mouth daily. 30 tablet 5  . amoxicillin-clavulanate (AUGMENTIN) 875-125 MG tablet Take 1 tablet by mouth every 12 (twelve) hours. (Patient not taking: Reported on 05/08/2017) 8 tablet 0  . docusate sodium (COLACE) 100 MG capsule Take 1 capsule (100 mg total) by mouth daily. (Patient not taking: Reported on 05/08/2017) 10 capsule 0  . feeding supplement, ENSURE ENLIVE, (ENSURE ENLIVE) LIQD Take 237 mLs by mouth 2 (two) times daily between meals. (Patient not taking: Reported on 05/13/2019) 60 Bottle 0  . oxyCODONE-acetaminophen (ROXICET) 5-325 MG tablet Take 1 tablet by mouth every 6 (six) hours as needed for severe pain. (Patient not taking: Reported on 05/08/2017) 12 tablet 0  . QUEtiapine (SEROQUEL) 25 MG tablet Take 1 tablet (25 mg total) by mouth at bedtime. (Patient not taking: Reported on 05/08/2017) 30 tablet 0   No current facility-administered medications for this visit.     Past Medical History:  Diagnosis Date  . AAA  (abdominal aortic aneurysm) (HCC)   . Gout, arthropathy   . Hyperlipidemia   . Hypertension     Past Surgical History:  Procedure Laterality Date  . PERIPHERAL VASCULAR CATHETERIZATION N/A 02/08/2016   Procedure: Endovascular Repair/Stent Graft;  Surgeon: Annice NeedyJason S Dew, MD;  Location: [facility];  Service: Cardiovascular;  Laterality: N/A;    Social History Social History   Tobacco Use  . Smoking status: Former Games developermoker  . Smokeless tobacco: Never Used  Substance Use Topics  . Alcohol use: Yes  . Drug use: No     Family History Family History  Problem Relation Age of Onset  . Cancer Mother   . Diabetes Mother   . Heart disease Father   . Hypertension Father   . Heart disease Brother      Allergies  Allergen Reactions  . Lotensin [Benazepril Hcl] Swelling     REVIEW OF SYSTEMS (Negative unless checked)  Constitutional: [] Weight loss  [] Fever  [] Chills Cardiac: [] Chest pain   [] Chest pressure   [] Palpitations   [] Shortness of breath when laying flat   [] Shortness of breath at rest   [] Shortness of breath with exertion. Vascular:  [] Pain in legs with walking   [] Pain in legs at rest   [] Pain in legs when laying flat   [] Claudication   [] Pain in feet when walking  [] Pain in feet at rest  [] Pain in feet when laying flat   []   History of DVT   [] Phlebitis   [] Swelling in legs   [] Varicose veins   [] Non-healing ulcers Pulmonary:   [] Uses home oxygen   [] Productive cough   [] Hemoptysis   [] Wheeze  [] COPD   [] Asthma Neurologic:  [] Dizziness  [] Blackouts   [] Seizures   [] History of stroke   [] History of TIA  [] Aphasia   [] Temporary blindness   [] Dysphagia   [] Weakness or numbness in arms   [] Weakness or numbness in legs Musculoskeletal:  [x] Arthritis   [] Joint swelling   [] Joint pain   [] Low back pain Hematologic:  [] Easy bruising  [] Easy bleeding   [] Hypercoagulable state   [] Anemic   Gastrointestinal:  [] Blood in stool   [] Vomiting blood  [x] Gastroesophageal  reflux/heartburn   [] Abdominal pain Genitourinary:  [] Chronic kidney disease   [] Difficult urination  [] Frequent urination  [] Burning with urination   [] Hematuria Skin:  [] Rashes   [] Ulcers   [] Wounds Psychological:  [] History of anxiety   []  History of major depression.  Physical Examination  BP (!) 167/81 (BP Location: Right Arm)   Pulse 73   Resp 16   Ht 6' 1.5" (1.867 m)   Wt 198 lb (89.8 kg)   BMI 25.77 kg/m  Gen:  WD/WN, NAD. Appears younger than stated age. Head: [place]/AT, No temporalis wasting. Ear/Nose/Throat: Hearing grossly intact, nares w/o erythema or drainage Eyes: Conjunctiva clear. Sclera non-icteric Neck: Supple.  Trachea midline Pulmonary:  Good air movement, no use of accessory muscles.  Cardiac: RRR, no JVD Vascular:  Vessel Right Left  Radial Palpable Palpable                          PT Palpable Palpable  DP Palpable Palpable   Gastrointestinal: soft, non-tender/non-distended. No guarding/reflex.  Musculoskeletal: M/S 5/5 throughout.  No deformity or atrophy. No edema. Neurologic: Sensation grossly intact in extremities.  Symmetrical.  Speech is fluent.  Psychiatric: Judgment intact, Mood & affect appropriate for pt's clinical situation. Dermatologic: No rashes or ulcers noted.  No cellulitis or open wounds.       Labs No results found for this or any previous visit (from the past 2160 hour(s)).  Radiology No results found.  Assessment/Plan Hyperlipidemia lipid control important in reducing the progression of atherosclerotic disease. Continue statin therapy   Hypertension blood pressure control important in reducing the progression of atherosclerotic disease. On appropriate oral medications.  Ruptured abdominal aortic aneurysm (AAA) (HCC) His stent graft is patent with no endoleak and the aneurysm sac has decreased to 6.5 cm in maximal diameter. He is doing well. Continue yearly duplex surveillance.    Leotis Pain, MD  05/13/2019  9:25 AM    This note was created with Dragon medical transcription system.  Any errors from dictation are purely unintentional

## 2019-05-13 NOTE — Assessment & Plan Note (Signed)
His stent graft is patent with no endoleak and the aneurysm sac has decreased to 6.5 cm in maximal diameter. He is doing well. Continue yearly duplex surveillance.

## 2020-05-14 ENCOUNTER — Ambulatory Visit (INDEPENDENT_AMBULATORY_CARE_PROVIDER_SITE_OTHER): Payer: Medicare HMO | Admitting: Vascular Surgery

## 2020-05-14 ENCOUNTER — Other Ambulatory Visit (INDEPENDENT_AMBULATORY_CARE_PROVIDER_SITE_OTHER): Payer: Medicare HMO

## 2020-05-14 ENCOUNTER — Other Ambulatory Visit (HOSPITAL_COMMUNITY): Payer: Self-pay | Admitting: Neurology

## 2020-05-14 ENCOUNTER — Other Ambulatory Visit: Payer: Self-pay | Admitting: Neurology

## 2020-05-14 ENCOUNTER — Encounter (INDEPENDENT_AMBULATORY_CARE_PROVIDER_SITE_OTHER): Payer: Self-pay

## 2020-05-14 DIAGNOSIS — R413 Other amnesia: Secondary | ICD-10-CM

## 2020-05-21 DIAGNOSIS — F0390 Unspecified dementia without behavioral disturbance: Secondary | ICD-10-CM

## 2020-05-21 HISTORY — DX: Unspecified dementia, unspecified severity, without behavioral disturbance, psychotic disturbance, mood disturbance, and anxiety: F03.90

## 2020-05-27 ENCOUNTER — Encounter (INDEPENDENT_AMBULATORY_CARE_PROVIDER_SITE_OTHER): Payer: Self-pay | Admitting: Nurse Practitioner

## 2020-05-27 ENCOUNTER — Ambulatory Visit (INDEPENDENT_AMBULATORY_CARE_PROVIDER_SITE_OTHER): Payer: Medicare HMO

## 2020-05-27 ENCOUNTER — Ambulatory Visit (INDEPENDENT_AMBULATORY_CARE_PROVIDER_SITE_OTHER): Payer: Medicare HMO | Admitting: Nurse Practitioner

## 2020-05-27 ENCOUNTER — Other Ambulatory Visit: Payer: Self-pay

## 2020-05-27 VITALS — BP 139/65 | HR 58 | Ht 74.0 in | Wt 185.0 lb

## 2020-05-27 DIAGNOSIS — I10 Essential (primary) hypertension: Secondary | ICD-10-CM

## 2020-05-27 DIAGNOSIS — I713 Abdominal aortic aneurysm, ruptured, unspecified: Secondary | ICD-10-CM

## 2020-05-27 DIAGNOSIS — E785 Hyperlipidemia, unspecified: Secondary | ICD-10-CM

## 2020-05-27 NOTE — Progress Notes (Signed)
Subjective:    Patient ID: Larry Buckley, male    DOB: [birth year], 79 y.o.   MRN: [id number] Chief Complaint  Patient presents with  . Follow-up    1 year eval     The patient returns to the office for surveillance of an abdominal aortic aneurysm status post stent graft placement on 02/08/2016.   Patient denies abdominal pain or back pain, no other abdominal complaints. No groin related complaints. No symptoms consistent with distal embolization No changes in claudication distance.   There have been no interval changes in his overall healthcare since his last visit.   Patient denies amaurosis fugax or TIA symptoms. There is no history of claudication or rest pain symptoms of the lower extremities. The patient denies angina or shortness of breath.   Duplex US of the aorta and iliac arteries shows a 6.5 AAA sac with no endoleak, no change in the sac compared to the previous study.   Review of Systems  Respiratory: Negative for shortness of breath.   Cardiovascular: Negative for chest pain and leg swelling.  All other systems reviewed and are negative.      Objective:   Physical Exam Vitals reviewed.  Cardiovascular:     Rate and Rhythm: Normal rate and regular rhythm.     Pulses: Normal pulses.     Heart sounds: Normal heart sounds.  Pulmonary:     Effort: Pulmonary effort is normal.     Breath sounds: Normal breath sounds.  Musculoskeletal:        General: Normal range of motion.  Neurological:     Mental Status: He is alert and oriented to person, place, and time.  Psychiatric:        Mood and Affect: Mood normal.        Behavior: Behavior normal.        Thought Content: Thought content normal.        Judgment: Judgment normal.     BP 139/65   Pulse (!) 58   Ht 6\' 2"  (1.88 m)   Wt 185 lb (83.9 kg)   BMI 23.75 kg/m   Past Medical History:  Diagnosis Date  . AAA (abdominal aortic aneurysm) (HCC)   . Dementia (HCC) 05/21/2020  . Gout, arthropathy   .  Hyperlipidemia   . Hypertension     Social History   Socioeconomic History  . Marital status: Married    Spouse name: Not on file  . Number of children: Not on file  . Years of education: Not on file  . Highest education level: Not on file  Occupational History  . Not on file  Tobacco Use  . Smoking status: Former 07/21/2020  . Smokeless tobacco: Never Used  Substance and Sexual Activity  . Alcohol use: Yes  . Drug use: No  . Sexual activity: Not on file  Other Topics Concern  . Not on file  Social History Narrative  . Not on file   Social Determinants of Health   Financial Resource Strain:   . Difficulty of Paying Living Expenses:   Food Insecurity:   . Worried About Games developer in the Last Year:   . Programme researcher, broadcasting/film/video in the Last Year:   Transportation Needs:   . Barista (Medical):   Freight forwarder Lack of Transportation (Non-Medical):   Physical Activity:   . Days of Exercise per Week:   . Minutes of Exercise per Session:   Stress:   . Feeling  of Stress :   Social Connections:   . Frequency of Communication with Friends and Family:   . Frequency of Social Gatherings with Friends and Family:   . Attends Religious Services:   . Active Member of Clubs or Organizations:   . Attends Banker Meetings:   Marland Kitchen Marital Status:   Intimate Partner Violence:   . Fear of Current or Ex-Partner:   . Emotionally Abused:   Marland Kitchen Physically Abused:   . Sexually Abused:     Past Surgical History:  Procedure Laterality Date  . PERIPHERAL VASCULAR CATHETERIZATION N/A 02/08/2016   Procedure: Endovascular Repair/Stent Graft;  Surgeon: Annice Needy, MD;  Location: [facility] INVASIVE CV LAB;  Service: Cardiovascular;  Laterality: N/A;    Family History  Problem Relation Age of Onset  . Cancer Mother   . Diabetes Mother   . Heart disease Father   . Hypertension Father   . Heart disease Brother     Allergies  Allergen Reactions  . Lotensin [Benazepril Hcl] Swelling        Assessment & Plan:   1. Ruptured abdominal aortic aneurysm (AAA) (HCC) Recommend: Patient is status post successful endovascular repair of the AAA.   No further intervention is required at this time.   No endoleak is detected and the aneurysm sac is stable.  The patient will continue antiplatelet therapy as prescribed as well as aggressive management of hyperlipidemia. Exercise is again strongly encouraged.   However, endografts require continued surveillance with ultrasound or CT scan. This is mandatory to detect any changes that allow repressurization of the aneurysm sac.  The patient is informed that this would be asymptomatic.  The patient is reminded that lifelong routine surveillance is a necessity with an endograft. Patient will continue to follow-up at 12 month intervals with ultrasound of the aorta.  2. Hyperlipidemia, unspecified hyperlipidemia type Continue statin as ordered and reviewed, no changes at this time   3. Essential hypertension Continue antihypertensive medications as already ordered, these medications have been reviewed and there are no changes at this time.    Current Outpatient Medications on File Prior to Visit  Medication Sig Dispense Refill  . amLODipine (NORVASC) 10 MG tablet Take 10 mg by mouth daily.  1  . amoxicillin-clavulanate (AUGMENTIN) 875-125 MG tablet Take 1 tablet by mouth every 12 (twelve) hours. 8 tablet 0  . aspirin EC 81 MG tablet Take 1 tablet (81 mg total) by mouth daily. 150 tablet 2  . atorvastatin (LIPITOR) 20 MG tablet Take by mouth.    . feeding supplement, ENSURE ENLIVE, (ENSURE ENLIVE) LIQD Take 237 mLs by mouth 2 (two) times daily between meals. 60 Bottle 0  . atorvastatin (LIPITOR) 20 MG tablet every evening. (Patient not taking: Reported on 05/27/2020)  1  . docusate sodium (COLACE) 100 MG capsule Take 1 capsule (100 mg total) by mouth daily. (Patient not taking: Reported on 05/08/2017) 10 capsule 0  . lisinopril  (PRINIVIL,ZESTRIL) 20 MG tablet TAKE 1 TABLET BY MOUTH EVERY DAY IN THE MORNING (Patient not taking: Reported on 05/27/2020)  1  . Multiple Vitamin (MULTIVITAMIN WITH MINERALS) TABS tablet Take 1 tablet by mouth daily. (Patient not taking: Reported on 05/27/2020) 30 tablet 5  . oxyCODONE-acetaminophen (ROXICET) 5-325 MG tablet Take 1 tablet by mouth every 6 (six) hours as needed for severe pain. (Patient not taking: Reported on 05/08/2017) 12 tablet 0  . QUEtiapine (SEROQUEL) 25 MG tablet Take 1 tablet (25 mg total) by mouth at bedtime. (  Patient not taking: Reported on 05/08/2017) 30 tablet 0   No current facility-administered medications on file prior to visit.    There are no Patient Instructions on file for this visit. No follow-ups on file.   Georgiana Spinner, NP

## 2020-05-30 ENCOUNTER — Ambulatory Visit: Payer: Medicare HMO

## 2020-06-13 ENCOUNTER — Ambulatory Visit
Admission: RE | Admit: 2020-06-13 | Discharge: 2020-06-13 | Disposition: A | Discharge status: Home / Self Care | Payer: Medicare HMO | Source: Ambulatory Visit | Attending: Neurology | Admitting: Neurology

## 2020-06-13 ENCOUNTER — Other Ambulatory Visit: Payer: Self-pay

## 2020-06-13 DIAGNOSIS — R413 Other amnesia: Secondary | ICD-10-CM | POA: Diagnosis present

## 2020-06-13 IMAGING — MR MR HEAD W/O CM
11 series · 48 of 48 positions shown · non-contrast
Comparison: None.

CLINICAL DATA: Progressive memory loss

EXAM:
MRI HEAD WITHOUT CONTRAST
TECHNIQUE: Multiplanar, multiecho pulse sequences of the brain and surrounding
structures were obtained without intravenous contrast.

[Series 5: ax dwi_tracew · axial · 3.0mm · 0.71mm/px · z∈[-64,+98]mm · 5 of 56 slices shown]
[im 1/56]
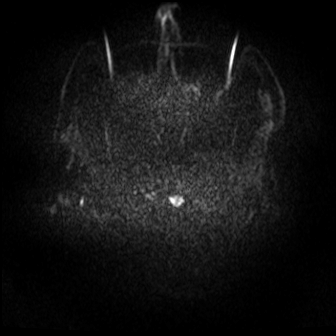
[im 14/56]
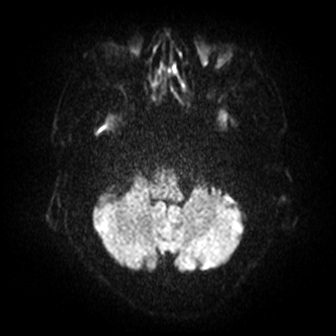
[im 28/56]
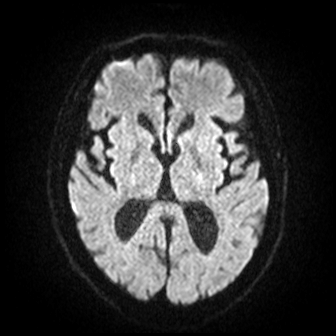
[im 42/56]
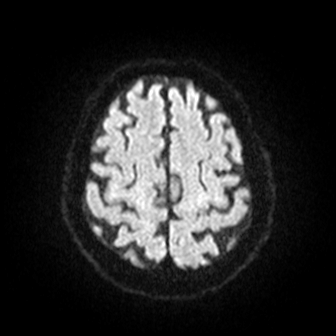
[im 56/56]
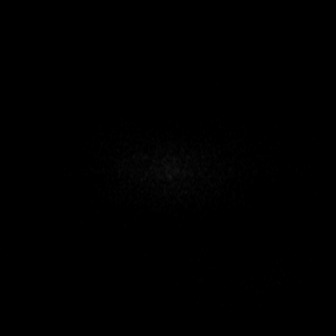

[Series 6: ax dwi_adc · axial · 3.0mm · 0.71mm/px · z∈[-64,+95]mm · 5 of 55 slices shown]
[im 1/55]
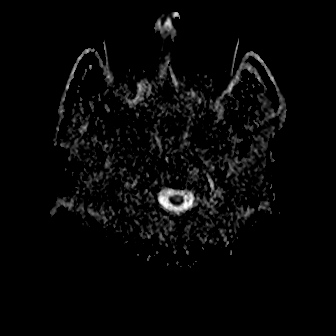
[im 14/55]
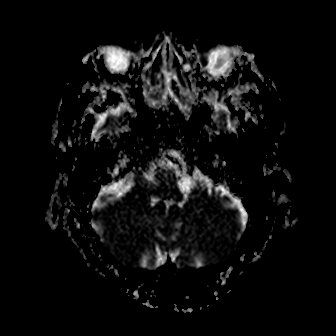
[im 28/55]
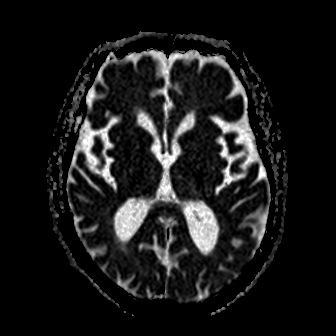
[im 41/55]
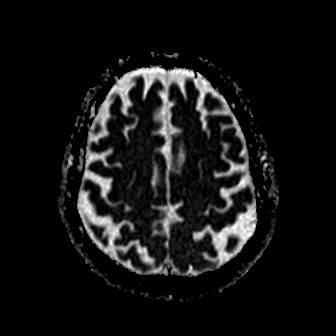
[im 55/55]
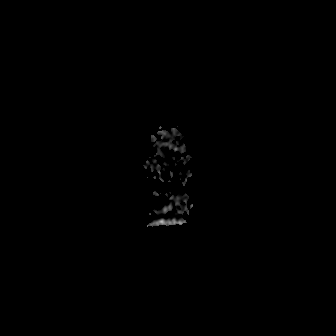

[Series 7: cor dwi_tracew · coronal · 5.0mm · 0.68mm/px · 3 of 42 slices shown]
[im 1/42]
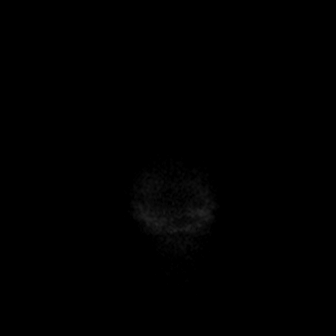
[im 21/42]
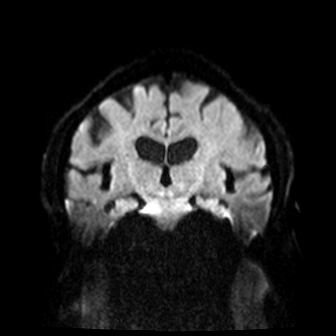
[im 42/42]
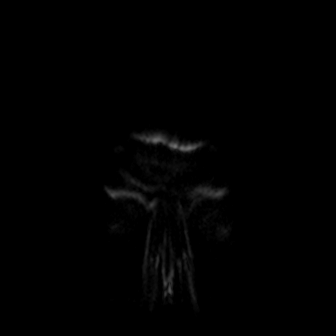

[Series 8: cor dwi_adc · coronal · 5.0mm · 0.68mm/px · 3 of 42 slices shown]
[im 1/42]
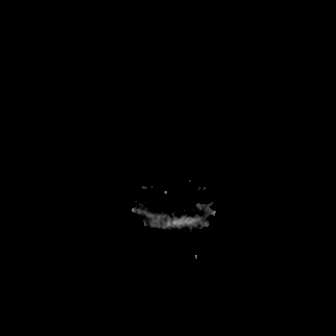
[im 21/42]
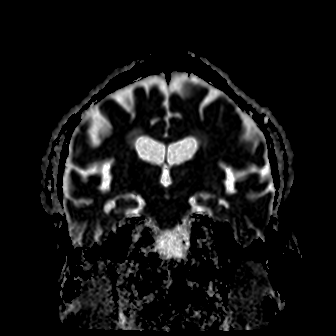
[im 42/42]
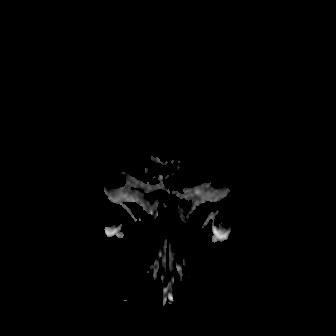

[Series 9: T1 · sagittal · 5.0mm · 0.94mm/px · 2 of 25 slices shown (1 of 2)]
[im 1/25]
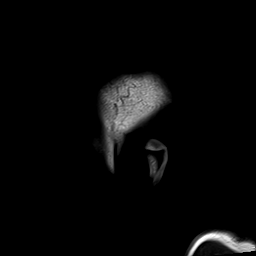
[im 25/25]
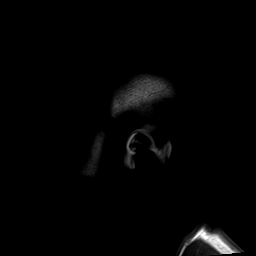

[Series 10: T2 · axial · 5.0mm · 0.55mm/px · z∈[-59,+95]mm · 2 of 27 slices shown (1 of 2)]
[im 1/27]
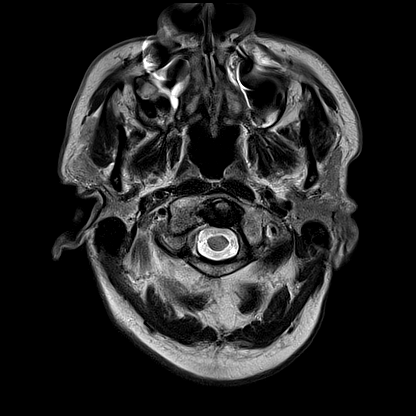
[im 27/27]
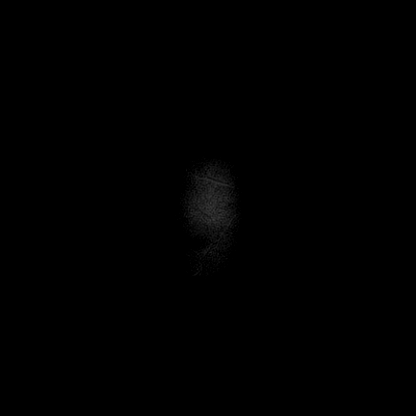

[Series 12: pha_images · axial · 3.0mm · 0.90mm/px · z∈[-66,+103]mm · 4 of 57 slices shown]
[im 1/57]
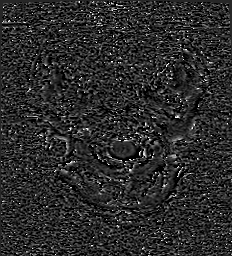
[im 19/57]
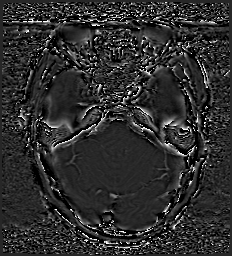
[im 38/57]
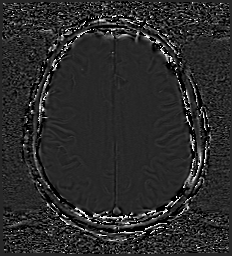
[im 57/57]
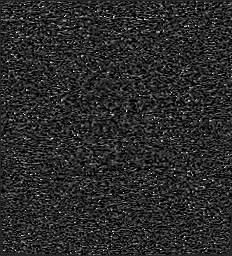

[Series 13: swi_images · axial · 3.0mm · 0.90mm/px · z∈[-69,+106]mm · 4 of 60 slices shown]
[im 1/60]
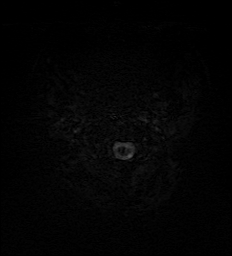
[im 20/60]
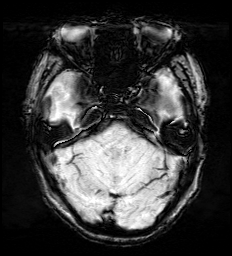
[im 40/60]
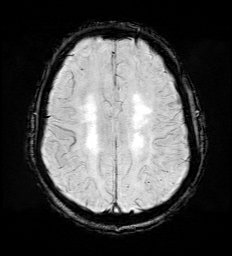
[im 60/60]
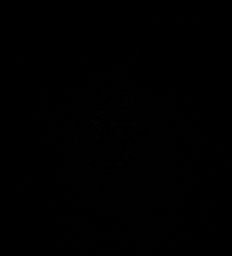

[Series 15: FLAIR · axial · 3.0mm · 0.55mm/px · z∈[-62,+98]mm · 4 of 55 slices shown]
[im 1/55]
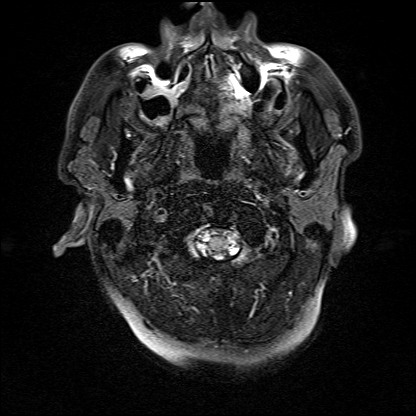
[im 19/55]
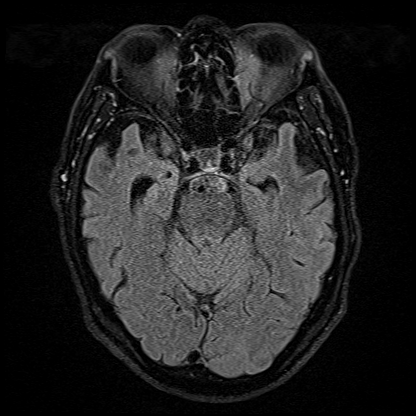
[im 37/55]
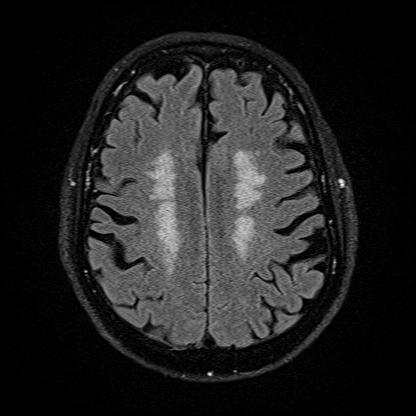
[im 55/55]
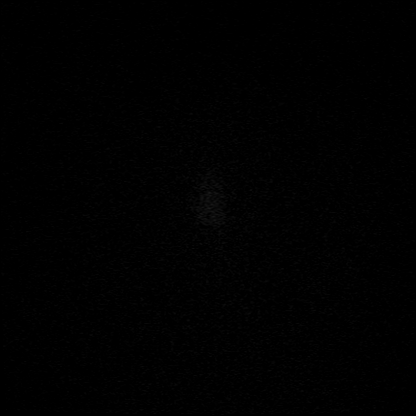

[Series 16: T1 · axial · 1.0mm · 0.98mm/px · z∈[-75,+112]mm · 14 of 190 slices shown (2 of 2)]
[im 1/190]
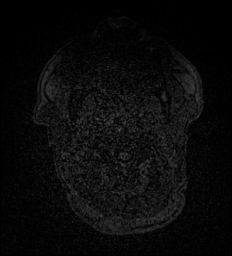
[im 15/190]
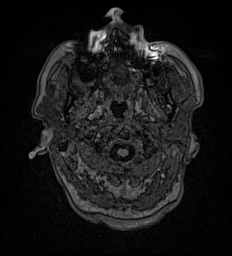
[im 30/190]
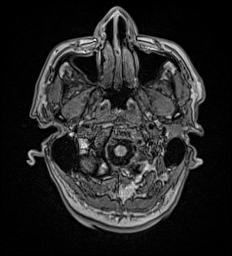
[im 44/190]
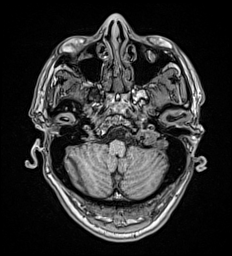
[im 59/190]
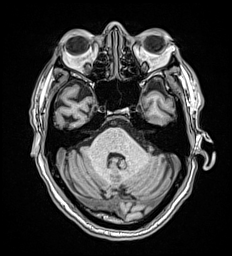
[im 73/190]
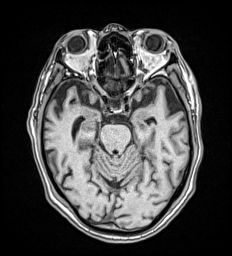
[im 88/190]
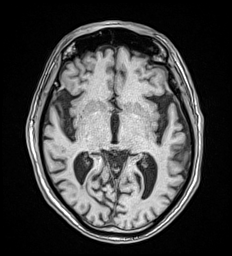
[im 102/190]
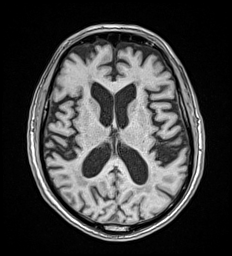
[im 117/190]
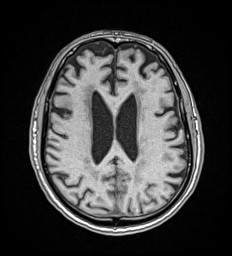
[im 131/190]
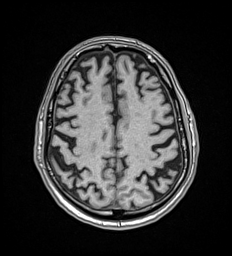
[im 146/190]
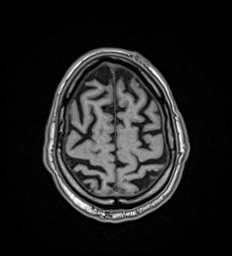
[im 160/190]
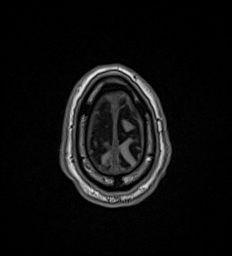
[im 175/190]
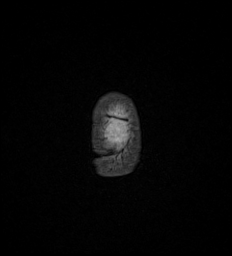
[im 190/190]
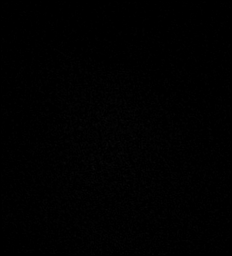

[Series 17: T2 · coronal · 5.0mm · 0.57mm/px · 2 of 33 slices shown (2 of 2)]
[im 1/33]
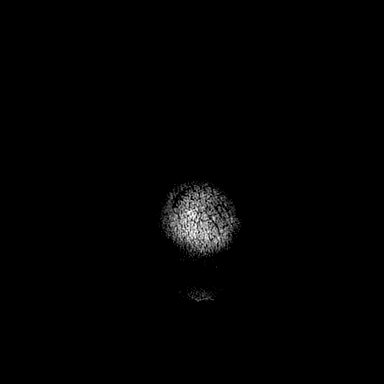
[im 33/33]
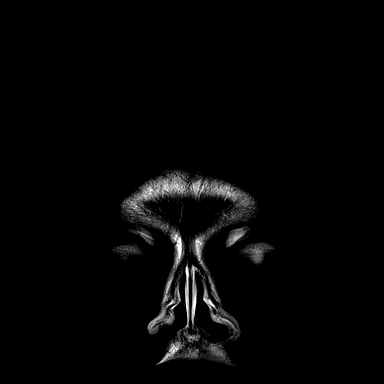

[48 of 48 positions shown; findings below may reference images not displayed]

FINDINGS: Brain: There is no acute infarction or intracranial hemorrhage.
There is no intracranial mass, mass effect, or edema. There is no
hydrocephalus or extra-axial fluid collection.

Patchy and confluent areas of T2 hyperintensity in the
supratentorial much greater than pontine white matter are
nonspecific but probably reflect moderate chronic microvascular
ischemic changes. There are numerous foci of susceptibility
hypointensity in the right occipital and temporal lobes most
compatible with chronic microhemorrhages. Prominence of the
ventricles and sulci reflects generalized parenchymal volume loss.
Qualitatively, there is no disproportionate hippocampal volume loss.

Vascular: Major vessel flow voids at the skull base are preserved.

Skull and upper cervical spine: Normal marrow signal is preserved.

Sinuses/Orbits: Paranasal sinuses are aerated. Orbits are
unremarkable.

Other: Sella is unremarkable.  Mastoid air cells are clear.
IMPRESSION: No intracranial mass.

Moderate chronic microvascular ischemic changes.

Generalized parenchymal volume loss.

Numerous chronic microhemorrhages in the right temporal and
occipital lobes. No associated gliosis to suggest prior ischemia.
Localization to this region is of unclear etiology but could reflect
amyloid angiopathy.

## 2021-04-21 ENCOUNTER — Ambulatory Visit: Payer: BC Managed Care – PPO | Admitting: Nurse Practitioner

## 2021-05-18 ENCOUNTER — Other Ambulatory Visit (INDEPENDENT_AMBULATORY_CARE_PROVIDER_SITE_OTHER): Payer: Self-pay | Admitting: Nurse Practitioner

## 2021-05-18 DIAGNOSIS — I714 Abdominal aortic aneurysm, without rupture, unspecified: Secondary | ICD-10-CM

## 2021-05-19 DIAGNOSIS — Z20822 Contact with and (suspected) exposure to covid-19: Secondary | ICD-10-CM | POA: Diagnosis not present

## 2021-05-19 DIAGNOSIS — Z03818 Encounter for observation for suspected exposure to other biological agents ruled out: Secondary | ICD-10-CM | POA: Diagnosis not present

## 2021-05-24 ENCOUNTER — Encounter (INDEPENDENT_AMBULATORY_CARE_PROVIDER_SITE_OTHER): Payer: Self-pay | Admitting: Vascular Surgery

## 2021-05-24 ENCOUNTER — Ambulatory Visit (INDEPENDENT_AMBULATORY_CARE_PROVIDER_SITE_OTHER): Payer: Medicare HMO | Admitting: Vascular Surgery

## 2021-05-24 ENCOUNTER — Other Ambulatory Visit (INDEPENDENT_AMBULATORY_CARE_PROVIDER_SITE_OTHER): Payer: Medicare HMO

## 2021-05-25 ENCOUNTER — Ambulatory Visit: Payer: Self-pay | Admitting: Nurse Practitioner

## 2021-07-11 ENCOUNTER — Encounter: Payer: Self-pay | Admitting: Nurse Practitioner

## 2021-07-11 ENCOUNTER — Other Ambulatory Visit: Payer: Self-pay

## 2021-07-11 ENCOUNTER — Ambulatory Visit (INDEPENDENT_AMBULATORY_CARE_PROVIDER_SITE_OTHER): Payer: Medicare HMO | Admitting: Nurse Practitioner

## 2021-07-11 ENCOUNTER — Telehealth: Payer: Self-pay

## 2021-07-11 VITALS — BP 148/80 | HR 69 | Temp 100.6°F | Ht 70.0 in | Wt 180.6 lb

## 2021-07-11 DIAGNOSIS — F0280 Dementia in other diseases classified elsewhere without behavioral disturbance: Secondary | ICD-10-CM | POA: Diagnosis not present

## 2021-07-11 DIAGNOSIS — G301 Alzheimer's disease with late onset: Secondary | ICD-10-CM | POA: Diagnosis not present

## 2021-07-11 DIAGNOSIS — R509 Fever, unspecified: Secondary | ICD-10-CM | POA: Insufficient documentation

## 2021-07-11 DIAGNOSIS — I10 Essential (primary) hypertension: Secondary | ICD-10-CM | POA: Diagnosis not present

## 2021-07-11 DIAGNOSIS — E538 Deficiency of other specified B group vitamins: Secondary | ICD-10-CM | POA: Diagnosis not present

## 2021-07-11 DIAGNOSIS — E782 Mixed hyperlipidemia: Secondary | ICD-10-CM | POA: Diagnosis not present

## 2021-07-11 DIAGNOSIS — G309 Alzheimer's disease, unspecified: Secondary | ICD-10-CM | POA: Insufficient documentation

## 2021-07-11 DIAGNOSIS — Z7689 Persons encountering health services in other specified circumstances: Secondary | ICD-10-CM

## 2021-07-11 DIAGNOSIS — F0150 Vascular dementia without behavioral disturbance: Secondary | ICD-10-CM | POA: Insufficient documentation

## 2021-07-11 LAB — VERITOR FLU A/B WAIVED
Influenza A: NEGATIVE
Influenza B: NEGATIVE

## 2021-07-11 MED ORDER — ASPIRIN EC 81 MG PO TBEC: 81.0000 mg | DELAYED_RELEASE_TABLET | Freq: Every day | ORAL | 4 refills | Status: DC

## 2021-07-11 MED ORDER — AMLODIPINE BESYLATE 10 MG PO TABS: 10.0000 mg | ORAL_TABLET | Freq: Every day | ORAL | 4 refills | Status: DC

## 2021-07-11 MED ORDER — ATORVASTATIN CALCIUM 20 MG PO TABS: 20.0000 mg | ORAL_TABLET | Freq: Every day | ORAL | 4 refills | Status: DC

## 2021-07-11 NOTE — Assessment & Plan Note (Signed)
Chronic, progressive, has not returned to neurology as recommended.  Was to have 6 month follow-up.  No current memory medications, may benefit from this in future.  Have recommended wife call and schedule follow-up with neurology, she agrees with this plan -- reports she was not aware follow-up needed as no one scheduled it.  Check B12 level today.  Lots of education needed on disease process -- have recommend patient not drive vehicle at anytime, keys should be hidden, as concern for patient getting lost or in accident.  Will have palliative team and CCM involved, as major education needed.  Return in 4 weeks.

## 2021-07-11 NOTE — Patient Instructions (Addendum)
Call [facility] neurology -- they wanted a 6 month follow-up == [phone]  Restart Amlodipine, Aspirin, and Atorvastatin daily.   Dementia Caregiver Guide Dementia is a term used to describe a number of symptoms that affect memory and thinking. The most common symptoms include: Memory loss. Trouble with language and communication. Trouble concentrating. Poor judgment and problems with reasoning. Wandering from home or public places. Extreme anxiety or depression. Being suspicious or having angry outbursts and accusations. Child-like behavior and language. Dementia can be frightening and confusing. And taking care of someone with dementia can be challenging. This guide provides tips to help you when providing care for a person with dementia. How to help manage lifestyle changes Dementia usually gets worse slowly over time. In the early stages, people with dementia can stay independent and safe with some help. In later stages, they need help with daily tasks such as dressing, grooming, and using the bathroom. There are actions you can take to help a person manage his or her life while living with this condition. Communicating When the person is talking or seems frustrated, make eye contact and hold the person's hand. Ask specific questions that need yes or no answers. Use simple words, short sentences, and a calm voice. Only give one direction at a time. When offering choices, limit the person to just one or two. Avoid correcting the person in a negative way. If the person is struggling to find the right words, gently try to help him or her. Preventing injury  Keep floors clear of clutter. Remove rugs, magazine racks, and floor lamps. Keep hallways well lit, especially at night. Put a handrail and nonslip mat in the bathtub or shower. Put childproof locks on cabinets that contain dangerous items, such as medicines, alcohol, guns, toxic cleaning items, sharp tools or utensils,  matches, and lighters. For doors to the outside of the house, put the locks in places where the person cannot see or reach them easily. This will help ensure that the person does not wander out of the house and get lost. Be prepared for emergencies. Keep a list of emergency phone numbers and addresses in a convenient area. Remove car keys and lock garage doors so that the person does not try to get in the car and drive. Have the person wear a bracelet that tracks locations and identifies the person as having memory problems. This should be worn at all times for safety. Helping with daily life  Keep the person on track with his or her routine. Try to identify areas where the person may need help. Be supportive, patient, calm, and encouraging. Gently remind the person that adjusting to changes takes time. Help with the tasks that the person has asked for help with. Keep the person involved in daily tasks and decisions as much as possible. Encourage conversation, but try not to get frustrated if the person struggles to find words or does not seem to appreciate your help. How to recognize stress Look for signs of stress in yourself and in the person you are caring for. If you notice signs of stress, take steps to manage it. Symptoms of stress include: Feeling anxious, irritable, frustrated, or angry. Denying that the person has dementia or that his or her symptoms will not improve. Feeling depressed, hopeless, or unappreciated. Difficulty sleeping. Difficulty concentrating. Developing stress-related health problems. Feeling like you have too little time for your own life. Follow these instructions at home: Take care of your health Make sure that  you and the person you are caring for: Get regular sleep. Exercise regularly. Eat regular, nutritious meals. Take over-the-counter and prescription medicines only as told by your health care providers. Drink enough fluid to keep your urine pale  yellow. Attend all scheduled health care appointments.  General instructions Join a support group with others who are caregivers. Ask about respite care resources. Respite care can provide short-term care for the person so that you can have a regular break from the stress of caregiving. Consider any safety risks and take steps to avoid them. Organize medicines in a pill box for each day of the week. Create a plan to handle any legal or financial matters. Get legal or financial advice if needed. Keep a calendar in a central location to remind the person of appointments or other activities. Where to find support: Many individuals and organizations offer support. These include: Support groups for people with dementia. Support groups for caregivers. Counselors or therapists. Home health care services. Adult day care centers. Where to find more information Centers for Disease Control and Prevention: FootballExhibition.com.br Alzheimer's Association: LimitLaws.hu Family Caregiver Alliance: www.caregiver.org Alzheimer's Foundation of [place]: www.alzfdn.org Contact a health care provider if: The person's health is rapidly getting worse. You are no longer able to care for the person. Caring for the person is affecting your physical and emotional health. You are feeling depressed or anxious about caring for the person. Get help right away if: The person threatens himself or herself, you, or anyone else. You feel depressed or sad, or feel that you want to harm yourself. If you ever feel like your loved one may hurt himself or herself or others, or if he or she shares thoughts about taking his or her own life, get help right away. You can go to your nearest emergency department or: Call your local emergency services (911 in the [place] Call a suicide crisis helpline, such as the National Suicide Prevention Lifeline at 731-408-0504. This is open 24 hours a day in the [place] Text the Crisis Text Line at 847-549-0943  (in the [place] Summary Dementia is a term used to describe a number of symptoms that affect memory and thinking. Dementia usually gets worse slowly over time. Take steps to reduce the person's risk of injury and to plan for future care. Caregivers need support, relief from caregiving, and time for their own lives. This information is not intended to replace advice given to you by your health care provider. Make sure you discuss any questions you have with your health care provider. Document Revised: 02/16/2020 Document Reviewed: 02/16/2020 Elsevier Patient Education  2022 [employer].

## 2021-07-11 NOTE — Assessment & Plan Note (Signed)
Chronic, ongoing.  Has missed neurology follow-up, recommend they schedule.  Recheck B12 level today and start oral supplement if low, discussed with wife.

## 2021-07-11 NOTE — Progress Notes (Signed)
New Patient Office Visit  Subjective:  Patient ID: Larry Buckley, male    DOB: 02-03-1941  Age: 80 y.o. MRN: [id number]  CC:  Chief Complaint  Patient presents with   Establish Care    Patient is here to establish care. Patient wife states she would discuss if patient is currently having early stages of Dementia or Alzheimer's Disease. Patient wife states patient has been having more frequent episodes of forgetting things. She first noticed about for about a year now.     HPI LABARON Buckley presents for new patient visit to establish care.  Introduced to Publishing rights manager role and practice setting.  All questions answered.  Discussed provider/patient relationship and expectations.  His wife is with him today and helps with HPI -- she has Amyloid issues cancer wise and reports when she goes in hospital patient does not do well.  Has two daughter who assist with him.  DEMENTIA: Currently followed by neurology, with last visit on 06/15/20 with Dr. Sherryll Burger.  They restarted Amlodipine 10 MG due to elevation of BP in office and continued his Atorvastatin 20 MG daily + they started ASA 81 MG daily.  Per note mild to moderate Alzheimer's with Lewy Body component == MRI was ordered and labs obtained.  He was noted to have a low B12 at 161 = received B12 shots with las on 06/28/20.  MRI noted moderate white matter microvascular ischemia and metabolic changes + atrophy -- which they reported was concerning for amyloid angiopathy -- no new medications prescribed at their last neuro visit -- they were to schedule follow-up in 6 months.    His wife reports he does not take his medications -- he forgets to take them.  He does bath himself, no incontinence, no behaviors.  His wife reports he has gotten lost 3 times, driving around.  Last time he got keys when his wife was in hospital. Likes to go to [employer]  Does have family history of dementia -- brother recently passed.  6CIT Screen  07/11/2021  What Year? 4 points  What month? 3 points  What time? 3 points  Count back from 20 0 points  Months in reverse 4 points  Repeat phrase 8 points  Total Score 22    HYPERTENSION / HYPERLIPIDEMIA No current medications on review -- is to be taking Amlodipine 10 MG and Atorvastatin 20 MG as ordered by neuro + ASA.  AAA repaired several years ago. Satisfied with current treatment? yes Duration of hypertension: chronic BP monitoring frequency: not checking BP range:  BP medication side effects: no Duration of hyperlipidemia: chronic Cholesterol medication side effects: no Cholesterol supplements: none Medication compliance: good compliance Aspirin: yes Recent stressors: no Recurrent headaches: no Visual changes: no Palpitations: no Dyspnea: no Chest pain: no Lower extremity edema: no Dizzy/lightheaded: no   UPPER RESPIRATORY TRACT INFECTION Wife reports symptoms started on Friday with sneezing and rhinorrhea. Fever: yes Cough: yes Shortness of breath: no Wheezing: no Chest pain: no Chest tightness: no Chest congestion: no Nasal congestion: yes Runny nose: yes Post nasal drip: yes Sneezing: yes Sore throat: no Swollen glands: no Sinus pressure: no Headache: no Face pain: no Toothache: no Ear pain: none Ear pressure: none Eyes red/itching:no Eye drainage/crusting: no  Vomiting: no Rash: no Fatigue: no Sick contacts: no Strep contacts: no  Context: stable Recurrent sinusitis: no Relief with OTC cold/cough medications: no  Treatments attempted: none    Past Medical History:  Diagnosis Date   AAA (abdominal aortic aneurysm) (HCC)    Dementia (HCC) 05/21/2020   Gout, arthropathy    Hyperlipidemia    Hypertension     Past Surgical History:  Procedure Laterality Date   PERIPHERAL VASCULAR CATHETERIZATION N/A 02/08/2016   Procedure: Endovascular Repair/Stent Graft;  Surgeon: Annice Needy, MD;  Location: [facility] INVASIVE CV LAB;  Service:  Cardiovascular;  Laterality: N/A;    Family History  Problem Relation Age of Onset   Cancer Mother    Diabetes Mother    Heart disease Father    Hypertension Father    Heart disease Brother     Social History   Socioeconomic History   Marital status: Married    Spouse name: Not on file   Number of children: Not on file   Years of education: Not on file   Highest education level: Not on file  Occupational History   Not on file  Tobacco Use   Smoking status: Former   Smokeless tobacco: Never  Substance and Sexual Activity   Alcohol use: Yes   Drug use: No   Sexual activity: Not on file  Other Topics Concern   Not on file  Social History Narrative   Not on file   Social Determinants of Health   Financial Resource Strain: Low Risk    Difficulty of Paying Living Expenses: Not hard at all  Food Insecurity: No Food Insecurity   Worried About Programme researcher, broadcasting/film/video in the Last Year: Never true   Ran Out of Food in the Last Year: Never true  Transportation Needs: No Transportation Needs   Lack of Transportation (Medical): No   Lack of Transportation (Non-Medical): No  Physical Activity: Insufficiently Active   Days of Exercise per Week: 4 days   Minutes of Exercise per Session: 30 min  Stress: No Stress Concern Present   Feeling of Stress : Not at all  Social Connections: Moderately Integrated   Frequency of Communication with Friends and Family: More than three times a week   Frequency of Social Gatherings with Friends and Family: More than three times a week   Attends Religious Services: 1 to 4 times per year   Active Member of [employer] or Organizations: No   Attends Banker Meetings: Never   Marital Status: Married  Catering manager Violence: Not At Risk   Fear of Current or Ex-Partner: No   Emotionally Abused: No   Physically Abused: No   Sexually Abused: No    ROS Review of Systems  Constitutional:  Positive for fever. Negative for activity change,  appetite change, chills, diaphoresis, fatigue and unexpected weight change.  HENT:  Positive for congestion, postnasal drip and rhinorrhea. Negative for sinus pressure, sinus pain, sneezing, sore throat and voice change.   Respiratory:  Positive for cough. Negative for chest tightness, shortness of breath and wheezing.   Cardiovascular:  Negative for chest pain, palpitations and leg swelling.  Gastrointestinal: Negative.   Neurological:  Negative for dizziness, syncope, weakness, light-headedness, numbness and headaches.  Psychiatric/Behavioral: Negative.     Objective:   Today's Vitals: BP (!) 148/80 (BP Location: Left Arm)   Pulse 69   Temp (!) 100.6 F (38.1 C) (Oral)   Ht 5\' 10"  (1.778 m)   Wt 180 lb 9.6 oz (81.9 kg)   SpO2 96%   BMI 25.91 kg/m   Physical Exam Vitals and nursing note reviewed.  Constitutional:      General: He is awake. He  is not in acute distress.    Appearance: He is well-developed and well-groomed. He is not ill-appearing or toxic-appearing.  HENT:     Head: Normocephalic and atraumatic.     Right Ear: Hearing, tympanic membrane, ear canal and external ear normal. No drainage.     Left Ear: Hearing, tympanic membrane, ear canal and external ear normal. No drainage.     Nose: Rhinorrhea present. Rhinorrhea is clear.     Right Sinus: No maxillary sinus tenderness or frontal sinus tenderness.     Left Sinus: No maxillary sinus tenderness or frontal sinus tenderness.  Eyes:     General: Lids are normal.        Right eye: No discharge.        Left eye: No discharge.     Conjunctiva/sclera: Conjunctivae normal.     Pupils: Pupils are equal, round, and reactive to light.  Neck:     Thyroid: No thyromegaly.     Vascular: No carotid bruit.  Cardiovascular:     Rate and Rhythm: Normal rate and regular rhythm.     Heart sounds: Normal heart sounds, S1 normal and S2 normal. No murmur heard.   No gallop.  Pulmonary:     Effort: Pulmonary effort is normal. No  accessory muscle usage or respiratory distress.     Breath sounds: Normal breath sounds.  Abdominal:     General: Bowel sounds are normal.     Palpations: Abdomen is soft.  Musculoskeletal:        General: Normal range of motion.     Cervical back: Normal range of motion and neck supple.     Right lower leg: No edema.     Left lower leg: No edema.  Lymphadenopathy:     Cervical: No cervical adenopathy.  Skin:    General: Skin is warm and dry.     Capillary Refill: Capillary refill takes less than 2 seconds.     Findings: No rash.  Neurological:     Mental Status: He is alert.     Cranial Nerves: Cranial nerves are intact.     Coordination: Coordination is intact.     Gait: Gait is intact.     Deep Tendon Reflexes: Reflexes are normal and symmetric.     Reflex Scores:      Brachioradialis reflexes are 2+ on the right side and 2+ on the left side.      Patellar reflexes are 2+ on the right side and 2+ on the left side.    Comments: Pleasantly confused, no able to report month, year, day.  Psychiatric:        Attention and Perception: Attention normal.        Mood and Affect: Mood normal.        Speech: Speech normal.        Behavior: Behavior normal. Behavior is cooperative.        Thought Content: Thought content normal.    Assessment & Plan:   Problem List Items Addressed This Visit       Cardiovascular and Mediastinum   Hypertension    Chronic, ongoing.  BP elevated in office today.  Recommend he monitor BP at least a few mornings a week at home and document.  DASH diet at home.  Continue current medication regimen and adjust as needed -- refills sent in and recommend using pill container at home and taking daily.  Will place CCM and Palliative referral to assist.  Labs today: CBC,  CMP, TSH, lipid.  Return in 4 weeks for recheck.       Relevant Medications   atorvastatin (LIPITOR) 20 MG tablet   amLODipine (NORVASC) 10 MG tablet   aspirin EC 81 MG tablet   Other  Relevant Orders   Urinalysis, Routine w reflex microscopic   Comprehensive metabolic panel   CBC with Differential/Platelet   TSH     Nervous and Auditory   Late onset Alzheimer's dementia without behavioral disturbance (HCC)    Chronic, progressive, has not returned to neurology as recommended.  Was to have 6 month follow-up.  No current memory medications, may benefit from this in future.  Have recommended wife call and schedule follow-up with neurology, she agrees with this plan -- reports she was not aware follow-up needed as no one scheduled it.  Check B12 level today.  Lots of education needed on disease process -- have recommend patient not drive vehicle at anytime, keys should be hidden, as concern for patient getting lost or in accident.  Will have palliative team and CCM involved, as major education needed.  Return in 4 weeks.      Relevant Orders   TSH   Amb Referral to Palliative Care   AMB Referral to [facility] Coordin[facility]     Other   Hyperlipidemia, mixed    Chronic, ongoing.  Continue current medication regimen and adjust as needed.  Recommend use of pill container at home.  Palliative and CCM referral placed.       Relevant Medications   atorvastatin (LIPITOR) 20 MG tablet   amLODipine (NORVASC) 10 MG tablet   aspirin EC 81 MG tablet   Other Relevant Orders   Comprehensive metabolic panel   Lipid Panel w/o Chol/HDL Ratio   B12 deficiency    Chronic, ongoing.  Has missed neurology follow-up, recommend they schedule.  Recheck B12 level today and start oral supplement if low, discussed with wife.        Relevant Orders   Vitamin B12   Fever    Acute since 07/08/21 -- check Covid and flu testing today.  Recommend self quarantine at this time until results return.  Is on day 4, if results positive for Covid tomorrow discussed with them will send in [facility] for coverage and treatment.  Recommend plenty of rest and fluids.  - Increased rest - Increasing  Fluids - Acetaminophen / ibuprofen as needed for fever/pain.  - Mucinex.  Return for worsening or ongoing symptoms.      Relevant Orders   Novel Coronavirus, NAA ([facility])   Veritor Flu A/B Waived   Other Visit Diagnoses     Encounter to establish care    -  Primary       Outpatient Encounter Medications as of 07/11/2021  Medication Sig   amLODipine (NORVASC) 10 MG tablet Take 1 tablet (10 mg total) by mouth daily.   aspirin EC 81 MG tablet Take 1 tablet (81 mg total) by mouth daily.   atorvastatin (LIPITOR) 20 MG tablet Take 1 tablet (20 mg total) by mouth daily.   [DISCONTINUED] amLODipine (NORVASC) 10 MG tablet Take 10 mg by mouth daily. (Patient not taking: Reported on 07/11/2021)   [DISCONTINUED] amoxicillin-clavulanate (AUGMENTIN) 875-125 MG tablet Take 1 tablet by mouth every 12 (twelve) hours. (Patient not taking: Reported on 07/11/2021)   [DISCONTINUED] aspirin EC 81 MG tablet Take 1 tablet (81 mg total) by mouth daily. (Patient not taking: Reported on 07/11/2021)   [DISCONTINUED] aspirin EC 81 MG tablet  Take 1 tablet (81 mg total) by mouth daily.   [DISCONTINUED] atorvastatin (LIPITOR) 20 MG tablet every evening. (Patient not taking: No sig reported)   [DISCONTINUED] docusate sodium (COLACE) 100 MG capsule Take 1 capsule (100 mg total) by mouth daily. (Patient not taking: No sig reported)   [DISCONTINUED] feeding supplement, ENSURE ENLIVE, (ENSURE ENLIVE) LIQD Take 237 mLs by mouth 2 (two) times daily between meals. (Patient not taking: Reported on 07/11/2021)   [DISCONTINUED] lisinopril (PRINIVIL,ZESTRIL) 20 MG tablet TAKE 1 TABLET BY MOUTH EVERY DAY IN THE MORNING (Patient not taking: No sig reported)   [DISCONTINUED] Multiple Vitamin (MULTIVITAMIN WITH MINERALS) TABS tablet Take 1 tablet by mouth daily. (Patient not taking: No sig reported)   [DISCONTINUED] oxyCODONE-acetaminophen (ROXICET) 5-325 MG tablet Take 1 tablet by mouth every 6 (six) hours as needed for severe pain.  (Patient not taking: No sig reported)   [DISCONTINUED] QUEtiapine (SEROQUEL) 25 MG tablet Take 1 tablet (25 mg total) by mouth at bedtime. (Patient not taking: No sig reported)   No facility-administered encounter medications on file as of 07/11/2021.    Follow-up: Return in about 4 weeks (around 08/08/2021) for HTN AND DEMENTIA.   Marjie Skiff, NP

## 2021-07-11 NOTE — Assessment & Plan Note (Signed)
Chronic, ongoing.  BP elevated in office today.  Recommend he monitor BP at least a few mornings a week at home and document.  DASH diet at home.  Continue current medication regimen and adjust as needed -- refills sent in and recommend using pill container at home and taking daily.  Will place CCM and Palliative referral to assist.  Labs today: CBC, CMP, TSH, lipid.  Return in 4 weeks for recheck.
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
## 2021-07-11 NOTE — Assessment & Plan Note (Signed)
Chronic, ongoing.  Continue current medication regimen and adjust as needed.  Recommend use of pill container at home.  Palliative and CCM referral placed.

## 2021-07-11 NOTE — Assessment & Plan Note (Signed)
Acute since 07/08/21 -- check Covid and flu testing today.  Recommend self quarantine at this time until results return.  Is on day 4, if results positive for Covid tomorrow discussed with them will send in [facility] for coverage and treatment.  Recommend plenty of rest and fluids.  - Increased rest - Increasing Fluids - Acetaminophen / ibuprofen as needed for fever/pain.  - Mucinex.  Return for worsening or ongoing symptoms.

## 2021-07-12 ENCOUNTER — Telehealth: Payer: Self-pay

## 2021-07-12 ENCOUNTER — Telehealth: Payer: Self-pay | Admitting: Nurse Practitioner

## 2021-07-12 LAB — COMPREHENSIVE METABOLIC PANEL
ALT: 11 IU/L (ref 0–44)
AST: 12 IU/L (ref 0–40)
Albumin/Globulin Ratio: 1.3 (ref 1.2–2.2)
Albumin: 4 g/dL (ref 3.6–4.6)
Alkaline Phosphatase: 75 IU/L (ref 44–121)
BUN/Creatinine Ratio: 14 (ref 10–24)
BUN: 13 mg/dL (ref 8–27)
Bilirubin Total: 0.3 mg/dL (ref 0.0–1.2)
CO2: 26 mmol/L (ref 20–29)
Calcium: 9.4 mg/dL (ref 8.6–10.2)
Chloride: 103 mmol/L (ref 96–106)
Creatinine, Ser: 0.93 mg/dL (ref 0.76–1.27)
Globulin, Total: 3.2 g/dL (ref 1.5–4.5)
Glucose: 105 mg/dL — ABNORMAL HIGH (ref 70–99)
Potassium: 5 mmol/L (ref 3.5–5.2)
Sodium: 141 mmol/L (ref 134–144)
Total Protein: 7.2 g/dL (ref 6.0–8.5)
eGFR: 82 mL/min/{1.73_m2} (ref >59)

## 2021-07-12 LAB — CBC WITH DIFFERENTIAL/PLATELET
Basophils Absolute: 0.1 10*3/uL (ref 0.0–0.2)
Basos: 1 %
EOS (ABSOLUTE): 0.2 10*3/uL (ref 0.0–0.4)
Eos: 5 %
Hematocrit: 47.1 % (ref 37.5–51.0)
Hemoglobin: 15.7 g/dL (ref 13.0–17.7)
Immature Grans (Abs): 0 10*3/uL (ref 0.0–0.1)
Immature Granulocytes: 0 %
Lymphocytes Absolute: 1.2 10*3/uL (ref 0.7–3.1)
Lymphs: 27 %
MCH: 30 pg (ref 26.6–33.0)
MCHC: 33.3 g/dL (ref 31.5–35.7)
MCV: 90 fL (ref 79–97)
Monocytes Absolute: 0.5 10*3/uL (ref 0.1–0.9)
Monocytes: 12 %
Neutrophils Absolute: 2.3 10*3/uL (ref 1.4–7.0)
Neutrophils: 55 %
Platelets: 328 10*3/uL (ref 150–450)
RBC: 5.24 x10E6/uL (ref 4.14–5.80)
RDW: 12.9 % (ref 11.6–15.4)
WBC: 4.3 10*3/uL (ref 3.4–10.8)

## 2021-07-12 LAB — LIPID PANEL W/O CHOL/HDL RATIO
Cholesterol, Total: 216 mg/dL — ABNORMAL HIGH (ref 100–199)
HDL: 50 mg/dL (ref >39)
LDL Chol Calc (NIH): 151 mg/dL — ABNORMAL HIGH (ref 0–99)
Triglycerides: 86 mg/dL (ref 0–149)
VLDL Cholesterol Cal: 15 mg/dL (ref 5–40)

## 2021-07-12 LAB — SARS-COV-2, NAA 2 DAY TAT

## 2021-07-12 LAB — VITAMIN B12: Vitamin B-12: 431 pg/mL (ref 232–1245)

## 2021-07-12 LAB — NOVEL CORONAVIRUS, NAA: SARS-CoV-2, NAA: NOT DETECTED

## 2021-07-12 LAB — TSH: TSH: 1.46 u[IU]/mL (ref 0.450–4.500)

## 2021-07-12 NOTE — Telephone Encounter (Signed)
Informed patient of results and recommendations.  Per patient wife patient had not been taking cholesterol medications and will restart now.

## 2021-07-12 NOTE — Progress Notes (Signed)
Please let Mr. Birdsell know Covid is negative.  Thank you.

## 2021-07-12 NOTE — Telephone Encounter (Signed)
-----   Message from Marjie Skiff, NP sent at 07/12/2021  8:04 AM EDT ----- Good morning please let Pepper's wife know his labs have returned: - Kidney and liver function are normal. - CBC is normal with no anemia and B12 level improving, continue daily B12 supplement. - Cholesterol levels are high -- please ensure you are taking your Atorvastatin daily to help lower risk for stroke or heart attack. - Thyroid level normal.  Any questions? Keep being amazing!!  Thank you for allowing me to participate in your care.  I appreciate you. Kindest regards, Jolene

## 2021-07-12 NOTE — Progress Notes (Signed)
Good morning please let Danta's wife know his labs have returned: - Kidney and liver function are normal. - CBC is normal with no anemia and B12 level improving, continue daily B12 supplement. - Cholesterol levels are high -- please ensure you are taking your Atorvastatin daily to help lower risk for stroke or heart attack. - Thyroid level normal.  Any questions? Keep being amazing!!  Thank you for allowing me to participate in your care.  I appreciate you. Kindest regards, Jalayne Ganesh

## 2021-07-12 NOTE — Telephone Encounter (Signed)
Attempted to reach patient/wife to offer to schedule a Palliative Consult, no answer - left message with reason for call along with my name and call back number, requesting return call.

## 2021-07-13 ENCOUNTER — Telehealth: Payer: Self-pay

## 2021-07-13 NOTE — Chronic Care Management (AMB) (Signed)
  Chronic Care Management   Note  07/13/2021 Name: Kenton Fortin MRN: [id number] DOB: 1940/12/11  Milad Bublitz is a 80 y.o. year old male who is a primary care patient of Cannady, Barbaraann Faster, NP. I reached out to Vanna Scotland by phone today in response to a referral sent by Mr. Tia Gelb Lingenfelter's PCP.  Mr. Claunch was given information about Chronic Care Management services today including:  CCM service includes personalized support from designated clinical staff supervised by his physician, including individualized plan of care and coordination with other care providers 24/7 contact phone numbers for assistance for urgent and routine care needs. Service will only be billed when office clinical staff spend 20 minutes or more in a month to coordinate care. Only one practitioner may furnish and bill the service in a calendar month. The patient may stop CCM services at any time (effective at the end of the month) by phone call to the office staff. The patient is responsible for co-pay (up to 20% after annual deductible is met) if co-pay is required by the individual health plan.   Patient agreed to services and verbal consent obtained.   Follow up plan: Telephone appointment with care management team member scheduled for: RN CM 07/20/2021 LCSW 07/21/2021  Noreene Larsson, [address], [place], [place] [address] Direct Dial: [phone] [email or web address]: [email or web address]

## 2021-07-13 NOTE — Chronic Care Management (AMB) (Signed)
  Chronic Care Management   Outreach Note  07/13/2021 Name: Larry Buckley MRN: [id number] DOB: [birth year]  Larry Buckley is a 80 y.o. year old male who is a primary care patient of Cannady, Dorie Rank, NP. I reached out to Antoine Poche by phone today in response to a referral sent by Mr. Dan Humphreys Josey's PCP, Marjie Skiff, NP      An unsuccessful telephone outreach was attempted today, advised me to call back they were in the car. The patient was referred to the case management team for assistance with care management and care coordination.   Follow Up Plan: The care management team will reach out to the patient again over the next 1 days.  If patient returns call to provider office, please advise to call Embedded Care Management Care Guide Penne Lash at [phone]  Penne Lash, RMA Care Guide, Embedded Care Coordination [facility]  [place], [place] [address] Direct Dial: [phone] [email or web address] Website: [email or web address]

## 2021-07-19 ENCOUNTER — Telehealth: Payer: Self-pay

## 2021-07-19 NOTE — Telephone Encounter (Signed)
Attempted to contact patient's wife Alan Ripper to schedule a Palliative Care consult appointment. No answer left a message to return call.

## 2021-07-20 ENCOUNTER — Telehealth: Payer: Self-pay

## 2021-07-20 ENCOUNTER — Telehealth: Payer: BC Managed Care – PPO

## 2021-07-20 NOTE — Telephone Encounter (Signed)
  Care Management   Follow Up Note   07/20/2021 Name: Larry Buckley MRN: [id number] DOB: 21-Sep-1941   Referred by: Marjie Skiff, NP Reason for referral : Chronic Care Management (RNCM: Initial Outreach for Chronic Disease Management and Care Coordination Needs )   An unsuccessful telephone outreach was attempted today. The patient was referred to the case management team for assistance with care management and care coordination.   Follow Up Plan: A HIPPA compliant phone message was left for the patient providing contact information and requesting a return call.   Alto Denver RN, MSN, CCM Community Care Coordinator [place]  [facility] Mobile: [phone]

## 2021-07-21 ENCOUNTER — Telehealth: Payer: Self-pay | Admitting: *Deleted

## 2021-07-21 ENCOUNTER — Telehealth: Payer: BC Managed Care – PPO

## 2021-07-21 NOTE — Chronic Care Management (AMB) (Signed)
  Care Management   Note  07/21/2021 Name: Larry Buckley MRN: [id number] DOB: Jun 26, 1941  Larry Buckley is a 80 y.o. year old male who is a primary care patient of Marjie Skiff, NP and is actively engaged with the care management team. I reached out to Antoine Poche by phone today to assist with re-scheduling a follow up visit with the Licensed Clinical Social Worker  Follow up plan: Unsuccessful telephone outreach attempt made. A HIPAA compliant phone message was left for the patient providing contact information and requesting a return call.  The care management team will reach out to the patient again over the next 7 days.  If patient returns call to provider office, please advise to call Embedded Care Management Care Guide [name] at [phone].  Gwenevere Ghazi  Care Guide, Embedded Care Coordination [facility] Management  Direct Dial: [phone]

## 2021-07-22 ENCOUNTER — Telehealth: Payer: Self-pay | Admitting: Licensed Clinical Social Worker

## 2021-07-22 ENCOUNTER — Ambulatory Visit (INDEPENDENT_AMBULATORY_CARE_PROVIDER_SITE_OTHER): Payer: Medicare HMO | Admitting: Licensed Clinical Social Worker

## 2021-07-22 DIAGNOSIS — G301 Alzheimer's disease with late onset: Secondary | ICD-10-CM

## 2021-07-22 DIAGNOSIS — E782 Mixed hyperlipidemia: Secondary | ICD-10-CM

## 2021-07-22 DIAGNOSIS — I10 Essential (primary) hypertension: Secondary | ICD-10-CM

## 2021-07-22 DIAGNOSIS — F0280 Dementia in other diseases classified elsewhere without behavioral disturbance: Secondary | ICD-10-CM

## 2021-07-22 NOTE — Patient Instructions (Signed)
Visit Information   Goals Addressed             This Visit's Progress    Obtain Support in Dementia Management   On track    Timeframe:  Long-Range Goal Priority:  Medium Start Date:    07/22/21                         Expected End Date:     10/15/21                  Follow Up Date:09/30/21  Patient Goals/Self-Care Activities: Over the next 120 days Continue with compliance of taking medication  Increase healthy habits and continue utilizing coping skills Contact PCP office with any questions or concerns Attend all scheduled appts with providers        Patient verbalizes understanding of instructions provided today.   Telephone follow up appointment with care management team member scheduled for:09/30/21  Jenel Lucks, MSW, LCSW [facility] Care Management [facility]  [facility] [email or web address] Phone [phone] 12:35 PM

## 2021-07-22 NOTE — Telephone Encounter (Signed)
    Clinical Social Work  Chronic Care Management   Phone Outreach    07/22/2021 Name: Larry Buckley MRN: [id number] DOB: [birth year]  Larry Buckley is a 80 y.o. year old male who is a primary care patient of Cannady, Dorie Rank, NP .   Reason for referral: Mental Health Counseling and Resources and Caregiver Stress.    CCM LCSW reached out to patient today by phone to introduce self, assess needs and offer Care Management services and interventions.    Telephone outreach was unsuccessful. A HIPPA compliant phone message was left for the patient providing contact information and requesting a return call.   Plan:CCM LCSW will wait for return call. If no return call is received, Will route chart to Care Guide to see if patient would like to reschedule phone appointment   Review of patient status, including review of consultants reports, relevant laboratory and other test results, and collaboration with appropriate care team members and the patient's provider was performed as part of comprehensive patient evaluation and provision of care management services.     Jenel Lucks, MSW, LCSW [facility] Care Management [place]  [facility] [email or web address] Phone [phone] 9:27 AM

## 2021-07-22 NOTE — Chronic Care Management (AMB) (Signed)
Chronic Care Management    Clinical Social Work Note  07/22/2021 Name: Larry Buckley MRN: [id number] DOB: [birth year]  Larry Buckley is a 80 y.o. year old male who is a primary care patient of Cannady, Barbaraann Faster, NP. The CCM team was consulted to assist the patient with chronic disease management and/or care coordination needs related to: Mental Health Counseling and Resources.   Engaged with patient's spouse by telephone for initial visit in response to provider referral for social work chronic care management and care coordination services.   Consent to Services:  The patient was given the following information about Chronic Care Management services today, agreed to services, and gave verbal consent: 1. CCM service includes personalized support from designated clinical staff supervised by the primary care provider, including individualized plan of care and coordination with other care providers 2. 24/7 contact phone numbers for assistance for urgent and routine care needs. 3. Service will only be billed when office clinical staff spend 20 minutes or more in a month to coordinate care. 4. Only one practitioner may furnish and bill the service in a calendar month. 5.The patient may stop CCM services at any time (effective at the end of the month) by phone call to the office staff. 6. The patient will be responsible for cost sharing (co-pay) of up to 20% of the service fee (after annual deductible is met). Patient agreed to services and consent obtained.  Patient agreed to services and consent obtained.   Consent to Services:  The patient was given information about Care Management services, agreed to services, and gave verbal consent prior to initiation of services.  Please see initial visit note for detailed documentation.   Patient agreed to services today and consent obtained.   Assessment: Engaged with patient's spouse, Larry Buckley by phone in response to provider referral for social  work care coordination services: Assessed patient's previous and current treatment, coping skills, support system and barriers to care. He receives strong support from family and friends. Strategies and supportive resources offered to assist with management of health conditions. See Care Plan below for interventions and patient self-care actives.  Recent life changes or stressors: Management of health conditions  Recommendation: Patient may benefit from, and is in agreement work with LCSW to address care coordination needs and will continue to work with the clinical team to address health care and disease management related needs.   Follow up Plan: Patient would like continued follow-up from CCM LCSW .  per patient's request will follow up in 09/30/21.  Will call office if needed prior to next encounter.  SDOH (Social Determinants of Health) assessments and interventions performed:  NA  Advanced Directives Status: Not addressed in this encounter.  CCM Care Plan  Allergies  Allergen Reactions   Lotensin [Benazepril Hcl] Swelling    Outpatient Encounter Medications as of 07/22/2021  Medication Sig   amLODipine (NORVASC) 10 MG tablet Take 1 tablet (10 mg total) by mouth daily.   aspirin EC 81 MG tablet Take 1 tablet (81 mg total) by mouth daily.   atorvastatin (LIPITOR) 20 MG tablet Take 1 tablet (20 mg total) by mouth daily.   No facility-administered encounter medications on file as of 07/22/2021.    Patient Active Problem List   Diagnosis Date Noted   Late onset Alzheimer's dementia without behavioral disturbance ([place]) 07/11/2021   B12 deficiency 07/11/2021   Fever 07/11/2021   Hyperlipidemia, mixed    Hypertension    Gout, arthropathy  Conditions to be addressed/monitored: Dementia; Mental Health Concerns   Care Plan : LCSW Plan of Care  Updates made by Rebekah Chesterfield, LCSW since 07/22/2021 12:00 AM     Problem: Dementia Management and Support (General Plan of Care)    Priority: Medium  Onset Date: 07/22/2021     Long-Range Goal: Dementia Management   Start Date: 07/22/2021  This Visit's Progress: On track  Priority: Medium  Note:   Current barriers:    Memory Deficits Clinical Goals: Patient will work with CCM LCSW to address needs related to management of dementia Clinical Interventions:  Assessment of needs and barriers to care All of the hx was provided by patient's spouse, Larry Buckley Patient can stay home independently while spouse completes medical treatments and/or appointments. He can stay at home. Family receives strong support from adult daughters and neighbors Patient completes ADL's independently. Denies appetite or sleep concerns Patient is not experiencing depression or anxiety symptoms. Spouse and patient are not in need of therapy or supportive resources, at this time Spouse reports that they no longer allow patient to drive a vehicle. He does not have access to keys and is reported to have a calm temperament majority of the time  CCM LCSW reviewed upcoming appointments. Pt has an upcoming appt with Neurologist, scheduled for 08/08/21 CCM LCSW collaborated with CCM RN. LCSW sent message informing spouse's preferred time to be contacted Depression screen reviewed  Active listening / Reflection utilized  Emotional Support Provided Provided psychoeducation for mental health needs  Quality of sleep assessed & Sleep Hygiene techniques promoted  Verbalization of feelings encouraged  Review various resources, discussed options and provided patient information about  Dementia resources and support  1:1 collaboration with primary care provider regarding development and update of comprehensive plan of care as evidenced by provider attestation and co-signature Inter-disciplinary care team collaboration (see longitudinal plan of care) Patient Goals/Self-Care Activities: Over the next 120 days Continue with compliance of taking medication  Increase  healthy habits and continue utilizing coping skills Contact PCP office with any questions or concerns Attend all scheduled appts with providers      Christa See, MSW, [email or web address] Phone [phone] 12:38 PM

## 2021-07-26 ENCOUNTER — Telehealth: Payer: Self-pay

## 2021-07-26 NOTE — Telephone Encounter (Signed)
  Care Management   Follow Up Note   07/26/2021 Name: Larry Buckley MRN: [id number] DOB: 1941/07/13   Referred by: Marjie Skiff, NP Reason for referral : Chronic Care Management (RNCM: Initial Outreach for Chronic Disease Management and Care Coordination Needs )   An unsuccessful telephone outreach was attempted today. The patient was referred to the case management team for assistance with care management and care coordination.   Follow Up Plan: A HIPPA compliant phone message was left for the patient providing contact information and requesting a return call.   Alto Denver RN, MSN, CCM Community Care Coordinator [place]  [facility] Mobile: [phone]

## 2021-07-28 ENCOUNTER — Telehealth: Payer: Self-pay

## 2021-07-28 NOTE — Chronic Care Management (AMB) (Signed)
  Care Management   Note  07/28/2021 Name: Larry Buckley MRN: [id number] DOB: 1941-08-14  Larry Buckley is a 80 y.o. year old male who is a primary care patient of Marjie Skiff, NP and is actively engaged with the care management team. I reached out to Antoine Poche by phone today to assist with re-scheduling an initial visit with the Licensed Clinical Social Worker  Follow up plan: A second unsuccessful telephone outreach attempt made. A HIPAA compliant phone message was left for the patient providing contact information and requesting a return call. The care management team will reach out to the patient again over the next 7 days. If patient returns call to provider office, please advise to call Embedded Care Management Care Guide [name] at [phone].  Gwenevere Ghazi  Care Guide, Embedded Care Coordination [facility] Management  Direct Dial: [phone]

## 2021-07-28 NOTE — Telephone Encounter (Signed)
  Care Management   Follow Up Note   07/28/2021 Name: Larry Buckley MRN: [id number] DOB: October 03, 1941   Referred by: Marjie Skiff, NP Reason for referral : Chronic Care Management (RNCM: Chronic Disease Management and Care Coordination Needs-Initial outreach)   An unsuccessful telephone outreach was attempted today. The patient was referred to the case management team for assistance with care management and care coordination.   Follow Up Plan: A HIPPA compliant phone message was left for the patient providing contact information and requesting a return call.   Alto Denver RN, MSN, CCM Community Care Coordinator [place]  [facility] Mobile: [phone]

## 2021-08-01 ENCOUNTER — Telehealth: Payer: Self-pay

## 2021-08-01 ENCOUNTER — Ambulatory Visit: Payer: Self-pay

## 2021-08-01 DIAGNOSIS — F0280 Dementia in other diseases classified elsewhere without behavioral disturbance: Secondary | ICD-10-CM

## 2021-08-01 DIAGNOSIS — E782 Mixed hyperlipidemia: Secondary | ICD-10-CM

## 2021-08-01 DIAGNOSIS — I10 Essential (primary) hypertension: Secondary | ICD-10-CM

## 2021-08-01 DIAGNOSIS — G301 Alzheimer's disease with late onset: Secondary | ICD-10-CM

## 2021-08-01 NOTE — Telephone Encounter (Signed)
  Care Management   Follow Up Note   08/01/2021 Name: Larry Buckley MRN: [id number] DOB: 15-Nov-1940   Referred by: Marjie Skiff, NP Reason for referral : Chronic Care Management (RNCM: Initial Outreach for Chronic Disease Management and Care Coordination Needs)   Third unsuccessful telephone outreach was attempted today. The patient was referred to the case management team for assistance with care management and care coordination. The patient's primary care provider has been notified of our unsuccessful attempts to make or maintain contact with the patient. The care management team is pleased to engage with this patient at any time in the future should he/she be interested in assistance from the care management team.   Follow Up Plan: We have been unable to make contact with the patient for follow up. The care management team is available to follow up with the patient after provider conversation with the patient regarding recommendation for care management engagement and subsequent re-referral to the care management team.   Alto Denver RN, MSN, CCM Community Care Coordinator [place]  [facility] Mobile: [phone]

## 2021-08-01 NOTE — Patient Instructions (Addendum)
Visit Information   PATIENT GOALS:   Goals Addressed             This Visit's Progress    RNCM: Manage My Medicine       Timeframe:  Long-Range Goal Priority:  High Start Date:       08-01-2021                      Expected End Date:     08-01-2022                  Follow Up Date 08/23/2021    - call for medicine refill 2 or 3 days before it runs out - call if I am sick and can't take my medicine - keep a list of all the medicines I take; vitamins and herbals too - learn to read medicine labels - use a pillbox to sort medicine - use an alarm clock or phone to remind me to take my medicine    Why is this important?   These steps will help you keep on track with your medicines.   08-01-2021: Per the patients wife the patient is taking his medications as directed. She makes sure he is taking his medications. Knows the CCM team is available to assist with questions or concerns that may arise. Discussed bubble packaging system. Will continue to monitor and assist as needed with changes.          CLINICAL CARE PLAN: Patient Care Plan: LCSW Plan of Care     Problem Identified: Dementia Management and Support (General Plan of Care)   Priority: Medium  Onset Date: 07/22/2021     Long-Range Goal: Dementia Management   Start Date: 07/22/2021  This Visit's Progress: On track  Priority: Medium  Note:   Current barriers:    Memory Deficits Clinical Goals: Patient will work with CCM LCSW to address needs related to management of dementia Clinical Interventions:  Assessment of needs and barriers to care All of the hx was provided by patient's spouse, Morey Hummingbird Patient can stay home independently while spouse completes medical treatments and/or appointments. He can stay at home. Family receives strong support from adult daughters and neighbors Patient completes ADL's independently. Denies appetite or sleep concerns Patient is not experiencing depression or anxiety symptoms. Spouse  and patient are not in need of therapy or supportive resources, at this time Spouse reports that they no longer allow patient to drive a vehicle. He does not have access to keys and is reported to have a calm temperament majority of the time  CCM LCSW reviewed upcoming appointments. Pt has an upcoming appt with Neurologist, scheduled for 08/08/21 CCM LCSW collaborated with CCM RN. LCSW sent message informing spouse's preferred time to be contacted Depression screen reviewed  Active listening / Reflection utilized  Emotional Support Provided Provided psychoeducation for mental health needs  Quality of sleep assessed & Sleep Hygiene techniques promoted  Verbalization of feelings encouraged  Review various resources, discussed options and provided patient information about  Dementia resources and support  1:1 collaboration with primary care provider regarding development and update of comprehensive plan of care as evidenced by provider attestation and co-signature Inter-disciplinary care team collaboration (see longitudinal plan of care) Patient Goals/Self-Care Activities: Over the next 120 days Continue with compliance of taking medication  Increase healthy habits and continue utilizing coping skills Contact PCP office with any questions or concerns Attend all scheduled appts with providers     Patient  Care Plan: RNCM: General Plan of Care (Adult) For Chronic Disease Management and Care Coordination Needs     Problem Identified: RNCM; Development of Plan of Care for Chronic Disease Management and Care Coordination Needs ( Alzheimer's/HTN/HLD)   Priority: High     Long-Range Goal: RNCM; Development of Plan of Care for Chronic Disease Management and Care Coordination Needs ( Alzheimer's/HTN/HLD)   Start Date: 08/01/2021  Expected End Date: 08/01/2022  Priority: High  Note:   Current Barriers:  Knowledge Deficits related to plan of care for management of HTN, HLD, and Alzheimer's with  lewy body component  Care Coordination needs related to Level of care concerns  Chronic Disease Management support and education needs related to HTN, HLD, and Alzheimer's with lewy body component dementia Lacks caregiver support.  Cognitive Deficits Patient's spouse currently taking cancer treatments on Monday-Wednesday-Friday in [place]):  Patient will verbalize understanding of plan for management of HTN, HLD, and Alzheimer's with Lewy body component  verbalize basic understanding of HTN, HLD, and Alzheimer's with Lewy body component disease process and self health management plan   take all medications exactly as prescribed and will call provider for medication related questions demonstrate understanding of rationale for each prescribed medication the patient is taking medications as prescribed. His wife assist in his care and makes sure he is compliant with medications.  attend all scheduled medical appointments: 08-10-2021 at 0940 am, also with neurologist on this day also demonstrate improved and ongoing adherence to prescribed treatment plan for HTN, HLD, and Alzheimer's with Lewy body component as evidenced by adherence to prescribed medication regimen contacting provider for new or worsened symptoms or questions   demonstrate improved and ongoing health management independence by remaining safe in environment and not causing injury to self or others continue to work with Consulting civil engineer and/or Social Worker to address care management and care coordination needs related to HTN, HLD, and Alzheimer's with Lewy body component  work with family and patients spouse to learn about the progression of Alzheimer's and share available resources to help with effective management of the patients chronic conditions.  demonstrate ongoing self health care management ability for effective management of chronic conditions  through collaboration with RN Care manager, provider, and  care team.   Interventions: 1:1 collaboration with primary care provider regarding development and update of comprehensive plan of care as evidenced by provider attestation and co-signature Inter-disciplinary care team collaboration (see longitudinal plan of care) Evaluation of current treatment plan related to  self management and patient's adherence to plan as established by provider   SDOH Barriers (Status: New goal.)  Patient interviewed and SDOH assessment performed        Patient interviewed and appropriate assessments performed Provided patient with information about resources available to the patient and patients family for understanding chronic conditions and effective management of chronic conditions Discussed plans with patient for ongoing care management follow up and provided patient with direct contact information for care management team Advised patient to keep appointments, call the office if appointments need to be rescheduled, call the [facility] for educational needs, questions, or concerns, and work with the CCM team to optimize health and well being Provided education to patient/caregiver regarding level of care options.    Alzheimer's with Lewy body component  (Status: New goal.) Evaluation of current treatment plan related to  Alzheimer's with Lewy Body component  , Level of care concerns and Memory Deficits self-management and patient's adherence to plan  as established by provider. Discussed plans with patient for ongoing care management follow up and provided patient with direct contact information for care management team Advised patient to call the office for changes in condition, worsening memory deficits, forgettfulness in taking medications or other deviations from normal; Provided education to patient re: "the 36 hour day" resource book for insights to patients with dementia and Alzheimer's.  Also discussed safety concerns with the patients wife and daughter. The  patient continues to drive and they are concerned about this but have not been able to prevent the pateint from driving. The pateint goes to meet friends at the local store daily and runs some errands. They are noticing changes in his memory. Review the concern for safety. Will continue to monitor for changes; Reviewed medications with patient and discussed compliance. The patient is compliant with medications at this time. The patients wife was inquiring about medications that may help with the patients Alzheimer's and education provided that follow up with neuorlogist would determine the medication plan of care for the patient .The patient has an appointment on 08-10-2021 with the neurologist; Provided patient with dementia and Alzheimer education and the 36 hour day resource educational materials related to progression process of Alzheimer's/dementia. Will print off information and mail to the patients spouse; Reviewed scheduled/upcoming provider appointments including 08-10-2021 at 0940 am; Discussed plans with patient for ongoing care management follow up and provided patient with direct contact information for care management team; Advised patient to discuss questions and concerns with provider; Screening for signs and symptoms of depression related to chronic disease state;  Assessed social determinant of health barriers;   Hyperlipidemia:  (Status: New goal.) Lab Results  Component Value Date   CHOL 216 (H) 07/11/2021   HDL 50 07/11/2021   LDLCALC 151 (H) 07/11/2021   TRIG 86 07/11/2021   CHOLHDL 3.4 02/10/2016     Medication review performed; medication list updated in electronic medical record.  Provider established cholesterol goals reviewed; Counseled on importance of regular laboratory monitoring as prescribed; Provided HLD educational materials; Reviewed role and benefits of statin for ASCVD risk reduction; Discussed strategies to manage statin-induced myalgias; Reviewed  importance of limiting foods high in cholesterol;  Hypertension: (Status: New goal.) Last practice recorded BP readings:  BP Readings from Last 3 Encounters:  07/11/21 (!) 148/80  05/27/20 139/65  05/13/19 (!) 167/81  Most recent eGFR/CrCl:  Lab Results  Component Value Date   EGFR 82 07/11/2021    No components found for: CRCL  Evaluation of current treatment plan related to hypertension self management and patient's adherence to plan as established by provider;   Provided education to patient re: stroke prevention, s/s of heart attack and stroke; Reviewed medications with patient and discussed importance of compliance;  Discussed plans with patient for ongoing care management follow up and provided patient with direct contact information for care management team; Advised patient, providing education and rationale, to monitor blood pressure daily and record, calling PCP for findings outside established parameters;  Reviewed scheduled/upcoming provider appointments including:  Provided education on prescribed diet heart healthy diet ;  Discussed complications of poorly controlled blood pressure such as heart disease, stroke, circulatory complications, vision complications, kidney impairment, sexual dysfunction;   Patient Goals/Self-Care Activities: Patient will self administer medications as prescribed as evidenced by self report/primary caregiver report  Patient will attend all scheduled provider appointments as evidenced by clinician review of documented attendance to scheduled appointments and patient/caregiver report Patient will call pharmacy for medication  refills as evidenced by patient report and review of pharmacy fill history as appropriate Patient will attend church or other social activities as evidenced by patient report Patient will continue to perform ADL's independently as evidenced by patient/caregiver report Patient will continue to perform IADL's independently as  evidenced by patient/caregiver report Patient will call provider office for new concerns or questions as evidenced by review of documented incoming telephone call notes and patient report Patient will work with BSW to address care coordination needs and will continue to work with the clinical team to address health care and disease management related needs as evidenced by documented adherence to scheduled care management/care coordination appointments - check blood pressure weekly, - choose a place to take my blood pressure (home, clinic or office, retail store), - write blood pressure results in a log or diary, - learn about high blood pressure, - take blood pressure log to all doctor appointments, - call doctor for signs and symptoms of high blood pressure, - develop an action plan for high blood pressure, - keep all doctor appointments, - take medications for blood pressure exactly as prescribed, - report new symptoms to your doctor, and - eat more whole grains, fruits and vegetables, lean meats and healthy fats - call for medicine refill 2 or 3 days before it runs out, - take all medications exactly as prescribed, - call doctor with any symptoms you believe are related to your medicine, - call doctor when you experience any new symptoms, - go to all doctor appointments as scheduled, and - adhere to prescribed diet: heart healthy diet     Consent to CCM Services: Mr. Wafer was given information about Chronic Care Management services including:  CCM service includes personalized support from designated clinical staff supervised by his physician, including individualized plan of care and coordination with other care providers 24/7 contact phone numbers for assistance for urgent and routine care needs. Service will only be billed when office clinical staff spend 20 minutes or more in a month to coordinate care. Only one practitioner may furnish and bill the service in a calendar month. The patient may  stop CCM services at any time (effective at the end of the month) by phone call to the office staff. The patient will be responsible for cost sharing (co-pay) of up to 20% of the service fee (after annual deductible is met).  Patient agreed to services and verbal consent obtained.   The patient verbalized understanding of instructions, educational materials, and care plan provided today and agreed to receive a mailed copy of patient instructions, educational materials, and care plan.   Telephone follow up appointment with care management team member scheduled for: 08-23-2021 at 345 pm  [place], MSN, [facility] [phone]  Lewy Body Dementia Lewy body dementia, also called dementia with Lewy bodies, is a condition that affects the way the brain functions. It is one form of dementia. In this condition, proteins called Lewy bodies build up in certain areas of the brain. This causes problems with: Memory. Decision making. Behavior. Speaking. Thinking and problem solving. Movements and balance. This condition is progressive, which means that it gets worse with time (is degenerative) and cannot be reversed. What are the causes? This condition is caused by the buildup of Lewy bodies in brain cells in areas of the brain that control memory, thinking, and movement. It is not known what causes the Lewy bodies to build  up. What increases the risk? You are more likely to develop this condition if you: Have a family history of Lewy body dementia or Parkinson's disease. Are 43 years old or older. Are male. What are the signs or symptoms? Symptoms of this condition may include: Seeing things that are not there (hallucinating). Sleep problems, such as acting out dreams while you are asleep. Changes in memory, attention, and concentration that come and go (fluctuate). Symptoms of dementia, such as: Trouble  with memory. Trouble paying attention. Problems with planning and organizing. Problems with judgment. Behavioral problems. Symptoms of Parkinson's disease, such as: Shaking movements that you cannot control (tremor). Tremors usually start in a hand or foot when you are resting (resting tremor). Stooped posture. Slowing of movement. Stiff muscles (rigidity). Loss of balance and stability when standing. How is this diagnosed? This condition is diagnosed by a specialist who diagnoses and treats this condition (neurologist). Your health care provider will talk with you and your family, friends, or caregivers about your history and symptoms. A thorough medical history will be taken, and you will have a physical exam and tests. Tests may include: Lab tests, such as blood or urine tests. Imaging tests, such as a CT scan, a PET scan, or an MRI. A test that involves removing and testing a small amount of the fluid that surrounds the brain and spinal cord (lumbar puncture). Tests that evaluate brain function, such as memory tests, cognitive tests, and neuropsychological tests. How is this treated? There is no cure for this condition. Treatment focuses on managing your symptoms. Treatment may include: Medicines to help with hallucinations, sleep, and behavioral problems. Speech, occupational, and physical therapy. Your health care provider can help direct you to support groups, organizations, and other health care providers who can help with decisions about your care. Follow these instructions at home: Medicines Take over-the-counter and prescription medicines only as told by your health care provider. To help you manage your medicines, use a pill organizer or pill reminder. Avoid taking medicines that can affect thinking, such as pain medicines or certain sleeping medicines. Always check with your health care provider before taking any new medicines. Lifestyle Make healthy lifestyle choices: Be  physically active as told by your health care provider. Do not use any products that contain nicotine or tobacco, such as cigarettes, e-cigarettes, and chewing tobacco. If you need help quitting, ask your health care provider. Try to practice stress-management techniques when you experience stress, such as mindfulness, yoga, or deep breathing. Stay socially connected. Talk regularly with other people, such as family, friends, and neighbors. Make sure you sleep well. These tips can help you get a good night's rest: Avoid napping during the day. Keep your sleeping area dark and cool. Avoid exercising a few hours before you go to bed. Avoid caffeine products in the evening. Eating and drinking Do not drink alcohol. Drink enough fluid to keep your urine pale yellow. Eat a healthy diet. Safety  Work with your health care provider to determine what you need help with and what your safety needs are. If you have trouble moving around, use a cane or walker as told by your health care provider. Make sure your home environment is safe. To do this: Remove things that can be a tripping hazard, such as throw rugs or clutter. Install grab bars and railings in your home to prevent falls. Talk with your health care provider about whether it is safe for you to drive. If you were given a  bracelet that identifies you as a person with memory loss or tracks your location, make sure to wear it at all times. General instructions  Work with your family to make important decisions, such as advance directives, medical power of attorney, or a living will. Keep all follow-up visits. This is important. Where to find more information [employer] of Neurological Disorders and Stroke: [employer] Contact a health care provider if you have: New or worsening confusion. New or worsening trouble with sleeping or increased daytime sleepiness. Problems with choking or swallowing. Get help right away if: You  feel depressed or sad, or feel that you want to harm yourself. Your family members become concerned for your safety. If you ever feel like you may hurt yourself or others, or have thoughts about taking your own life, get help right away. Go to your nearest emergency department or: Call your local emergency services (911 in the [place] Call a suicide crisis helpline, such as the [place] at (862) 325-7002. This is open 24 hours a day in the [place] Text the Crisis Text Line at 415-658-2911 (in the [place].). Summary Lewy body dementia is a condition that affects the way the brain functions. It causes problems with functions such as memory and thinking. Lewy body dementia gets worse with time. This condition is caused by the buildup of proteins called Lewy bodies in brain cells. It is not known what causes the Lewy bodies to build up. Work with your health care provider to determine what you need help with and what your safety needs are. Your health care provider can help direct you to support groups, organizations, and other health care providers who can help with decisions about your care. This information is not intended to replace advice given to you by your health care provider. Make sure you discuss any questions you have with your health care provider. Document Revised: 02/16/2020 Document Reviewed: 02/16/2020 Elsevier Patient Education  2022 [place]. Dementia Caregiver Guide Dementia is a term used to describe a number of symptoms that affect memory and thinking. The most common symptoms include: Memory loss. Trouble with language and communication. Trouble concentrating. Poor judgment and problems with reasoning. Wandering from home or public places. Extreme anxiety or depression. Being suspicious or having angry outbursts and accusations. Child-like behavior and language. Dementia can be frightening and confusing. And taking care of someone with dementia can be  challenging. This guide provides tips to help you when providing care for a person with dementia. How to help manage lifestyle changes Dementia usually gets worse slowly over time. In the early stages, people with dementia can stay independent and safe with some help. In later stages, they need help with daily tasks such as dressing, grooming, and using the bathroom. There are actions you can take to help a person manage his or her life while living with this condition. Communicating When the person is talking or seems frustrated, make eye contact and hold the person's hand. Ask specific questions that need yes or no answers. Use simple words, short sentences, and a calm voice. Only give one direction at a time. When offering choices, limit the person to just one or two. Avoid correcting the person in a negative way. If the person is struggling to find the right words, gently try to help him or her. Preventing injury  Keep floors clear of clutter. Remove rugs, magazine racks, and floor lamps. Keep hallways well lit, especially at night. Put a handrail and nonslip mat  in the bathtub or shower. Put childproof locks on cabinets that contain dangerous items, such as medicines, alcohol, guns, toxic cleaning items, sharp tools or utensils, matches, and lighters. For doors to the outside of the house, put the locks in places where the person cannot see or reach them easily. This will help ensure that the person does not wander out of the house and get lost. Be prepared for emergencies. Keep a list of emergency phone numbers and addresses in a convenient area. Remove car keys and lock garage doors so that the person does not try to get in the car and drive. Have the person wear a bracelet that tracks locations and identifies the person as having memory problems. This should be worn at all times for safety. Helping with daily life  Keep the person on track with his or her routine. Try to identify areas  where the person may need help. Be supportive, patient, calm, and encouraging. Gently remind the person that adjusting to changes takes time. Help with the tasks that the person has asked for help with. Keep the person involved in daily tasks and decisions as much as possible. Encourage conversation, but try not to get frustrated if the person struggles to find words or does not seem to appreciate your help. How to recognize stress Look for signs of stress in yourself and in the person you are caring for. If you notice signs of stress, take steps to manage it. Symptoms of stress include: Feeling anxious, irritable, frustrated, or angry. Denying that the person has dementia or that his or her symptoms will not improve. Feeling depressed, hopeless, or unappreciated. Difficulty sleeping. Difficulty concentrating. Developing stress-related health problems. Feeling like you have too little time for your own life. Follow these instructions at home: Take care of your health Make sure that you and the person you are caring for: Get regular sleep. Exercise regularly. Eat regular, nutritious meals. Take over-the-counter and prescription medicines only as told by your health care providers. Drink enough fluid to keep your urine pale yellow. Attend all scheduled health care appointments.  General instructions Join a support group with others who are caregivers. Ask about respite care resources. Respite care can provide short-term care for the person so that you can have a regular break from the stress of caregiving. Consider any safety risks and take steps to avoid them. Organize medicines in a pill box for each day of the week. Create a plan to handle any legal or financial matters. Get legal or financial advice if needed. Keep a calendar in a central location to remind the person of appointments or other activities. Where to find support: Many individuals and organizations offer support. These  include: Support groups for people with dementia. Support groups for caregivers. Counselors or therapists. Home health care services. Adult day care centers. Where to find more information Centers for Disease Control and Prevention: http://www.wolf.info/ Alzheimer's Association: CapitalMile.co.nz Family Caregiver Alliance: www.caregiver.[place]: www.alzfdn.org Contact a health care provider if: The person's health is rapidly getting worse. You are no longer able to care for the person. Caring for the person is affecting your physical and emotional health. You are feeling depressed or anxious about caring for the person. Get help right away if: The person threatens himself or herself, you, or anyone else. You feel depressed or sad, or feel that you want to harm yourself. If you ever feel like your loved one may hurt himself or herself or others, or  if he or she shares thoughts about taking his or her own life, get help right away. You can go to your nearest emergency department or: Call your local emergency services (911 in the [place] Call a suicide crisis helpline, such as the [facility] at 2790200915. This is open 24 hours a day in the [place] Text the Crisis Text Line at 814-166-5269 (in the [place].). Summary Dementia is a term used to describe a number of symptoms that affect memory and thinking. Dementia usually gets worse slowly over time. Take steps to reduce the person's risk of injury and to plan for future care. Caregivers need support, relief from caregiving, and time for their own lives. This information is not intended to replace advice given to you by your health care provider. Make sure you discuss any questions you have with your health care provider. Document Revised: 02/16/2020 Document Reviewed: 02/16/2020 Elsevier Patient Education  [place]. Cholesterol Content in Foods Cholesterol is a waxy, fat-like substance that helps  to carry fat in the blood. The body needs cholesterol in small amounts, but too much cholesterol can cause damage to the arteries and heart. Most people should eat less than 200 milligrams (mg) of cholesterol a day. Foods with cholesterol Cholesterol is found in animal-based foods, such as meat, seafood, and dairy. Generally, low-fat dairy and lean meats have less cholesterol than full-fat dairy and fatty meats. The milligrams of cholesterol per serving (mg per serving) of common cholesterol-containing foods are listed below. Meat and other proteins Egg -- one large whole egg has 186 mg. Veal shank -- 4 oz has 141 mg. Lean ground [place] (93% lean) -- 4 oz has 118 mg. Fat-trimmed lamb loin -- 4 oz has 106 mg. Lean ground beef (90% lean) -- 4 oz has 100 mg. Lobster -- 3.5 oz has 90 mg. Pork loin chops -- 4 oz has 86 mg. Canned salmon -- 3.5 oz has 83 mg. Fat-trimmed beef top loin -- 4 oz has 78 mg. Frankfurter -- 1 frank (3.5 oz) has 77 mg. Crab -- 3.5 oz has 71 mg. Roasted chicken without skin, white meat -- 4 oz has 66 mg. Light bologna -- 2 oz has 45 mg. Deli-cut [place] -- 2 oz has 31 mg. Canned tuna -- 3.5 oz has 31 mg. Berniece Salines -- 1 oz has 29 mg. Oysters and mussels (raw) -- 3.5 oz has 25 mg. Mackerel -- 1 oz has 22 mg. Trout -- 1 oz has 20 mg. Pork sausage -- 1 link (1 oz) has 17 mg. Salmon -- 1 oz has 16 mg. Tilapia -- 1 oz has 14 mg. Dairy Soft-serve ice cream --  cup (4 oz) has 103 mg. Whole-milk yogurt -- 1 cup (8 oz) has 29 mg. Cheddar cheese -- 1 oz has 28 mg. American cheese -- 1 oz has 28 mg. Whole milk -- 1 cup (8 oz) has 23 mg. 2% milk -- 1 cup (8 oz) has 18 mg. Cream cheese -- 1 tablespoon (Tbsp) has 15 mg. Cottage cheese --  cup (4 oz) has 14 mg. Low-fat (1%) milk -- 1 cup (8 oz) has 10 mg. Sour cream -- 1 Tbsp has 8.5 mg. Low-fat yogurt -- 1 cup (8 oz) has 8 mg. Nonfat Greek yogurt -- 1 cup (8 oz) has 7 mg. Half-and-half cream -- 1 Tbsp has 5 mg. Fats and  oils Cod liver oil -- 1 tablespoon (Tbsp) has 82 mg. Butter -- 1 Tbsp has 15 mg. Lard --  1 Tbsp has 14 mg. Bacon grease -- 1 Tbsp has 14 mg. Mayonnaise -- 1 Tbsp has 5-10 mg. Margarine -- 1 Tbsp has 3-10 mg. Exact amounts of cholesterol in these foods may vary depending on specific ingredients and brands. Foods without cholesterol Most plant-based foods do not have cholesterol unless you combine them with a food that has cholesterol. Foods without cholesterol include: Grains and cereals. Vegetables. Fruits. Vegetable oils, such as olive, canola, and sunflower oil. Legumes, such as peas, beans, and lentils. Nuts and seeds. Egg whites. Summary The body needs cholesterol in small amounts, but too much cholesterol can cause damage to the arteries and heart. Most people should eat less than 200 milligrams (mg) of cholesterol a day. This information is not intended to replace advice given to you by your health care provider. Make sure you discuss any questions you have with your health care provider. Document Revised: 01/13/2020 Document Reviewed: 02/23/2020 Elsevier Patient Education  2022 [place] Eating Plan DASH stands for Dietary Approaches to Stop Hypertension. The DASH eating plan is a healthy eating plan that has been shown to: Reduce high blood pressure (hypertension). Reduce your risk for type 2 diabetes, heart disease, and stroke. Help with weight loss. What are tips for following this plan? Reading food labels Check food labels for the amount of salt (sodium) per serving. Choose foods with less than 5 percent of the Daily Value of sodium. Generally, foods with less than 300 milligrams (mg) of sodium per serving fit into this eating plan. To find whole grains, look for the word "whole" as the first word in the ingredient list. Shopping Buy products labeled as "low-sodium" or "no salt added." Buy fresh foods. Avoid canned foods and pre-made or frozen  meals. Cooking Avoid adding salt when cooking. Use salt-free seasonings or herbs instead of table salt or sea salt. Check with your health care provider or pharmacist before using salt substitutes. Do not fry foods. Cook foods using healthy methods such as baking, boiling, grilling, roasting, and broiling instead. Cook with heart-healthy oils, such as olive, canola, avocado, soybean, or sunflower oil. Meal planning  Eat a balanced diet that includes: 4 or more servings of fruits and 4 or more servings of vegetables each day. Try to fill one-half of your plate with fruits and vegetables. 6-8 servings of whole grains each day. Less than 6 oz (170 g) of lean meat, poultry, or fish each day. A 3-oz (85-g) serving of meat is about the same size as a deck of cards. One egg equals 1 oz (28 g). 2-3 servings of low-fat dairy each day. One serving is 1 cup (237 mL). 1 serving of nuts, seeds, or beans 5 times each week. 2-3 servings of heart-healthy fats. Healthy fats called omega-3 fatty acids are found in foods such as walnuts, flaxseeds, fortified milks, and eggs. These fats are also found in cold-water fish, such as sardines, salmon, and mackerel. Limit how much you eat of: Canned or prepackaged foods. Food that is high in trans fat, such as some fried foods. Food that is high in saturated fat, such as fatty meat. Desserts and other sweets, sugary drinks, and other foods with added sugar. Full-fat dairy products. Do not salt foods before eating. Do not eat more than 4 egg yolks a week. Try to eat at least 2 vegetarian meals a week. Eat more home-cooked food and less restaurant, buffet, and fast food. Lifestyle When eating at a restaurant, ask that your food be  prepared with less salt or no salt, if possible. If you drink alcohol: Limit how much you use to: 0-1 drink a day for women who are not pregnant. 0-2 drinks a day for men. Be aware of how much alcohol is in your drink. In the [place] one  drink equals one 12 oz bottle of beer (355 mL), one 5 oz glass of wine (148 mL), or one 1 oz glass of hard liquor (44 mL). General information Avoid eating more than 2,300 mg of salt a day. If you have hypertension, you may need to reduce your sodium intake to 1,500 mg a day. Work with your health care provider to maintain a healthy body weight or to lose weight. Ask what an ideal weight is for you. Get at least 30 minutes of exercise that causes your heart to beat faster (aerobic exercise) most days of the week. Activities may include walking, swimming, or biking. Work with your health care provider or dietitian to adjust your eating plan to your individual calorie needs. What foods should I eat? Fruits All fresh, dried, or frozen fruit. Canned fruit in natural juice (without added sugar). Vegetables Fresh or frozen vegetables (raw, steamed, roasted, or grilled). Low-sodium or reduced-sodium tomato and vegetable juice. Low-sodium or reduced-sodium tomato sauce and tomato paste. Low-sodium or reduced-sodium canned vegetables. Grains Whole-grain or whole-wheat bread. Whole-grain or whole-wheat pasta. Brown rice. [place]. Bulgur. Whole-grain and low-sodium cereals. Pita bread. Low-fat, low-sodium crackers. Whole-wheat flour tortillas. Meats and other proteins Skinless chicken or [place]. Ground chicken or [place]. Pork with fat trimmed off. Fish and seafood. Egg whites. Dried beans, peas, or lentils. Unsalted nuts, nut butters, and seeds. Unsalted canned beans. Lean cuts of beef with fat trimmed off. Low-sodium, lean precooked or cured meat, such as sausages or meat loaves. Dairy Low-fat (1%) or fat-free (skim) milk. Reduced-fat, low-fat, or fat-free cheeses. Nonfat, low-sodium ricotta or cottage cheese. Low-fat or nonfat yogurt. Low-fat, low-sodium cheese. Fats and oils Soft margarine without trans fats. Vegetable oil. Reduced-fat, low-fat, or light mayonnaise and salad dressings  (reduced-sodium). Canola, safflower, olive, avocado, soybean, and sunflower oils. Avocado. Seasonings and condiments Herbs. Spices. Seasoning mixes without salt. Other foods Unsalted popcorn and pretzels. Fat-free sweets. The items listed above may not be a complete list of foods and beverages you can eat. Contact a dietitian for more information. What foods should I avoid? Fruits Canned fruit in a light or heavy syrup. Fried fruit. Fruit in cream or butter sauce. Vegetables Creamed or fried vegetables. Vegetables in a cheese sauce. Regular canned vegetables (not low-sodium or reduced-sodium). Regular canned tomato sauce and paste (not low-sodium or reduced-sodium). Regular tomato and vegetable juice (not low-sodium or reduced-sodium). Angie Fava. Olives. Grains Baked goods made with fat, such as croissants, muffins, or some breads. Dry pasta or rice meal packs. Meats and other proteins Fatty cuts of meat. Ribs. Fried meat. Berniece Salines. Bologna, salami, and other precooked or cured meats, such as sausages or meat loaves. Fat from the back of a pig (fatback). Bratwurst. Salted nuts and seeds. Canned beans with added salt. Canned or smoked fish. Whole eggs or egg yolks. Chicken or [place] with skin. Dairy Whole or 2% milk, cream, and half-and-half. Whole or full-fat cream cheese. Whole-fat or sweetened yogurt. Full-fat cheese. Nondairy creamers. Whipped toppings. Processed cheese and cheese spreads. Fats and oils Butter. Stick margarine. Lard. Shortening. Ghee. Bacon fat. Tropical oils, such as coconut, palm kernel, or palm oil. Seasonings and condiments Onion salt, garlic salt, seasoned salt, table salt,  and sea salt. Worcestershire sauce. Tartar sauce. Barbecue sauce. Teriyaki sauce. Soy sauce, including reduced-sodium. Steak sauce. Canned and packaged gravies. Fish sauce. Oyster sauce. Cocktail sauce. Store-bought horseradish. Ketchup. Mustard. Meat flavorings and tenderizers. Bouillon cubes. Hot sauces.  Pre-made or packaged marinades. Pre-made or packaged taco seasonings. Relishes. Regular salad dressings. Other foods Salted popcorn and pretzels. The items listed above may not be a complete list of foods and beverages you should avoid. Contact a dietitian for more information. Where to find more information National Heart, Lung, and Blood Institute: https://wilson-eaton.com/ American Heart Association: www.heart.org Academy of Nutrition and Dietetics: www.eatright.[place]: www.kidney.org Summary The DASH eating plan is a healthy eating plan that has been shown to reduce high blood pressure (hypertension). It may also reduce your risk for type 2 diabetes, heart disease, and stroke. When on the DASH eating plan, aim to eat more fresh fruits and vegetables, whole grains, lean proteins, low-fat dairy, and heart-healthy fats. With the DASH eating plan, you should limit salt (sodium) intake to 2,300 mg a day. If you have hypertension, you may need to reduce your sodium intake to 1,500 mg a day. Work with your health care provider or dietitian to adjust your eating plan to your individual calorie needs. This information is not intended to replace advice given to you by your health care provider. Make sure you discuss any questions you have with your health care provider. Document Revised: 09/05/2019 Document Reviewed: 09/05/2019 Elsevier Patient Education  2022 [employer].

## 2021-08-01 NOTE — Progress Notes (Signed)
This encounter was created in error - please disregard.

## 2021-08-01 NOTE — Chronic Care Management (AMB) (Signed)
Chronic Care Management   CCM RN Visit Note  08/01/2021 Name: Samie Reasons MRN: [id number] DOB: [birth year]  Subjective: Boleslaw Borghi is a 80 y.o. year old male who is a primary care patient of Cannady, Barbaraann Faster, NP. The care management team was consulted for assistance with disease management and care coordination needs.    Engaged with patient by telephone for initial visit in response to provider referral for case management and/or care coordination services.   Consent to Services:  The patient was given the following information about Chronic Care Management services today, agreed to services, and gave verbal consent: 1. CCM service includes personalized support from designated clinical staff supervised by the primary care provider, including individualized plan of care and coordination with other care providers 2. 24/7 contact phone numbers for assistance for urgent and routine care needs. 3. Service will only be billed when office clinical staff spend 20 minutes or more in a month to coordinate care. 4. Only one practitioner may furnish and bill the service in a calendar month. 5.The patient may stop CCM services at any time (effective at the end of the month) by phone call to the office staff. 6. The patient will be responsible for cost sharing (co-pay) of up to 20% of the service fee (after annual deductible is met). Patient agreed to services and consent obtained.  Patient agreed to services and verbal consent obtained.   Assessment: Review of patient past medical history, allergies, medications, health status, including review of consultants reports, laboratory and other test data, was performed as part of comprehensive evaluation and provision of chronic care management services.   SDOH (Social Determinants of Health) assessments and interventions performed:    CCM Care Plan  Allergies  Allergen Reactions   Lotensin [Benazepril Hcl] Swelling    Outpatient Encounter  Medications as of 08/01/2021  Medication Sig   amLODipine (NORVASC) 10 MG tablet Take 1 tablet (10 mg total) by mouth daily.   aspirin EC 81 MG tablet Take 1 tablet (81 mg total) by mouth daily.   atorvastatin (LIPITOR) 20 MG tablet Take 1 tablet (20 mg total) by mouth daily.   No facility-administered encounter medications on file as of 08/01/2021.    Patient Active Problem List   Diagnosis Date Noted   Late onset Alzheimer's dementia without behavioral disturbance ([place]) 07/11/2021   B12 deficiency 07/11/2021   Fever 07/11/2021   Hyperlipidemia, mixed    Hypertension    Gout, arthropathy     Conditions to be addressed/monitored:HTN, HLD, and Alzheimer's with Lewy body component  Care Plan : RNCM: General Plan of Care (Adult) For Chronic Disease Management and Care Coordination Needs  Updates made by Vanita Ingles, RN since 08/01/2021 12:00 AM     Problem: RNCM; Development of Plan of Care for Chronic Disease Management and Care Coordination Needs ( Alzheimer's/HTN/HLD)   Priority: High     Long-Range Goal: RNCM; Development of Plan of Care for Chronic Disease Management and Care Coordination Needs ( Alzheimer's/HTN/HLD)   Start Date: 08/01/2021  Expected End Date: 08/01/2022  Priority: High  Note:   Current Barriers:  Knowledge Deficits related to plan of care for management of HTN, HLD, and Alzheimer's with lewy body component  Care Coordination needs related to Level of care concerns  Chronic Disease Management support and education needs related to HTN, HLD, and Alzheimer's with lewy body component dementia Lacks caregiver support.  Cognitive Deficits Patient's spouse currently taking cancer treatments on Monday-Wednesday-Friday in [place]  RNCM Clinical Goal(s):  Patient will verbalize understanding of plan for management of HTN, HLD, and Alzheimer's with Lewy body component  verbalize basic understanding of HTN, HLD, and Alzheimer's with Lewy body component  disease process and self health management plan   take all medications exactly as prescribed and will call provider for medication related questions demonstrate understanding of rationale for each prescribed medication the patient is taking medications as prescribed. His wife assist in his care and makes sure he is compliant with medications.  attend all scheduled medical appointments: 08-10-2021 at 0940 am, also with neurologist on this day also demonstrate improved and ongoing adherence to prescribed treatment plan for HTN, HLD, and Alzheimer's with Lewy body component as evidenced by adherence to prescribed medication regimen contacting provider for new or worsened symptoms or questions   demonstrate improved and ongoing health management independence by remaining safe in environment and not causing injury to self or others continue to work with Consulting civil engineer and/or Social Worker to address care management and care coordination needs related to HTN, HLD, and Alzheimer's with Lewy body component  work with family and patients spouse to learn about the progression of Alzheimer's and share available resources to help with effective management of the patients chronic conditions.  demonstrate ongoing self health care management ability for effective management of chronic conditions  through collaboration with RN Care manager, provider, and care team.   Interventions: 1:1 collaboration with primary care provider regarding development and update of comprehensive plan of care as evidenced by provider attestation and co-signature Inter-disciplinary care team collaboration (see longitudinal plan of care) Evaluation of current treatment plan related to  self management and patient's adherence to plan as established by provider   SDOH Barriers (Status: New goal.)  Patient interviewed and SDOH assessment performed        Patient interviewed and appropriate assessments performed Provided patient with  information about resources available to the patient and patients family for understanding chronic conditions and effective management of chronic conditions Discussed plans with patient for ongoing care management follow up and provided patient with direct contact information for care management team Advised patient to keep appointments, call the office if appointments need to be rescheduled, call the [facility] for educational needs, questions, or concerns, and work with the CCM team to optimize health and well being Provided education to patient/caregiver regarding level of care options.    Alzheimer's with Lewy body component  (Status: New goal.) Evaluation of current treatment plan related to  Alzheimer's with Lewy Body component  , Level of care concerns and Memory Deficits self-management and patient's adherence to plan as established by provider. Discussed plans with patient for ongoing care management follow up and provided patient with direct contact information for care management team Advised patient to call the office for changes in condition, worsening memory deficits, forgettfulness in taking medications or other deviations from normal; Provided education to patient re: "the 36 hour day" resource book for insights to patients with dementia and Alzheimer's.  Also discussed safety concerns with the patients wife and daughter. The patient continues to drive and they are concerned about this but have not been able to prevent the pateint from driving. The pateint goes to meet friends at the local store daily and runs some errands. They are noticing changes in his memory. Review the concern for safety. Will continue to monitor for changes; Reviewed medications with patient and discussed compliance. The patient is compliant with medications at this  time. The patients wife was inquiring about medications that may help with the patients Alzheimer's and education provided that follow up with neuorlogist  would determine the medication plan of care for the patient .The patient has an appointment on 08-10-2021 with the neurologist; Provided patient with dementia and Alzheimer education and the 36 hour day resource educational materials related to progression process of Alzheimer's/dementia. Will print off information and mail to the patients spouse; Reviewed scheduled/upcoming provider appointments including 08-10-2021 at 0940 am; Discussed plans with patient for ongoing care management follow up and provided patient with direct contact information for care management team; Advised patient to discuss questions and concerns with provider; Screening for signs and symptoms of depression related to chronic disease state;  Assessed social determinant of health barriers;   Hyperlipidemia:  (Status: New goal.) Lab Results  Component Value Date   CHOL 216 (H) 07/11/2021   HDL 50 07/11/2021   LDLCALC 151 (H) 07/11/2021   TRIG 86 07/11/2021   CHOLHDL 3.4 02/10/2016     Medication review performed; medication list updated in electronic medical record.  Provider established cholesterol goals reviewed; Counseled on importance of regular laboratory monitoring as prescribed; Provided HLD educational materials; Reviewed role and benefits of statin for ASCVD risk reduction; Discussed strategies to manage statin-induced myalgias; Reviewed importance of limiting foods high in cholesterol;  Hypertension: (Status: New goal.) Last practice recorded BP readings:  BP Readings from Last 3 Encounters:  07/11/21 (!) 148/80  05/27/20 139/65  05/13/19 (!) 167/81  Most recent eGFR/CrCl:  Lab Results  Component Value Date   EGFR 82 07/11/2021    No components found for: CRCL  Evaluation of current treatment plan related to hypertension self management and patient's adherence to plan as established by provider;   Provided education to patient re: stroke prevention, s/s of heart attack and stroke; Reviewed  medications with patient and discussed importance of compliance;  Discussed plans with patient for ongoing care management follow up and provided patient with direct contact information for care management team; Advised patient, providing education and rationale, to monitor blood pressure daily and record, calling PCP for findings outside established parameters;  Reviewed scheduled/upcoming provider appointments including:  Provided education on prescribed diet heart healthy diet ;  Discussed complications of poorly controlled blood pressure such as heart disease, stroke, circulatory complications, vision complications, kidney impairment, sexual dysfunction;   Patient Goals/Self-Care Activities: Patient will self administer medications as prescribed as evidenced by self report/primary caregiver report  Patient will attend all scheduled provider appointments as evidenced by clinician review of documented attendance to scheduled appointments and patient/caregiver report Patient will call pharmacy for medication refills as evidenced by patient report and review of pharmacy fill history as appropriate Patient will attend church or other social activities as evidenced by patient report Patient will continue to perform ADL's independently as evidenced by patient/caregiver report Patient will continue to perform IADL's independently as evidenced by patient/caregiver report Patient will call provider office for new concerns or questions as evidenced by review of documented incoming telephone call notes and patient report Patient will work with BSW to address care coordination needs and will continue to work with the clinical team to address health care and disease management related needs as evidenced by documented adherence to scheduled care management/care coordination appointments - check blood pressure weekly, - choose a place to take my blood pressure (home, clinic or office, retail store), - write  blood pressure results in a log or diary, - learn about  high blood pressure, - take blood pressure log to all doctor appointments, - call doctor for signs and symptoms of high blood pressure, - develop an action plan for high blood pressure, - keep all doctor appointments, - take medications for blood pressure exactly as prescribed, - report new symptoms to your doctor, and - eat more whole grains, fruits and vegetables, lean meats and healthy fats - call for medicine refill 2 or 3 days before it runs out, - take all medications exactly as prescribed, - call doctor with any symptoms you believe are related to your medicine, - call doctor when you experience any new symptoms, - go to all doctor appointments as scheduled, and - adhere to prescribed diet: heart healthy diet       Plan:Telephone follow up appointment with care management team member scheduled for:  08-23-2021 at 345 pm  [place], MSN, [facility] Mobile: [phone]

## 2021-08-02 ENCOUNTER — Telehealth: Payer: Self-pay | Admitting: Nurse Practitioner

## 2021-08-02 NOTE — Telephone Encounter (Signed)
Attempted to reach patient again to offer to schedule a Palliative Consult with no answer - left message requesting a return call to let us know if they wish to schedule a visit or if they do not want Palliative services, left my name and call back number.    I also attempted to contact patient's stepdaughter, Aviva Kluver (listed in Epic as a contact person for patient) and left her a message letting her know that we have been trying to reach patient to schedule a visit with [place] - I also had to leave her a message and I left my name and contact information.

## 2021-08-03 ENCOUNTER — Ambulatory Visit: Payer: Self-pay | Admitting: Licensed Clinical Social Worker

## 2021-08-03 DIAGNOSIS — F0280 Dementia in other diseases classified elsewhere without behavioral disturbance: Secondary | ICD-10-CM

## 2021-08-03 DIAGNOSIS — E782 Mixed hyperlipidemia: Secondary | ICD-10-CM

## 2021-08-03 DIAGNOSIS — I10 Essential (primary) hypertension: Secondary | ICD-10-CM

## 2021-08-03 NOTE — Chronic Care Management (AMB) (Signed)
  Care Management  Collaboration  Note  08/03/2021 Name: Larry Buckley MRN: [id number] DOB: [birth year]  Larry Buckley is a 80 y.o. year old male who is a primary care patient of Cannady, Dorie Rank, NP. The CCM team was consulted reference care coordination needs for Caregiver Stress.  Assessment: Patient was not interviewed or contacted during this encounter. See Care Plan or interventions for patient self-care actives.   Intervention:Conducted brief assessment, recommendations and relevant information discussed. CCM LCSW collaborated with CCM RNCM .    Follow up Plan: CCM LCSW will continue to collaborate with CCM team and PCP in order to meet patient's needs .   Review of patient past medical history, allergies, medications, and health status, including review of pertinent consultant reports was performed as part of comprehensive evaluation and provision of care management/care coordination services.   Care Plan Conditions to be addressed/monitored per PCP order: Dementia, Memory Deficits   There are no care plans that you recently modified to display for this patient.  Jenel Lucks, MSW, LCSW [facility] Care Management [place]  [email or web address] Phone [phone] 2:35 PM

## 2021-08-04 ENCOUNTER — Telehealth: Payer: Self-pay | Admitting: Nurse Practitioner

## 2021-08-04 NOTE — Telephone Encounter (Signed)
Rec'd return call from patient's stepdaughter, Aviva Kluver and also patient's wife [name], they had me on a conference call with them.  After discussing Palliative services with them and all questions were answered they were in agreement with scheduling visit.  I have scheduled an In-home Consult for 08/31/21 @ 12:30 PM.

## 2021-08-04 NOTE — Chronic Care Management (AMB) (Signed)
  Care Management   Note  08/04/2021 Name: [name] Reason MRN: [id number] DOB: 1941/01/15  Larry Buckley is a 80 y.o. year old male who is a primary care patient of Marjie Skiff, NP and is actively engaged with the care management team. I reached out to Antoine Poche by phone today to assist with re-scheduling an initial visit with the RN Case Manager and Licensed Clinical Social Worker  Follow up plan: Telephone appointment with care management team member scheduled PPJ:KDTO call on 07/22/21 and RNCM on 08/01/21  [facility] Guide, Embedded Care Coordination [facility]  Care Management  Direct Dial: [phone]

## 2021-08-08 DIAGNOSIS — G309 Alzheimer's disease, unspecified: Secondary | ICD-10-CM | POA: Diagnosis not present

## 2021-08-08 DIAGNOSIS — F0280 Dementia in other diseases classified elsewhere without behavioral disturbance: Secondary | ICD-10-CM | POA: Diagnosis not present

## 2021-08-08 DIAGNOSIS — Z9889 Other specified postprocedural states: Secondary | ICD-10-CM | POA: Diagnosis not present

## 2021-08-08 DIAGNOSIS — F0150 Vascular dementia without behavioral disturbance: Secondary | ICD-10-CM | POA: Diagnosis not present

## 2021-08-10 ENCOUNTER — Ambulatory Visit: Payer: BC Managed Care – PPO | Admitting: Nurse Practitioner

## 2021-08-15 DIAGNOSIS — G301 Alzheimer's disease with late onset: Secondary | ICD-10-CM

## 2021-08-15 DIAGNOSIS — I10 Essential (primary) hypertension: Secondary | ICD-10-CM

## 2021-08-15 DIAGNOSIS — F0280 Dementia in other diseases classified elsewhere without behavioral disturbance: Secondary | ICD-10-CM | POA: Diagnosis not present

## 2021-08-15 DIAGNOSIS — E782 Mixed hyperlipidemia: Secondary | ICD-10-CM

## 2021-08-23 ENCOUNTER — Telehealth: Payer: Self-pay

## 2021-08-23 ENCOUNTER — Telehealth: Payer: BC Managed Care – PPO

## 2021-08-23 NOTE — Telephone Encounter (Signed)
  Care Management   Follow Up Note   08/23/2021 Name: Larry Buckley MRN: [id number] DOB: 03-18-1941   Referred by: Marjie Skiff, NP Reason for referral : Chronic Care Management (RNCM: Follow up for Chronic Disease Management and Care Coordination Needs, The patients wife Zacory Fiola answered the phone but states she is in the hospital- will follow up with the patient at his follow up appointment with pcp on 09-06-2021)   An unsuccessful telephone outreach was attempted today. The patient was referred to the case management team for assistance with care management and care coordination. The patients wife answered the phone but the states her battery is low and she is also in the hospital. She could not complete the scheduled outreach today.   Follow Up Plan: Face to Face appointment with care management team member scheduled for:  09-06-2021 at 140 pm when patient is here to see the pcp.  Alto Denver RN, MSN, CCM Community Care Coordinator [place]  [facility] Mobile: [phone]

## 2021-08-31 ENCOUNTER — Other Ambulatory Visit: Payer: Self-pay

## 2021-08-31 ENCOUNTER — Other Ambulatory Visit: Payer: Medicare HMO | Admitting: Nurse Practitioner

## 2021-08-31 ENCOUNTER — Telehealth: Payer: Self-pay | Admitting: Nurse Practitioner

## 2021-08-31 NOTE — Telephone Encounter (Signed)
I called Mrs Reinoso, message left. I called [name], Mr. Brennen step-daughter, message left. [name] returned call and requested to reschedule as Mrs. Renfroe was not feeling well, had a recent fall. Rescheduled per request

## 2021-09-06 ENCOUNTER — Telehealth: Payer: BC Managed Care – PPO

## 2021-09-06 ENCOUNTER — Ambulatory Visit: Payer: BC Managed Care – PPO | Admitting: Nurse Practitioner

## 2021-09-16 ENCOUNTER — Telehealth: Payer: Self-pay | Admitting: Nurse Practitioner

## 2021-09-16 NOTE — Telephone Encounter (Signed)
Copied from CRM [phone]. Topic: Medicare AWV >> Sep 16, 2021  2:25 PM Leigh Aurora wrote: Reason for CRM:  Left message for patient to call back and schedule Medicare Annual Wellness Visit (AWV) to be done virtually or by telephone.  No hx of AWV eligible as of 03/16/13  Please schedule at anytime with CFP-Nurse Health Advisor.      45 Minutes appointment   Any questions, please call me at [phone]

## 2021-09-20 ENCOUNTER — Other Ambulatory Visit: Payer: Medicare HMO | Admitting: Nurse Practitioner

## 2021-09-20 ENCOUNTER — Other Ambulatory Visit: Payer: Self-pay

## 2021-09-20 ENCOUNTER — Encounter: Payer: Self-pay | Admitting: Nurse Practitioner

## 2021-09-20 DIAGNOSIS — R630 Anorexia: Secondary | ICD-10-CM | POA: Diagnosis not present

## 2021-09-20 DIAGNOSIS — Z515 Encounter for palliative care: Secondary | ICD-10-CM | POA: Diagnosis not present

## 2021-09-20 DIAGNOSIS — F0280 Dementia in other diseases classified elsewhere without behavioral disturbance: Secondary | ICD-10-CM | POA: Diagnosis not present

## 2021-09-20 DIAGNOSIS — R413 Other amnesia: Secondary | ICD-10-CM

## 2021-09-20 DIAGNOSIS — G301 Alzheimer's disease with late onset: Secondary | ICD-10-CM | POA: Diagnosis not present
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
## 2021-09-22 NOTE — Progress Notes (Signed)
Pt is scheduled for 12/13 at 8 am

## 2021-09-23 ENCOUNTER — Telehealth: Payer: Self-pay

## 2021-09-23 NOTE — Telephone Encounter (Signed)
09/23/21 @ 9:54 AM: PC SW outreached patients daughter, [name], per [facility] NP - C. Gusler, request to assess patient needs and possibly schedule in home visit.   Call unsuccessful. SW LVM. Awaiting return call.

## 2021-09-23 NOTE — Telephone Encounter (Signed)
09/23/21 @10  AM: INCOMING CALL : patients daughter returned SW call.   In person visit scheduled with daughter for patient Larry Buckley 09/26/21 @1pm  with PC RN/SW

## 2021-09-25 DIAGNOSIS — Z9889 Other specified postprocedural states: Secondary | ICD-10-CM | POA: Insufficient documentation

## 2021-09-26 ENCOUNTER — Other Ambulatory Visit: Payer: Self-pay

## 2021-09-26 ENCOUNTER — Other Ambulatory Visit: Payer: Medicare HMO

## 2021-09-26 VITALS — BP 160/72 | HR 79 | Temp 97.2°F

## 2021-09-26 DIAGNOSIS — Z515 Encounter for palliative care: Secondary | ICD-10-CM
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
## 2021-09-27 ENCOUNTER — Encounter: Payer: Self-pay | Admitting: Nurse Practitioner

## 2021-09-27 ENCOUNTER — Other Ambulatory Visit: Payer: Self-pay

## 2021-09-27 ENCOUNTER — Ambulatory Visit (INDEPENDENT_AMBULATORY_CARE_PROVIDER_SITE_OTHER): Payer: Medicare HMO | Admitting: Nurse Practitioner

## 2021-09-27 VITALS — BP 138/72 | Temp 98.7°F | Wt 179.2 lb

## 2021-09-27 DIAGNOSIS — M109 Gout, unspecified: Secondary | ICD-10-CM

## 2021-09-27 DIAGNOSIS — F02818 Dementia in other diseases classified elsewhere, unspecified severity, with other behavioral disturbance: Secondary | ICD-10-CM

## 2021-09-27 DIAGNOSIS — F0280 Dementia in other diseases classified elsewhere without behavioral disturbance: Secondary | ICD-10-CM

## 2021-09-27 DIAGNOSIS — E538 Deficiency of other specified B group vitamins: Secondary | ICD-10-CM | POA: Diagnosis not present

## 2021-09-27 DIAGNOSIS — Z9889 Other specified postprocedural states: Secondary | ICD-10-CM

## 2021-09-27 DIAGNOSIS — F0150 Vascular dementia without behavioral disturbance: Secondary | ICD-10-CM

## 2021-09-27 DIAGNOSIS — E782 Mixed hyperlipidemia: Secondary | ICD-10-CM

## 2021-09-27 DIAGNOSIS — G309 Alzheimer's disease, unspecified: Secondary | ICD-10-CM

## 2021-09-27 DIAGNOSIS — I10 Essential (primary) hypertension: Secondary | ICD-10-CM

## 2021-09-27 NOTE — Patient Instructions (Signed)
Dementia Caregiver Guide °Dementia is a term used to describe a number of symptoms that affect memory and thinking. The most common symptoms include: °Memory loss. °Trouble with language and communication. °Trouble concentrating. °Poor judgment and problems with reasoning. °Wandering from home or public places. °Extreme anxiety or depression. °Being suspicious or having angry outbursts and accusations. °Child-like behavior and language. °Dementia can be frightening and confusing. And taking care of someone with dementia can be challenging. This guide provides tips to help you when providing care for a person with dementia. °How to help manage lifestyle changes °Dementia usually gets worse slowly over time. In the early stages, people with dementia can stay independent and safe with some help. In later stages, they need help with daily tasks such as dressing, grooming, and using the bathroom. There are actions you can take to help a person manage his or her life while living with this condition. °Communicating °When the person is talking or seems frustrated, make eye contact and hold the person's hand. °Ask specific questions that need yes or no answers. °Use simple words, short sentences, and a calm voice. Only give one direction at a time. °When offering choices, limit the person to just one or two. °Avoid correcting the person in a negative way. °If the person is struggling to find the right words, gently try to help him or her. °Preventing injury ° °Keep floors clear of clutter. Remove rugs, magazine racks, and floor lamps. °Keep hallways well lit, especially at night. °Put a handrail and nonslip mat in the bathtub or shower. °Put childproof locks on cabinets that contain dangerous items, such as medicines, alcohol, guns, toxic cleaning items, sharp tools or utensils, matches, and lighters. °For doors to the outside of the house, put the locks in places where the person cannot see or reach them easily. This will  help ensure that the person does not wander out of the house and get lost. °Be prepared for emergencies. Keep a list of emergency phone numbers and addresses in a convenient area. °Remove car keys and lock garage doors so that the person does not try to get in the car and drive. °Have the person wear a bracelet that tracks locations and identifies the person as having memory problems. This should be worn at all times for safety. °Helping with daily life ° °Keep the person on track with his or her routine. °Try to identify areas where the person may need help. °Be supportive, patient, calm, and encouraging. °Gently remind the person that adjusting to changes takes time. °Help with the tasks that the person has asked for help with. °Keep the person involved in daily tasks and decisions as much as possible. °Encourage conversation, but try not to get frustrated if the person struggles to find words or does not seem to appreciate your help. °How to recognize stress °Look for signs of stress in yourself and in the person you are caring for. If you notice signs of stress, take steps to manage it. Symptoms of stress include: °Feeling anxious, irritable, frustrated, or angry. °Denying that the person has dementia or that his or her symptoms will not improve. °Feeling depressed, hopeless, or unappreciated. °Difficulty sleeping. °Difficulty concentrating. °Developing stress-related health problems. °Feeling like you have too little time for your own life. °Follow these instructions at home: °Take care of your health °Make sure that you and the person you are caring for: °Get regular sleep. °Exercise regularly. °Eat regular, nutritious meals. °Take over-the-counter and prescription medicines only   as told by your health care providers. °Drink enough fluid to keep your urine pale yellow. °Attend all scheduled health care appointments. ° °General instructions °Join a support group with others who are caregivers. °Ask about  respite care resources. Respite care can provide short-term care for the person so that you can have a regular break from the stress of caregiving. °Consider any safety risks and take steps to avoid them. °Organize medicines in a pill box for each day of the week. °Create a plan to handle any legal or financial matters. Get legal or financial advice if needed. °Keep a calendar in a central location to remind the person of appointments or other activities. °Where to find support: °Many individuals and organizations offer support. These include: °Support groups for people with dementia. °Support groups for caregivers. °Counselors or therapists. °Home health care services. °Adult day care centers. °Where to find more information °Centers for Disease Control and Prevention: www.cdc.gov °Alzheimer's Association: www.alz.org °Family Caregiver Alliance: www.caregiver.org °Alzheimer's Foundation of [place]: www.alzfdn.org °Contact a health care provider if: °The person's health is rapidly getting worse. °You are no longer able to care for the person. °Caring for the person is affecting your physical and emotional health. °You are feeling depressed or anxious about caring for the person. °Get help right away if: °The person threatens himself or herself, you, or anyone else. °You feel depressed or sad, or feel that you want to harm yourself. °If you ever feel like your loved one may hurt himself or herself or others, or if he or she shares thoughts about taking his or her own life, get help right away. You can go to your nearest emergency department or: °Call your local emergency services (911 in the [place] °Call a suicide crisis helpline, such as the National Suicide Prevention Lifeline at 1-800-273-8255 or 988 in the [place] This is open 24 hours a day in the [place] °Text the Crisis Text Line at 741741 (in the [place] °Summary °Dementia is a term used to describe a number of symptoms that affect memory and thinking. °Dementia  usually gets worse slowly over time. °Take steps to reduce the person's risk of injury and to plan for future care. °Caregivers need support, relief from caregiving, and time for their own lives. °This information is not intended to replace advice given to you by your health care provider. Make sure you discuss any questions you have with your health care provider. °Document Revised: 04/27/2021 Document Reviewed: 02/16/2020 °Elsevier Patient Education © 2022 Elsevier Inc. ° °

## 2021-09-27 NOTE — Assessment & Plan Note (Signed)
In 2017 -- AAA repair.  Continue statin daily and BP control at home.

## 2021-09-27 NOTE — Assessment & Plan Note (Signed)
History of gout, will check uric acid today and initiate medication as needed.

## 2021-09-27 NOTE — Assessment & Plan Note (Signed)
Chronic, progressive, with major concerns for driving reported by family and palliative NP.  Followed by neurology with recent visit.  No current memory medications.  Check B12 level today.  Lots of education needed on disease process -- have recommend patient not drive vehicle at anytime, keys should be hidden, as concern for patient getting lost or in accident. Will send paperwork to [facility] to request for driver re-examination due to concerns.  Continue collaboration with CCM and palliative NP.  Discussed with family today that patient may benefit from assisted living and if home alone recommend alert system on patient to ensure if he wanders he is found == also recommend an aide in home when patient alone.  Return in 6 weeks.

## 2021-09-27 NOTE — Assessment & Plan Note (Signed)
Chronic, ongoing.  BP elevated on initial check today, but repeat at goal for age.  Recommend he monitor BP at least a few mornings a week at home and document.  DASH diet at home.  Continue current medication regimen and adjust as needed, recommend pill packs for medications at home.  Labs today: CMP.  Return in 6 weeks.
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
## 2021-09-27 NOTE — Assessment & Plan Note (Signed)
Chronic, ongoing.  Continue current medication regimen and adjust as needed.  Recommend use of pill container at home.  Palliative and CCM collaboration continues.

## 2021-09-27 NOTE — Assessment & Plan Note (Signed)
Chronic, ongoing.  Recheck B12 level today and continue daily oral supplement, discussed with wife.

## 2021-09-28 LAB — LIPID PANEL W/O CHOL/HDL RATIO
Cholesterol, Total: 163 mg/dL (ref 100–199)
HDL: 53 mg/dL (ref >39)
LDL Chol Calc (NIH): 100 mg/dL — ABNORMAL HIGH (ref 0–99)
Triglycerides: 51 mg/dL (ref 0–149)
VLDL Cholesterol Cal: 10 mg/dL (ref 5–40)

## 2021-09-28 LAB — COMPREHENSIVE METABOLIC PANEL
ALT: 8 IU/L (ref 0–44)
AST: 15 IU/L (ref 0–40)
Albumin/Globulin Ratio: 1.6 (ref 1.2–2.2)
Albumin: 4.2 g/dL (ref 3.6–4.6)
Alkaline Phosphatase: 72 IU/L (ref 44–121)
BUN/Creatinine Ratio: 13 (ref 10–24)
BUN: 12 mg/dL (ref 8–27)
Bilirubin Total: 0.7 mg/dL (ref 0.0–1.2)
CO2: 25 mmol/L (ref 20–29)
Calcium: 9.1 mg/dL (ref 8.6–10.2)
Chloride: 102 mmol/L (ref 96–106)
Creatinine, Ser: 0.95 mg/dL (ref 0.76–1.27)
Globulin, Total: 2.7 g/dL (ref 1.5–4.5)
Glucose: 111 mg/dL — ABNORMAL HIGH (ref 70–99)
Potassium: 4.3 mmol/L (ref 3.5–5.2)
Sodium: 140 mmol/L (ref 134–144)
Total Protein: 6.9 g/dL (ref 6.0–8.5)
eGFR: 80 mL/min/{1.73_m2} (ref >59)

## 2021-09-28 LAB — URIC ACID: Uric Acid: 6.2 mg/dL (ref 3.8–8.4)

## 2021-09-28 LAB — VITAMIN B12: Vitamin B-12: 1123 pg/mL (ref 232–1245)

## 2021-09-28 NOTE — Progress Notes (Signed)
Good afternoon, please let Mr. Nosal family know his labs have returned and are overall stable with exception of mild elevation in cholesterol, continue Atorvastatin daily.  I have sent form to [facility] and they may hear from them over the next weeks.  Any questions?  I alerted Christin, palliative NP, to concerns too and she is going to further discuss with Child psychotherapist. Keep being amazing!!  Thank you for allowing me to participate in your care.  I appreciate you. Kindest regards, Lorrane Mccay

## 2021-09-29 DIAGNOSIS — Z515 Encounter for palliative care: Secondary | ICD-10-CM
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
## 2021-09-30 ENCOUNTER — Ambulatory Visit (INDEPENDENT_AMBULATORY_CARE_PROVIDER_SITE_OTHER): Payer: BC Managed Care – PPO | Admitting: Licensed Clinical Social Worker

## 2021-09-30 DIAGNOSIS — G309 Alzheimer's disease, unspecified: Secondary | ICD-10-CM

## 2021-09-30 DIAGNOSIS — I10 Essential (primary) hypertension: Secondary | ICD-10-CM

## 2021-09-30 DIAGNOSIS — E782 Mixed hyperlipidemia: Secondary | ICD-10-CM

## 2021-10-04 ENCOUNTER — Telehealth: Payer: Self-pay | Admitting: *Deleted

## 2021-10-04 NOTE — Patient Instructions (Signed)
Visit Information  Thank you for taking time to visit with me today. Please don't hesitate to contact me if I can be of assistance to you before our next scheduled telephone appointment.  Following are the goals we discussed today:  Patient Goals/Self-Care Activities: Over the next 120 days Continue with compliance of taking medication  Increase healthy habits and continue utilizing coping skills Contact PCP office with any questions or concerns Attend all scheduled appts with providers  Our next appointment is by telephone on 10/21/21   Please call the care guide team at [phone] if you need to cancel or reschedule your appointment.   If you are experiencing a Mental Health or Behavioral Health Crisis or need someone to talk to, please call 911   Patient verbalizes understanding of instructions provided today   Jenel Lucks, MSW, LCSW [facility] [place]   [facility] [email or web address] Phone [phone] 12:33 PM

## 2021-10-04 NOTE — Chronic Care Management (AMB) (Signed)
Chronic Care Management    Clinical Social Work Note  10/04/2021 Name: Larry Buckley MRN: [id number] DOB: 07-24-1941  Larry Buckley is a 80 y.o. year old male who is a primary care patient of Cannady, Larry Rank, NP. The CCM team was consulted to assist the patient with chronic disease management and/or care coordination needs related to: Mental Health Counseling and Resources and Caregiver Stress.   Engaged with patient's step-daughter by telephone for follow up visit in response to provider referral for social work chronic care management and care coordination services.   Consent to Services:  The patient was given information about Chronic Care Management services, agreed to services, and gave verbal consent prior to initiation of services.  Please see initial visit note for detailed documentation.   Patient agreed to services and consent obtained.   Consent to Services:  The patient was given information about Care Management services, agreed to services, and gave verbal consent prior to initiation of services.  Please see initial visit note for detailed documentation.   Patient agreed to services today and consent obtained.  Engaged with patient's step-daughter by phone in response to provider referral for social work care coordination services:  Assessment/Interventions:  Patient continues to maintain positive progress with care plan goals. Larry Buckley spoke with Larry Buckley and followed up with Larry Buckley, Pallative Care LCSW. She has picked up pt's FL2 from PCP office. Larry Buckley plans on taking patient to visit spouse today. She will contact pt's adult sons to discuss pt's healthcare needs this weekend, including her interest in becoming pt's POA/Guardian. CCM LCSW provided Larry Buckley with information regarding Long Care Treatment via email See Care Plan below for interventions and patient self-care actives.  Recent life changes or stressors: Management of health conditions,  placement  Recommendation: Patient may benefit from, and is in agreement work with LCSW to address care coordination needs and will continue to work with the clinical team to address health care and disease management related needs.   Follow up Plan: Patient would like continued follow-up from CCM LCSW.  per patient's request will follow up in 10/21/21.  Will call office if needed prior to next encounter.   SDOH (Social Determinants of Health) assessments and interventions performed:    Advanced Directives Status: Not addressed in this encounter.  CCM Care Plan  Allergies  Allergen Reactions   Lotensin [Benazepril Hcl] Swelling    Outpatient Encounter Medications as of 09/30/2021  Medication Sig   amLODipine (NORVASC) 10 MG tablet Take 1 tablet (10 mg total) by mouth daily.   aspirin EC 81 MG tablet Take 1 tablet (81 mg total) by mouth daily.   atorvastatin (LIPITOR) 20 MG tablet Take 1 tablet (20 mg total) by mouth daily.   QUEtiapine (SEROQUEL) 25 MG tablet Take by mouth.   QUEtiapine (SEROQUEL) 50 MG tablet Take by mouth.   No facility-administered encounter medications on file as of 09/30/2021.    Patient Active Problem List   Diagnosis Date Noted   History of abdominal aortic aneurysm (AAA) repair 09/25/2021   Mixed dementia (HCC) 07/11/2021   B12 deficiency 07/11/2021   Hyperlipidemia, mixed    Hypertension    Gout, arthropathy     Conditions to be addressed/monitored: Dementia  Care Plan : LCSW Plan of Care  Updates made by Larry Larsson, LCSW since 10/04/2021 12:00 AM     Problem: Dementia Management and Support (General Plan of Care)   Priority: Medium  Onset Date: 07/22/2021  Long-Range Goal: Dementia Management   Start Date: 07/22/2021  This Visit's Progress: On track  Recent Progress: On track  Priority: Medium  Note:   Current barriers:    Memory Deficits Clinical Goals: Patient will work with CCM LCSW to address needs related to management of  dementia Clinical Interventions:  Assessment of needs and barriers to care All of the hx was provided by patient's spouse, Larry Buckley Patient can stay home independently while spouse completes medical treatments and/or appointments. He can stay at home. Family receives strong support from adult daughters and neighbors 12/16: Patient's spouse is currently hospitalized resulting in pt being home alone for majority of the day. Pt's step-daughter, Larry Buckley, has been assisting in his care needs. Larry Buckley spoke with Larry Buckley and followed up with Larry Buckley, Pallative Care LCSW. She has picked up pt's FL2 from PCP office. Larry Buckley plans on taking patient to visit spouse today. She will contact pt's adult sons to discuss pt's healthcare needs this weekend, including her interest in becoming pt's POA/Guardian. CCM LCSW provided Larry Buckley with information regarding Long Care Treatment via email Patient completes ADL's independently. Denies appetite or sleep concerns Patient is not experiencing depression or anxiety symptoms. Spouse and patient are not in need of therapy or supportive resources, at this time Spouse reports that they no longer allow patient to drive a vehicle. He does not have access to keys and is reported to have a calm temperament majority of the time  CCM LCSW reviewed upcoming appointments CCM LCSW collaborated with CCM RN and PCP regarding patient needs and the additional support family is receiving through Palliative Care Depression screen reviewed  Active listening / Reflection utilized  Emotional Support Provided Provided psychoeducation for mental health needs  Quality of sleep assessed & Sleep Hygiene techniques promoted  Verbalization of feelings encouraged  Review various resources, discussed options and provided patient information about  Dementia resources and support  1:1 collaboration with primary care provider regarding development and update of comprehensive plan of care as evidenced by provider  attestation and co-signature Inter-disciplinary care team collaboration (see longitudinal plan of care) Patient Goals/Self-Care Activities: Over the next 120 days Continue with compliance of taking medication  Increase healthy habits and continue utilizing coping skills Contact PCP office with any questions or concerns Attend all scheduled appts with providers         Jenel Lucks, MSW, LCSW [facility] Care [facility] [place]   [facility] [email or web address] Phone [phone] 12:29 PM

## 2021-10-04 NOTE — Telephone Encounter (Signed)
Result note read to pt's daughter [name]. Verbalizes understanding. No additional questions or concerns.

## 2021-10-07 ENCOUNTER — Ambulatory Visit: Payer: Self-pay | Admitting: Licensed Clinical Social Worker

## 2021-10-07 DIAGNOSIS — I10 Essential (primary) hypertension: Secondary | ICD-10-CM

## 2021-10-07 DIAGNOSIS — G309 Alzheimer's disease, unspecified: Secondary | ICD-10-CM

## 2021-10-07 DIAGNOSIS — E782 Mixed hyperlipidemia: Secondary | ICD-10-CM

## 2021-10-12 NOTE — Patient Instructions (Signed)
Visit Information  Thank you for taking time to visit with me today. Please don't hesitate to contact me if I can be of assistance to you before our next scheduled telephone appointment.  Following are the goals we discussed today:  Patient Goals/Self-Care Activities: Over the next 120 days Continue with compliance of taking medication  Increase healthy habits and continue utilizing coping skills Contact PCP office with any questions or concerns Attend all scheduled appts with providers  Our next appointment is by telephone on 10/21/21   Please call the care guide team at [phone] if you need to cancel or reschedule your appointment.   If you are experiencing a Mental Health or Behavioral Health Crisis or need someone to talk to, please call 911   Patient's stepdaughter verbalizes understanding of instructions provided today  Jenel Lucks, MSW, LCSW [facility] Care [facility] [place]   [email or web address] Phone [phone] 8:19 AM

## 2021-10-12 NOTE — Chronic Care Management (AMB) (Signed)
Chronic Care Management    Clinical Social Work Note  10/12/2021 Name: Larry Buckley MRN: [id number] DOB: [birth year]  Larry Buckley is a 80 y.o. year old male who is a primary care patient of Cannady, Dorie Rank, NP. The CCM team was consulted to assist the patient with chronic disease management and/or care coordination needs related to: Mental Health Counseling and Resources and Caregiver Stress.   Engaged with patient's step daughter, [name], by telephone for follow up visit in response to provider referral for social work chronic care management and care coordination services.   Consent to Services:  The patient was given information about Chronic Care Management services, agreed to services, and gave verbal consent prior to initiation of services.  Please see initial visit note for detailed documentation.   Patient agreed to services and consent obtained.   Consent to Services:  The patient was given information about Care Management services, agreed to services, and gave verbal consent prior to initiation of services.  Please see initial visit note for detailed documentation.   Patient agreed to services today and consent obtained.  Engaged with patient's step-daughter by phone in response to provider referral for social work care coordination services:  Assessment/Interventions: [name] received approval from eldest son to become pt's POA; however, youngest son has disrupted care by reminding pt of not having his vehicle, which in turn resulted in patient becoming hyper-focused on driving. [name] plans on dropping patient to his oldest son's residence, to provide care for the time being.  See Care Plan below for interventions and patient self-care activities.  Recent life changes or stressors: Caregiver Strain  Recommendation: Patient may benefit from, and is in agreement work with LCSW to address care coordination needs and will continue to work with the clinical team to address  health care and disease management related needs.  Follow up Plan: Patient would like continued follow-up from CCM LCSW.  per patient's request will follow up in 10/21/21.  Will call office if needed prior to next encounter.   SDOH (Social Determinants of Health) assessments and interventions performed:    Advanced Directives Status: [name] has received consent to become patient's POA  CCM Care Plan  Allergies  Allergen Reactions   Lotensin [Benazepril Hcl] Swelling    Outpatient Encounter Medications as of 10/07/2021  Medication Sig   amLODipine (NORVASC) 10 MG tablet Take 1 tablet (10 mg total) by mouth daily.   aspirin EC 81 MG tablet Take 1 tablet (81 mg total) by mouth daily.   atorvastatin (LIPITOR) 20 MG tablet Take 1 tablet (20 mg total) by mouth daily.   QUEtiapine (SEROQUEL) 25 MG tablet Take by mouth.   QUEtiapine (SEROQUEL) 50 MG tablet Take by mouth.   No facility-administered encounter medications on file as of 10/07/2021.    Patient Active Problem List   Diagnosis Date Noted   History of abdominal aortic aneurysm (AAA) repair 09/25/2021   Mixed dementia (HCC) 07/11/2021   B12 deficiency 07/11/2021   Hyperlipidemia, mixed    Hypertension    Gout, arthropathy     Conditions to be addressed/monitored: Dementia  Care Plan : LCSW Plan of Care  Updates made by Bridgett Larsson, LCSW since 10/12/2021 12:00 AM     Problem: Dementia Management and Support (General Plan of Care)   Priority: Medium  Onset Date: 07/22/2021     Long-Range Goal: Dementia Management   Start Date: 07/22/2021  This Visit's Progress: On track  Recent Progress: On track  Priority: Medium  Note:   Current barriers:    Memory Deficits Clinical Goals: Patient will work with CCM LCSW to address needs related to management of dementia Clinical Interventions:  Assessment of needs and barriers to care All of the hx was provided by patient's spouse, [name] Patient can stay home  independently while spouse completes medical treatments and/or appointments. He can stay at home. Family receives strong support from adult daughters and neighbors 12/16: Patient's spouse is currently hospitalized resulting in pt being home alone for majority of the day. Pt's step-daughter, [name], has been assisting in his care needs. [name] spoke with [name] and followed up with [name], Pallative Care LCSW. She has picked up pt's FL2 from PCP office. [name] plans on taking patient to visit spouse today. She will contact pt's adult sons to discuss pt's healthcare needs this weekend, including her interest in becoming pt's POA/Guardian. CCM LCSW provided [name] with information regarding Long Care Treatment via email 12/23: [name] received approval from eldest son to become pt's POA; however, youngest son has disrupted care by reminding pt of not having his vehicle, which in turn resulted in patient becoming hyper-focused on driving. [name] plans on dropping patient to his oldest son's residence, to provide care for the time being Patient completes ADL's independently. Denies appetite or sleep concerns Patient is not experiencing depression or anxiety symptoms. Spouse and patient are not in need of therapy or supportive resources, at this time Spouse reports that they no longer allow patient to drive a vehicle. He does not have access to keys and is reported to have a calm temperament majority of the time 12/22: [name] utilizes home monitoring system to keep tabs on patient, while she is away from the home CCM LCSW reviewed upcoming appointments CCM LCSW collaborated with CCM RN and PCP regarding patient needs and the additional support family is receiving through Palliative Care Depression screen reviewed  Active listening / Reflection utilized  Industrial/product designer Provided Provided psychoeducation for mental health needs  Quality of sleep assessed & Sleep Hygiene techniques promoted  Verbalization of feelings  encouraged  Review various resources, discussed options and provided patient information about  Dementia resources and support  1:1 collaboration with primary care provider regarding development and update of comprehensive plan of care as evidenced by provider attestation and co-signature Inter-disciplinary care team collaboration (see longitudinal plan of care) Patient Goals/Self-Care Activities: Over the next 120 days Continue with compliance of taking medication  Increase healthy habits and continue utilizing coping skills Contact PCP office with any questions or concerns Attend all scheduled appts with providers      Jenel Lucks, MSW, LCSW [facility] [place]   [facility] [email or web address] Phone [phone] 8:18 AM

## 2021-10-15 DIAGNOSIS — I10 Essential (primary) hypertension: Secondary | ICD-10-CM

## 2021-10-15 DIAGNOSIS — F0280 Dementia in other diseases classified elsewhere without behavioral disturbance: Secondary | ICD-10-CM

## 2021-10-15 DIAGNOSIS — G309 Alzheimer's disease, unspecified: Secondary | ICD-10-CM

## 2021-10-15 DIAGNOSIS — F0150 Vascular dementia without behavioral disturbance: Secondary | ICD-10-CM

## 2021-10-15 DIAGNOSIS — E782 Mixed hyperlipidemia: Secondary | ICD-10-CM

## 2021-10-18 ENCOUNTER — Other Ambulatory Visit: Payer: Self-pay

## 2021-10-18 ENCOUNTER — Other Ambulatory Visit: Payer: Medicare PPO

## 2021-10-18 DIAGNOSIS — Z515 Encounter for palliative care: Secondary | ICD-10-CM

## 2021-10-18 NOTE — Progress Notes (Signed)
COMMUNITY PALLIATIVE CARE SW NOTE  PATIENT NAME: Larry Buckley DOB: 1941-07-26 MRN: [id number]  PRIMARY CARE PROVIDER: Venita Lick, NP  RESPONSIBLE PARTY:  Acct ID - Guarantor Home Phone Work Phone Relationship Acct Type  [id number] AASIM, [phone]  Self P/F     [place], [place], [place] [address]     GOALS OF CARE/ ADVANCE CARE PLANNING:  Goals include to maximize quality of life and symptom management. Our advance care planning conversation included a discussion about:    The value and importance of advance care planning  Review and updating or creation of an advance directive document.  Patient is a FULL CODE.  PC will review MOST again at next visit.   2.         SOCIAL/EMOTIONAL/SPIRITUAL ASSESSMENT/ INTERVENTIONS:  SW met with patient and step-daughter Lattie Haw, in patients home for monthly palliative care visit. Patient resides in a one story home with spouse.    Functional changes/updates: Since previous in home visit patients spouse has declined and is currently IP with poor life expectancy. Patient continues to have memory deficits due to advancing Alzheimer. Patients stepdaughter, Lattie Haw, has acquired POA for patient. Step daughter has also taken on caregiver role and is visiting patient daily to monitor medications and meals.    Home & Environment assessment: No new concerns. Patient was witnessed leaving his neighbors house when SW pulled up for visit. Patient share that his family lives next door and offered breakfast to him this morning. .    Protein calorie malnutrition/weight loss/Appetite: Patient and family share that his appetite is fair, due to patient forgetting to eat sometimes. Stepdaughter visits patient daily to take him for breakfast and provide lunch and dinner.There has been a weightloss. No concerns or issues with sleeping noted.   Psychosocial assessment: completed. No physical needs identified at this time. SW discussed resources  available. Patients spouse/caregiver is no longer available to provide assistance to patient and patients stepdaughter has taken on this role. Stepdaughter has discussed placement with PCP SW, [place]. Currently patients monthly income ($2203.32) is above the income cap for Medicaid benefits to assist with placement. Patient could use additional in home support to assist with medication management/monitoring and meal prep, however resources are limited as patient would have to pay privately however Stepdaughter is ope to paying for caregivers to provide assistance in the home, knowing that patient may be reluctant to their presence. Ongoing support/resources will continued to be offered if needed.   Military hx: N/A    Medical assessment: patient denied pain, SHOB or chest pains. No swelling in ankles or feet. No other medication concerns.    Hospice discussion: N/A   SW discussed goals, reviewed care plan, provided emotional support, used active and reflective listening in the form of reciprocity emotional response. Questions and concerns were addressed. The patient/family was encouraged to call with any additional questions and/or concerns. PC Provided general support and encouragement, no other unmet needs identified. Will continue to follow.   3.         PATIENT/CAREGIVER EDUCATION/ COPING:   Appearance: well groomed, appropriate given situation  Mental Status: Alert/oriented x2 to self and place. Cognitive deficits present d/t Alzheimer's. Eye Contact: Good  Thought Process: irrational  Thought Content: not assessed  Speech: Normal rate, volume, tone  Mood: Normal and calm Affect: Congruent to endorsed mood, full ranging Insight: poor  Judgement: poor  Interaction Style: Cooperative   Patient A&O x2 to self and place, able to  simple conversation and recall his children. Patient able to engage in casual conversation, but does have apparent memory/cognitive deficits as he does not remember   SW visiting him last month and asked the same questions during conversation. Patient in good spirits today. No anxiety or depression present at this time. However patient is at risk of depressive symptoms as wife/caregiver has been out of the home for nearly a month due to failing health concerns. Patients family is supportive.    4.         PERSONAL EMERGENCY PLAN:  Patient/family will call 9-1-1 for emergencies.   5.         COMMUNITY RESOURCES COORDINATION/ HEALTH CARE NAVIGATION:  Spouse and daughter manages his care.   6.         FINANCIAL CONCERNS/NEEDS: None.                         Primary Health Insurance: [place]: N/A Prescription Coverage: Yes, no history of difficulty obtaining or affording prescriptions reported     SOCIAL HX:  Social History   Tobacco Use   Smoking status: Former   Smokeless tobacco: Never  Substance Use Topics   Alcohol use: Yes    CODE STATUS: FULL CODE ADVANCED DIRECTIVES: Y MOST FORM COMPLETE:  TBD HOSPICE EDUCATION PROVIDED: N  [id number] is SUPERVISION with all ADL's at this time. Patient is A&O  to self with poor insight and judgement.    Time spent: 40 min      [place] Henrene Pastor, [place]

## 2021-10-21 ENCOUNTER — Telehealth: Payer: Medicare HMO

## 2021-10-24 ENCOUNTER — Telehealth: Payer: Self-pay | Admitting: Licensed Clinical Social Worker

## 2021-10-24 NOTE — Telephone Encounter (Signed)
° °   Clinical Social Work  Care Management   Phone Outreach    10/24/2021 Name: Larry Buckley MRN: [id number] DOB: [birth year]  Larry Buckley is a 81 y.o. year old male who is a primary care patient of Cannady, Dorie Rank, NP .   Reason for referral: Caregiver Stress.    F/U phone call today to assess needs, progress and barriers with care plan goals.   Telephone outreach was unsuccessful. A HIPPA compliant phone message was left for the patient providing contact information and requesting a return call.   Plan:CCM LCSW will wait for return call. If no return call is received, CCM LCSW will make another attempt within 14 days  Review of patient status, including review of consultants reports, relevant laboratory and other test results, and collaboration with appropriate care team members and the patient's provider was performed as part of comprehensive patient evaluation and provision of care management services.    Jenel Lucks, MSW, LCSW [facility] [place]   [facility] [email or web address] Phone [phone] 9:57 AM

## 2021-10-28 ENCOUNTER — Telehealth: Payer: Self-pay

## 2021-10-28 NOTE — Chronic Care Management (AMB) (Signed)
°  Care Management   Note  10/28/2021 Name: [name] MRN: [id number] DOB: 07/12/1941  Larry Buckley is a 81 y.o. year old male who is a primary care patient of Marjie Skiff, NP and is actively engaged with the care management team. I reached out to Antoine Poche by phone today to assist with re-scheduling a follow up visit with the Licensed Clinical Social Worker  Follow up plan: Unsuccessful telephone outreach attempt made.  The care management team will reach out to the patient again over the next 7 days.  If patient returns call to provider office, please advise to call Embedded Care Management Care Guide Penne Lash  at [phone]  Penne Lash, RMA Care Guide, Embedded Care Coordination [facility]  [place], [place] [address] Direct Dial: [phone] [email or web address] Website: [email or web address]

## 2021-11-09 NOTE — Chronic Care Management (AMB) (Signed)
°  Care Management   Note  11/09/2021 Name: Larry Buckley MRN: [id number] DOB: 1941/02/06  Larry Buckley is a 81 y.o. year old male who is a primary care patient of Larry Skiff, NP and is actively engaged with the care management team. I reached out to Larry Buckley by phone today to assist with re-scheduling a follow up visit with the Licensed Clinical Social Worker  Follow up plan: Unsuccessful telephone outreach attempt made. A HIPAA compliant phone message was left for the patient providing contact information and requesting a return call.  The care management team will reach out to the patient again over the next 7 days.  If patient returns call to provider office, please advise to call Embedded Care Management Care Guide Larry Buckley  at [phone]  Larry Buckley, RMA Care Guide, Embedded Care Coordination [facility]  [place], [place] [address] Direct Dial: [phone] [email or web address] Website: [email or web address]

## 2021-11-10 ENCOUNTER — Telehealth: Payer: Medicare PPO

## 2021-11-15 ENCOUNTER — Telehealth: Payer: Self-pay | Admitting: Nurse Practitioner

## 2021-11-15 NOTE — Telephone Encounter (Signed)
Patient's daughter, Aviva Kluver, came in the office with completed FL2 form because it needs to be revised or a new form needs to be completed.  #15 on the form-"requested level of care" needs to be "OTHER- (skilled nursing facility)" and not nursing facility.   Please contact Aviva Kluver at [phone] with any questions and to also let her know when the form has been completed/revised.  Placed in provider's folder.

## 2021-11-16 NOTE — Telephone Encounter (Signed)
Patient family was called to inform them them that requested FL2 was updated and placed up front for pick up. Verbalized understanding.

## 2021-11-16 NOTE — Chronic Care Management (AMB) (Signed)
°  Care Management   Note  11/16/2021 Name: Larry Buckley MRN: [id number] DOB: 02-02-1941  Larry Buckley is a 81 y.o. year old male who is a primary care patient of Marjie Skiff, NP and is actively engaged with the care management team. I reached out to Antoine Poche by phone today to assist with re-scheduling a follow up visit with the Licensed Clinical Social Worker  Follow up plan: Telephone appointment with care management team member scheduled for:11/30/2021  Penne Lash, RMA Care Guide, Embedded Care Coordination [facility]  [place], [place] [address] Direct Dial: [phone] [email or web address] Website: [email or web address]

## 2021-11-21 ENCOUNTER — Telehealth: Payer: Self-pay | Admitting: Nurse Practitioner

## 2021-11-21 NOTE — Telephone Encounter (Signed)
Copied from CRM [id number]. Topic: Medicare AWV >> Nov 21, 2021 12:04 PM Leigh Aurora wrote: Reason for CRM:  N/A unable to leave a message for patient to call back and schedule the Medicare Annual Wellness Visit (AWV) virtually or by telephone.  Last AWV 03/16/12  Please schedule at anytime with [facility]-Nurse Health Advisor.  45 minute appointment  Any questions, please call me at [phone]

## 2021-11-23 ENCOUNTER — Other Ambulatory Visit: Payer: Self-pay

## 2021-11-23 ENCOUNTER — Other Ambulatory Visit: Payer: Medicare PPO | Admitting: Nurse Practitioner

## 2021-11-23 ENCOUNTER — Telehealth: Payer: Self-pay | Admitting: Nurse Practitioner

## 2021-11-23 NOTE — Telephone Encounter (Signed)
I attempted to call [name], Mr jacobe study, poa to confirm pc f/u visit, no answer, message left with contact information

## 2021-11-30 ENCOUNTER — Telehealth: Payer: Medicare PPO

## 2021-12-07 ENCOUNTER — Telehealth: Payer: Self-pay | Admitting: Licensed Clinical Social Worker

## 2021-12-07 NOTE — Telephone Encounter (Signed)
° °   Clinical Social Work  Care Management   Phone Outreach    12/07/2021 Name: Larry Buckley MRN: [id number] DOB: 06/07/1941  Larry Buckley is a 81 y.o. year old male who is a primary care patient of Cannady, Dorie Rank, NP .   Reason for referral: Level of Care Concerns and Mental Health Counseling and Resources.    2nd unsuccessful telephone outreach attempt.  If unable to reach patient by phone on the 3rd attempt, will discontinue outreach calls but will be available at any time to provide services.   Plan:CCM LCSW will wait for return call. If no return call is received, Will route chart to Care Guide to see if patient would like to reschedule phone appointment   Review of patient status, including review of consultants reports, relevant laboratory and other test results, and collaboration with appropriate care team members and the patient's provider was performed as part of comprehensive patient evaluation and provision of care management services.    Jenel Lucks, MSW, LCSW [facility] Care Management [place]   [facility] [email or web address] Phone [phone] 12:37 PM

## 2021-12-15 ENCOUNTER — Telehealth: Payer: Self-pay | Admitting: Nurse Practitioner

## 2021-12-15 NOTE — Telephone Encounter (Signed)
Copied from [place] [id number]. Topic: General - Inquiry ?>> Dec 15, 2021 11:50 AM Greggory Keen D wrote: ?Reason for CRM: Pt's daughter Lattie Haw is calling saying DSS is needing a new monthly FL2 form for March.  She said the office did one in Feb for they are needing another on for March ? ?CB#  [phone] ?

## 2021-12-19 NOTE — Telephone Encounter (Signed)
Spoke with daughter [name] to gather more information per Jolene in regards to patient's currently living status. [name] says patient is still at home currently and she has family members rotating to help take care of the patient. [name] states she will pick up paperwork tomorrow. ?

## 2021-12-26 ENCOUNTER — Telehealth: Payer: Medicare PPO

## 2021-12-28 ENCOUNTER — Other Ambulatory Visit: Payer: Self-pay

## 2021-12-28 ENCOUNTER — Encounter: Payer: Self-pay | Admitting: Emergency Medicine

## 2021-12-28 ENCOUNTER — Emergency Department: Payer: Medicare PPO

## 2021-12-28 ENCOUNTER — Inpatient Hospital Stay
Admission: EM | Admit: 2021-12-28 | Discharge: 2022-01-11 | DRG: 057 | Disposition: A | Discharge status: Home / Self Care | Payer: Medicare PPO | Attending: Obstetrics and Gynecology | Admitting: Obstetrics and Gynecology

## 2021-12-28 DIAGNOSIS — Z9889 Other specified postprocedural states: Secondary | ICD-10-CM

## 2021-12-28 DIAGNOSIS — Z5320 Procedure and treatment not carried out because of patient's decision for unspecified reasons: Secondary | ICD-10-CM | POA: Diagnosis not present

## 2021-12-28 DIAGNOSIS — F03911 Unspecified dementia, unspecified severity, with agitation: Secondary | ICD-10-CM | POA: Diagnosis present

## 2021-12-28 DIAGNOSIS — F05 Delirium due to known physiological condition: Secondary | ICD-10-CM | POA: Diagnosis present

## 2021-12-28 DIAGNOSIS — Z20822 Contact with and (suspected) exposure to covid-19: Secondary | ICD-10-CM | POA: Diagnosis present

## 2021-12-28 DIAGNOSIS — Z87891 Personal history of nicotine dependence: Secondary | ICD-10-CM

## 2021-12-28 DIAGNOSIS — R7303 Prediabetes: Secondary | ICD-10-CM | POA: Diagnosis present

## 2021-12-28 DIAGNOSIS — R509 Fever, unspecified: Secondary | ICD-10-CM | POA: Diagnosis not present

## 2021-12-28 DIAGNOSIS — I639 Cerebral infarction, unspecified: Secondary | ICD-10-CM

## 2021-12-28 DIAGNOSIS — Z5986 Financial insecurity: Secondary | ICD-10-CM

## 2021-12-28 DIAGNOSIS — Z833 Family history of diabetes mellitus: Secondary | ICD-10-CM

## 2021-12-28 DIAGNOSIS — Z8673 Personal history of transient ischemic attack (TIA), and cerebral infarction without residual deficits: Secondary | ICD-10-CM

## 2021-12-28 DIAGNOSIS — R930 Abnormal findings on diagnostic imaging of skull and head, not elsewhere classified: Secondary | ICD-10-CM

## 2021-12-28 DIAGNOSIS — G309 Alzheimer's disease, unspecified: Principal | ICD-10-CM | POA: Diagnosis present

## 2021-12-28 DIAGNOSIS — G459 Transient cerebral ischemic attack, unspecified: Secondary | ICD-10-CM | POA: Diagnosis not present

## 2021-12-28 DIAGNOSIS — Z888 Allergy status to other drugs, medicaments and biological substances status: Secondary | ICD-10-CM

## 2021-12-28 DIAGNOSIS — Z8249 Family history of ischemic heart disease and other diseases of the circulatory system: Secondary | ICD-10-CM

## 2021-12-28 DIAGNOSIS — F02811 Dementia in other diseases classified elsewhere, unspecified severity, with agitation: Secondary | ICD-10-CM | POA: Diagnosis present

## 2021-12-28 DIAGNOSIS — Z7982 Long term (current) use of aspirin: Secondary | ICD-10-CM

## 2021-12-28 DIAGNOSIS — M109 Gout, unspecified: Secondary | ICD-10-CM | POA: Diagnosis present

## 2021-12-28 DIAGNOSIS — F03918 Unspecified dementia, unspecified severity, with other behavioral disturbance: Secondary | ICD-10-CM | POA: Diagnosis present

## 2021-12-28 DIAGNOSIS — I10 Essential (primary) hypertension: Secondary | ICD-10-CM | POA: Diagnosis present

## 2021-12-28 DIAGNOSIS — R4182 Altered mental status, unspecified: Principal | ICD-10-CM

## 2021-12-28 DIAGNOSIS — Z79899 Other long term (current) drug therapy: Secondary | ICD-10-CM

## 2021-12-28 DIAGNOSIS — E782 Mixed hyperlipidemia: Secondary | ICD-10-CM | POA: Diagnosis present

## 2021-12-28 LAB — COMPREHENSIVE METABOLIC PANEL
ALT: 14 U/L (ref 0–44)
AST: 18 U/L (ref 15–41)
Albumin: 3.5 g/dL (ref 3.5–5.0)
Alkaline Phosphatase: 60 U/L (ref 38–126)
Anion gap: 8 (ref 5–15)
BUN: 16 mg/dL (ref 8–23)
CO2: 28 mmol/L (ref 22–32)
Calcium: 8.9 mg/dL (ref 8.9–10.3)
Chloride: 104 mmol/L (ref 98–111)
Creatinine, Ser: 0.99 mg/dL (ref 0.61–1.24)
GFR, Estimated: >60 mL/min (ref >60)
Glucose, Bld: 126 mg/dL — ABNORMAL HIGH (ref 70–99)
Potassium: 4.1 mmol/L (ref 3.5–5.1)
Sodium: 140 mmol/L (ref 135–145)
Total Bilirubin: 0.3 mg/dL (ref 0.3–1.2)
Total Protein: 7.7 g/dL (ref 6.5–8.1)

## 2021-12-28 LAB — CBC WITH DIFFERENTIAL/PLATELET
Abs Immature Granulocytes: 0.03 10*3/uL (ref 0.00–0.07)
Basophils Absolute: 0.1 10*3/uL (ref 0.0–0.1)
Basophils Relative: 1 %
Eosinophils Absolute: 0.1 10*3/uL (ref 0.0–0.5)
Eosinophils Relative: 3 %
HCT: 41.8 % (ref 39.0–52.0)
Hemoglobin: 13.3 g/dL (ref 13.0–17.0)
Immature Granulocytes: 1 %
Lymphocytes Relative: 30 %
Lymphs Abs: 1.6 10*3/uL (ref 0.7–4.0)
MCH: 30 pg (ref 26.0–34.0)
MCHC: 31.8 g/dL (ref 30.0–36.0)
MCV: 94.4 fL (ref 80.0–100.0)
Monocytes Absolute: 0.6 10*3/uL (ref 0.1–1.0)
Monocytes Relative: 11 %
Neutro Abs: 2.9 10*3/uL (ref 1.7–7.7)
Neutrophils Relative %: 54 %
Platelets: 308 10*3/uL (ref 150–400)
RBC: 4.43 MIL/uL (ref 4.22–5.81)
RDW: 13.8 % (ref 11.5–15.5)
WBC: 5.3 10*3/uL (ref 4.0–10.5)
nRBC: 0 % (ref 0.0–0.2)

## 2021-12-28 LAB — TROPONIN I (HIGH SENSITIVITY): Troponin I (High Sensitivity): 17 ng/L (ref <18)

## 2021-12-28 NOTE — ED Triage Notes (Signed)
Pt to ED from home with family.  Pt unsure why he is here but family states pt becoming more altered since Monday.  Pt will not realize he is at home according to family, states he climbed through bathroom window and found by neighbor tonight.  Pt denies pain, denies urinary symptoms, patient alert to name and DOB but disoriented to place, situation and time.  Hx of dementia, baseline confusion but worse than normal. ?

## 2021-12-28 NOTE — ED Provider Notes (Signed)
? ?[facility] ?Provider Note ? ? ? Event Date/Time  ? First MD Initiated Contact with Patient 12/28/21 2317   ?  (approximate) ? ? ?History  ? ?Altered Mental Status ? ? ?HPI ? ?Larry Buckley is a 81 y.o. male who presents to the ED for evaluation of Altered Mental Status ?  ?I review outpatient TeleNeuro visit from 2/24 .  History of Alzheimer's dementia.  ? ?Patient's daughter brings him to the ED for evaluation of increased agitation altered mentation and not acting himself.  She reports about 1 week of altered behavior beyond his typical dementia.  He is more agitated, trying to escape the house.  She reports she is unable to sleep and care for him due to this, asking about placement. ? ?Patient reports no complaints.  He is requesting food. ? ?Physical Exam  ? ?Triage Vital Signs: ?ED Triage Vitals  ?Enc Vitals Group  ?   BP 12/28/21 2159 (!) 154/65  ?   Pulse Rate 12/28/21 2159 91  ?   Resp 12/28/21 2159 16  ?   Temp 12/28/21 2159 98.3 ?F (36.8 ?C)  ?   Temp Source 12/28/21 2159 Oral  ?   SpO2 12/28/21 2159 97 %  ?   Weight 12/28/21 2206 198 lb 6.6 oz (90 kg)  ?   Height 12/28/21 2206 6\' 2"  (1.88 m)  ?   Head Circumference --   ?   Peak Flow --   ?   Pain Score 12/28/21 2206 0  ?   Pain Loc --   ?   Pain Edu? --   ?   Excl. in GC? --   ? ? ?Most recent vital signs: ?Vitals:  ? 12/28/21 2159  ?BP: (!) 154/65  ?Pulse: 91  ?Resp: 16  ?Temp: 98.3 ?F (36.8 ?C)  ?SpO2: 97%  ? ? ?General: Awake, no distress.  Pleasantly disoriented.  ?CV:  Good peripheral perfusion.  ?Resp:  Normal effort.  ?Abd:  No distention.  ?MSK:  No deformity noted.  ?Neuro:  No focal deficits appreciated. Cranial nerves II through XII intact ?5/5 strength and sensation in all 4 extremities ?Other:   ? ? ?ED Results / Procedures / Treatments  ? ?Labs ?(all labs ordered are listed, but only abnormal results are displayed) ?Labs Reviewed  ?COMPREHENSIVE METABOLIC PANEL - Abnormal; Notable for the following  components:  ?    Result Value  ? Glucose, Bld 126 (*)   ? All other components within normal limits  ?RESP PANEL BY RT-PCR (FLU A&B, COVID) ARPGX2  ?CBC WITH DIFFERENTIAL/PLATELET  ?URINALYSIS, ROUTINE W REFLEX MICROSCOPIC  ?BASIC METABOLIC PANEL  ?ETHANOL  ?PROTIME-INR  ?APTT  ?URINE DRUG SCREEN, QUALITATIVE ([facility] ONLY)  ?HEMOGLOBIN A1C  ?LIPID PANEL  ?TROPONIN I (HIGH SENSITIVITY)  ?TROPONIN I (HIGH SENSITIVITY)  ? ? ?EKG ?Sinus rhythm, rate of 63 bpm.  Normal axis and intervals.  Lateral and inferior T wave inversions without STEMI. ? ?RADIOLOGY ?CT head reviewed by me with hypoattenuation to the right occipital lobe, no bleed ?CXR reviewed by me without evidence of acute cardiopulmonary pathology. ? ?Official radiology report(s): ?CT Head Wo Contrast ? ?Result Date: 12/28/2021 ?CLINICAL DATA:  Altered mental status, nontraumatic. EXAM: CT HEAD WITHOUT CONTRAST TECHNIQUE: Contiguous axial images were obtained from the base of the skull through the vertex without intravenous contrast. RADIATION DOSE REDUCTION: This exam was performed according to the departmental dose-optimization program which includes automated exposure control, adjustment of the mA and/or  kV according to patient size and/or use of iterative reconstruction technique. COMPARISON:  MRI brain 06/13/2020 FINDINGS: Brain: Diffuse cerebral atrophy. Ventricular dilatation consistent with central atrophy. Low-attenuation changes in the deep white matter consistent with small vessel ischemia. Small area of low-attenuation extending of the cortex in the right occipital lobe is new since prior MRI, possibly representing acute area of ischemia. No abnormal extra-axial fluid collections. No mass effect or midline shift. Gray-white matter junctions are distinct. Basal cisterns are not effaced. No acute intracranial hemorrhage. Vascular: Moderate intracranial arterial vascular calcifications. Skull: Calvarium appears intact. Sinuses/Orbits: Paranasal sinuses  and mastoid air cells are clear. Other: None. IMPRESSION: 1. Low-attenuation in the right occipital lobe is new since prior study, possibly acute ischemia. 2. No acute intracranial hemorrhage or mass effect. Chronic atrophy and small vessel ischemic changes. Electronically Signed   By: Burman Nieves M.D.   On: 12/28/2021 22:52  ? ?DG Chest Portable 1 View ? ?Result Date: 12/29/2021 ?CLINICAL DATA:  Altered mental status EXAM: PORTABLE CHEST 1 VIEW COMPARISON:  02/14/2016 FINDINGS: Heart and mediastinal contours are within normal limits. No focal opacities or effusions. No acute bony abnormality. IMPRESSION: No active disease. Electronically Signed   By: Charlett Nose M.D.   On: 12/29/2021 00:35   ? ?PROCEDURES and INTERVENTIONS: ? ?.1-3 Lead EKG Interpretation ?Performed by: Delton Prairie, MD ?Authorized by: Delton Prairie, MD  ? ?  Interpretation: normal   ?  ECG rate:  88 ?  ECG rate assessment: normal   ?  Rhythm: sinus rhythm   ?  Ectopy: none   ?  Conduction: normal   ? ?Medications  ?QUEtiapine (SEROQUEL) tablet 100 mg (has no administration in time range)  ?mirtazapine (REMERON) tablet 15 mg (has no administration in time range)  ?atorvastatin (LIPITOR) tablet 20 mg (has no administration in time range)  ?aspirin EC tablet 81 mg (has no administration in time range)  ? stroke: mapping our early stages of recovery book (has no administration in time range)  ?0.9 %  sodium chloride infusion (has no administration in time range)  ?acetaminophen (TYLENOL) tablet 650 mg (has no administration in time range)  ?  Or  ?acetaminophen (TYLENOL) 160 MG/5ML solution 650 mg (has no administration in time range)  ?  Or  ?acetaminophen (TYLENOL) suppository 650 mg (has no administration in time range)  ?senna-docusate (Senokot-S) tablet 1 tablet (has no administration in time range)  ?enoxaparin (LOVENOX) injection 40 mg (has no administration in time range)  ? ? ? ?IMPRESSION / MDM / ASSESSMENT AND PLAN / ED COURSE  ?I  reviewed the triage vital signs and the nursing notes. ? ?81 year old male presents to the ED with acute on chronic altered mentation, concerning for an acute stroke and requiring medical admission.  He looks clinically well and I do not appreciate any neurologic deficits on my examination.  No signs of trauma or vascular deficits.  Blood work is benign with normal metabolic panel, troponin and CBC.  CT scan of the head questions a new right-sided occipital infarct which may be the etiology of his abruptly worsening mental status at home.  MRI of the brain is ordered and we will consult with medicine for admission considering unsafe discharge planning and the possibility of stroke. ? ?  ? ? ?FINAL CLINICAL IMPRESSION(S) / ED DIAGNOSES  ? ?Final diagnoses:  ?Altered mental status, unspecified altered mental status type  ?Abnormal CT of the head  ? ? ? ?Rx / DC Orders  ? ?  ED Discharge Orders   ? ? None  ? ?  ? ? ? ?Note:  This document was prepared using Dragon voice recognition software and may include unintentional dictation errors. ?  ?Delton PrairieSmith, Ragan Reale, MD ?12/29/21 [phone] ? ?

## 2021-12-29 ENCOUNTER — Emergency Department: Payer: Medicare PPO

## 2021-12-29 ENCOUNTER — Observation Stay: Payer: Medicare PPO

## 2021-12-29 ENCOUNTER — Observation Stay
Admit: 2021-12-29 | Discharge: 2021-12-29 | Disposition: A | Discharge status: Home / Self Care | Payer: Medicare PPO | Attending: Internal Medicine | Admitting: Internal Medicine

## 2021-12-29 DIAGNOSIS — R930 Abnormal findings on diagnostic imaging of skull and head, not elsewhere classified: Secondary | ICD-10-CM | POA: Diagnosis not present

## 2021-12-29 DIAGNOSIS — F03911 Unspecified dementia, unspecified severity, with agitation: Secondary | ICD-10-CM | POA: Diagnosis present

## 2021-12-29 DIAGNOSIS — F03918 Unspecified dementia, unspecified severity, with other behavioral disturbance: Secondary | ICD-10-CM | POA: Diagnosis present

## 2021-12-29 LAB — BASIC METABOLIC PANEL
Anion gap: 6 (ref 5–15)
BUN: 14 mg/dL (ref 8–23)
CO2: 29 mmol/L (ref 22–32)
Calcium: 8.8 mg/dL — ABNORMAL LOW (ref 8.9–10.3)
Chloride: 105 mmol/L (ref 98–111)
Creatinine, Ser: 0.85 mg/dL (ref 0.61–1.24)
GFR, Estimated: >60 mL/min (ref >60)
Glucose, Bld: 123 mg/dL — ABNORMAL HIGH (ref 70–99)
Potassium: 4 mmol/L (ref 3.5–5.1)
Sodium: 140 mmol/L (ref 135–145)

## 2021-12-29 LAB — URINALYSIS, ROUTINE W REFLEX MICROSCOPIC
Bilirubin Urine: NEGATIVE
Glucose, UA: NEGATIVE mg/dL
Hgb urine dipstick: NEGATIVE
Ketones, ur: NEGATIVE mg/dL
Leukocytes,Ua: NEGATIVE
Nitrite: NEGATIVE
Protein, ur: NEGATIVE mg/dL
Specific Gravity, Urine: 1.008 (ref 1.005–1.030)
pH: 8 (ref 5.0–8.0)

## 2021-12-29 LAB — LIPID PANEL
Cholesterol: 131 mg/dL (ref 0–200)
HDL: 49 mg/dL (ref >40)
LDL Cholesterol: 73 mg/dL (ref 0–99)
Total CHOL/HDL Ratio: 2.7 RATIO
Triglycerides: 44 mg/dL (ref <150)
VLDL: 9 mg/dL (ref 0–40)

## 2021-12-29 LAB — URINE DRUG SCREEN, QUALITATIVE (ARMC ONLY)
Amphetamines, Ur Screen: NOT DETECTED
Barbiturates, Ur Screen: NOT DETECTED
Benzodiazepine, Ur Scrn: NOT DETECTED
Cannabinoid 50 Ng, Ur ~~LOC~~: NOT DETECTED
Cocaine Metabolite,Ur ~~LOC~~: NOT DETECTED
MDMA (Ecstasy)Ur Screen: NOT DETECTED
Methadone Scn, Ur: NOT DETECTED
Opiate, Ur Screen: NOT DETECTED
Phencyclidine (PCP) Ur S: NOT DETECTED
Tricyclic, Ur Screen: NOT DETECTED

## 2021-12-29 LAB — RESP PANEL BY RT-PCR (FLU A&B, COVID) ARPGX2
Influenza A by PCR: NEGATIVE
Influenza B by PCR: NEGATIVE
SARS Coronavirus 2 by RT PCR: NEGATIVE

## 2021-12-29 LAB — PROTIME-INR
INR: 1 (ref 0.8–1.2)
Prothrombin Time: 13 seconds (ref 11.4–15.2)

## 2021-12-29 LAB — APTT: aPTT: 32 seconds (ref 24–36)

## 2021-12-29 LAB — GLUCOSE, CAPILLARY: Glucose-Capillary: 133 mg/dL — ABNORMAL HIGH (ref 70–99)

## 2021-12-29 LAB — ETHANOL: Alcohol, Ethyl (B): <10 mg/dL (ref <10)

## 2021-12-29 LAB — TROPONIN I (HIGH SENSITIVITY): Troponin I (High Sensitivity): 20 ng/L — ABNORMAL HIGH (ref <18)

## 2021-12-29 IMAGING — MR MR HEAD W/O CM
8 of 10 series · 37 of 48 positions shown · non-contrast
Comparison: [DATE]; correlation made with CT head earlier same
day

CLINICAL DATA: Altered mental status, increased from baseline
dementia, eval occipital stroke seen on same day CT

EXAM:
MRI HEAD WITHOUT CONTRAST
TECHNIQUE: Multiplanar, multiecho pulse sequences of the brain and surrounding
structures were obtained without intravenous contrast.

[Series 2: ax dwi_tracew · axial · 3.0mm · 0.71mm/px · z∈[-127,+38]mm · 6 of 56 slices shown]
[im 1/56]
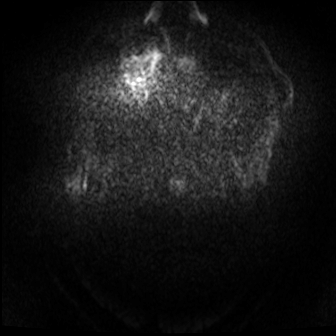
[im 12/56]
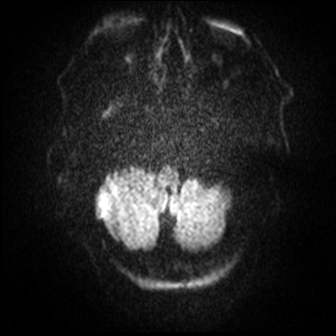
[im 23/56]
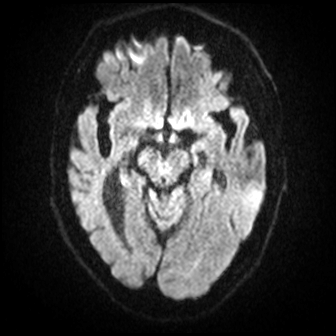
[im 34/56]
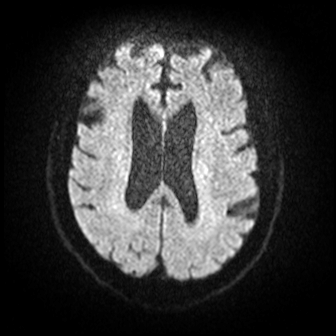
[im 45/56]
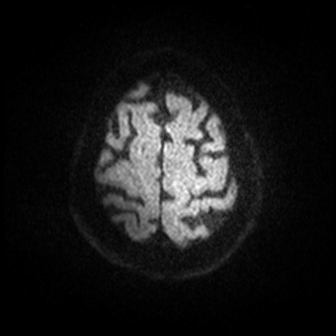
[im 56/56]
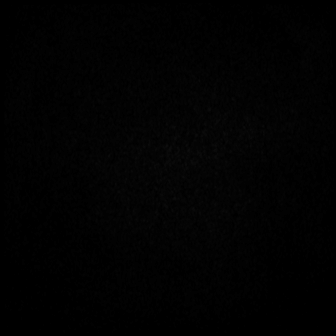

[Series 3: ax dwi_adc · axial · 3.0mm · 0.71mm/px · z∈[-127,+32]mm · 7 of 54 slices shown]
[im 1/54]
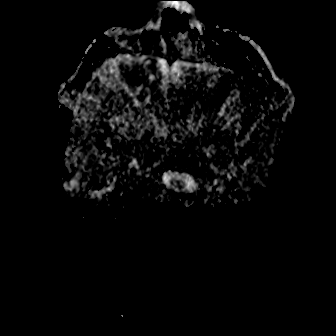
[im 9/54]
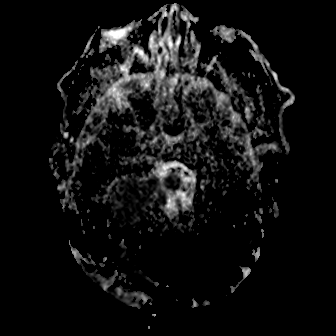
[im 18/54]
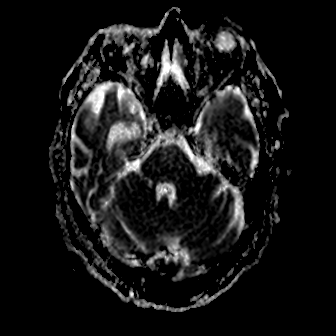
[im 27/54]
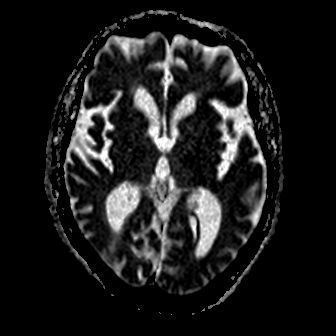
[im 36/54]
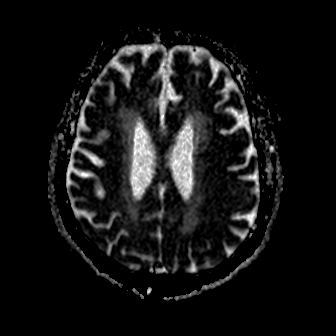
[im 45/54]
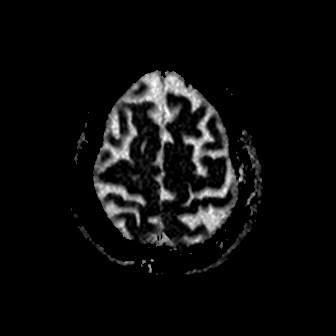
[im 54/54]
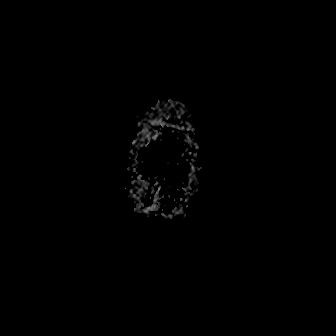

[Series 4: cor dwi_tracew · coronal · 5.0mm · 0.68mm/px · 5 of 44 slices shown]
[im 1/44]
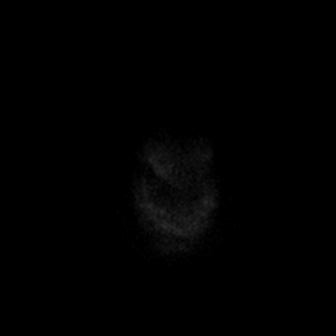
[im 9/44]
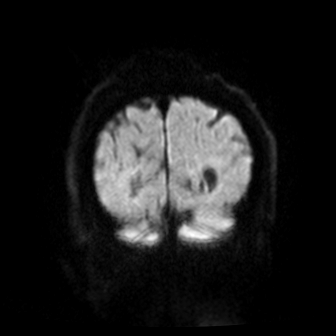
[im 18/44]
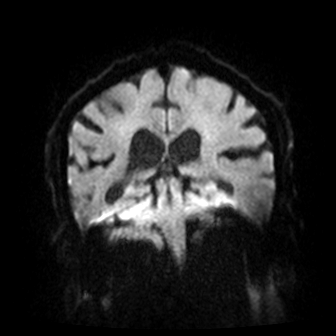
[im 26/44]
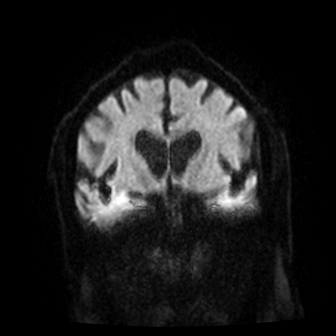
[im 35/44]
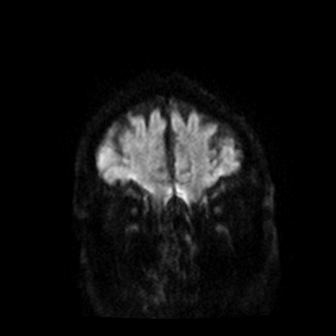

[Series 6: T1 · sagittal · 5.0mm · 0.47mm/px · 3 of 24 slices shown (1 of 2)]
[im 1/24]
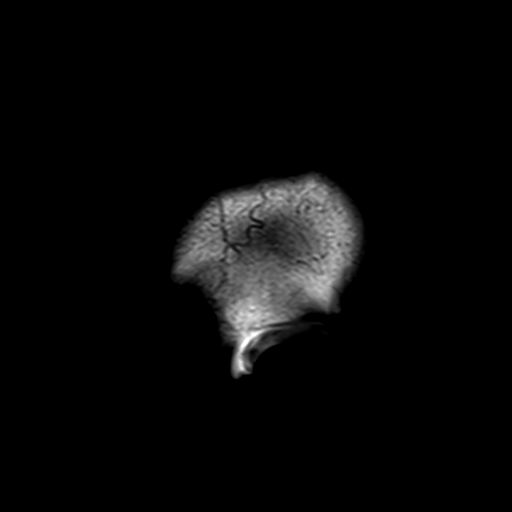
[im 12/24]
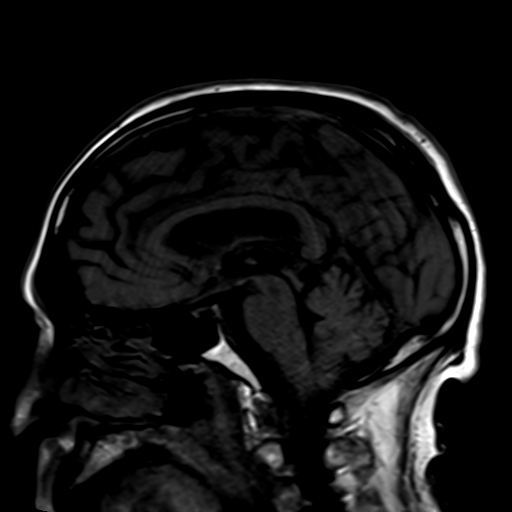
[im 24/24]
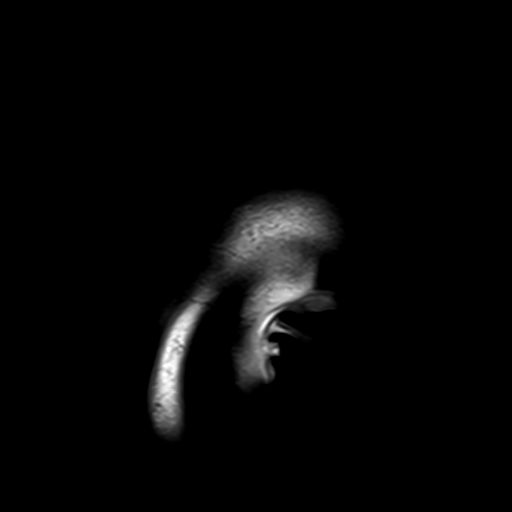

[Series 7: T2 · axial · 5.0mm · 0.45mm/px · z∈[-128,+40]mm · 4 of 29 slices shown (1 of 2)]
[im 1/29]
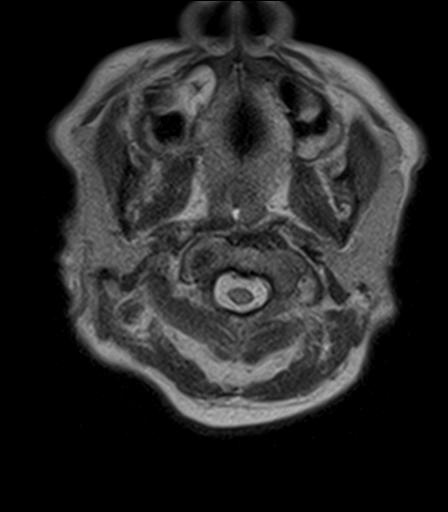
[im 10/29]
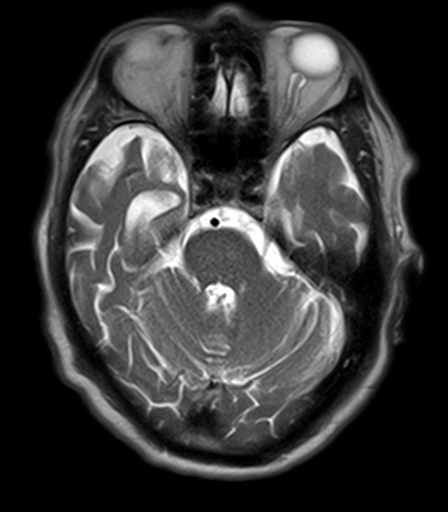
[im 19/29]
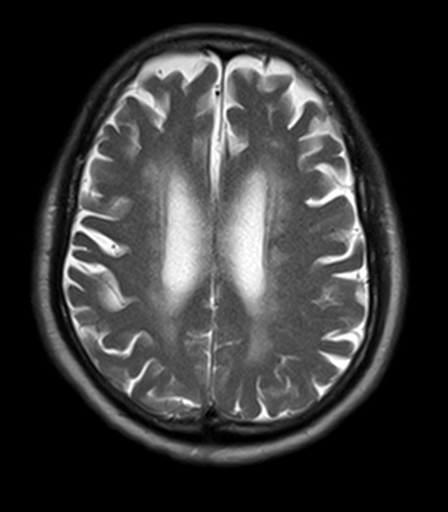
[im 29/29]
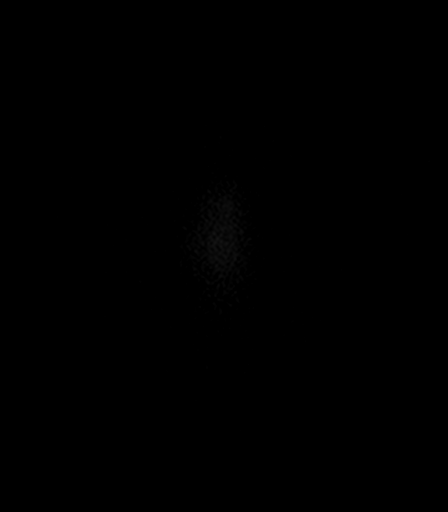

[Series 9: T1 · axial · 5.0mm · 0.90mm/px · z∈[-128,+40]mm · 4 of 29 slices shown (2 of 2)]
[im 1/29]
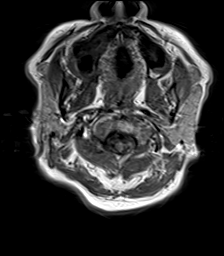
[im 10/29]
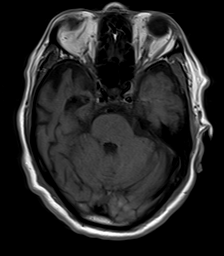
[im 19/29]
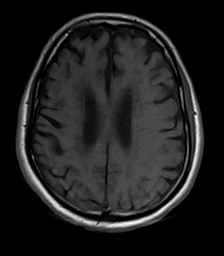
[im 29/29]
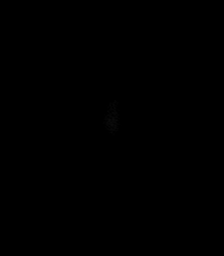

[Series 10: FLAIR · axial · 5.0mm · 1.20mm/px · z∈[-128,+40]mm · 4 of 29 slices shown]
[im 1/29]
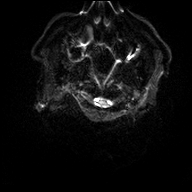
[im 10/29]
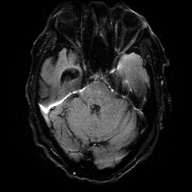
[im 19/29]
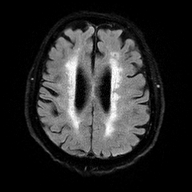
[im 29/29]
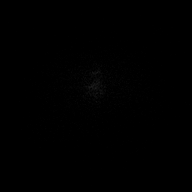

[Series 11: T2 · coronal · 5.0mm · 0.45mm/px · 4 of 33 slices shown (2 of 2)]
[im 1/33]
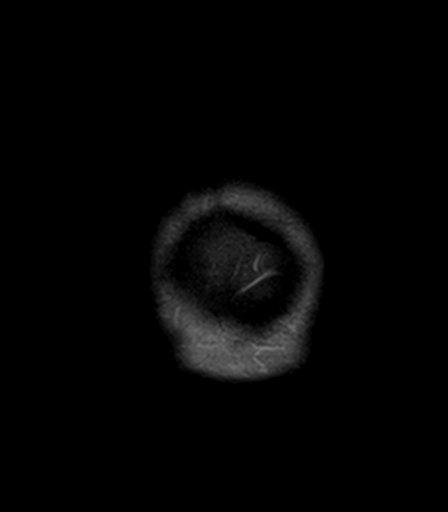
[im 11/33]
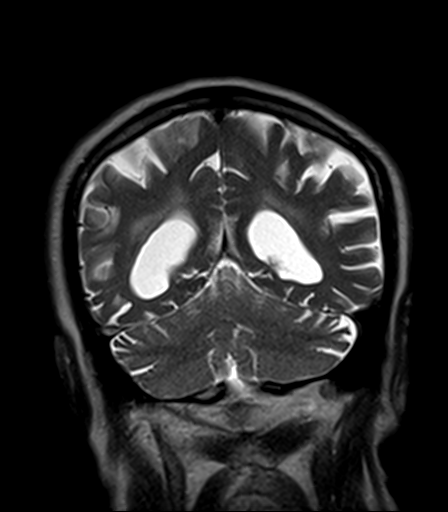
[im 22/33]
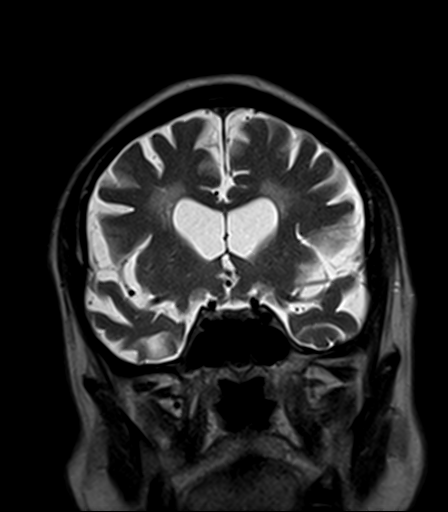
[im 33/33]
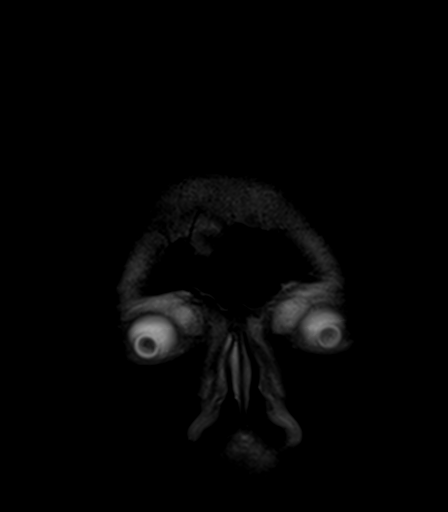

[37 of 48 positions shown; findings below may reference images not displayed]

FINDINGS: Brain: There is no acute infarction or intracranial hemorrhage.
There is a small chronic right cortical/subcortical right occipital
infarct corresponding to abnormality on CT. Additional patchy and
confluent areas of T2 hyperintensity in the supratentorial white
matter are nonspecific but may reflect moderate chronic
microvascular ischemic changes similar to the prior study. Scattered
foci of susceptibility particularly in the right occipital and
temporal lobes are less apparent due to differences in technique.
Prominence of the ventricles and sulci reflects similar parenchymal
volume loss.

There is no intracranial mass, mass effect, or edema. There is no
hydrocephalus or extra-axial fluid collection.

Vascular: Major vessel flow voids at the skull base are preserved.

Skull and upper cervical spine: Normal marrow signal is preserved.

Sinuses/Orbits: Paranasal sinuses are aerated. Orbits are
unremarkable.

Other: Sella is unremarkable.  Mastoid air cells are clear.
IMPRESSION: New but chronic small right occipital infarct.

No acute infarction, hemorrhage, or mass. Chronic microvascular
ischemic changes and parenchymal volume loss are similar to the
prior study.

## 2021-12-29 IMAGING — DX DG CHEST 1V PORT
1 series · 1 of 1 positions shown · non-contrast
Comparison: [DATE]

CLINICAL DATA: Altered mental status

EXAM:
PORTABLE CHEST 1 VIEW

[chest ap]
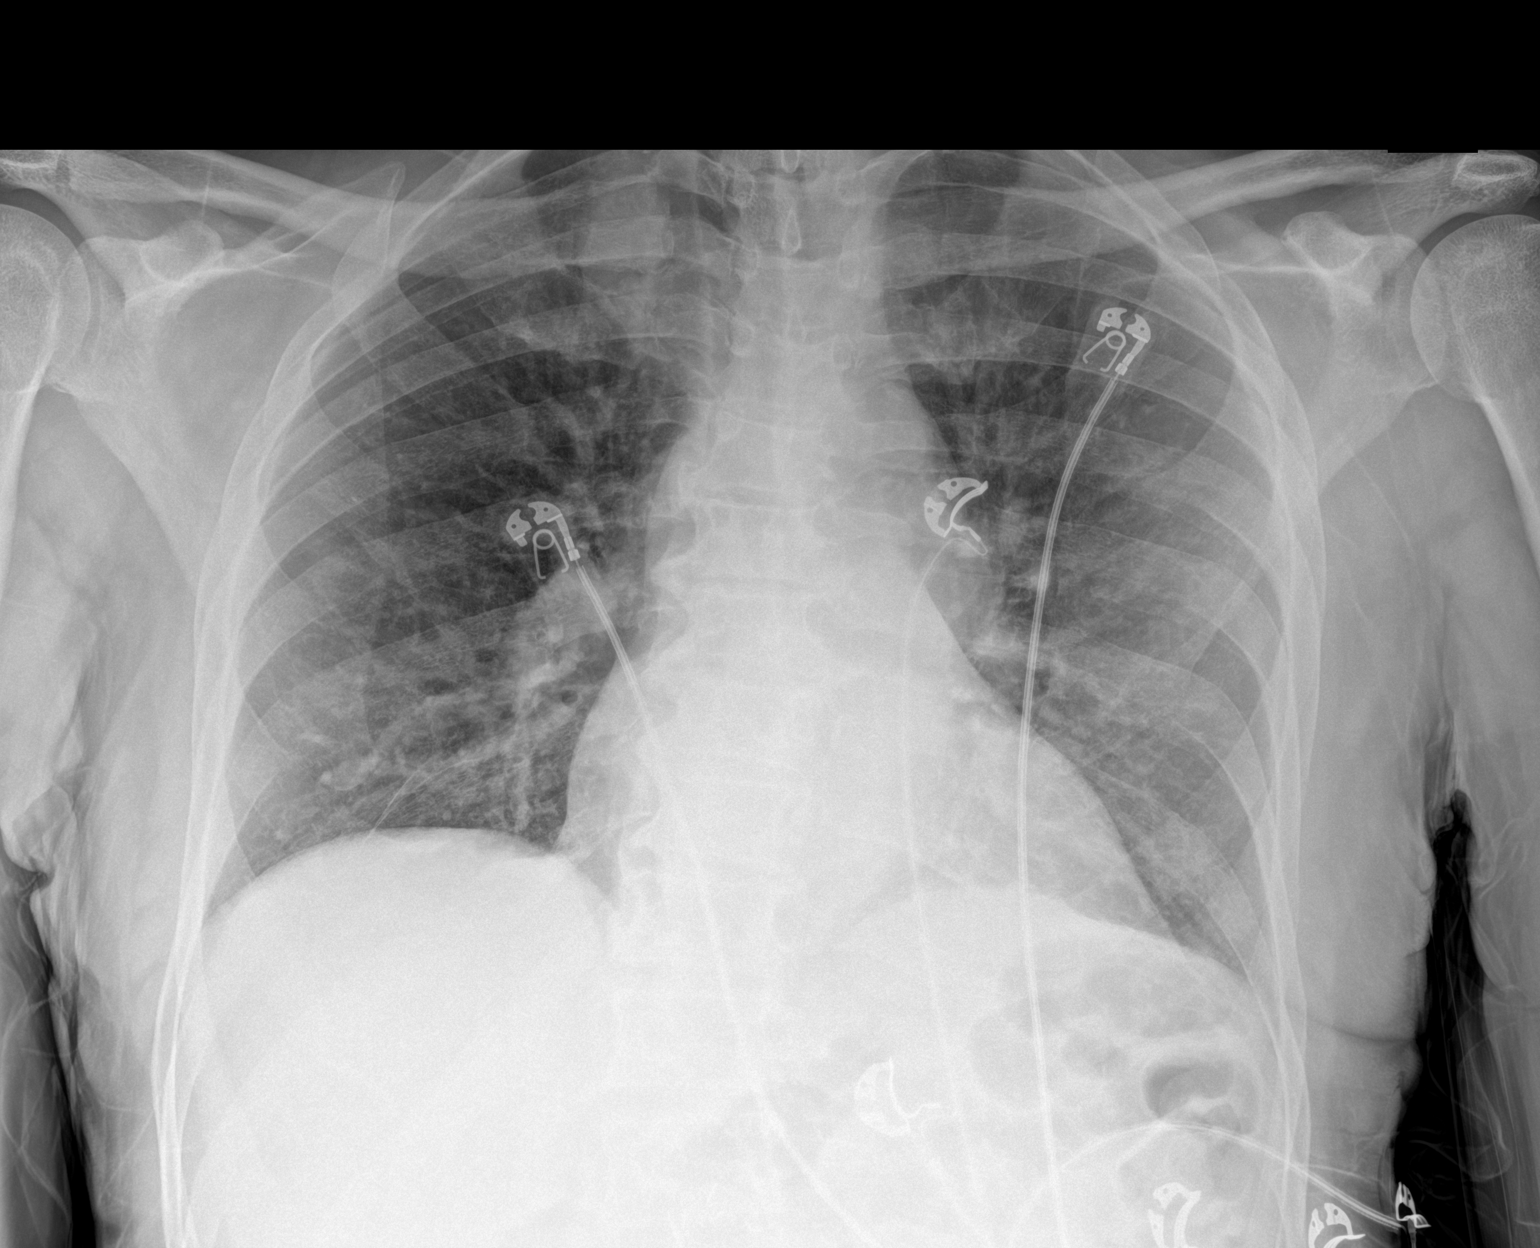

[1 of 1 positions shown; findings below may reference images not displayed]

FINDINGS: Heart and mediastinal contours are within normal limits. No focal
opacities or effusions. No acute bony abnormality.
IMPRESSION: No active disease.

## 2021-12-29 IMAGING — US US CAROTID DUPLEX BILAT
1 series · 13 of 24 positions shown · non-contrast
Comparison: None.

CLINICAL DATA: 80-year-old male with history of stroke.

EXAM:
BILATERAL CAROTID DUPLEX ULTRASOUND
TECHNIQUE: Gray scale imaging, color Doppler and duplex ultrasound were
performed of bilateral carotid and vertebral arteries in the neck.

[Series 1: us carotid bilateral · 13 of 60 slices shown]
[im 1/60]
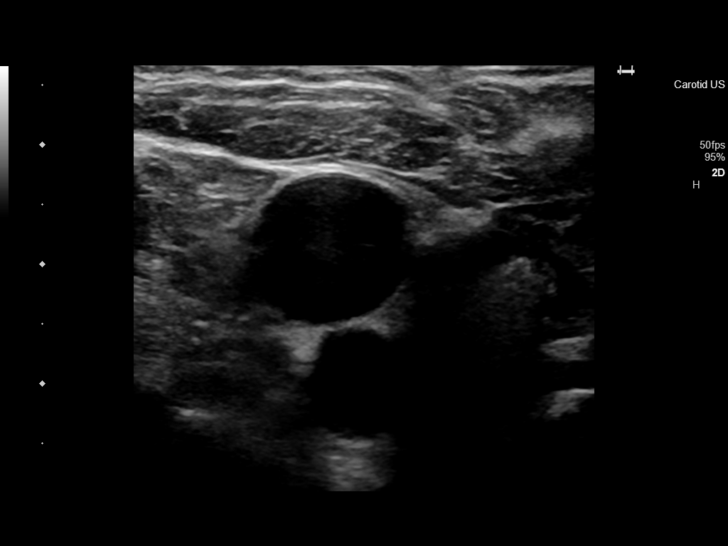
[im 6/60]
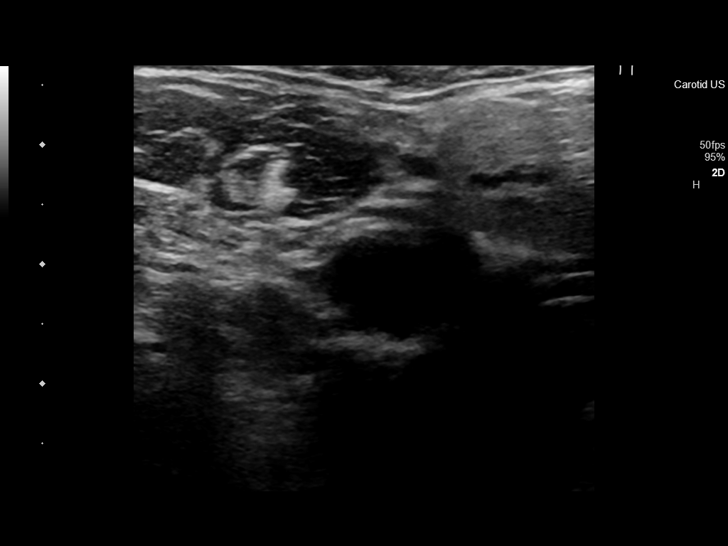
[im 11/60]
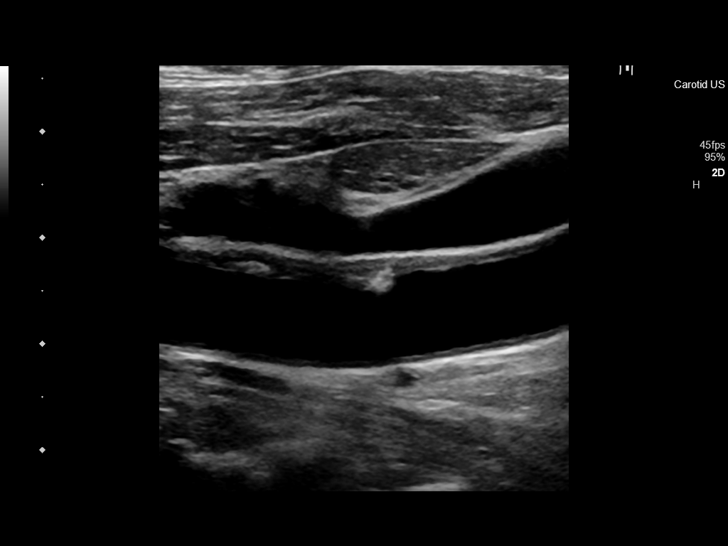
[im 16/60]
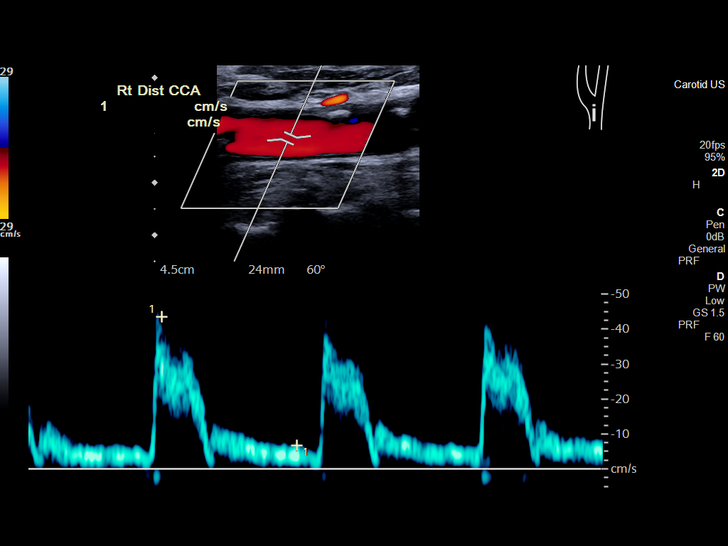
[im 21/60]
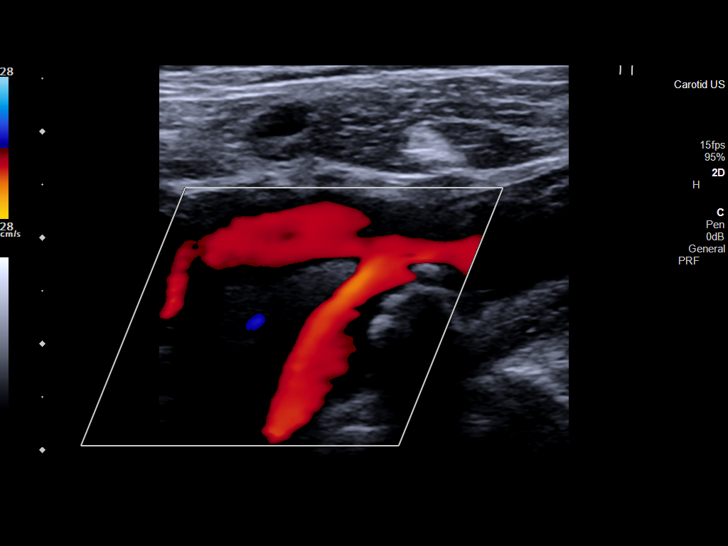
[im 26/60]
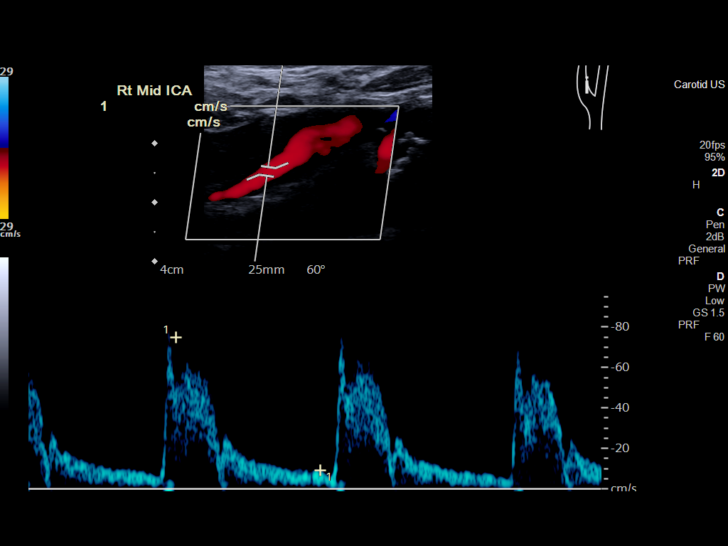
[im 31/60]
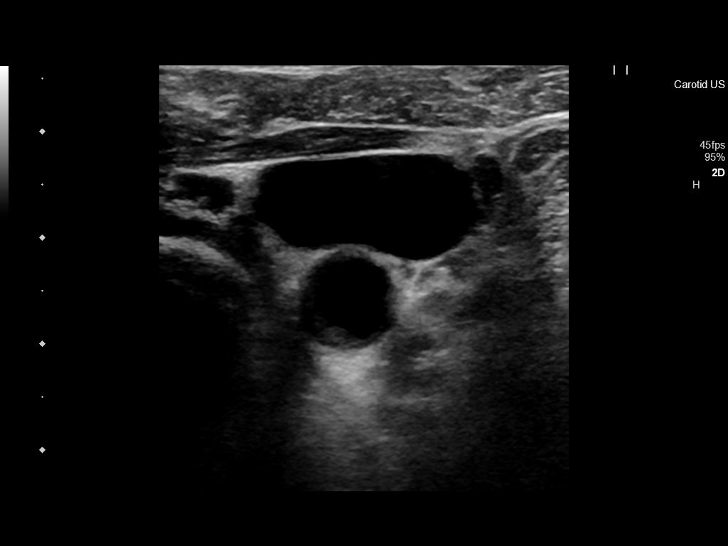
[im 34/60]
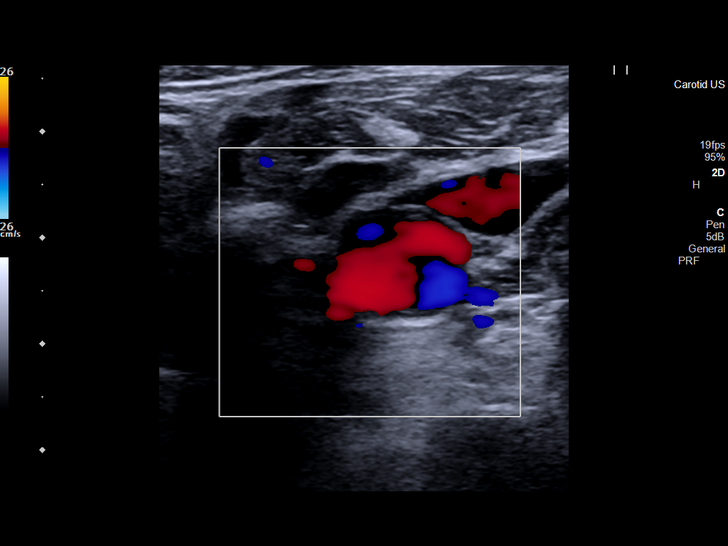
[im 39/60]
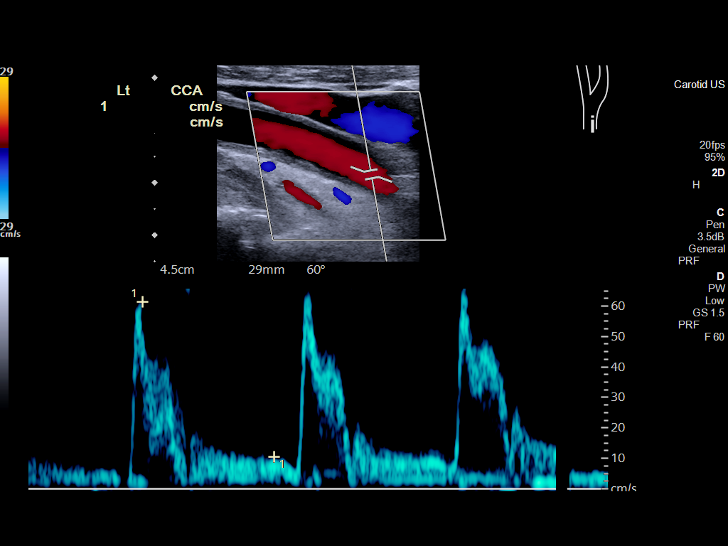
[im 44/60]
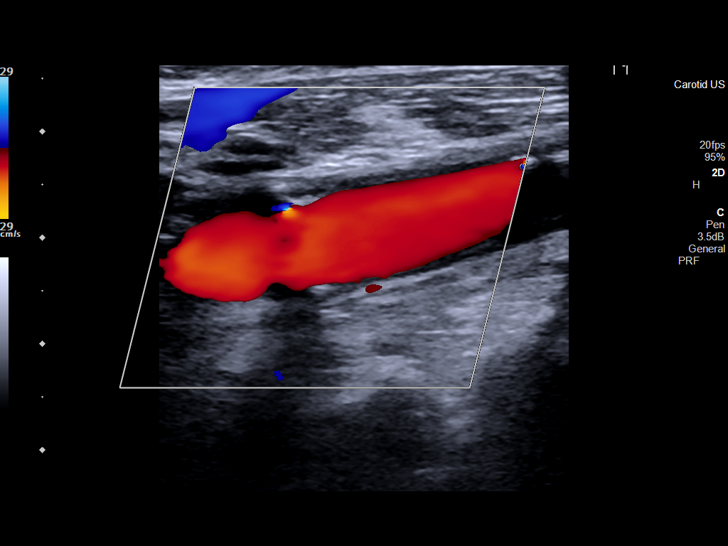
[im 49/60]
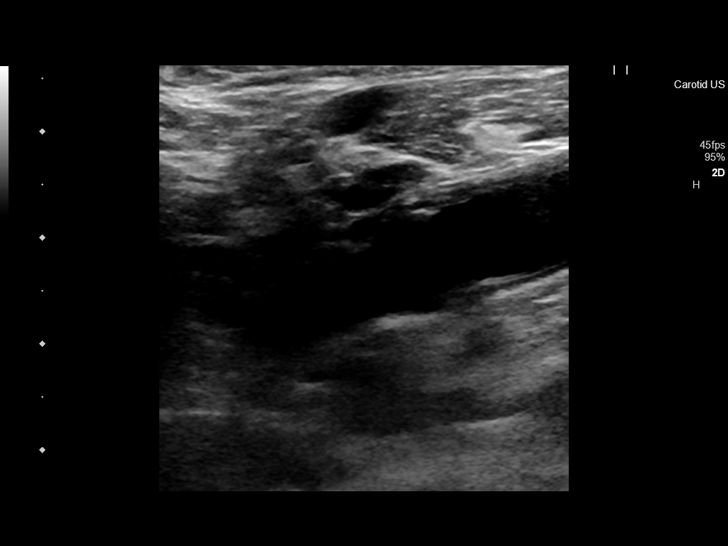
[im 54/60]
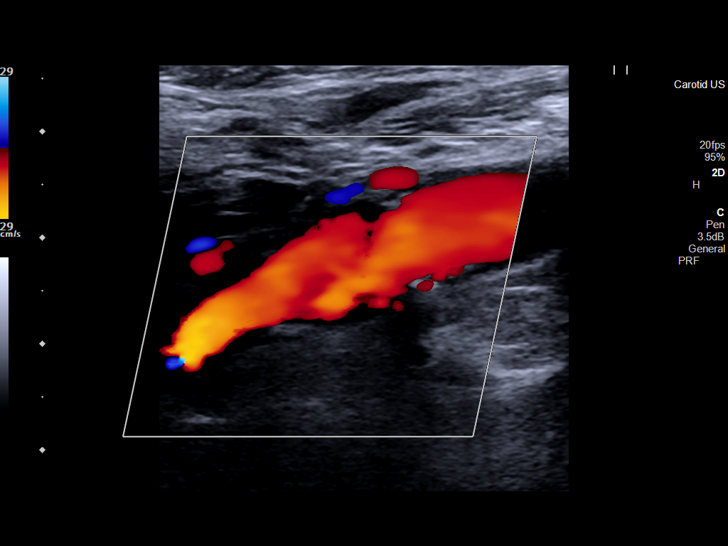
[im 60/60]
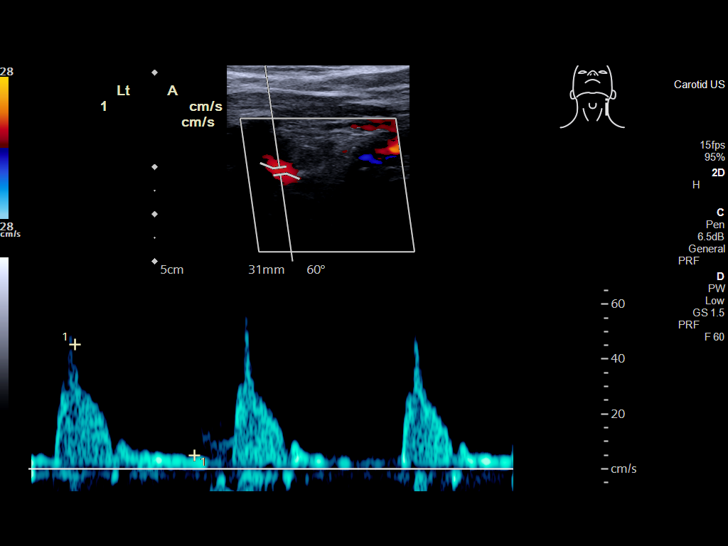

[13 of 24 positions shown; findings below may reference images not displayed]

FINDINGS: Criteria: Quantification of carotid stenosis is based on velocity
parameters that correlate the residual internal carotid diameter
with NASCET-based stenosis levels, using the diameter of the distal
internal carotid lumen as the denominator for stenosis measurement.

The following velocity measurements were obtained:

RIGHT

ICA: Peak systolic velocity 75 cm/sec, End diastolic velocity 9
cm/sec

CCA: Peak systolic velocity 44 cm/sec

SYSTOLIC ICA/CCA RATIO:

ECA: Peak systolic velocity 122 cm/sec

LEFT

ICA: Peak systolic velocity 139 cm/sec, End diastolic velocity 43
cm/sec

CCA: 57 cm/sec

SYSTOLIC ICA/CCA RATIO:

ECA: 33 cm/sec

RIGHT CAROTID ARTERY: Mild multifocal atherosclerotic plaque
formation. No significant tortuosity. Normal low resistance
waveforms.

RIGHT VERTEBRAL ARTERY:  Antegrade flow.

LEFT CAROTID ARTERY: Moderate multifocal atherosclerotic plaque
formation, most prominent at the carotid bulb. No significant
tortuosity. Normal low resistance waveforms.

LEFT VERTEBRAL ARTERY:  Antegrade flow.

Upper extremity non-invasive blood pressures:

Not obtained.
IMPRESSION: 1. Right carotid artery system: Less than 50% stenosis secondary to
mild multifocal atherosclerotic plaque formation.

2. Left carotid artery system: 50-69% stenosis secondary to moderate
multifocal atherosclerotic plaque formation.

3.  Vertebral artery system: Patent with antegrade flow bilaterally.

## 2021-12-29 MED ORDER — LORAZEPAM 2 MG/ML IJ SOLN
1.0000 mg | Freq: Once | INTRAMUSCULAR | Status: AC
  Administered 2021-12-29: 1 mg via INTRAVENOUS
  Filled 2021-12-29: qty 1

## 2021-12-29 MED ORDER — DIAZEPAM 5 MG/ML IJ SOLN
2.5000 mg | INTRAMUSCULAR | Status: AC
Start: 2021-12-29 — End: 2021-12-29
  Administered 2021-12-29: 2.5 mg via INTRAVENOUS
  Filled 2021-12-29: qty 2

## 2021-12-29 MED ORDER — ACETAMINOPHEN 160 MG/5ML PO SOLN
650.0000 mg | ORAL | Status: DC | PRN
  Filled 2021-12-29: qty 20.3

## 2021-12-29 MED ORDER — MIRTAZAPINE 15 MG PO TABS
15.0000 mg | ORAL_TABLET | Freq: Every day | ORAL | Status: DC
Start: 2021-12-29 — End: 2022-01-11
  Administered 2021-12-29 – 2022-01-10 (×14): 15 mg via ORAL
  Filled 2021-12-29 (×15): qty 1

## 2021-12-29 MED ORDER — ACETAMINOPHEN 325 MG PO TABS
650.0000 mg | ORAL_TABLET | ORAL | Status: DC | PRN
  Administered 2021-12-31 – 2022-01-10 (×3): 650 mg via ORAL
  Filled 2021-12-29 (×4): qty 2

## 2021-12-29 MED ORDER — ATORVASTATIN CALCIUM 20 MG PO TABS
20.0000 mg | ORAL_TABLET | Freq: Every day | ORAL | Status: DC
  Administered 2021-12-29 – 2022-01-11 (×12): 20 mg via ORAL
  Filled 2021-12-29 (×13): qty 1

## 2021-12-29 MED ORDER — SODIUM CHLORIDE 0.9 % IV SOLN: INTRAVENOUS | Status: DC

## 2021-12-29 MED ORDER — HALOPERIDOL LACTATE 5 MG/ML IJ SOLN
1.0000 mg | Freq: Four times a day (QID) | INTRAMUSCULAR | Status: AC | PRN
  Administered 2021-12-29 – 2021-12-30 (×2): 1 mg via INTRAVENOUS
  Filled 2021-12-29 (×3): qty 1

## 2021-12-29 MED ORDER — ACETAMINOPHEN 650 MG RE SUPP: 650.0000 mg | RECTAL | Status: DC | PRN

## 2021-12-29 MED ORDER — QUETIAPINE FUMARATE 25 MG PO TABS
100.0000 mg | ORAL_TABLET | Freq: Every day | ORAL | Status: DC
  Administered 2021-12-29 – 2021-12-30 (×3): 100 mg via ORAL
  Filled 2021-12-29 (×3): qty 4

## 2021-12-29 MED ORDER — ASPIRIN EC 81 MG PO TBEC
81.0000 mg | DELAYED_RELEASE_TABLET | Freq: Every day | ORAL | Status: DC
  Administered 2021-12-29 – 2022-01-11 (×13): 81 mg via ORAL
  Filled 2021-12-29 (×14): qty 1

## 2021-12-29 MED ORDER — SENNOSIDES-DOCUSATE SODIUM 8.6-50 MG PO TABS
1.0000 | ORAL_TABLET | Freq: Every evening | ORAL | Status: DC | PRN
  Administered 2022-01-05: 1 via ORAL
  Filled 2021-12-29: qty 1

## 2021-12-29 MED ORDER — HALOPERIDOL LACTATE 5 MG/ML IJ SOLN
2.5000 mg | Freq: Once | INTRAMUSCULAR | Status: AC
  Administered 2021-12-29: 2.5 mg via INTRAVENOUS
  Filled 2021-12-29: qty 1

## 2021-12-29 MED ORDER — HALOPERIDOL LACTATE 5 MG/ML IJ SOLN
5.0000 mg | Freq: Once | INTRAMUSCULAR | Status: AC
  Administered 2021-12-29: 5 mg via INTRAVENOUS

## 2021-12-29 MED ORDER — STROKE: EARLY STAGES OF RECOVERY BOOK: Freq: Once | Status: AC

## 2021-12-29 MED ORDER — ENOXAPARIN SODIUM 40 MG/0.4ML IJ SOSY
40.0000 mg | PREFILLED_SYRINGE | INTRAMUSCULAR | Status: DC
  Administered 2021-12-29 – 2022-01-11 (×11): 40 mg via SUBCUTANEOUS
  Filled 2021-12-29 (×12): qty 0.4

## 2021-12-29 NOTE — Assessment & Plan Note (Addendum)
Blood pressure stable ?Continue amlodipine ?

## 2021-12-29 NOTE — Assessment & Plan Note (Signed)
Patient with abnormal behavior, suspect mostly related to underlying dementia, possibly new stroke as seen on CT ?Delirium precautions ?Fall and aspiration precautions ?

## 2021-12-29 NOTE — Progress Notes (Addendum)
Cross Cover ?Patient admitted with history dementia experiencing acute delirium aeb patient crawling out of his bedroom window of 2 story home.  Seroquel and remeron as outpatient for dementia treatment. Was seen in neurology clinic and [facility] 10/20/2021 for evaluation of hallucinations and psychosocial interventions for management of these not likely to be helpful in inpatient environment.   ?Security had to be called to bedside for acute agitation, confusion, hallucination and inability to redirect. Total 7.5 mg haldol given and 1 mg ativan. Sitter also ordered ?5638:  Nurse notified of MRI readiness to complete scan.  On call valium dose ordered in case patient to become agitated or combative again ? ?

## 2021-12-29 NOTE — Progress Notes (Signed)
PT Cancellation Note ? ?Patient Details ?Name: Larry Buckley ?MRN: [id number] ?DOB: [birth year] ? ? ?Cancelled Treatment:    Reason Eval/Treat Not Completed: Patient at procedure or test/unavailable;Patient's level of consciousness ?Chart reviewed.  Pt out of room for [place] earlier today.  On return to room he was snoring heavily, had received Ativan.  Returned later this AM and pt was again out of room for MRI.  Will continue to follow from a distance and attempt to treat when pt is appropriate.   ? ?Kreg Shropshire, DPT ?12/29/2021, 9:37 AM ?

## 2021-12-29 NOTE — Progress Notes (Addendum)
Unable to complete screening assessment and stroke education pt only alert to self and very confused. Will notify incoming shift. Will continue to monitor. ?

## 2021-12-29 NOTE — Assessment & Plan Note (Signed)
CT head concerning for low-attenuation in the right occipital lobe, new since prior study in August 2021 possibly acute ischemia ?Stroke work-up to include continuous cardiac monitoring, echocardiogram, carotid Doppler and MRI ?Neurology consult ?Aspirin, check lipids and A1c ?

## 2021-12-29 NOTE — Assessment & Plan Note (Signed)
Continue atorvastatin

## 2021-12-29 NOTE — Assessment & Plan Note (Signed)
Continue aspirin and atorvastatin. ?

## 2021-12-29 NOTE — H&P (Signed)
?History and Physical  ? ? ?Patient: Larry Buckley JJH:[id number] DOB: 10/22/1940 ?DOA: 12/28/2021 ?DOS: the patient was seen and examined on 12/29/2021 ?PCP: Pcp, No  ?Patient coming from: Home ? ?Chief Complaint:  ?Chief Complaint  ?Patient presents with  ? Altered Mental Status  ? ? ?HPI: Brooke Payes is a 81 y.o. male with medical history significant for HTN, HLD, gout, dementia, AAA repair who was brought to the ED by his daughter due to abnormal behavior over the past 1 week out of keeping for his dementia.  According to report given, he crawled out of the bathroom window and was found by neighbors.  Patient denies complaints and does not know why he was brought to the hospital. ED course: BP 154/65 with otherwise normal vitals.  Labs unremarkable except for slightly elevated blood glucose of 126.  EKG, personally viewed and interpreted: NSR at 63 with nonspecific ST-T wave changes.  CT head concerning for low-attenuation in the right occipital lobe, new since prior study in August 2021 possibly acute ischemia.  Given symptom onset of several days, code stroke not initiated.  Hospitalist consulted for admission.  ? ?Review of Systems: As mentioned in the history of present illness. All other systems reviewed and are negative. ?Past Medical History:  ?Diagnosis Date  ? AAA (abdominal aortic aneurysm)   ? Dementia (HCC) 05/21/2020  ? Gout, arthropathy   ? Hyperlipidemia   ? Hypertension   ? ?Past Surgical History:  ?Procedure Laterality Date  ? PERIPHERAL VASCULAR CATHETERIZATION N/A 02/08/2016  ? Procedure: Endovascular Repair/Stent Graft;  Surgeon: Annice Needy, MD;  Location: [facility] INVASIVE CV LAB;  Service: Cardiovascular;  Laterality: N/A;  ? ?Social History:  reports that he has quit smoking. He has never used smokeless tobacco. He reports current alcohol use. He reports that he does not use drugs. ? ?Allergies  ?Allergen Reactions  ? Lotensin [Benazepril Hcl] Swelling  ? ? ?Family History  ?Problem  Relation Age of Onset  ? Cancer Mother   ? Diabetes Mother   ? Heart disease Father   ? Hypertension Father   ? Heart disease Brother   ? ? ?Prior to Admission medications   ?Medication Sig Start Date End Date Taking? Authorizing Provider  ?amLODipine (NORVASC) 10 MG tablet Take 1 tablet (10 mg total) by mouth daily. 07/11/21  Yes Cannady, Corrie Dandy T, NP  ?aspirin EC 81 MG tablet Take 1 tablet (81 mg total) by mouth daily. 07/11/21  Yes Cannady, Corrie Dandy T, NP  ?atorvastatin (LIPITOR) 20 MG tablet Take 1 tablet (20 mg total) by mouth daily. 07/11/21  Yes Cannady, Corrie Dandy T, NP  ?mirtazapine (REMERON) 15 MG tablet Take 15 mg by mouth at bedtime. 12/09/21  Yes [provider]  ?QUEtiapine (SEROQUEL) 50 MG tablet Take 100 mg by mouth at bedtime. 09/02/21 03/01/22 Yes [provider]  ? ? ?Physical Exam: ?Vitals:  ? 12/28/21 2159 12/28/21 2206  ?BP: (!) 154/65   ?Pulse: 91   ?Resp: 16   ?Temp: 98.3 ?F (36.8 ?C)   ?TempSrc: Oral   ?SpO2: 97%   ?Weight:  90 kg  ?Height:  6\' 2"  (1.88 m)  ? ?Physical Exam ?Vitals and nursing note reviewed.  ?Constitutional:   ?   General: He is not in acute distress. ?   Appearance: Normal appearance.  ?HENT:  ?   Head: Normocephalic and atraumatic.  ?Cardiovascular:  ?   Rate and Rhythm: Normal rate and regular rhythm.  ?   Pulses: Normal  pulses.  ?   Heart sounds: Normal heart sounds. No murmur heard. ?Pulmonary:  ?   Effort: Pulmonary effort is normal.  ?   Breath sounds: Normal breath sounds. No wheezing or rhonchi.  ?Abdominal:  ?   General: Bowel sounds are normal.  ?   Palpations: Abdomen is soft.  ?   Tenderness: There is no abdominal tenderness.  ?Musculoskeletal:     ?   General: No swelling or tenderness. Normal range of motion.  ?   Cervical back: Normal range of motion and neck supple.  ?Skin: ?   General: Skin is warm and dry.  ?Neurological:  ?   General: No focal deficit present.  ?   Mental Status: He is alert. Mental status is at baseline.  ?Psychiatric:     ?    Mood and Affect: Mood normal.     ?   Behavior: Behavior normal.  ? ? ? ?Data Reviewed: ?Relevant notes from primary care and specialist visits, past discharge summaries as available in EHR, including Care Everywhere. ?Prior diagnostic testing as pertinent to current admission diagnoses ?Updated medications and problem lists for reconciliation ?ED course, including vitals, labs, imaging, treatment and response to treatment ?Triage notes, nursing and pharmacy notes and ED provider's notes ?Notable results as noted in HPI ? ? ?Assessment and Plan: ?* Altered mental status ?Patient with abnormal behavior, suspect mostly related to underlying dementia, possibly new stroke as seen on CT ?Delirium precautions ?Fall and aspiration precautions ? ?Abnormal CT of the head, possible acute ischemia ?CT head concerning for low-attenuation in the right occipital lobe, new since prior study in August 2021 possibly acute ischemia ?Stroke work-up to include continuous cardiac monitoring, echocardiogram, carotid Doppler and MRI ?Neurology consult ?Aspirin, check lipids and A1c ? ?Dementia with behavioral disturbance ?Delirium precautions ?Fall and aspiration precautions ?Continue Remeron and Seroquel ?Haldol as needed ? ?History of abdominal aortic aneurysm (AAA) repair ?Continue aspirin and atorvastatin ? ?Gout, arthropathy ?Not currently on any related meds and not acutely flared ? ?Hypertension ?Continue amlodipine ? ?Hyperlipidemia, mixed ?Continue atorvastatin ? ? ? ? ? ? ?Advance Care Planning:   Code Status: Not on file full ? ?Consults: Neurology, Dr. Selina Cooley ? ?Family Communication: none ? ?Severity of Illness: ?The appropriate patient status for this patient is OBSERVATION. Observation status is judged to be reasonable and necessary in order to provide the required intensity of service to ensure the patient's safety. The patient's presenting symptoms, physical exam findings, and initial radiographic and laboratory data in the  context of their medical condition is felt to place them at decreased risk for further clinical deterioration. Furthermore, it is anticipated that the patient will be medically stable for discharge from the hospital within 2 midnights of admission.  ? ?Author: ?Andris Baumann, MD ?12/29/2021 12:38 AM ? ?For on call review [email or web address].  ?

## 2021-12-29 NOTE — Progress Notes (Addendum)
Pt was NPO passed swallow screen test. On assessment pt have +3 pitting edema bilateral on  legs and clear diminished lung sounds and have 0.9 % sodium chloride ordered. NP Jon Billings made aware. Will continue to monitor. ? ?Update 0330: Pt is very confused and agitated and hard to re-direct and trying to get up to leave room but very unsteady on feet.and pulling out lines Security was notified and came at the bedside for assistance. NP Jon Billings made aware.Will contineu to monitor. ? ?MD Para March discontinue fluids at 0332. NP Jon Billings placed order for diet at 0334. ? ? ?Update 0430:NP Morrsion  ordered 2.5 mg IV once. MD Para March placed order for Haldol 1 mg IV as needed every 6 hours for agitation. Will contineu to monitor. ? ?Update 0358: NP Jon Billings added 5 mg IV Haldol once pt still trying to get up and leave room and pulling line. Will continue to monitor. ? ?Update 0412: Pt refused telemonitoring at this time but was educated about its importance, NP Jon Billings made aware. Will  continue to monitor. ? ?Update 0428: Pt still want to leave even with a total of 7.5 mg IV haldol. NP Jon Billings made aware. Will continue to monitor. ? ?Update 0431: NP Jon Billings ordered ativan 1 mg IV once. Will continue to monitor. ? ?Update 0509: Pt asleep at this this time.. Will continue to monitor. ?

## 2021-12-29 NOTE — Progress Notes (Addendum)
SLP Cancellation Note ? ?Patient Details ?Name: Larry Buckley ?MRN: [id number] ?DOB: [birth year] ? ? ?Cancelled treatment:       Reason Eval/Treat Not Completed: Patient not medically ready;Patient's level of consciousness;Fatigue/lethargy limiting ability to participate (chart reviewed) ?Pt is currently lethargic and snoring. He was given Haldol and Ativan early this morning d/t agitation, confusion, hallucination and inability to redirect; Security was called to room. Sitter present currently.  ?Pt has a Baseline of Dementia experiencing acute delirium currently. Per chart notes, pt was crawling out of his bedroom window of 2 story home recently; Seroquel and Remeron as Outpatient for Dementia treatment when seen in [facility] 10/20/2021 for evaluation of hallucinations and psychosocial interventions for management of these.  ?ST services will f/u w/ pt's status tomorrow when able to participate in assessment. Recommend reducing distractions during engagement. ? ? ? ? ? ?Jerilynn Som, MS, CCC-SLP ?Speech Language Pathologist ?Rehab Services; [facility] ?[phone] (ascom) ?Larry Buckley ?12/29/2021, 11:07 AM ?

## 2021-12-29 NOTE — Evaluation (Signed)
Physical Therapy Evaluation ?Patient Details ?Name: Larry Buckley ?MRN: [id number] ?DOB: Nov 09, 1940 ?Today's Date: 12/29/2021 ? ?History of Present Illness ? 81 y.o. male with medical history significant for HTN, HLD, gout, dementia, AAA repair who was brought to the ED by his daughter due to abnormal behavior over the past 1 week out of keeping for his dementia. Imaging reveals possible chronic/subacute occipital ischemia changed from prior imaging over a year ago.  ?Clinical Impression ? Pt was sleeping on arrival but woke easily, sitter present.  He had some self awareness but even with repeated cues and explanation he did not believe/comprehend he was in the hospital. Pt showed very good strength with testing, and was able to circumambulate the nurses station (w/o AD) with relative ease. He showed poor safety awareness and had occasional low grade stagger stepping with ambulation (no overt LOBs.)  will maintain on caseload for further address safety awareness, balance, and maintain activity as he, hopefully, has improved cognition.  Expect he will be close to functional baseline and will not likely need PT f/u at discharge.    ? ?Recommendations for follow up therapy are one component of a multi-disciplinary discharge planning process, led by the attending physician.  Recommendations may be updated based on patient status, additional functional criteria and insurance authorization. ? ?Follow Up Recommendations No PT follow up ? ?  ?Assistance Recommended at Discharge Intermittent Supervision/Assistance  ?Patient can return home with the following ? Assist for transportation;Assistance with cooking/housework ? ?  ?Equipment Recommendations None recommended by PT  ?Recommendations for Other Services ?    ?  ?Functional Status Assessment Patient has had a recent decline in their functional status and demonstrates the ability to make significant improvements in function in a reasonable and predictable amount of  time.  ? ?  ?Precautions / Restrictions Precautions ?Precautions: Fall ?Restrictions ?Weight Bearing Restrictions: No  ? ?  ? ?Mobility ? Bed Mobility ?Overal bed mobility: Modified Independent ?  ?  ?  ?  ?  ?  ?General bed mobility comments: cues to insure safety, but no hesitation or assist needed ?  ? ?Transfers ?Overall transfer level: Modified independent ?Equipment used: None ?  ?  ?  ?  ?  ?  ?  ?General transfer comment: Pt was able to rise to standing w/o assist, good confidence/safety apart from general confusion needing increased cues ?  ? ?Ambulation/Gait ?Ambulation/Gait assistance: Supervision ?Gait Distance (Feet): 200 Feet ?Assistive device: None ?  ?  ?  ?  ?General Gait Details: Pt with slow but consistent and relatively confident cadence.  Vitals remained stable t/o the effort and he did not have any LOBs or overt safety issues (occasional minimal stagger stepping/unsteadiness that he easily self arrests) apart from needing increased  directional cunig and motivation. ? ?Stairs ?  ?  ?  ?  ?  ? ?Wheelchair Mobility ?  ? ?Modified Rankin (Stroke Patients Only) ?  ? ?  ? ?Balance Overall balance assessment: Mild deficits observed, not formally tested ?  ?  ?  ?  ?  ?  ?  ?  ?  ?  ?  ?  ?  ?  ?  ?  ?  ?  ?   ? ? ? ?Pertinent Vitals/Pain Pain Assessment ?Pain Assessment: No/denies pain  ? ? ?Home Living Family/patient expects to be discharged to:: Private residence ?Living Arrangements: Spouse/significant other ?Available Help at Discharge: Family;Available 24 hours/day ?  ?Home Access: Level entry ?  ?  ?  ?  Home Layout: One level ?Home Equipment: Gilmer Mor - single point ?Additional Comments: Pt with general confusion, unsure of veracity of given information  ?  ?Prior Function Prior Level of Function : Independent/Modified Independent;Driving (per pt, unsure how true his community independence truly is) ?  ?  ?  ?  ?  ?  ?  ?  ?  ? ? ?Hand Dominance  ?   ? ?  ?Extremity/Trunk Assessment  ? Upper  Extremity Assessment ?Upper Extremity Assessment: Overall WFL for tasks assessed ?  ? ?Lower Extremity Assessment ?Lower Extremity Assessment: Overall WFL for tasks assessed ?  ? ?   ?Communication  ? Communication: No difficulties  ?Cognition Arousal/Alertness: Awake/alert ?Behavior During Therapy: Restless ?Overall Cognitive Status: No family/caregiver present to determine baseline cognitive functioning (baseline dementia, unsure how current mentation compares) ?  ?  ?  ?  ?  ?  ?  ?  ?  ?  ?  ?  ?  ?  ?  ?  ?General Comments: Pt states name, birthday, Larry Buckley, does not endorse being in the hospital despite direct cuing that he is or even after loop around the nurses' station ?  ?  ? ?  ?General Comments General comments (skin integrity, edema, etc.): Pt did move relatively well and appeared to have good confidence.  Slow lumbering cadence w/o AD, unsure how this compares to baseline. ? ?  ?Exercises    ? ?Assessment/Plan  ?  ?PT Assessment Patient needs continued PT services  ?PT Problem List Decreased mobility;Decreased cognition;Decreased safety awareness ? ?   ?  ?PT Treatment Interventions DME instruction;Stair training   ? ?PT Goals (Current goals can be found in the Care Plan section)  ?Acute Rehab PT Goals ?Patient Stated Goal: none stated ?PT Goal Formulation: With patient ?Time For Goal Achievement: 01/12/22 ?Potential to Achieve Goals: Good ? ?  ?Frequency Min 2X/week ?  ? ? ?Co-evaluation   ?  ?  ?  ?  ? ? ?  ?AM-PAC PT "6 Clicks" Mobility  ?Outcome Measure Help needed turning from your back to your side while in a flat bed without using bedrails?: None ?Help needed moving from lying on your back to sitting on the side of a flat bed without using bedrails?: None ?Help needed moving to and from a bed to a chair (including a wheelchair)?: None ?Help needed standing up from a chair using your arms (e.g., wheelchair or bedside chair)?: None ?Help needed to walk in hospital room?: A Little ?Help needed  climbing 3-5 steps with a railing? : A Little ?6 Click Score: 22 ? ?  ?End of Session Equipment Utilized During Treatment: Gait belt ?Activity Tolerance: Patient tolerated treatment well ?Patient left: in chair;with call bell/phone within reach;with nursing/sitter in room ?Nurse Communication: Mobility status ?PT Visit Diagnosis: Difficulty in walking, not elsewhere classified (R26.2);Unsteadiness on feet (R26.81) ?  ? ?Time: 3016-0109 ?PT Time Calculation (min) (ACUTE ONLY): 18 min ? ? ?Charges:   PT Evaluation ?$PT Eval Low Complexity: 1 Low ?PT Treatments ?$Gait Training: 8-22 mins ?  ?   ? ?Malachi Pro, DPT ?12/29/2021, 5:37 PM ?

## 2021-12-29 NOTE — Assessment & Plan Note (Addendum)
Continue Remeron and Seroquel with as needed Haldol.  Fall, aspiration and delirium precautions.  Psychiatry following.  Unfortunately, unable to go to memory care unit due to financial issues.    Patient usually calm although today, he is refusing labs and some medicines.  Intermittently agitated.  Case management assisting patient's daughter looking for additional resources. ?

## 2021-12-29 NOTE — Progress Notes (Signed)
OT Cancellation Note ? ?Patient Details ?Name: Larry Buckley ?MRN: [id number] ?DOB: 12-30-1940 ? ? ?Cancelled Treatment:    Reason Eval/Treat Not Completed: Patient at procedure or test/ unavailable. Order received and chart reviewed. Initial attempt pt at MRI, second attempt pt snoring loudly, sitter at bedside requesting return later. Will continue to follow to initiate services as able.  ? ?Kathie Dike, M.S. OTR/L  ?12/29/21, 10:41 AM  ?ascom [phone] ?

## 2021-12-29 NOTE — Progress Notes (Signed)
*  PRELIMINARY RESULTS* ?Echocardiogram ?2D Echocardiogram has been performed. ? ?Larry Buckley, [name] ?12/29/2021, 2:40 PM ?

## 2021-12-29 NOTE — Assessment & Plan Note (Signed)
Not currently on any related meds and not acutely flared ?

## 2021-12-29 NOTE — Progress Notes (Signed)
Interval events noted.  At the time of my exam, patient was sleepy/sedated. Vital signs are stable.  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  Plan of care was discussed with Larry Buckley, stepdaughter.  Larry Buckley stated that it has become increasingly difficult to take care of Larry Buckley.  She is hoping that patient will be able to go to rehab or a memory care unit. ?

## 2021-12-30 DIAGNOSIS — R410 Disorientation, unspecified: Secondary | ICD-10-CM

## 2021-12-30 LAB — ECHOCARDIOGRAM COMPLETE
AR max vel: 2.41 cm2
AV Area VTI: 2.73 cm2
AV Area mean vel: 2.46 cm2
AV Mean grad: 4 mmHg
AV Peak grad: 6.9 mmHg
Ao pk vel: 1.32 m/s
Area-P 1/2: 2.5 cm2
Height: 74 in
MV VTI: 1.72 cm2
S' Lateral: 2.6 cm
Weight: 3104.08 oz

## 2021-12-30 LAB — HEMOGLOBIN A1C
Hgb A1c MFr Bld: 6.3 % — ABNORMAL HIGH (ref 4.8–5.6)
Mean Plasma Glucose: 134 mg/dL

## 2021-12-30 MED ORDER — LORAZEPAM 2 MG/ML IJ SOLN
1.0000 mg | Freq: Once | INTRAMUSCULAR | Status: AC
  Administered 2021-12-31: 1 mg via INTRAMUSCULAR

## 2021-12-30 MED ORDER — AMLODIPINE BESYLATE 10 MG PO TABS
10.0000 mg | ORAL_TABLET | Freq: Every day | ORAL | Status: DC
  Administered 2021-12-30 – 2022-01-11 (×12): 10 mg via ORAL
  Filled 2021-12-30 (×13): qty 1

## 2021-12-30 MED ORDER — LORAZEPAM 2 MG/ML IJ SOLN
1.0000 mg | Freq: Once | INTRAMUSCULAR | Status: DC
  Filled 2021-12-30: qty 1

## 2021-12-30 NOTE — TOC Progression Note (Signed)
Transition of Care (TOC) - Progression Note  ? ? ?Patient Details  ?Name: Larry Buckley ?MRN: [id number] ?Date of Birth: [birth year] ? ?Transition of Care (TOC) CM/SW Contact  ?Caryn Section, RN ?Phone Number: ?12/30/2021, 3:50 PM ? ?Clinical Narrative:   Daughter [name] called RNCM.  She states that patient is her step father and her mother passed away in 2022-11-23.  Patient has no children and his siblings have dementia and are unable to care for him. ? ?Daughter states her current situation is that she is unable to care for patient because he is restless and with his memory disorder, she cannot leave him alone.  She states that he is possibly qualifying for Medicaid but he is borderline qualified and does not have funding to self pay for memory care. ? ?RNCM provided daughter with resources for [employer], [employer], and [employer] 24/7 Helpline to provide patient with assistance for care.  Daughter was quite Adult nurse and states she will begin to look into resources today.  She also plans to view [facility] this weekend to see if they are an option for placement. ? ?Daughter states she would like a follow up call from [facility] this weekend.  Message left in handoff notes. ? ? ? ?  ?  ? ?Expected Discharge Plan and Services ?  ?  ?  ?  ?  ?                ?  ?  ?  ?  ?  ?  ?  ?  ?  ?  ? ? ?Social Determinants of Health (SDOH) Interventions ?  ? ?Readmission Risk Interventions ?No flowsheet data found. ? ?

## 2021-12-30 NOTE — Evaluation (Addendum)
Clinical/Bedside Swallow Evaluation ?Patient Details  ?Name: Larry Buckley ?MRN: [id number] ?Date of Birth: [birth year] ? ?Today's Date: 12/30/2021 ?Time: SLP Start Time (ACUTE ONLY): 1045 SLP Stop Time (ACUTE ONLY): 1130 ?SLP Time Calculation (min) (ACUTE ONLY): 45 min ? ?Past Medical History:  ?Past Medical History:  ?Diagnosis Date  ? AAA (abdominal aortic aneurysm)   ? Dementia (HCC) 05/21/2020  ? Gout, arthropathy   ? Hyperlipidemia   ? Hypertension   ? ?Past Surgical History:  ?Past Surgical History:  ?Procedure Laterality Date  ? PERIPHERAL VASCULAR CATHETERIZATION N/A 02/08/2016  ? Procedure: Endovascular Repair/Stent Graft;  Surgeon: Annice Needy, MD;  Location: [facility] INVASIVE CV LAB;  Service: Cardiovascular;  Laterality: N/A;  ? ?HPI:  ?Pt is a 81 y.o. male with medical history significant for Dementia, HTN, HLD, gout, AAA repair who was brought to the ED by his daughter due to abnormal behavior over the past 1 week out of keeping for his dementia.  According to report given, he crawled out of the bathroom window and was found by neighbors.  Patient denies complaints and does not know why he was brought to the hospital. ED course: BP 154/65 with otherwise normal vitals.  Labs unremarkable except for slightly elevated blood glucose of 126.  EKG, personally viewed and interpreted: NSR at 63 with nonspecific ST-T wave changes.  MRI: New but chronic small right occipital infarct.     No acute infarction, hemorrhage, or mass. Chronic microvascular  ischemic changes and parenchymal volume loss.  Given symptom onset of several days, code stroke not initiated.  Hospitalist consulted for admission.  CXR: No active disease.   ? ?At admit, post Haldol and Ativan given d/t agitation, confusion, hallucination and inability to redirect.  Sitter necessary then; improved mental status currently, and no Sitter.  Pt was recently being seen at [facility] for management of Dementia per chart notes.  ? ?  ?Assessment /  Plan / Recommendation  ?Clinical Impression ? Pt seen for BSE this morning. Diet order recently placed by MD post calming of behaviors. Pt awake w/ pants/boots on. He sat EOB w/out difficulty for BSE session w/ SLP. He perseverated on what time it was; only oriented to self. Pt has a Baseline of Dementia.  ?Pt appears to present w/ adequate oropharyngeal phase swallow function w/ No overt oropharyngeal phase dysphagia noted, No neuromuscular deficits noted. Pt consumed po trials w/ No overt, clinical s/s of aspiration during po trials. Pt does have Baseline Dementia; any Cognitive decline can impact overall awareness/timing of swallow and safety during po tasks which increases risk for aspiration, choking. Pt appears at reduced risk for aspiration following general aspiration precautions.  ? ?During po trials, pt consumed all consistencies w/ no overt coughing, decline in vocal quality, or change in respiratory presentation during/post trials. Oral phase appeared [facility] w/ timely bolus management, mastication, and control of bolus propulsion for A-P transfer for swallowing. Oral clearing achieved w/ all trial consistencies. OM Exam appeared [facility] w/ no unilateral weakness noted. Speech Clear. Pt fed self w/ setup support. After only few trials of foods, pt refused further but requested a soda to drink which was provided. Encouraged him to order Lunch w/ this Clinician assisting, but he declined to.  ? ?Recommend continue a Regular consistency diet w/ well-Cut meats, moistened foods; Thin liquids. Recommend general aspiration precautions, Pills WHOLE in Puree IF any difficulty swallowing w/ liquids. Support w/ setup at meals as needed; reduce distractions. Education given on Pills in  Puree; food consistencies and easy to eat options; general aspiration precautions. NSG to reconsult if any new needs arise. NSG agreed. ?SLP Visit Diagnosis: Dysphagia, unspecified (R13.10) ? ?Regarding any Cognitive-linguistic evaluation,  recommend pt have f/u for such if indicated once pt is in his structured setting post acuity of illness in the setting of Baseline Dementia; any new change/illness can impact Cognitive status leading to increased Confusion during the stressful event(s).  ? ?   ?Aspiration Risk ?  (reduced)  ?  ?Diet Recommendation   Regular consistency diet w/ well-Cut meats, moistened foods; Thin liquids. Recommend general aspiration precautions, support w/ setup at meals as needed; reduce distractions.  ? ?Medication Administration: Whole meds with liquid (or, w/ Puree if any difficulty swallowing w/ liquids)  ?  ?Other  Recommendations Recommended Consults:  (dietician f/u as needed; Neurology f/u for management) ?Oral Care Recommendations: Oral care BID;Oral care before and after PO;Patient independent with oral care (setup support) ?Other Recommendations:  (n/a)   ? ?Recommendations for follow up therapy are one component of a multi-disciplinary discharge planning process, led by the attending physician.  Recommendations may be updated based on patient status, additional functional criteria and insurance authorization. ? ?Follow up Recommendations No SLP follow up for swallowing ? ? ?  ?Assistance Recommended at Discharge PRN  ?Functional Status Assessment Patient has had a recent decline in their functional status and demonstrates the ability to make significant improvements in function in a reasonable and predictable amount of time.  ?Frequency and Duration  (n/a)  ? (n/a) ?  ?   ? ?Prognosis Prognosis for Safe Diet Advancement: Good ?Barriers to Reach Goals: Cognitive deficits;Time post onset;Severity of deficits;Behavior  ? ?  ? ?Swallow Study   ?General Date of Onset: 12/28/21 ?HPI: Pt is a 81 y.o. male with medical history significant for Dementia, HTN, HLD, gout, AAA repair who was brought to the ED by his daughter due to abnormal behavior over the past 1 week out of keeping for his dementia.  According to report given,  he crawled out of the bathroom window and was found by neighbors.  Patient denies complaints and does not know why he was brought to the hospital. ED course: BP 154/65 with otherwise normal vitals.  Labs unremarkable except for slightly elevated blood glucose of 126.  EKG, personally viewed and interpreted: NSR at 63 with nonspecific ST-T wave changes.  MRI: New but chronic small right occipital infarct.     No acute infarction, hemorrhage, or mass. Chronic microvascular  ischemic changes and parenchymal volume loss.  Given symptom onset of several days, code stroke not initiated.  Hospitalist consulted for admission.  CXR: No active disease.  At admit, post Haldol and Ativan given d/t agitation, confusion, hallucination and inability to redirect.  Sitter necessary then; improved mental status currently, and no Sitter.  [facility] per chart notes. ?Type of Study: Bedside Swallow Evaluation ?Previous Swallow Assessment: none ?Diet Prior to this Study: Regular;Thin liquids (order recently put in by MD d/t improved mental status) ?Temperature Spikes Noted: No (wbc 5.3) ?Respiratory Status: Room air ?History of Recent Intubation: No ?Behavior/Cognition: Alert;Cooperative;Pleasant mood;Confused;Distractible;Requires cueing (baseline Dementia) ?Oral Cavity Assessment: Within Functional Limits ?Oral Care Completed by SLP: Recent completion by staff ?Oral Cavity - Dentition: Adequate natural dentition ?Vision: Functional for self-feeding ?Self-Feeding Abilities: Able to feed self;Needs set up ?Patient Positioning: Upright in bed (EOB) ?Baseline Vocal Quality: Normal ?Volitional Cough: Strong ?Volitional Swallow: Able  to elicit  ?  ?Oral/Motor/Sensory Function Overall Oral Motor/Sensory Function: Within functional limits   ?Ice Chips Ice chips: Within functional limits ?Presentation: Spoon (fed; 2 trials)   ?Thin Liquid Thin Liquid: Within functional  limits ?Presentation: Cup;Self Fed;Straw (~8+ ozs total) ?Other Comments: water, juice soda  ?  ?Nectar Thick Nectar Thick Liquid: Not tested   ?Honey Thick Honey Thick Liquid: Not tested   ?Puree Puree: Within functi

## 2021-12-30 NOTE — Progress Notes (Signed)
Patient continually attempts to leave his room and the floor. He is agitated and difficult to re-orientate. His order for PRN Haldol is complete. Notified on-call NP.  ?

## 2021-12-30 NOTE — Evaluation (Signed)
Occupational Therapy Evaluation ?Patient Details ?Name: Larry Buckley ?MRN: [id number] ?DOB: [birth year] ?Today's Date: 12/30/2021 ? ? ?History of Present Illness 81 y.o. male with medical history significant for HTN, HLD, gout, dementia, AAA repair who was brought to the ED by his daughter due to abnormal behavior over the past 1 week out of keeping for his dementia. Imaging reveals possible chronic/subacute occipital ischemia changed from prior imaging over a year ago.  ? ?Clinical Impression ?  ?Larry Buckley was seen for OT evaluation this date. Pt is poor historian however reports no AD use at baseline and 24/7 family at home. Pt presents to acute OT demonstrating impaired ADL performance and functional mobility 2/2 functional cognitive deficits. Pt currently requires SUPERVISION + MIN cues to locate personal belongings bag and clothing - pt fixated on donning own clothes. Completes LBD sitting/standing MOD I, no LOBs. Pt demosntrates difficulty with object naming - asks if pillow is a bag and if blanket is his jacket. Repeated cues to locate items. SUPERVISION for ADL t/f and hallway mobility, cues to locate room as pt does not remember room number greater than ~10 sec. Pt would benefit from skilled OT to address noted impairments and functional limitations (see below for any additional details). Upon hospital discharge, recommend Outpatient OT to maximize pt safety and return to PLOF. ?   ? ?Recommendations for follow up therapy are one component of a multi-disciplinary discharge planning process, led by the attending physician.  Recommendations may be updated based on patient status, additional functional criteria and insurance authorization.  ? ?Follow Up Recommendations ? Outpatient OT  ?  ?Assistance Recommended at Discharge Intermittent Supervision/Assistance  ?Patient can return home with the following Assistance with cooking/housework;Direct supervision/assist for medications management ? ?  ?Functional  Status Assessment ? Patient has had a recent decline in their functional status and demonstrates the ability to make significant improvements in function in a reasonable and predictable amount of time.  ?Equipment Recommendations ? None recommended by OT  ?  ?Recommendations for Other Services   ? ? ?  ?Precautions / Restrictions Precautions ?Precautions: Fall ?Restrictions ?Weight Bearing Restrictions: No  ? ?  ? ?Mobility Bed Mobility ?Overal bed mobility: Independent ?  ?  ?  ?  ?  ?  ?  ?  ? ?Transfers ?Overall transfer level: Independent ?Equipment used: None ?  ?  ?  ?  ?  ?  ?  ?  ?  ? ?  ?Balance Overall balance assessment: Mild deficits observed, not formally tested ?  ?  ?  ?  ?  ?  ?  ?  ?  ?  ?  ?  ?  ?  ?  ?  ?  ?  ?   ? ?ADL either performed or assessed with clinical judgement  ? ?ADL Overall ADL's : Needs assistance/impaired ?  ?  ?  ?  ?  ?  ?  ?  ?  ?  ?  ?  ?  ?  ?  ?  ?  ?  ?  ?General ADL Comments: SUPERVISION + MIN cues to locate personal belongings bag and clothing - pt fixated on donning own clothes. Completes LBD sitting/standing MOD I, no LOBs. Pt demosntrates difficulty with object naming - asks if pillow is a bag and if blanket is his jacket. Repeated cues to locate items. SUPERVISION for ADL t/f and hallway mobility, cues to locate room as pt does not remember room number greater than ~  10 sec  ? ? ? ?   ?   ?   ?   ? ?Pertinent Vitals/Pain Pain Assessment ?Pain Assessment: No/denies pain  ? ? ? ?Hand Dominance   ?  ?Extremity/Trunk Assessment Upper Extremity Assessment ?Upper Extremity Assessment: Overall WFL for tasks assessed ?  ?Lower Extremity Assessment ?Lower Extremity Assessment: Overall WFL for tasks assessed ?  ?  ?  ?Communication Communication ?Communication: No difficulties ?  ?Cognition Arousal/Alertness: Awake/alert ?Behavior During Therapy: Restless ?Overall Cognitive Status: No family/caregiver present to determine baseline cognitive functioning ?  ?  ?  ?  ?  ?  ?  ?  ?   ?  ?  ?  ?  ?  ?  ?  ?General Comments: states location as [place], not aware he is in the hospital ?  ?  ? ?Home Living Family/patient expects to be discharged to:: Private residence ?Living Arrangements: Spouse/significant other ?Available Help at Discharge: Family;Available 24 hours/day ?Type of Home: House ?Home Access: Level entry ?  ?  ?Home Layout: One level ?  ?  ?  ?  ?  ?  ?  ?Home Equipment: Kasandra Knudsen - single point ?  ?Additional Comments: Pt with general confusion, unsure of veracity of given information ?  ? ?  ?Prior Functioning/Environment Prior Level of Function : Independent/Modified Independent;Driving ?  ?  ?  ?  ?  ?  ?  ?  ?  ? ?  ?  ?OT Problem List: Decreased cognition;Decreased safety awareness ?  ?   ?OT Treatment/Interventions: Self-care/ADL training;Therapeutic exercise;DME and/or AE instruction;Energy conservation;Therapeutic activities;Patient/family education;Balance training;Cognitive remediation/compensation  ?  ?OT Goals(Current goals can be found in the care plan section) Acute Rehab OT Goals ?Patient Stated Goal: to go home ?OT Goal Formulation: With patient ?Time For Goal Achievement: 01/13/22 ?Potential to Achieve Goals: Good ?ADL Goals ?Additional ADL Goal #1: Pt will identify 3/3 risk factors for falls prevention ?Additional ADL Goal #2: Pt will complete pill box test with min cues  ?OT Frequency: Min 2X/week ?  ? ?Co-evaluation   ?  ?  ?  ?  ? ?  ?AM-PAC OT "6 Clicks" Daily Activity     ?Outcome Measure Help from another person eating meals?: None ?Help from another person taking care of personal grooming?: None ?Help from another person toileting, which includes using toliet, bedpan, or urinal?: A Little ?Help from another person bathing (including washing, rinsing, drying)?: A Little ?Help from another person to put on and taking off regular upper body clothing?: None ?Help from another person to put on and taking off regular lower body clothing?: None ?6 Click Score: 22 ?   ?End of Session   ? ?Activity Tolerance: Patient tolerated treatment well ?Patient left: in bed;with call bell/phone within reach;with bed alarm set ? ?OT Visit Diagnosis: Other abnormalities of gait and mobility (R26.89)  ?              ?Time: [id number] ?OT Time Calculation (min): 27 min ?Charges:  OT General Charges ?$OT Visit: 1 Visit ?OT Evaluation ?$OT Eval Low Complexity: 1 Low ?OT Treatments ?$Self Care/Home Management : 8-22 mins ? ?Dessie Coma, M.S. OTR/L  ?12/30/21, 10:30 AM  ?ascom [phone] ? ?

## 2021-12-30 NOTE — Progress Notes (Addendum)
? ? ? ?Progress Note  ? ? ?Larry Buckley  [id number]:[id number] DOB: November 04, 1940  DOA: 12/28/2021 ?PCP: Pcp, No  ? ? ? ? ?Brief Narrative:  ? ? ?Medical records reviewed and are as summarized below: ? ?Larry Buckley is a 81 y.o. male with medical history significant for hypertension, hyperlipidemia, gout, stroke (old infarcts on MRI) dementia, AAA repair, who was brought to the hospital because of worsening abnormal behavior for about 1 week.  Reportedly, he crawled out of his bathroom window and was found by his neighbors. ? ?CT head was concerning for acute stroke.  However MRI brain did not show any evidence of acute stroke ? ? ? ? ? ? ?Assessment/Plan:  ? ?Principal Problem: ?  Altered mental status ?Active Problems: ?  Abnormal CT of the head, possible acute ischemia ?  Dementia with behavioral disturbance ?  Hyperlipidemia, mixed ?  Hypertension ?  Gout, arthropathy ?  History of abdominal aortic aneurysm (AAA) repair ? ? ?Body mass index is 24.91 kg/m?. ? ? ?Dementia with behavioral disturbance: No evidence of acute stroke on MRI brain.  Continue Remeron and Seroquel.  Unfortunately, [name], stepdaughter, said she is not capable of taking care of the patient at home anymore.  PT and OT did not recommend SNF.  [name] is looking into sending patient into a memory care unit.  I explained that she may have to find private caregivers if attempt to place her in the memory care unit is unsuccessful.  Follow-up with case manager to assist with disposition. ? ?Hypertension: Continue amlodipine ? ?Other comorbidities include hyperlipidemia, gout, AAA repair ? ? ?Diet Order   ? ?       ?  Diet Heart Room service appropriate? Yes; Fluid consistency: Thin  Diet effective now       ?  ? ?  ?  ? ?  ? ? ? ? ? ? ? ?Consultants: ?None ? ?Procedures: ?None ? ? ? ?Medications:  ? ? amLODipine  10 mg Oral Daily  ? aspirin EC  81 mg Oral Daily  ? atorvastatin  20 mg Oral Daily  ? enoxaparin (LOVENOX) injection  40 mg Subcutaneous  Q24H  ? mirtazapine  15 mg Oral QHS  ? QUEtiapine  100 mg Oral QHS  ? ?Continuous Infusions: ? ? ?Anti-infectives (From admission, onward)  ? ? None  ? ?  ? ? ? ? ? ? ? ? ? ?Family Communication/Anticipated D/C date and plan/Code Status  ? ?DVT prophylaxis: enoxaparin (LOVENOX) injection 40 mg Start: 12/29/21 0800 ? ? ?  Code Status: Full Code ? ?Family Communication: Plan discussed with [name], stepdaughter ?Disposition Plan: Plan to discharge to memory care unit ? ? ?Status is: Observation ?The patient will require care spanning > 2 midnights and should be moved to inpatient because: Unsafe discharge plan ? ? ? ? ? ? ?Subjective:  ? ?Interval events noted.  He is confused and unable to provide an adequate history. ? ?Objective:  ? ? ?Vitals:  ? 12/29/21 2053 12/30/21 0628 12/30/21 0804 12/30/21 1600  ?BP: (!) 143/65 (!) 145/71 (!) 154/74 (!) 161/87  ?Pulse: 60 (!) 55 (!) 54 61  ?Resp: 16 16 18 17   ?Temp: 98.1 ?F (36.7 ?C) 98 ?F (36.7 ?C) 97.8 ?F (36.6 ?C) 98.1 ?F (36.7 ?C)  ?TempSrc: Oral Oral    ?SpO2: 100% 98% 100% 99%  ?Weight:      ?Height:      ? ?No data found. ? ? ?Intake/Output Summary (  Last 24 hours) at 12/30/2021 1701 ?Last data filed at 12/30/2021 1422 ?Gross per 24 hour  ?Intake 240 ml  ?Output --  ?Net 240 ml  ? ?Filed Weights  ? 12/28/21 2206 12/29/21 0258  ?Weight: 90 kg 88 kg  ? ? ?Exam: ? ?GEN: NAD ?SKIN: Warm and dry ?EYES: EOMI ?ENT: MMM ?CV: RRR ?PULM: CTA B ?ABD: soft, ND, NT, +BS ?CNS: AAO x 1 (person), non focal ?EXT: No edema or tenderness ? ? ? ?  ? ? ?Data Reviewed:  ? ?I have personally reviewed following labs and imaging studies: ? ?Labs: ?Labs show the following:  ? ?Basic Metabolic Panel: ?Recent Labs  ?Lab 12/28/21 ?2210 12/29/21 ?0041  ?NA 140 140  ?K 4.1 4.0  ?CL 104 105  ?CO2 28 29  ?GLUCOSE 126* 123*  ?BUN 16 14  ?CREATININE 0.99 0.85  ?CALCIUM 8.9 8.8*  ? ?GFR ?Estimated Creatinine Clearance: 79.2 mL/min (by C-G formula based on SCr of 0.85 mg/dL). ?Liver Function Tests: ?Recent  Labs  ?Lab 12/28/21 ?2210  ?AST 18  ?ALT 14  ?ALKPHOS 60  ?BILITOT 0.3  ?PROT 7.7  ?ALBUMIN 3.5  ? ?No results for input(s): LIPASE, AMYLASE in the last 168 hours. ?No results for input(s): AMMONIA in the last 168 hours. ?Coagulation profile ?Recent Labs  ?Lab 12/29/21 ?0041  ?INR 1.0  ? ? ?CBC: ?Recent Labs  ?Lab 12/28/21 ?2210  ?WBC 5.3  ?NEUTROABS 2.9  ?HGB 13.3  ?HCT 41.8  ?MCV 94.4  ?PLT 308  ? ?Cardiac Enzymes: ?No results for input(s): CKTOTAL, CKMB, CKMBINDEX, TROPONINI in the last 168 hours. ?BNP (last 3 results) ?No results for input(s): PROBNP in the last 8760 hours. ?CBG: ?Recent Labs  ?Lab 12/29/21 ?0248  ?GLUCAP 133*  ? ?D-Dimer: ?No results for input(s): DDIMER in the last 72 hours. ?Hgb A1c: ?Recent Labs  ?  12/29/21 ?0054  ?HGBA1C 6.3*  ? ?Lipid Profile: ?Recent Labs  ?  12/29/21 ?0710  ?CHOL 131  ?HDL 49  ?LDLCALC 73  ?TRIG 44  ?CHOLHDL 2.7  ? ?Thyroid function studies: ?No results for input(s): TSH, T4TOTAL, T3FREE, THYROIDAB in the last 72 hours. ? ?Invalid input(s): FREET3 ?Anemia work up: ?No results for input(s): VITAMINB12, FOLATE, FERRITIN, TIBC, IRON, RETICCTPCT in the last 72 hours. ?Sepsis Labs: ?Recent Labs  ?Lab 12/28/21 ?2210  ?WBC 5.3  ? ? ?Microbiology ?Recent Results (from the past 240 hour(s))  ?Resp Panel by RT-PCR (Flu A&B, Covid) Nasopharyngeal Swab     Status: None  ? Collection Time: 12/29/21 12:41 AM  ? Specimen: Nasopharyngeal Swab; Nasopharyngeal(NP) swabs in vial transport medium  ?Result Value Ref Range Status  ? SARS Coronavirus 2 by RT PCR NEGATIVE NEGATIVE Final  ?  Comment: (NOTE) ?SARS-CoV-2 target nucleic acids are NOT DETECTED. ? ?The SARS-CoV-2 RNA is generally detectable in upper respiratory ?specimens during the acute phase of infection. The lowest ?concentration of SARS-CoV-2 viral copies this assay can detect is ?138 copies/mL. A negative result does not preclude SARS-Cov-2 ?infection and should not be used as the sole basis for treatment or ?other patient  management decisions. A negative result may occur with  ?improper specimen collection/handling, submission of specimen other ?than nasopharyngeal swab, presence of viral mutation(s) within the ?areas targeted by this assay, and inadequate number of viral ?copies(<138 copies/mL). A negative result must be combined with ?clinical observations, patient history, and epidemiological ?information. The expected result is Negative. ? ?Fact Sheet for Patients:  ?BloggerCourse.comhttps://www.fda.gov/media/152166/download ? ?Fact Sheet for Healthcare Providers:  ?SeriousBroker.ithttps://www.fda.gov/media/152162/download ? ?  This test is no t yet approved or cleared by the [place] FDA and  ?has been authorized for detection and/or diagnosis of SARS-CoV-2 by ?FDA under an Emergency Use Authorization (EUA). This EUA will remain  ?in effect (meaning this test can be used) for the duration of the ?COVID-19 declaration under Section 564(b)(1) of the Act, 21 ?U.S.C.section 360bbb-3(b)(1), unless the authorization is terminated  ?or revoked sooner.  ? ? ?  ? Influenza A by PCR NEGATIVE NEGATIVE Final  ? Influenza B by PCR NEGATIVE NEGATIVE Final  ?  Comment: (NOTE) ?The Xpert Xpress SARS-CoV-2/FLU/RSV plus assay is intended as an aid ?in the diagnosis of influenza from Nasopharyngeal swab specimens and ?should not be used as a sole basis for treatment. Nasal washings and ?aspirates are unacceptable for Xpert Xpress SARS-CoV-2/FLU/RSV ?testing. ? ?Fact Sheet for Patients: ?BloggerCourse.com ? ?Fact Sheet for Healthcare Providers: ?SeriousBroker.it ? ?This test is not yet approved or cleared by the [place] FDA and ?has been authorized for detection and/or diagnosis of SARS-CoV-2 by ?FDA under an Emergency Use Authorization (EUA). This EUA will remain ?in effect (meaning this test can be used) for the duration of the ?COVID-19 declaration under Section 564(b)(1) of the Act, 21 U.S.C. ?section 360bbb-3(b)(1),  unless the authorization is terminated or ?revoked. ? ?Performed at [facility], [address] [place], ?[place] [address] ?  ? ? ?Procedures and diagnostic studies: ? ?CT Head Wo Contrast ? ?Resul

## 2021-12-30 NOTE — Progress Notes (Addendum)
Cross Cover ?Dose of ativan ordered for severe agitation. Historically haldol has been ineffective and patient refuses to wear tele monitor for QT monitoring ? ?0416 patient has required multiple ativan doses and was given one dose of haldol and still remains agitated. Sitter has been ordered, however he is difficult to redirect per nursing report. ?

## 2021-12-30 NOTE — Progress Notes (Signed)
Physical Therapy Treatment ?Patient Details ?Name: Larry [place] ?MRN: [id number] ?DOB: 04/30/1941 ?Today's Date: 12/30/2021 ? ? ?History of Present Illness 81 y.o. male with medical history significant for HTN, HLD, gout, dementia, AAA repair who was brought to the ED by his daughter due to abnormal behavior over the past 1 week out of keeping for his dementia. Imaging reveals possible chronic/subacute occipital ischemia changed from prior imaging over a year ago. ? ?  ?PT Comments  ? ? Pt received sitting edge of bed, pleasantly confused and hallucinating that his brother is in the room. Pt agreeable to mobility. Pt independent with bed mobility and transfers. Gait training without AD 59ft with supervision, no LOB. Pt returned to room, assisted back to bed, bed alarm on, and call bell in reach. Continue PT per POC. ?   ?Recommendations for follow up therapy are one component of a multi-disciplinary discharge planning process, led by the attending physician.  Recommendations may be updated based on patient status, additional functional criteria and insurance authorization. ? ?Follow Up Recommendations ? No PT follow up ?  ?  ?Assistance Recommended at Discharge Intermittent Supervision/Assistance  ?Patient can return home with the following Assist for transportation;Assistance with cooking/housework ?  ?Equipment Recommendations ? None recommended by PT  ?  ?Recommendations for Other Services   ? ? ?  ?Precautions / Restrictions Precautions ?Precautions: Fall ?Restrictions ?Weight Bearing Restrictions: No  ?  ? ?Mobility ? Bed Mobility ?Overal bed mobility: Independent ?  ?  ?  ?  ?  ?  ?  ?  ? ?Transfers ?Overall transfer level: Independent ?Equipment used: None ?  ?  ?  ?  ?  ?  ?  ?  ?  ? ?Ambulation/Gait ?Ambulation/Gait assistance: Supervision ?Gait Distance (Feet): 500 Feet ?Assistive device: None ?Gait Pattern/deviations: WFL(Within Functional Limits) ?  ?  ?  ?  ? ? ?Stairs ?  ?  ?  ?  ?   ? ? ?Wheelchair Mobility ?  ? ?Modified Rankin (Stroke Patients Only) ?  ? ? ?  ?Balance Overall balance assessment: Mild deficits observed, not formally tested ?  ?  ?  ?  ?  ?  ?  ?  ?  ?  ?  ?  ?  ?  ?  ?  ?  ?  ?  ? ?  ?Cognition Arousal/Alertness: Awake/alert ?Behavior During Therapy: [facility] for tasks assessed/performed ?Overall Cognitive Status: No family/caregiver present to determine baseline cognitive functioning ?  ?  ?  ?  ?  ?  ?  ?  ?  ?  ?  ?  ?  ?  ?  ?  ?General Comments: Not aware that he is in the hospital and keeps asking questions about his brother sitting next to him who is not present ?  ?  ? ?  ?Exercises   ? ?  ?General Comments General comments (skin integrity, edema, etc.): Repeated vc's to stay on task ?  ?  ? ?Pertinent Vitals/Pain Pain Assessment ?Pain Assessment: No/denies pain  ? ? ?Home Living   ?  ?  ?  ?  ?  ?  ?  ?  ?  ?   ?  ?Prior Function    ?  ?  ?   ? ?PT Goals (current goals can now be found in the care plan section)   ? ?  ?Frequency ? ? ? Min 2X/week ? ? ? ?  ?PT Plan Current plan remains  appropriate  ? ? ?Co-evaluation   ?  ?  ?  ?  ? ?  ?AM-PAC PT "6 Clicks" Mobility   ?Outcome Measure ? Help needed turning from your back to your side while in a flat bed without using bedrails?: None ?Help needed moving from lying on your back to sitting on the side of a flat bed without using bedrails?: None ?Help needed moving to and from a bed to a chair (including a wheelchair)?: None ?Help needed standing up from a chair using your arms (e.g., wheelchair or bedside chair)?: None ?Help needed to walk in hospital room?: A Little ?Help needed climbing 3-5 steps with a railing? : A Little ?6 Click Score: 22 ? ?  ?End of Session Equipment Utilized During Treatment: Gait belt ?Activity Tolerance: Patient tolerated treatment well ?Patient left: with call bell/phone within reach;in bed;with bed alarm set ?Nurse Communication: Mobility status ?PT Visit Diagnosis: Difficulty in walking, not  elsewhere classified (R26.2);Unsteadiness on feet (R26.81) ?  ? ? ?Time: 4650-3546 ?PT Time Calculation (min) (ACUTE ONLY): 15 min ? ?Charges:  $Gait Training: 8-22 mins          ?          ?Zadie Cleverly, PTA ? ? ? ?[place]ry [place] ?12/30/2021, 2:36 PM ? ?

## 2021-12-30 NOTE — Care Management (Signed)
TOC left message for Larry [place] to discuss disposition, as there is no PT OT follow up indicated for this patient. ?

## 2021-12-31 DIAGNOSIS — F03918 Unspecified dementia, unspecified severity, with other behavioral disturbance: Secondary | ICD-10-CM | POA: Diagnosis not present

## 2021-12-31 MED ORDER — QUETIAPINE FUMARATE 25 MG PO TABS
75.0000 mg | ORAL_TABLET | Freq: Two times a day (BID) | ORAL | Status: DC
  Administered 2021-12-31: 75 mg via ORAL
  Filled 2021-12-31: qty 3

## 2021-12-31 MED ORDER — LORAZEPAM 2 MG/ML IJ SOLN
2.0000 mg | Freq: Once | INTRAMUSCULAR | Status: AC
  Administered 2021-12-31: 2 mg via INTRAMUSCULAR
  Filled 2021-12-31: qty 1

## 2021-12-31 MED ORDER — LORAZEPAM 2 MG/ML IJ SOLN: 2.0000 mg | Freq: Once | INTRAMUSCULAR | Status: DC

## 2021-12-31 MED ORDER — HALOPERIDOL LACTATE 5 MG/ML IJ SOLN
5.0000 mg | Freq: Four times a day (QID) | INTRAMUSCULAR | Status: DC | PRN
  Administered 2021-12-31 – 2022-01-08 (×12): 5 mg via INTRAMUSCULAR
  Filled 2021-12-31 (×12): qty 1

## 2021-12-31 MED ORDER — RISPERIDONE 0.5 MG PO TABS
0.5000 mg | ORAL_TABLET | Freq: Two times a day (BID) | ORAL | Status: DC
  Administered 2021-12-31 – 2022-01-11 (×20): 0.5 mg via ORAL
  Filled 2021-12-31 (×21): qty 1

## 2021-12-31 MED ORDER — HALOPERIDOL LACTATE 5 MG/ML IJ SOLN
5.0000 mg | Freq: Once | INTRAMUSCULAR | Status: AC
  Administered 2021-12-31: 5 mg via INTRAMUSCULAR
  Filled 2021-12-31: qty 1

## 2021-12-31 MED ORDER — DIVALPROEX SODIUM 125 MG PO CSDR
125.0000 mg | DELAYED_RELEASE_CAPSULE | Freq: Two times a day (BID) | ORAL | Status: DC
  Administered 2021-12-31 – 2022-01-11 (×20): 125 mg via ORAL
  Filled 2021-12-31 (×22): qty 1

## 2021-12-31 MED ORDER — HALOPERIDOL LACTATE 5 MG/ML IJ SOLN: 5.0000 mg | Freq: Once | INTRAMUSCULAR | Status: DC

## 2021-12-31 NOTE — Consult Note (Addendum)
[facility] Face-[facility]iat[facility] Consult  ? ?Reason for Consult:  dementia with agitation ?Referring Physician:  DR Myriam ForehandAyiku ?Patient Identification: Larry PocheWilliam Henry Huseman ?MRN:  [id number] ?Principal Diagnosis: Altered mental status ?Diagnosis:  Principal Problem: ?  Altered mental status ?Active Problems: ?  Hyperlipidemia, mixed ?  Hypertension ?  Gout, arthropathy ?  History of abdominal aortic aneurysm (AAA) repair ?  Dementia with behavioral disturbance ?  Abnormal CT of the head, possible acute ischemia ? ? ?Total Time spent with patient: 45 minutes ? ?Subjective:   ?Larry [place] is a 81 y.o. male patient admitted with AMS, history of dementia. ? ?HPI:  81 yo male admitted for AMS, history of dementia.  Alert on assessment and sitter reports he tries to get out of bed frequently, agitated at times.  Behaviors are consistent with his dementia diagnosis.  Client denies suicidal/homicidal ideations, hallucinations, and substance abuse.  His daughter is currently caring for him and unsure if she can continue.  Medications adjusted to assist with agitation, see treatment plan.  Does not meet criteria for geriatric psych at [facility]. ? ?Past Psychiatric History: dementia ? ?Risk to Self:  none ?Risk to Others:  at times ?Prior Inpatient Therapy:  none ?Prior Outpatient Therapy:  none ? ?Past Medical History:  ?Past Medical History:  ?Diagnosis Date  ? AAA (abdominal aortic aneurysm)   ? Dementia (HCC) 05/21/2020  ? Gout, arthropathy   ? Hyperlipidemia   ? Hypertension   ?  ?Past Surgical History:  ?Procedure Laterality Date  ? PERIPHERAL VASCULAR CATHETERIZATION N/A 02/08/2016  ? Procedure: Endovascular Repair/Stent Graft;  Surgeon: Annice NeedyJason S Dew, MD;  Location: [facility] INVASIVE CV LAB;  Service: Cardiovascular;  Laterality: N/A;  ? ?Family History:  ?Family History  ?Problem Relation Age of Onset  ? Cancer Mother   ? Diabetes Mother   ? Heart disease Father   ? Hypertension Father   ? Heart disease Brother   ? ?Family  Psychiatric  History: none ?Social History:  ?Social History  ? ?Substance and Sexual Activity  ?Alcohol Use Yes  ?   ?Social History  ? ?Substance and Sexual Activity  ?Drug Use No  ?  ?Social History  ? ?Socioeconomic History  ? Marital status: Married  ?  Spouse name: Not on file  ? Number of children: Not on file  ? Years of education: Not on file  ? Highest education level: Not on file  ?Occupational History  ? Not on file  ?Tobacco Use  ? Smoking status: Former  ? Smokeless tobacco: Never  ?Substance and Sexual Activity  ? Alcohol use: Yes  ? Drug use: No  ? Sexual activity: Not on file  ?Other Topics Concern  ? Not on file  ?Social History Narrative  ? Not on file  ? ?Social Determinants of Health  ? ?Financial Resource Strain: Low Risk   ? Difficulty of Paying Living Expenses: Not hard at all  ?Food Insecurity: No Food Insecurity  ? Worried About Programme researcher, broadcasting/film/videounning Out of Food in the Last Year: Never true  ? Ran Out of Food in the Last Year: Never true  ?Transportation Needs: No Transportation Needs  ? Lack of Transportation (Medical): No  ? Lack of Transportation (Non-Medical): No  ?Physical Activity: Insufficiently Active  ? Days of Exercise per Week: 4 days  ? Minutes of Exercise per Session: 30 min  ?Stress: No Stress Concern Present  ? Feeling of Stress : Not at all  ?Social Connections: Moderately Integrated  ? Frequency  of Communication with Friends and Family: More than three times a week  ? Frequency of Social Gatherings with Friends and Family: More than three times a week  ? Attends Religious Services: 1 to 4 times per year  ? Active Member of Clubs or Organizations: No  ? Attends Banker Meetings: Never  ? Marital Status: Married  ? ?Additional Social History: ?  ? ?Allergies:   ?Allergies  ?Allergen Reactions  ? Lotensin [Benazepril Hcl] Swelling  ? ? ?Labs: No results found for this or any previous visit (from the past 48 hour(s)). ? ?Current Facility-Administered Medications  ?Medication  Dose Route Frequency Provider Last Rate Last Admin  ? acetaminophen (TYLENOL) tablet 650 mg  650 mg Oral Q4H PRN Andris Baumann, MD      ? Or  ? acetaminophen (TYLENOL) 160 MG/5ML solution 650 mg  650 mg Per Tube Q4H PRN Andris Baumann, MD      ? Or  ? acetaminophen (TYLENOL) suppository 650 mg  650 mg Rectal Q4H PRN Andris Baumann, MD      ? amLODipine (NORVASC) tablet 10 mg  10 mg Oral Daily Lurene Shadow, MD   10 mg at 12/31/21 1022  ? aspirin EC tablet 81 mg  81 mg Oral Daily Lindajo Royal V, MD   81 mg at 12/31/21 1022  ? atorvastatin (LIPITOR) tablet 20 mg  20 mg Oral Daily Andris Baumann, MD   20 mg at 12/31/21 1022  ? enoxaparin (LOVENOX) injection 40 mg  40 mg Subcutaneous Q24H Lindajo Royal V, MD   40 mg at 12/31/21 1018  ? haloperidol lactate (HALDOL) injection 5 mg  5 mg Intramuscular Q6H PRN Lurene Shadow, MD   5 mg at 12/31/21 1019  ? mirtazapine (REMERON) tablet 15 mg  15 mg Oral QHS Andris Baumann, MD   15 mg at 12/30/21 2109  ? QUEtiapine (SEROQUEL) tablet 75 mg  75 mg Oral BID Lurene Shadow, MD   75 mg at 12/31/21 1202  ? senna-docusate (Senokot-S) tablet 1 tablet  1 tablet Oral QHS PRN Andris Baumann, MD      ? ? ?Musculoskeletal: ?Strength & Muscle Tone: within normal limits ?Gait & Station: normal ?Patient leans: N/A ? ?Psychiatric Specialty Exam: ?Physical Exam ?Vitals and nursing note reviewed.  ?Constitutional:   ?   Appearance: Normal appearance.  ?HENT:  ?   Head: Normocephalic.  ?   Nose: Nose normal.  ?Pulmonary:  ?   Effort: Pulmonary effort is normal.  ?Musculoskeletal:  ?   Cervical back: Normal range of motion.  ?Neurological:  ?   Mental Status: He is alert.  ?Psychiatric:     ?   Attention and Perception: Attention and perception normal.     ?   Mood and Affect: Mood is anxious.     ?   Speech: Speech normal.     ?   Behavior: Behavior normal. Behavior is cooperative.     ?   Thought Content: Thought content normal.     ?   Cognition and Memory: Memory is impaired.     ?    Judgment: Judgment is impulsive.  ?  ?Review of Systems  ?Psychiatric/Behavioral:  Positive for memory loss. The patient is nervous/anxious.   ?All other systems reviewed and are negative.  ?Blood pressure (!) 157/93, pulse (!) 59, temperature 97.6 ?F (36.4 ?C), temperature source Oral, resp. rate 20, height 6\' 2"  (1.88 m), weight 88 kg, SpO2 100 %.  Body mass index is 24.91 kg/m?.  ?General Appearance: Casual  ?Eye Contact:  Good  ?Speech:  Normal Rate  ?Volume:  Normal  ?Mood:  Anxious  ?Affect:  Congruent  ?Thought Process:  Coherent and Descriptions of Associations: Intact  ?Orientation:  Other:  person  ?Thought Content:  Logical  ?Suicidal Thoughts:  No  ?Homicidal Thoughts:  No  ?Memory:  Immediate;   Poor ?Recent;   Fair ?Remote;   Fair  ?Judgement:  Impaired  ?Insight:  Lacking  ?Psychomotor Activity:  Normal  ?Concentration:  Concentration: Fair and Attention Span: Fair  ?Recall:  Fair  ?Fund of Knowledge:  Fair  ?Language:  Good  ?Akathisia:  No  ?Handed:  Right  ?AIMS (if indicated):     ?Assets:  Leisure Time ?Resilience ?Social Support  ?ADL's:  Impaired  ?Cognition:  Impaired,  Moderate  ?Sleep:     ? cl ? ?Physical Exam: ?Physical Exam ?Vitals and nursing note reviewed.  ?Constitutional:   ?   Appearance: Normal appearance.  ?HENT:  ?   Head: Normocephalic.  ?   Nose: Nose normal.  ?Pulmonary:  ?   Effort: Pulmonary effort is normal.  ?Musculoskeletal:  ?   Cervical back: Normal range of motion.  ?Neurological:  ?   Mental Status: He is alert.  ?Psychiatric:     ?   Attention and Perception: Attention and perception normal.     ?   Mood and Affect: Mood is anxious.     ?   Speech: Speech normal.     ?   Behavior: Behavior normal. Behavior is cooperative.     ?   Thought Content: Thought content normal.     ?   Cognition and Memory: Memory is impaired.     ?   Judgment: Judgment is impulsive.  ? ?Review of Systems  ?Psychiatric/Behavioral:  Positive for memory loss. The patient is nervous/anxious.    ?All other systems reviewed and are negative. ?Blood pressure (!) 157/93, pulse (!) 59, temperature 97.6 ?F (36.4 ?C), temperature source Oral, resp. rate 20, height 6\' 2"  (1.88 m), weight 88 kg, SpO2 100 %. Body ma

## 2021-12-31 NOTE — TOC Progression Note (Signed)
Transition of Care (TOC) - Progression Note  ? ? ?Patient Details  ?Name: Larry [place] ?MRN: [id number] ?Date of Birth: 01-26-1941 ? ?Transition of Care (TOC) CM/SW Contact  ?Bing Quarry, RN ?Phone Number: ?12/31/2021, 9:54 AM ? ?Clinical Narrative: 12/31/21 Discharge planning follow up. Patient admitted  12/28/21 with abnormal behavior that fell outside his normal behaviors with dementia per daughter. NO PCP on record. Provider and CM (per notes) have discussed post discharge planning with hopes of memory care or rehab. Daughter currently seeking information regarding memory care placement or other resources this weekend as she indicates caring for him at home has reached a point that she cannot care for him safely and is not at home due to working. Unsure of her ability to manage patient to be able to get to outpatient OT.  ?Per provider ED H&P CT showed possible acute right occipital lobe ischemia compared to study done on 05/2020.  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  ?Current symptom onset was over several days so code stroke was not initiated. Marland Kitchen  ?PT did not make any post acute recommendations for STR or HH as patient was able to ambulate 500 feet.  ?OT evaluation of 3/17 recommended outpatient due to poor safety awareness, occasional low grade stagger stepping with ambulation, poor orientation. Patient oriented only to self at time of OT evaluation.  ? ?NOTE: Currently with 1:1 safety sitter and sedation overnight.  ? ?Per hospitalist NP notes of last night: new PIV order for IV ativan administration at 0415.  ?Patient required multiple doses of Ativan at 0020 and 0420, and one dose of Haldol at 3 pm 3/17 and again this am 12/31/21 at 1011 am per orders.   ?1:1 safety sitter reordered, at 2116 pm 12/30/21, with notation of extreme aggression score of 3 in NP hospitalist assessment for need for sitter.  ?RN also notes of difficulty in redirecting patient. ?No PCP on record.  ?LEA,LISA  (Daughter)  ?[phone] ([place]) ?[id number] [name], [place] - [address] ? ?Plan: RN CM will discuss post acute discharge options with daughter again today pending current situation of agitation, aggression, safety sitter ongoing issues in the acute setting.  ?Gabriel Cirri RN CM  ? ?  ?  ? ?Expected Discharge Plan and Services ?  ?  ?  ?  ?  ?                ?  ?  ?  ?  ?  ?  ?  ?  ?  ?  ? ? ?Social Determinants of Health (SDOH) Interventions ?  ? ?Readmission Risk Interventions ?No flowsheet data found. ? ?

## 2021-12-31 NOTE — Progress Notes (Addendum)
? ? ? ?Progress Note  ? ? ?Larry [place]  [id number] DOB: [birth year]  DOA: 12/28/2021 ?PCP: Pcp, No  ? ? ? ? ?Brief Narrative:  ? ? ?Medical records reviewed and are as summarized below: ? ?Larry [place] is a 81 y.o. male with medical history significant for hypertension, hyperlipidemia, gout, stroke (old infarcts on MRI) dementia, AAA repair, who was brought to the hospital because of worsening abnormal behavior for about 1 week.  Reportedly, he crawled out of his bathroom window and was found by his neighbors. ? ?CT head was concerning for acute stroke.  However MRI brain did not show any evidence of acute stroke ? ? ? ? ? ? ?Assessment/Plan:  ? ?Principal Problem: ?  Altered mental status ?Active Problems: ?  Abnormal CT of the head, possible acute ischemia ?  Dementia with behavioral disturbance ?  Hyperlipidemia, mixed ?  Hypertension ?  Gout, arthropathy ?  History of abdominal aortic aneurysm (AAA) repair ? ? ?Body mass index is 24.91 kg/m?. ? ? ?Dementia with behavioral disturbance: No evidence of acute stroke on MRI brain.  Continue Remeron and Seroquel.  Increased dose of Seroquel from 100 mg nightly to 75 mg twice daily.  Use Haldol IM as needed for agitation.  Continue one-to-one sitter for safety.  Follow-up with case manager to assist with disposition. Consulted psychiatrist, Dr. He, to assist with management ? ?Hypertension: Continue amlodipine ? ?Other comorbidities include hyperlipidemia, gout, AAA repair ? ? ?Diet Order   ? ?       ?  Diet Heart Room service appropriate? Yes; Fluid consistency: Thin  Diet effective now       ?  ? ?  ?  ? ?  ? ? ? ? ? ? ? ? ? ?Consultants: ?None ? ?Procedures: ?None ? ? ? ?Medications:  ? ? amLODipine  10 mg Oral Daily  ? aspirin EC  81 mg Oral Daily  ? atorvastatin  20 mg Oral Daily  ? enoxaparin (LOVENOX) injection  40 mg Subcutaneous Q24H  ? mirtazapine  15 mg Oral QHS  ? QUEtiapine  75 mg Oral BID  ? ?Continuous Infusions: ? ? ?Anti-infectives  (From admission, onward)  ? ? None  ? ?  ? ? ? ? ? ? ? ? ? ?Family Communication/Anticipated D/C date and plan/Code Status  ? ?DVT prophylaxis: enoxaparin (LOVENOX) injection 40 mg Start: 12/29/21 0800 ? ? ?  Code Status: Full Code ? ?Family Communication: None ?Disposition Plan: Plan to discharge to memory care unit ? ? ?Status is: Observation ?The patient will require care spanning > 2 midnights and should be moved to inpatient because: Unsafe discharge plan ? ? ? ? ? ? ?Subjective:  ? ?Interval events noted.  Patient has been very agitated requiring sedatives and one-to-one sitter for safety.  There was a sitter at the bedside ? ? ?Objective:  ? ? ?Vitals:  ? 12/30/21 0804 12/30/21 1600 12/30/21 2156 12/31/21 1021  ?BP: (!) 154/74 (!) 161/87 (!) 120/53 (!) 157/93  ?Pulse: (!) 54 61 72 (!) 59  ?Resp: 18 17 16 20   ?Temp: 97.8 ?F (36.6 ?C) 98.1 ?F (36.7 ?C) 98.5 ?F (36.9 ?C) 97.6 ?F (36.4 ?C)  ?TempSrc:      ?SpO2: 100% 99% 97% 100%  ?Weight:      ?Height:      ? ?No data found. ? ? ?Intake/Output Summary (Last 24 hours) at 12/31/2021 1050 ?Last data filed at 12/30/2021 1422 ?Gross per 24 hour  ?  Intake 240 ml  ?Output --  ?Net 240 ml  ? ?Filed Weights  ? 12/28/21 2206 12/29/21 0258  ?Weight: 90 kg 88 kg  ? ? ?Exam: ? ?GEN: NAD ?SKIN: No rash ?EYES: EOMI ?ENT: MMM ?CV: RRR ?PULM: CTA B ?ABD: soft, ND, NT, +BS ?CNS: Alert but confused ?EXT: No edema or tenderness ?PSYCH: Poor insight and judgment ? ? ? ?  ? ? ?Data Reviewed:  ? ?I have personally reviewed following labs and imaging studies: ? ?Labs: ?Labs show the following:  ? ?Basic Metabolic Panel: ?Recent Labs  ?Lab 12/28/21 ?2210 12/29/21 ?0041  ?NA 140 140  ?K 4.1 4.0  ?CL 104 105  ?CO2 28 29  ?GLUCOSE 126* 123*  ?BUN 16 14  ?CREATININE 0.99 0.85  ?CALCIUM 8.9 8.8*  ? ?GFR ?Estimated Creatinine Clearance: 79.2 mL/min (by C-G formula based on SCr of 0.85 mg/dL). ?Liver Function Tests: ?Recent Labs  ?Lab 12/28/21 ?2210  ?AST 18  ?ALT 14  ?ALKPHOS 60  ?BILITOT 0.3   ?PROT 7.7  ?ALBUMIN 3.5  ? ?No results for input(s): LIPASE, AMYLASE in the last 168 hours. ?No results for input(s): AMMONIA in the last 168 hours. ?Coagulation profile ?Recent Labs  ?Lab 12/29/21 ?0041  ?INR 1.0  ? ? ?CBC: ?Recent Labs  ?Lab 12/28/21 ?2210  ?WBC 5.3  ?NEUTROABS 2.9  ?HGB 13.3  ?HCT 41.8  ?MCV 94.4  ?PLT 308  ? ?Cardiac Enzymes: ?No results for input(s): CKTOTAL, CKMB, CKMBINDEX, TROPONINI in the last 168 hours. ?BNP (last 3 results) ?No results for input(s): PROBNP in the last 8760 hours. ?CBG: ?Recent Labs  ?Lab 12/29/21 ?0248  ?GLUCAP 133*  ? ?D-Dimer: ?No results for input(s): DDIMER in the last 72 hours. ?Hgb A1c: ?Recent Labs  ?  12/29/21 ?0054  ?HGBA1C 6.3*  ? ?Lipid Profile: ?Recent Labs  ?  12/29/21 ?0710  ?CHOL 131  ?HDL 49  ?Magee 73  ?TRIG 44  ?CHOLHDL 2.7  ? ?Thyroid function studies: ?No results for input(s): TSH, T4TOTAL, T3FREE, THYROIDAB in the last 72 hours. ? ?Invalid input(s): FREET3 ?Anemia work up: ?No results for input(s): VITAMINB12, FOLATE, FERRITIN, TIBC, IRON, RETICCTPCT in the last 72 hours. ?Sepsis Labs: ?Recent Labs  ?Lab 12/28/21 ?2210  ?WBC 5.3  ? ? ?Microbiology ?Recent Results (from the past 240 hour(s))  ?Resp Panel by RT-PCR (Flu A&B, Covid) Nasopharyngeal Swab     Status: None  ? Collection Time: 12/29/21 12:41 AM  ? Specimen: Nasopharyngeal Swab; Nasopharyngeal(NP) swabs in vial transport medium  ?Result Value Ref Range Status  ? SARS Coronavirus 2 by RT PCR NEGATIVE NEGATIVE Final  ?  Comment: (NOTE) ?SARS-CoV-2 target nucleic acids are NOT DETECTED. ? ?The SARS-CoV-2 RNA is generally detectable in upper respiratory ?specimens during the acute phase of infection. The lowest ?concentration of SARS-CoV-2 viral copies this assay can detect is ?138 copies/mL. A negative result does not preclude SARS-Cov-2 ?infection and should not be used as the sole basis for treatment or ?other patient management decisions. A negative result may occur with  ?improper specimen  collection/handling, submission of specimen other ?than nasopharyngeal swab, presence of viral mutation(s) within the ?areas targeted by this assay, and inadequate number of viral ?copies(<138 copies/mL). A negative result must be combined with ?clinical observations, patient history, and epidemiological ?information. The expected result is Negative. ? ?Fact Sheet for Patients:  ?EntrepreneurPulse.com.au ? ?Fact Sheet for Healthcare Providers:  ?IncredibleEmployment.be ? ?This test is no t yet approved or cleared by the [place] and  ?  has been authorized for detection and/or diagnosis of SARS-CoV-2 by ?FDA under an Emergency Use Authorization (EUA). This EUA will remain  ?in effect (meaning this test can be used) for the duration of the ?COVID-19 declaration under Section 564(b)(1) of the Act, 21 ?U.S.C.section 360bbb-3(b)(1), unless the authorization is terminated  ?or revoked sooner.  ? ? ?  ? Influenza A by PCR NEGATIVE NEGATIVE Final  ? Influenza B by PCR NEGATIVE NEGATIVE Final  ?  Comment: (NOTE) ?The Xpert Xpress SARS-CoV-2/FLU/RSV plus assay is intended as an aid ?in the diagnosis of influenza from Nasopharyngeal swab specimens and ?should not be used as a sole basis for treatment. Nasal washings and ?aspirates are unacceptable for Xpert Xpress SARS-CoV-2/FLU/RSV ?testing. ? ?Fact Sheet for Patients: ?EntrepreneurPulse.com.au ? ?Fact Sheet for Healthcare Providers: ?IncredibleEmployment.be ? ?This test is not yet approved or cleared by the [place] FDA and ?has been authorized for detection and/or diagnosis of SARS-CoV-2 by ?FDA under an Emergency Use Authorization (EUA). This EUA will remain ?in effect (meaning this test can be used) for the duration of the ?COVID-19 declaration under Section 564(b)(1) of the Act, 21 U.S.C. ?section 360bbb-3(b)(1), unless the authorization is terminated or ?revoked. ? ?Performed at [facility], [place], ?[place] [address] ?  ? ? ?Procedures and diagnostic studies: ? ?ECHOCARDIOGRAM COMPLETE ? ?Result Date: 12/30/2021 ?   ECHOCARDIOGRAM REPORT   Patient Name:   Lyman Bishop

## 2022-01-01 DIAGNOSIS — R7303 Prediabetes: Secondary | ICD-10-CM | POA: Diagnosis present

## 2022-01-01 DIAGNOSIS — Z20822 Contact with and (suspected) exposure to covid-19: Secondary | ICD-10-CM | POA: Diagnosis present

## 2022-01-01 DIAGNOSIS — Z833 Family history of diabetes mellitus: Secondary | ICD-10-CM | POA: Diagnosis not present

## 2022-01-01 DIAGNOSIS — Z888 Allergy status to other drugs, medicaments and biological substances status: Secondary | ICD-10-CM | POA: Diagnosis not present

## 2022-01-01 DIAGNOSIS — F05 Delirium due to known physiological condition: Secondary | ICD-10-CM | POA: Diagnosis present

## 2022-01-01 DIAGNOSIS — R509 Fever, unspecified: Secondary | ICD-10-CM | POA: Diagnosis not present

## 2022-01-01 DIAGNOSIS — R410 Disorientation, unspecified: Secondary | ICD-10-CM | POA: Diagnosis not present

## 2022-01-01 DIAGNOSIS — G459 Transient cerebral ischemic attack, unspecified: Secondary | ICD-10-CM | POA: Diagnosis present

## 2022-01-01 DIAGNOSIS — Z5320 Procedure and treatment not carried out because of patient's decision for unspecified reasons: Secondary | ICD-10-CM | POA: Diagnosis not present

## 2022-01-01 DIAGNOSIS — Z5986 Financial insecurity: Secondary | ICD-10-CM | POA: Diagnosis not present

## 2022-01-01 DIAGNOSIS — Z8249 Family history of ischemic heart disease and other diseases of the circulatory system: Secondary | ICD-10-CM | POA: Diagnosis not present

## 2022-01-01 DIAGNOSIS — I10 Essential (primary) hypertension: Secondary | ICD-10-CM | POA: Diagnosis present

## 2022-01-01 DIAGNOSIS — G309 Alzheimer's disease, unspecified: Secondary | ICD-10-CM | POA: Diagnosis present

## 2022-01-01 DIAGNOSIS — Z9889 Other specified postprocedural states: Secondary | ICD-10-CM | POA: Diagnosis not present

## 2022-01-01 DIAGNOSIS — Z8673 Personal history of transient ischemic attack (TIA), and cerebral infarction without residual deficits: Secondary | ICD-10-CM | POA: Diagnosis not present

## 2022-01-01 DIAGNOSIS — F03918 Unspecified dementia, unspecified severity, with other behavioral disturbance: Secondary | ICD-10-CM | POA: Diagnosis not present

## 2022-01-01 DIAGNOSIS — F02811 Dementia in other diseases classified elsewhere, unspecified severity, with agitation: Secondary | ICD-10-CM | POA: Diagnosis present

## 2022-01-01 DIAGNOSIS — Z7982 Long term (current) use of aspirin: Secondary | ICD-10-CM | POA: Diagnosis not present

## 2022-01-01 DIAGNOSIS — R4182 Altered mental status, unspecified: Secondary | ICD-10-CM | POA: Diagnosis not present

## 2022-01-01 DIAGNOSIS — Z87891 Personal history of nicotine dependence: Secondary | ICD-10-CM | POA: Diagnosis not present

## 2022-01-01 DIAGNOSIS — E782 Mixed hyperlipidemia: Secondary | ICD-10-CM | POA: Diagnosis present

## 2022-01-01 DIAGNOSIS — Z79899 Other long term (current) drug therapy: Secondary | ICD-10-CM | POA: Diagnosis not present

## 2022-01-01 NOTE — Plan of Care (Signed)
  Problem: Education: Goal: Knowledge of General Education information will improve Description: Including pain rating scale, medication(s)/side effects and non-pharmacologic comfort measures Outcome: Progressing   Problem: Health Behavior/Discharge Planning: Goal: Ability to manage health-related needs will improve Outcome: Progressing   Problem: Clinical Measurements: Goal: Ability to maintain clinical measurements within normal limits will improve Outcome: Progressing Goal: Will remain free from infection Outcome: Progressing Goal: Diagnostic test results will improve Outcome: Progressing Goal: Respiratory complications will improve Outcome: Progressing Goal: Cardiovascular complication will be avoided Outcome: Progressing   Problem: Activity: Goal: Risk for activity intolerance will decrease Outcome: Progressing   Problem: Nutrition: Goal: Adequate nutrition will be maintained Outcome: Progressing   Problem: Coping: Goal: Level of anxiety will decrease Outcome: Progressing   Problem: Elimination: Goal: Will not experience complications related to bowel motility Outcome: Progressing Goal: Will not experience complications related to urinary retention Outcome: Progressing   Problem: Pain Managment: Goal: General experience of comfort will improve Outcome: Progressing   Problem: Safety: Goal: Ability to remain free from injury will improve Outcome: Progressing   Problem: Skin Integrity: Goal: Risk for impaired skin integrity will decrease Outcome: Progressing   Problem: Education: Goal: Knowledge of disease or condition will improve Outcome: Progressing Goal: Knowledge of secondary prevention will improve (SELECT ALL) Outcome: Progressing   

## 2022-01-01 NOTE — Progress Notes (Addendum)
? ? ? ?Progress Note  ? ? ?Larry Buckley  [id number] DOB: 31-Dec-1940  DOA: 12/28/2021 ?PCP: Pcp, No  ? ? ? ? ?Brief Narrative:  ? ? ?Medical records reviewed and are as summarized below: ? ?Larry Buckley is a 81 y.o. male with medical history significant for hypertension, hyperlipidemia, gout, stroke (old infarcts on MRI) dementia, AAA repair, who was brought to the hospital because of worsening abnormal behavior for about 1 week.  Reportedly, he crawled out of his bathroom window and was found by his neighbors. ? ?CT head was concerning for acute stroke.  However MRI brain did not show any evidence of acute stroke ? ? ? ? ? ? ?Assessment/Plan:  ? ?Principal Problem: ?  Altered mental status ?Active Problems: ?  Abnormal CT of the head, possible acute ischemia ?  Dementia with behavioral disturbance ?  Hyperlipidemia, mixed ?  Hypertension ?  Gout, arthropathy ?  History of abdominal aortic aneurysm (AAA) repair ? ? ?Body mass index is 24.91 kg/m?. ? ? ?Dementia with behavioral disturbance: No evidence of acute stroke on MRI brain.  Risperidone has been substituted for Seroquel by psychiatrist.  Continue Remeron.  Use IM Haldol as needed for agitation.  Appreciate input from psychiatrist.  Follow-up with case manager to assist with placement to memory care unit ? ?Hypertension: BP is optimal.  Continue amlodipine ? ?Other comorbidities include hyperlipidemia, gout, AAA repair ? ? ?Diet Order   ? ?       ?  Diet Heart Room service appropriate? Yes; Fluid consistency: Thin  Diet effective now       ?  ? ?  ?  ? ?  ? ? ? ? ? ? ? ? ? ?Consultants: ?Psychiatrist ? ?Procedures: ?None ? ? ? ?Medications:  ? ? amLODipine  10 mg Oral Daily  ? aspirin EC  81 mg Oral Daily  ? atorvastatin  20 mg Oral Daily  ? divalproex  125 mg Oral Q12H  ? enoxaparin (LOVENOX) injection  40 mg Subcutaneous Q24H  ? mirtazapine  15 mg Oral QHS  ? risperiDONE  0.5 mg Oral BID  ? ?Continuous Infusions: ? ? ?Anti-infectives (From  admission, onward)  ? ? None  ? ?  ? ? ? ? ? ? ? ? ? ?Family Communication/Anticipated D/C date and plan/Code Status  ? ?DVT prophylaxis: enoxaparin (LOVENOX) injection 40 mg Start: 12/29/21 0800 ? ? ?  Code Status: Full Code ? ?Family Communication: None ?Disposition Plan: Plan to discharge to memory care unit ? ? ? ? ? ? ? ?Subjective:  ? ?Interval events noted.  He has no complaints.  He has a Actuary at the bedside.  His nurse was also at the bedside during this encounter. ? ? ?Objective:  ? ? ?Vitals:  ? 12/30/21 2156 12/31/21 1021 01/01/22 0500 01/01/22 0745  ?BP: (!) 120/53 (!) 157/93 137/72 134/64  ?Pulse: 72 (!) 59 (!) 58 (!) 58  ?Resp: 16 20 18 19   ?Temp: 98.5 ?F (36.9 ?C) 97.6 ?F (36.4 ?C) (!) 97.4 ?F (36.3 ?C) (!) 97.4 ?F (36.3 ?C)  ?TempSrc:  Oral Axillary Axillary  ?SpO2: 97% 100% 100% 100%  ?Weight:      ?Height:      ? ?No data found. ? ? ?Intake/Output Summary (Last 24 hours) at 01/01/2022 1121 ?Last data filed at 12/31/2021 2100 ?Gross per 24 hour  ?Intake 240 ml  ?Output 500 ml  ?Net -260 ml  ? ?Filed Weights  ? 12/28/21  2206 12/29/21 0258  ?Weight: 90 kg 88 kg  ? ? ?Exam: ? ?GEN: NAD ?SKIN: No rash ?EYES: EOMI ?ENT: MMM ?CV: RRR ?PULM: CTA B ?ABD: soft, ND, NT, +BS ?CNS: AAO x 1 (person), non focal ?EXT: No edema or tenderness ?PSYCH: Poor insight and judgment ? ? ? ?  ? ? ?Data Reviewed:  ? ?I have personally reviewed following labs and imaging studies: ? ?Labs: ?Labs show the following:  ? ?Basic Metabolic Panel: ?Recent Labs  ?Lab 12/28/21 ?2210 12/29/21 ?0041  ?NA 140 140  ?K 4.1 4.0  ?CL 104 105  ?CO2 28 29  ?GLUCOSE 126* 123*  ?BUN 16 14  ?CREATININE 0.99 0.85  ?CALCIUM 8.9 8.8*  ? ?GFR ?Estimated Creatinine Clearance: 79.2 mL/min (by C-G formula based on SCr of 0.85 mg/dL). ?Liver Function Tests: ?Recent Labs  ?Lab 12/28/21 ?2210  ?AST 18  ?ALT 14  ?ALKPHOS 60  ?BILITOT 0.3  ?PROT 7.7  ?ALBUMIN 3.5  ? ?No results for input(s): LIPASE, AMYLASE in the last 168 hours. ?No results for input(s):  AMMONIA in the last 168 hours. ?Coagulation profile ?Recent Labs  ?Lab 12/29/21 ?0041  ?INR 1.0  ? ? ?CBC: ?Recent Labs  ?Lab 12/28/21 ?2210  ?WBC 5.3  ?NEUTROABS 2.9  ?HGB 13.3  ?HCT 41.8  ?MCV 94.4  ?PLT 308  ? ?Cardiac Enzymes: ?No results for input(s): CKTOTAL, CKMB, CKMBINDEX, TROPONINI in the last 168 hours. ?BNP (last 3 results) ?No results for input(s): PROBNP in the last 8760 hours. ?CBG: ?Recent Labs  ?Lab 12/29/21 ?0248  ?GLUCAP 133*  ? ?D-Dimer: ?No results for input(s): DDIMER in the last 72 hours. ?Hgb A1c: ?No results for input(s): HGBA1C in the last 72 hours. ? ?Lipid Profile: ?No results for input(s): CHOL, HDL, LDLCALC, TRIG, CHOLHDL, LDLDIRECT in the last 72 hours. ? ?Thyroid function studies: ?No results for input(s): TSH, T4TOTAL, T3FREE, THYROIDAB in the last 72 hours. ? ?Invalid input(s): FREET3 ?Anemia work up: ?No results for input(s): VITAMINB12, FOLATE, FERRITIN, TIBC, IRON, RETICCTPCT in the last 72 hours. ?Sepsis Labs: ?Recent Labs  ?Lab 12/28/21 ?2210  ?WBC 5.3  ? ? ?Microbiology ?Recent Results (from the past 240 hour(s))  ?Resp Panel by RT-PCR (Flu A&B, Covid) Nasopharyngeal Swab     Status: None  ? Collection Time: 12/29/21 12:41 AM  ? Specimen: Nasopharyngeal Swab; Nasopharyngeal(NP) swabs in vial transport medium  ?Result Value Ref Range Status  ? SARS Coronavirus 2 by RT PCR NEGATIVE NEGATIVE Final  ?  Comment: (NOTE) ?SARS-CoV-2 target nucleic acids are NOT DETECTED. ? ?The SARS-CoV-2 RNA is generally detectable in upper respiratory ?specimens during the acute phase of infection. The lowest ?concentration of SARS-CoV-2 viral copies this assay can detect is ?138 copies/mL. A negative result does not preclude SARS-Cov-2 ?infection and should not be used as the sole basis for treatment or ?other patient management decisions. A negative result may occur with  ?improper specimen collection/handling, submission of specimen other ?than nasopharyngeal swab, presence of viral mutation(s)  within the ?areas targeted by this assay, and inadequate number of viral ?copies(<138 copies/mL). A negative result must be combined with ?clinical observations, patient history, and epidemiological ?information. The expected result is Negative. ? ?Fact Sheet for Patients:  ?EntrepreneurPulse.com.au ? ?Fact Sheet for Healthcare Providers:  ?IncredibleEmployment.be ? ?This test is no t yet approved or cleared by the [place] FDA and  ?has been authorized for detection and/or diagnosis of SARS-CoV-2 by ?FDA under an Emergency Use Authorization (EUA). This EUA will remain  ?  in effect (meaning this test can be used) for the duration of the ?COVID-19 declaration under Section 564(b)(1) of the Act, 21 ?U.S.C.section 360bbb-3(b)(1), unless the authorization is terminated  ?or revoked sooner.  ? ? ?  ? Influenza A by PCR NEGATIVE NEGATIVE Final  ? Influenza B by PCR NEGATIVE NEGATIVE Final  ?  Comment: (NOTE) ?The Xpert Xpress SARS-CoV-2/FLU/RSV plus assay is intended as an aid ?in the diagnosis of influenza from Nasopharyngeal swab specimens and ?should not be used as a sole basis for treatment. Nasal washings and ?aspirates are unacceptable for Xpert Xpress SARS-CoV-2/FLU/RSV ?testing. ? ?Fact Sheet for Patients: ?EntrepreneurPulse.com.au ? ?Fact Sheet for Healthcare Providers: ?IncredibleEmployment.be ? ?This test is not yet approved or cleared by the [place] FDA and ?has been authorized for detection and/or diagnosis of SARS-CoV-2 by ?FDA under an Emergency Use Authorization (EUA). This EUA will remain ?in effect (meaning this test can be used) for the duration of the ?COVID-19 declaration under Section 564(b)(1) of the Act, 21 U.S.C. ?section 360bbb-3(b)(1), unless the authorization is terminated or ?revoked. ? ?Performed at [facility], [place], ?[place] [address] ?  ? ? ?Procedures and diagnostic studies: ? ?No  results found. ? ? ? ? ? ? ? ? ? ? ? ? LOS: 0 days  ? ?Tonie Vizcarrondo  ?[facility]  ? ?Pager on [email or web address]. If 7PM-7AM, please contact night-coverage at [email or web address] ? ? ? ? ?01/01/2022, 11:21

## 2022-01-02 ENCOUNTER — Ambulatory Visit: Payer: Medicare PPO | Admitting: Licensed Clinical Social Worker

## 2022-01-02 DIAGNOSIS — F03918 Unspecified dementia, unspecified severity, with other behavioral disturbance: Secondary | ICD-10-CM | POA: Diagnosis not present

## 2022-01-02 DIAGNOSIS — I10 Essential (primary) hypertension: Secondary | ICD-10-CM

## 2022-01-02 DIAGNOSIS — R410 Disorientation, unspecified: Secondary | ICD-10-CM

## 2022-01-02 MED ORDER — MELATONIN 5 MG PO TABS: 2.5000 mg | ORAL_TABLET | Freq: Once | ORAL | Status: DC

## 2022-01-02 NOTE — Patient Instructions (Signed)
Visit Information ? ?Thank you for taking time to visit with me today. Please don't hesitate to contact me if I can be of assistance to you before our next scheduled telephone appointment. ? ?Following are the goals we discussed today:  ?Patient Goals/Self-Care Activities: Over the next 120 days ?Continue with compliance of taking medication  ?Increase healthy habits and continue utilizing coping skills ?Contact PCP office with any questions or concerns ?Attend all scheduled appts with providers ? ?Our next appointment is by telephone on 01/09/22 at 11:45 AM ? ?Please call the care guide team at [phone] if you need to cancel or reschedule your appointment.  ? ?If you are experiencing a Mental Health or [place] or need someone to talk to, please call 911  ? ?The patient verbalized understanding of instructions, educational materials, and care plan provided today and declined offer to receive copy of patient instructions, educational materials, and care plan.  ? ?Christa See, MSW, LCSW ?[facility] ?[facility] ?[email or web address] ?Phone [phone] ?5:56 PM ? ?

## 2022-01-02 NOTE — TOC Progression Note (Signed)
Transition of Care (TOC) - Progression Note  ? ? ?Patient Details  ?Name: Larry Buckley ?MRN: [id number] ?Date of Birth: [birth year] ? ?Transition of Care (TOC) CM/SW Contact  ?Caryn Section, RN ?Phone Number: ?01/02/2022, 3:35 PM ? ?Clinical Narrative:   Daughter Larry Buckley continues to call Memory Care facilities but states that she only wants [facility] for patient.  She has financial information and will forward to [place].  She will call RNCM back with final dispositon.   ? ?RNCM provided daughter with [facility] and [address] information ? ? ? ?  ?  ? ?Expected Discharge Plan and Services ?  ?  ?  ?  ?  ?                ?  ?  ?  ?  ?  ?  ?  ?  ?  ?  ? ? ?Social Determinants of Health (SDOH) Interventions ?  ? ?Readmission Risk Interventions ?No flowsheet data found. ? ?

## 2022-01-02 NOTE — Progress Notes (Signed)
? ? ? ?Progress Note  ? ? ?Larry Buckley  [id number] DOB: 29-Jun-1941  DOA: 12/28/2021 ?PCP: Pcp, No  ? ? ? ? ?Brief Narrative:  ? ? ?Medical records reviewed and are as summarized below: ? ?Larry Buckley is a 81 y.o. male with medical history significant for hypertension, hyperlipidemia, gout, stroke (old infarcts on MRI) dementia, AAA repair, who was brought to the hospital because of worsening abnormal behavior for about 1 week.  Reportedly, he crawled out of his bathroom window and was found by his neighbors. ? ?CT head was concerning for acute stroke.  However MRI brain did not show any evidence of acute stroke ? ? ? ? ? ? ?Assessment/Plan:  ? ?Principal Problem: ?  Altered mental status ?Active Problems: ?  Abnormal CT of the head, possible acute ischemia ?  Dementia with behavioral disturbance ?  Hyperlipidemia, mixed ?  Hypertension ?  Gout, arthropathy ?  History of abdominal aortic aneurysm (AAA) repair ? ? ?Body mass index is 24.91 kg/m?. ? ? ?Dementia with behavioral disturbance: No evidence of acute stroke on MRI brain.  Continue risperidone (started on 01/01/2022) and Remeron.  Seroquel has been discontinued by psychiatrist.  Follow-up with case manager to assist with disposition to memory care unit/SNF. ? ?Hypertension: BP is optimal.  Continue amlodipine ? ?Other comorbidities include hyperlipidemia, gout, AAA repair ? ? ?Diet Order   ? ?       ?  Diet Heart Room service appropriate? Yes; Fluid consistency: Thin  Diet effective now       ?  ? ?  ?  ? ?  ? ? ? ? ? ? ? ? ? ?Consultants: ?Psychiatrist ? ?Procedures: ?None ? ? ? ?Medications:  ? ? amLODipine  10 mg Oral Daily  ? aspirin EC  81 mg Oral Daily  ? atorvastatin  20 mg Oral Daily  ? divalproex  125 mg Oral Q12H  ? enoxaparin (LOVENOX) injection  40 mg Subcutaneous Q24H  ? mirtazapine  15 mg Oral QHS  ? risperiDONE  0.5 mg Oral BID  ? ?Continuous Infusions: ? ? ?Anti-infectives (From admission, onward)  ? ? None  ? ?   ? ? ? ? ? ? ? ? ? ?Family Communication/Anticipated D/C date and plan/Code Status  ? ?DVT prophylaxis: enoxaparin (LOVENOX) injection 40 mg Start: 12/29/21 0800 ? ? ?  Code Status: Full Code ? ?Family Communication: None ?Disposition Plan: Plan to discharge to memory care unit/SNF ? ? ? ? ? ? ? ?Subjective:  ? ?Interval events noted.  He is confused and unable to provide any history. ? ? ?Objective:  ? ? ?Vitals:  ? 01/01/22 2026 01/02/22 0720 01/02/22 1111 01/02/22 1537  ?BP: (!) 109/55 137/67 (!) 141/60 112/62  ?Pulse: 62 (!) 54 73 68  ?Resp: 17 16 17 16   ?Temp: 98.2 ?F (36.8 ?C) 98.5 ?F (36.9 ?C) 98.5 ?F (36.9 ?C) 98.3 ?F (36.8 ?C)  ?TempSrc: Oral     ?SpO2: 97% 100% 98% 99%  ?Weight:      ?Height:      ? ?No data found. ? ? ?Intake/Output Summary (Last 24 hours) at 01/02/2022 1653 ?Last data filed at 01/02/2022 1406 ?Gross per 24 hour  ?Intake 680 ml  ?Output --  ?Net 680 ml  ? ?Filed Weights  ? 12/28/21 2206 12/29/21 0258  ?Weight: 90 kg 88 kg  ? ? ?Exam: ? ?GEN: NAD ?SKIN: No rash ?EYES: EOMI ?ENT: MMM ?CV: RRR ?PULM: CTA B ?ABD:  soft, ND, NT, +BS ?CNS: AAO x 1 (person), non focal ?EXT: No edema or tenderness ? ? ? ?  ? ? ?Data Reviewed:  ? ?I have personally reviewed following labs and imaging studies: ? ?Labs: ?Labs show the following:  ? ?Basic Metabolic Panel: ?Recent Labs  ?Lab 12/28/21 ?2210 12/29/21 ?0041  ?NA 140 140  ?K 4.1 4.0  ?CL 104 105  ?CO2 28 29  ?GLUCOSE 126* 123*  ?BUN 16 14  ?CREATININE 0.99 0.85  ?CALCIUM 8.9 8.8*  ? ?GFR ?Estimated Creatinine Clearance: 79.2 mL/min (by C-G formula based on SCr of 0.85 mg/dL). ?Liver Function Tests: ?Recent Labs  ?Lab 12/28/21 ?2210  ?AST 18  ?ALT 14  ?ALKPHOS 60  ?BILITOT 0.3  ?PROT 7.7  ?ALBUMIN 3.5  ? ?No results for input(s): LIPASE, AMYLASE in the last 168 hours. ?No results for input(s): AMMONIA in the last 168 hours. ?Coagulation profile ?Recent Labs  ?Lab 12/29/21 ?0041  ?INR 1.0  ? ? ?CBC: ?Recent Labs  ?Lab 12/28/21 ?2210  ?WBC 5.3  ?NEUTROABS 2.9   ?HGB 13.3  ?HCT 41.8  ?MCV 94.4  ?PLT 308  ? ?Cardiac Enzymes: ?No results for input(s): CKTOTAL, CKMB, CKMBINDEX, TROPONINI in the last 168 hours. ?BNP (last 3 results) ?No results for input(s): PROBNP in the last 8760 hours. ?CBG: ?Recent Labs  ?Lab 12/29/21 ?0248  ?GLUCAP 133*  ? ?D-Dimer: ?No results for input(s): DDIMER in the last 72 hours. ?Hgb A1c: ?No results for input(s): HGBA1C in the last 72 hours. ? ?Lipid Profile: ?No results for input(s): CHOL, HDL, LDLCALC, TRIG, CHOLHDL, LDLDIRECT in the last 72 hours. ? ?Thyroid function studies: ?No results for input(s): TSH, T4TOTAL, T3FREE, THYROIDAB in the last 72 hours. ? ?Invalid input(s): FREET3 ?Anemia work up: ?No results for input(s): VITAMINB12, FOLATE, FERRITIN, TIBC, IRON, RETICCTPCT in the last 72 hours. ?Sepsis Labs: ?Recent Labs  ?Lab 12/28/21 ?2210  ?WBC 5.3  ? ? ?Microbiology ?Recent Results (from the past 240 hour(s))  ?Resp Panel by RT-PCR (Flu A&B, Covid) Nasopharyngeal Swab     Status: None  ? Collection Time: 12/29/21 12:41 AM  ? Specimen: Nasopharyngeal Swab; Nasopharyngeal(NP) swabs in vial transport medium  ?Result Value Ref Range Status  ? SARS Coronavirus 2 by RT PCR NEGATIVE NEGATIVE Final  ?  Comment: (NOTE) ?SARS-CoV-2 target nucleic acids are NOT DETECTED. ? ?The SARS-CoV-2 RNA is generally detectable in upper respiratory ?specimens during the acute phase of infection. The lowest ?concentration of SARS-CoV-2 viral copies this assay can detect is ?138 copies/mL. A negative result does not preclude SARS-Cov-2 ?infection and should not be used as the sole basis for treatment or ?other patient management decisions. A negative result may occur with  ?improper specimen collection/handling, submission of specimen other ?than nasopharyngeal swab, presence of viral mutation(s) within the ?areas targeted by this assay, and inadequate number of viral ?copies(<138 copies/mL). A negative result must be combined with ?clinical observations,  patient history, and epidemiological ?information. The expected result is Negative. ? ?Fact Sheet for Patients:  ?EntrepreneurPulse.com.au ? ?Fact Sheet for Healthcare Providers:  ?IncredibleEmployment.be ? ?This test is no t yet approved or cleared by the [place] FDA and  ?has been authorized for detection and/or diagnosis of SARS-CoV-2 by ?FDA under an Emergency Use Authorization (EUA). This EUA will remain  ?in effect (meaning this test can be used) for the duration of the ?COVID-19 declaration under Section 564(b)(1) of the Act, 21 ?U.S.C.section 360bbb-3(b)(1), unless the authorization is terminated  ?or revoked sooner.  ? ? ?  ?  Influenza A by PCR NEGATIVE NEGATIVE Final  ? Influenza B by PCR NEGATIVE NEGATIVE Final  ?  Comment: (NOTE) ?The Xpert Xpress SARS-CoV-2/FLU/RSV plus assay is intended as an aid ?in the diagnosis of influenza from Nasopharyngeal swab specimens and ?should not be used as a sole basis for treatment. Nasal washings and ?aspirates are unacceptable for Xpert Xpress SARS-CoV-2/FLU/RSV ?testing. ? ?Fact Sheet for Patients: ?EntrepreneurPulse.com.au ? ?Fact Sheet for Healthcare Providers: ?IncredibleEmployment.be ? ?This test is not yet approved or cleared by the [place] FDA and ?has been authorized for detection and/or diagnosis of SARS-CoV-2 by ?FDA under an Emergency Use Authorization (EUA). This EUA will remain ?in effect (meaning this test can be used) for the duration of the ?COVID-19 declaration under Section 564(b)(1) of the Act, 21 U.S.C. ?section 360bbb-3(b)(1), unless the authorization is terminated or ?revoked. ? ?Performed at [facility], [place], ?[place] [address] ?  ? ? ?Procedures and diagnostic studies: ? ?No results found. ? ? ? ? ? ? ? ? ? ? ? ? LOS: 1 day  ? ?Lewanna Petrak  ?[facility]  ? ?Pager on [email or web address]. If 7PM-7AM, please contact night-coverage at  [email or web address] ? ? ? ? ?01/02/2022, 4:53 PM  ? ? ? ? ? ? ? ? ? ?

## 2022-01-02 NOTE — Progress Notes (Signed)
Physical Therapy Treatment ?Patient Details ?Name: Larry Buckley ?MRN: [id number] ?DOB: 07-10-1941 ?Today's Date: 01/02/2022 ? ? ?History of Present Illness 81 y.o. male with medical history significant for HTN, HLD, gout, dementia, AAA repair who was brought to the ED by his daughter due to abnormal behavior over the past 1 week out of keeping for his dementia. Imaging reveals possible chronic/subacute occipital ischemia changed from prior imaging over a year ago. ? ?  ?PT Comments  ? ? Patient received sitting up in recliner, NT present in room. He is agreeable to ambulate. Patient not oriented. Wants to know where his wallet is. Needs frequent re-direction. He is able to stand from recliner without assistance. He ambulated 500 feet without AD, occasionally reaching out for rail in hallway. No lob. Patient appears to be at baseline level of functioning. He will be safe to ambulate with mobility staff or nursing staff.  ?   ?Recommendations for follow up therapy are one component of a multi-disciplinary discharge planning process, led by the attending physician.  Recommendations may be updated based on patient status, additional functional criteria and insurance authorization. ? ?Follow Up Recommendations ? No PT follow up ?  ?  ?Assistance Recommended at Discharge Intermittent Supervision/Assistance  ?Patient can return home with the following Assist for transportation;Assistance with cooking/housework ?  ?Equipment Recommendations ? None recommended by PT  ?  ?Recommendations for Other Services   ? ? ?  ?Precautions / Restrictions Precautions ?Precautions: Fall ?Restrictions ?Weight Bearing Restrictions: No  ?  ? ?Mobility ? Bed Mobility ?  ?  ?  ?  ?  ?  ?  ?General bed mobility comments: patient up in recliner and remained in recliner at end of session ?  ? ?Transfers ?Overall transfer level: Independent ?Equipment used: None ?  ?  ?  ?  ?  ?  ?  ?General transfer comment: Pt was able to rise to standing w/o  assist, good confidence/safety apart from general confusion needing increased cues ?  ? ?Ambulation/Gait ?Ambulation/Gait assistance: Supervision ?Gait Distance (Feet): 500 Feet ?Assistive device: None ?Gait Pattern/deviations: Step-through pattern ?Gait velocity: WFL ?  ?  ?General Gait Details: occasional reaching out for rail in hallway, no overt LOB. Probably at baseline level of mobility. Increased cues needed for direction. ? ? ?Stairs ?  ?  ?  ?  ?  ? ? ?Wheelchair Mobility ?  ? ?Modified Rankin (Stroke Patients Only) ?  ? ? ?  ?Balance Overall balance assessment: Mild deficits observed, not formally tested ?  ?  ?  ?  ?  ?  ?  ?  ?  ?  ?  ?  ?  ?  ?  ?  ?  ?  ?  ? ?  ?Cognition Arousal/Alertness: Awake/alert ?Behavior During Therapy: [facility] for tasks assessed/performed ?Overall Cognitive Status: No family/caregiver present to determine baseline cognitive functioning ?  ?  ?  ?  ?  ?  ?  ?  ?  ?  ?  ?  ?  ?  ?  ?  ?General Comments: Patient is alert, not oriented ?  ?  ? ?  ?Exercises   ? ?  ?General Comments   ?  ?  ? ?Pertinent Vitals/Pain Pain Assessment ?Pain Assessment: No/denies pain  ? ? ?Home Living   ?  ?  ?  ?  ?  ?  ?  ?  ?  ?   ?  ?Prior Function    ?  ?  ?   ? ?  PT Goals (current goals can now be found in the care plan section) Acute Rehab PT Goals ?Patient Stated Goal: none stated ?PT Goal Formulation: With patient ?Time For Goal Achievement: 01/12/22 ?Progress towards PT goals: Progressing toward goals ? ?  ?Frequency ? ? ? Min 2X/week ? ? ? ?  ?PT Plan Current plan remains appropriate  ? ? ?Co-evaluation   ?  ?  ?  ?  ? ?  ?AM-PAC PT "6 Clicks" Mobility   ?Outcome Measure ? Help needed turning from your back to your side while in a flat bed without using bedrails?: None ?Help needed moving from lying on your back to sitting on the side of a flat bed without using bedrails?: None ?Help needed moving to and from a bed to a chair (including a wheelchair)?: None ?Help needed standing up from a chair  using your arms (e.g., wheelchair or bedside chair)?: None ?Help needed to walk in hospital room?: A Little ?Help needed climbing 3-5 steps with a railing? : A Little ?6 Click Score: 22 ? ?  ?End of Session Equipment Utilized During Treatment: Gait belt ?Activity Tolerance: Patient tolerated treatment well ?Patient left: in chair;with chair alarm set ?Nurse Communication: Mobility status ?PT Visit Diagnosis: Muscle weakness (generalized) (M62.81) ?  ? ? ?Time: 1610-9604 ?PT Time Calculation (min) (ACUTE ONLY): 16 min ? ?Charges:  $Gait Training: 8-22 mins          ?          ? ?Lissa Merlin, PT, GCS ?01/02/22,3:42 PM ? ?

## 2022-01-02 NOTE — NC FL2 (Signed)
?[place] MEDICAID FL2 LEVEL OF CARE SCREENING TOOL  ?  ? ?IDENTIFICATION  ?Patient Name: ?Larry Buckley Birthdate: 1941-01-02 Sex: male Admission Date (Current Location): ?12/28/2021  ?[place] Number: ? [place] ?  Facility and Address:  ?[facility], [address], [place], [place] [address] ?     Provider Number: ?[id number]  ?Attending Physician Name and Address:  ?Lurene Shadow, MD ? Relative Name and Phone Number:  ?LEA,LISA (Daughter)   [phone] (Mobile) ?   ?Current Level of Care: ?Hospital Recommended Level of Care: ?Assisted Living Facility Prior Approval Number: ?  ? ?Date Approved/Denied: ?  PASRR Number: ?[id number] A ? ?Discharge Plan: ?Other (Comment) (ALF) ?  ? ?Current Diagnoses: ?Patient Active Problem List  ? Diagnosis Date Noted  ? Dementia with behavioral disturbance 12/29/2021  ? Altered mental status 12/29/2021  ? Abnormal CT of the head, possible acute ischemia 12/29/2021  ? History of abdominal aortic aneurysm (AAA) repair 09/25/2021  ? Mixed dementia (HCC) 07/11/2021  ? B12 deficiency 07/11/2021  ? Hyperlipidemia, mixed   ? Hypertension   ? Gout, arthropathy   ? ? ?Orientation RESPIRATION BLADDER Height & Weight   ?  ?Self, Place ? Normal Incontinent, External catheter Weight: 88 kg ?Height:  6\' 2"  (188 cm)  ?BEHAVIORAL SYMPTOMS/MOOD NEUROLOGICAL BOWEL NUTRITION STATUS  ?    Continent Diet (Heart)  ?AMBULATORY STATUS COMMUNICATION OF NEEDS Skin   ?Limited Assist (ambulated 500 feet)   Bruising (scattered) ?  ?  ?  ?    ?     ?     ? ? ?Personal Care Assistance Level of Assistance  ?Bathing, Feeding, Dressing Bathing Assistance: Limited assistance ?Feeding assistance: Limited assistance ?Dressing Assistance: Limited assistance ?   ? ?Functional Limitations Info  ?Sight, Hearing, Speech Sight Info: Impaired ?Hearing Info: Impaired ?Speech Info: Adequate  ? ? ?SPECIAL CARE FACTORS FREQUENCY  ?    ?  ?  ?  ?  ?  ?  ?   ? ? ?Contractures  Contractures Info: Not present  ? ? ?Additional Factors Info  ?Code Status, Allergies Code Status Info: FULL CODE ?Allergies Info: Lotensin (Benazepril) ?  ?  ?  ?   ? ?Current Medications (01/02/2022):  This is the current hospital active medication list ?Current Facility-Administered Medications  ?Medication Dose Route Frequency Provider Last Rate Last Admin  ? acetaminophen (TYLENOL) tablet 650 mg  650 mg Oral Q4H PRN 01/04/2022, MD   650 mg at 12/31/21 2101  ? Or  ? acetaminophen (TYLENOL) 160 MG/5ML solution 650 mg  650 mg Per Tube Q4H PRN 2102, MD      ? Or  ? acetaminophen (TYLENOL) suppository 650 mg  650 mg Rectal Q4H PRN Andris Baumann, MD      ? amLODipine (NORVASC) tablet 10 mg  10 mg Oral Daily Andris Baumann, MD   10 mg at 01/02/22 1119  ? aspirin EC tablet 81 mg  81 mg Oral Daily 01/04/22, MD   81 mg at 01/02/22 1119  ? atorvastatin (LIPITOR) tablet 20 mg  20 mg Oral Daily 01/04/22 V, MD   20 mg at 01/02/22 1119  ? divalproex (DEPAKOTE SPRINKLE) capsule 125 mg  125 mg Oral Q12H 01/04/22, NP   125 mg at 01/02/22 1118  ? enoxaparin (LOVENOX) injection 40 mg  40 mg Subcutaneous Q24H 01/04/22 V, MD   40 mg at 01/02/22 1120  ? haloperidol lactate (  HALDOL) injection 5 mg  5 mg Intramuscular Q6H PRN Lurene Shadow, MD   5 mg at 01/01/22 2239  ? mirtazapine (REMERON) tablet 15 mg  15 mg Oral QHS Andris Baumann, MD   15 mg at 01/01/22 2155  ? risperiDONE (RISPERDAL) tablet 0.5 mg  0.5 mg Oral BID Charm Rings, NP   0.5 mg at 01/02/22 1118  ? senna-docusate (Senokot-S) tablet 1 tablet  1 tablet Oral QHS PRN Andris Baumann, MD      ? ? ? ?Discharge Medications: ?Please see discharge summary for a list of discharge medications. ? ?Relevant Imaging Results: ? ?Relevant Lab Results: ? ? ?Additional Information ?SSN [id number] ? ?Caryn Section, RN ? ? ? ? ?

## 2022-01-02 NOTE — TOC Progression Note (Signed)
Transition of Care (TOC) - Progression Note  ? ? ?Patient Details  ?Name: Larry Buckley ?MRN: [id number] ?Date of Birth: [birth year] ? ?Transition of Care (TOC) CM/SW Contact  ?Gildardo Griffes, LCSW ?Phone Number: ?01/02/2022, 3:58 PM ? ?Clinical Narrative:    ? ? ?CSW spoke with patient's daughter [name] who is agreeable for CSW to make referral to Danielle with [facility] at [phone] to assist with identifying placement options. [name] reports she's bringing patient's financial information to the floor, CSW requested she provide to secretary who will put in hard chart. Then Danielle with [facility] will follow up.  ?  ?  ? ?Expected Discharge Plan and Services ?  ?  ?  ?  ?  ?                ?  ?  ?  ?  ?  ?  ?  ?  ?  ?  ? ? ?Social Determinants of Health (SDOH) Interventions ?  ? ?Readmission Risk Interventions ?No flowsheet data found. ? ?

## 2022-01-02 NOTE — Plan of Care (Signed)
  Problem: Education: Goal: Knowledge of General Education information will improve Description: Including pain rating scale, medication(s)/side effects and non-pharmacologic comfort measures Outcome: Progressing   Problem: Health Behavior/Discharge Planning: Goal: Ability to manage health-related needs will improve Outcome: Progressing   Problem: Clinical Measurements: Goal: Ability to maintain clinical measurements within normal limits will improve Outcome: Progressing Goal: Will remain free from infection Outcome: Progressing Goal: Diagnostic test results will improve Outcome: Progressing Goal: Respiratory complications will improve Outcome: Progressing Goal: Cardiovascular complication will be avoided Outcome: Progressing   Problem: Activity: Goal: Risk for activity intolerance will decrease Outcome: Progressing   Problem: Nutrition: Goal: Adequate nutrition will be maintained Outcome: Progressing   Problem: Coping: Goal: Level of anxiety will decrease Outcome: Progressing   Problem: Elimination: Goal: Will not experience complications related to bowel motility Outcome: Progressing Goal: Will not experience complications related to urinary retention Outcome: Progressing   Problem: Pain Managment: Goal: General experience of comfort will improve Outcome: Progressing   Problem: Safety: Goal: Ability to remain free from injury will improve Outcome: Progressing   Problem: Skin Integrity: Goal: Risk for impaired skin integrity will decrease Outcome: Progressing   Problem: Education: Goal: Knowledge of disease or condition will improve Outcome: Progressing Goal: Knowledge of secondary prevention will improve (SELECT ALL) Outcome: Progressing   

## 2022-01-02 NOTE — Chronic Care Management (AMB) (Signed)
?Chronic Care Management  ? ? Clinical Social Work Note ? ?01/02/2022 ?Name: Larry Buckley MRN: [id number] DOB: [birth year] ? ?Larry Buckley is a 81 y.o. year old male who is a primary care patient of Pcp, No. The CCM team was consulted to assist the patient with chronic disease management and/or care coordination needs related to: Mental Health Counseling and Resources.  ? ?Engaged with patient's step-daughter by telephone for follow up visit in response to provider referral for social work chronic care management and care coordination services.  ? ?Consent to Services:  ?The patient was given information about Chronic Care Management services, agreed to services, and gave verbal consent prior to initiation of services.  Please see initial visit note for detailed documentation.  ? ?Patient agreed to services and consent obtained.  ? ?Assessment: Review of patient past medical history, allergies, medications, and health status, including review of relevant consultants reports was performed today as part of a comprehensive evaluation and provision of chronic care management and care coordination services.    ? ?SDOH (Social Determinants of Health) assessments and interventions performed:   ? ?Advanced Directives Status: Not addressed in this encounter. ? ?CCM Care Plan ? ?Allergies  ?Allergen Reactions  ? Lotensin [Benazepril Hcl] Swelling  ? ? ?Facility-Administered Encounter Medications as of 01/02/2022  ?Medication  ? acetaminophen (TYLENOL) tablet 650 mg  ? Or  ? acetaminophen (TYLENOL) 160 MG/5ML solution 650 mg  ? Or  ? acetaminophen (TYLENOL) suppository 650 mg  ? amLODipine (NORVASC) tablet 10 mg  ? aspirin EC tablet 81 mg  ? atorvastatin (LIPITOR) tablet 20 mg  ? divalproex (DEPAKOTE SPRINKLE) capsule 125 mg  ? enoxaparin (LOVENOX) injection 40 mg  ? haloperidol lactate (HALDOL) injection 5 mg  ? mirtazapine (REMERON) tablet 15 mg  ? risperiDONE (RISPERDAL) tablet 0.5 mg  ? senna-docusate (Senokot-S)  tablet 1 tablet  ? ?Outpatient Encounter Medications as of 01/02/2022  ?Medication Sig  ? amLODipine (NORVASC) 10 MG tablet Take 1 tablet (10 mg total) by mouth daily.  ? aspirin EC 81 MG tablet Take 1 tablet (81 mg total) by mouth daily.  ? atorvastatin (LIPITOR) 20 MG tablet Take 1 tablet (20 mg total) by mouth daily.  ? mirtazapine (REMERON) 15 MG tablet Take 15 mg by mouth at bedtime.  ? QUEtiapine (SEROQUEL) 50 MG tablet Take 100 mg by mouth at bedtime.  ? ? ?Patient Active Problem List  ? Diagnosis Date Noted  ? Dementia with behavioral disturbance 12/29/2021  ? Altered mental status 12/29/2021  ? Abnormal CT of the head, possible acute ischemia 12/29/2021  ? History of abdominal aortic aneurysm (AAA) repair 09/25/2021  ? Mixed dementia (HCC) 07/11/2021  ? B12 deficiency 07/11/2021  ? Hyperlipidemia, mixed   ? Hypertension   ? Gout, arthropathy   ? ? ?Conditions to be addressed/monitored: Dementia; Caregiver Stress ? ?Care Plan : LCSW Plan of Care  ?Updates made by Bridgett Larsson, LCSW since 01/02/2022 12:00 AM  ?  ? ?Problem: Dementia Management and Support (General Plan of Care)   ?Priority: Medium  ?Onset Date: 07/22/2021  ?  ? ?Long-Range Goal: Dementia Management   ?Start Date: 07/22/2021  ?This Visit's Progress: On track  ?Recent Progress: On track  ?Priority: Medium  ?Note:   ?Current barriers:   ? Memory Deficits ?Clinical Goals: Patient will work with CCM LCSW to address needs related to management of dementia ?Clinical Interventions:  ?Assessment of needs and barriers to care ?All of the hx was provided  by patient's spouse, Larry Buckley ?Patient can stay home independently while spouse completes medical treatments and/or appointments. He can stay at home. Family receives strong support from adult daughters and neighbors 12/16: Patient's spouse is currently hospitalized resulting in pt being home alone for majority of the day. Pt's step-daughter, Larry Buckley, has been assisting in his care needs. Larry Buckley spoke with  Larry Buckley and followed up with Larry Buckley, Pallative Care LCSW. She has picked up pt's FL2 from PCP office. Larry Buckley plans on taking patient to visit spouse today. She will contact pt's adult sons to discuss pt's healthcare needs this weekend, including her interest in becoming pt's POA/Guardian. CCM LCSW provided Larry Buckley with information regarding Long Care Treatment via email 12/23: Larry Buckley received approval from eldest Buckley to become pt's POA; however, youngest Buckley has disrupted care by reminding pt of not having his vehicle, which in turn resulted in patient becoming hyper-focused on driving. Larry Buckley plans on dropping patient to his oldest Buckley's residence, to provide care for the time being 3/20: Pt's daughter in law reports inability to care for patient due to decline in cognitive functioning and hx of combativeness. States that she can not keep him safe in the home. CCM LCSW strongly encouraged placement to memory care, which hospital staff is assisting her with. She has obtained financial reports to submit to Larry Buckley and DSS to apply for Special Assistance Medicaid for pt ?Patient completes ADL's independently. Denies appetite or sleep concerns ?Patient is not experiencing depression or anxiety symptoms. Spouse and patient are not in need of therapy or supportive resources, at this time ?Spouse reports that they no longer allow patient to drive a vehicle. He does not have access to keys and is reported to have a calm temperament majority of the time 12/22: Larry Buckley utilizes home monitoring system to keep tabs on patient, while she is away from the home ?CCM LCSW reviewed upcoming appointments ?CCM LCSW collaborated with CCM RN and PCP regarding patient needs and the additional support family is receiving through Palliative Care ?Depression screen reviewed  ?Active listening / Reflection utilized  ?Emotional Support Provided ?Provided psychoeducation for mental health needs  ?Quality of sleep assessed & Sleep Hygiene  techniques promoted  ?Verbalization of feelings encouraged  ?Review various resources, discussed options and provided patient information about  ?Dementia resources and support  ?1:1 collaboration with primary care provider regarding development and update of comprehensive plan of care as evidenced by provider attestation and co-signature ?Inter-disciplinary care team collaboration (see longitudinal plan of care) ?Patient Goals/Self-Care Activities: Over the next 120 days ?Continue with compliance of taking medication  ?Increase healthy habits and continue utilizing coping skills ?Contact PCP office with any questions or concerns ?Attend all scheduled appts with providers ? ? ? ?  ?  ? ?Jenel Lucks, MSW, LCSW ?[facility] ?[place]  [facility] ?[email or web address] ?Phone [phone] ?5:54 PM ? ? ? ?

## 2022-01-03 DIAGNOSIS — R410 Disorientation, unspecified: Secondary | ICD-10-CM | POA: Diagnosis not present

## 2022-01-03 DIAGNOSIS — F03918 Unspecified dementia, unspecified severity, with other behavioral disturbance: Secondary | ICD-10-CM | POA: Diagnosis not present

## 2022-01-03 NOTE — TOC Progression Note (Signed)
Transition of Care (TOC) - Progression Note  ? ? ?Patient Details  ?Name: Larry Buckley ?MRN: [id number] ?Date of Birth: October 11, 1941 ? ?Transition of Care (TOC) CM/SW Contact  ?Caryn Section, RN ?Phone Number: ?01/03/2022, 8:58 AM ? ?Clinical Narrative:    House was in room last evening to assess patient for potential admission.  The facility will contact TOC with results of assessment.   ? ?Field seismologist at [facility] assessing patient for Medicaid.  Larry Buckley will communicate with TOC with results of medicaid application. ?TOC to follow. ? ? ? ?  ?  ? ?Expected Discharge Plan and Services ?  ?  ?  ?  ?  ?                ?  ?  ?  ?  ?  ?  ?  ?  ?  ?  ? ? ?Social Determinants of Health (SDOH) Interventions ?  ? ?Readmission Risk Interventions ?No flowsheet data found. ? ?

## 2022-01-03 NOTE — Progress Notes (Signed)
Mobility Specialist - Progress Note ? ? 01/03/22 1000  ?Mobility  ?Activity Ambulated with assistance in hallway;Stood at bedside  ?Range of Motion/Exercises Active  ?Level of Assistance Standby assist, set-up cues, supervision of patient - no hands on  ?Assistive Device None  ?Distance Ambulated (ft) 700 ft  ?Activity Response Tolerated well  ?$Mobility charge 1 Mobility  ? ? ? ?Pt lying in bed upon arrival, utilizing RA. Min encouragement for OOB activity as pt reports being sleepy, agreeable. Pt ambulated to sink for face washing prior to continuation of activity. Ambulated in hallway with close supervision, no AD. No LOB. Pt returned to EOB with needs in reach, sitter at bedside.  ? ? ? ?[name] ?Mobility Specialist ?01/03/22, 11:40 AM ? ? ? ? ?

## 2022-01-03 NOTE — Progress Notes (Signed)
Occupational Therapy Treatment ?Patient Details ?Name: Larry Buckley ?MRN: [id number] ?DOB: [birth year] ?Today's Date: 01/03/2022 ? ? ?History of present illness 81 y.o. male with medical history significant for HTN, HLD, gout, dementia, AAA repair who was brought to the ED by his daughter due to abnormal behavior over the past 1 week out of keeping for his dementia. Imaging reveals possible chronic/subacute occipital ischemia changed from prior imaging over a year ago. ?  ?OT comments ? Pt seen for OT treatment on this date. Upon arrival to room, pt awake and sitting EOB with 1:1 sitter present. Pt A&Ox1 only, but agreeable to OT tx. Pt currently requires SUPERVISION for x3 standing grooming tasks, SUPERVISION for standing UB dressing, and SUPERVISION for functional mobility of household distances (184ft) without AD d/t decreased safety awareness and decreased dynamic standing balance. At end of session, pt left sitting upright in recliner, with 1:1 sitter and lunch set-up. Pt is making good progress toward goals and continues to benefit from skilled OT services to maximize return to PLOF and minimize risk of future falls, injury, caregiver burden, and readmission. Will continue to follow POC. Discharge recommendation remains appropriate.    ? ?Recommendations for follow up therapy are one component of a multi-disciplinary discharge planning process, led by the attending physician.  Recommendations may be updated based on patient status, additional functional criteria and insurance authorization. ?   ?Follow Up Recommendations ? Outpatient OT  ?  ?Assistance Recommended at Discharge Intermittent Supervision/Assistance  ?Patient can return home with the following ? Assistance with cooking/housework;Direct supervision/assist for medications management ?  ?Equipment Recommendations ? None recommended by OT  ?  ?   ?Precautions / Restrictions Precautions ?Precautions: Fall ?Restrictions ?Weight Bearing Restrictions:  No  ? ? ?  ? ?Mobility Bed Mobility ?  ?  ?  ?  ?  ?  ?  ?General bed mobility comments: pt seated EOB and in recline upon arrival ?  ? ?Transfers ?Overall transfer level: Independent ?Equipment used: None ?  ?  ?  ?  ?  ?  ?  ?General transfer comment: Pt was able to rise to standing w/o assist, good confidence/safety apart from general confusion needing increased cues ?  ?  ?Balance Overall balance assessment: Mild deficits observed, not formally tested ?  ?  ?  ?  ?  ?  ?  ?  ?  ?  ?  ?  ?  ?  ?  ?  ?  ?  ?   ? ?ADL either performed or assessed with clinical judgement  ? ?ADL Overall ADL's : Needs assistance/impaired ?Eating/Feeding: Set up;Sitting ?  ?Grooming: Wash/dry hands;Wash/dry face;Applying deodorant;Supervision/safety;Standing ?Grooming Details (indicate cue type and reason): Requires verbal cues for sequencing ?  ?  ?  ?  ?Upper Body Dressing : Supervision/safety;Standing ?Upper Body Dressing Details (indicate cue type and reason): to doff jacket. Requires supervision for dyanamic standing balance ?  ?  ?  ?  ?  ?  ?  ?  ?Functional mobility during ADLs: Supervision/safety (to walk 163ft without AD) ?  ?  ? ? ? ?Cognition Arousal/Alertness: Awake/alert ?Behavior During Therapy: [facility] for tasks assessed/performed ?Overall Cognitive Status: No family/caregiver present to determine baseline cognitive functioning ?  ?  ?  ?  ?  ?  ?  ?  ?  ?  ?  ?  ?  ?  ?  ?  ?General Comments: Pt alert and oriented to self only. inconsistently follows 1-step instructions ?  ?  ?   ?   ?   ?   ? ? ?  Pertinent Vitals/ Pain       Pain Assessment ?Pain Assessment: No/denies pain ? ?   ?   ? ?Frequency ? Min 2X/week  ? ? ? ? ?  ?Progress Toward Goals ? ?OT Goals(current goals can now be found in the care plan section) ? Progress towards OT goals: Progressing toward goals ? ?Acute Rehab OT Goals ?Patient Stated Goal: to go home ?OT Goal Formulation: With patient ?Time For Goal Achievement: 01/13/22 ?Potential to Achieve Goals:  Good  ?Plan Discharge plan remains appropriate;Frequency remains appropriate   ? ?   ?AM-PAC OT "6 Clicks" Daily Activity     ?Outcome Measure ? ? Help from another person eating meals?: None ?Help from another person taking care of personal grooming?: A Little ?Help from another person toileting, which includes using toliet, bedpan, or urinal?: A Little ?Help from another person bathing (including washing, rinsing, drying)?: A Little ?Help from another person to put on and taking off regular upper body clothing?: None ?Help from another person to put on and taking off regular lower body clothing?: None ?6 Click Score: 21 ? ?  ?End of Session   ? ?OT Visit Diagnosis: Other abnormalities of gait and mobility (R26.89) ?  ?Activity Tolerance Patient tolerated treatment well ?  ?Patient Left in chair;with call bell/phone within reach;with nursing/sitter in room ?  ?Nurse Communication Mobility status ?  ? ?   ? ?Time: 1610-9604 ?OT Time Calculation (min): 23 min ? ?Charges: OT General Charges ?$OT Visit: 1 Visit ?OT Treatments ?$Self Care/Home Management : 23-37 mins ? ?Matthew Folks, OTR/L ?ASCOM [phone] ? ?

## 2022-01-03 NOTE — TOC Progression Note (Signed)
Transition of Care (TOC) - Progression Note  ? ? ?Patient Details  ?Name: Larry Buckley ?MRN: [id number] ?Date of Birth: 05-09-1941 ? ?Transition of Care (TOC) CM/SW Contact  ?Caryn Section, RN ?Phone Number: ?01/03/2022, 1:29 PM ? ?Clinical Narrative:   Victorino Dike from [facility] states they have accepted patient clinically, however they are waiting on financial information to finalize acceptance.  They are in touch with patient's stepdaughter and will relay decision to [facility] when decision is final.  TOC to follow. ? ? ? ?  ?  ? ?Expected Discharge Plan and Services ?  ?  ?  ?  ?  ?                ?  ?  ?  ?  ?  ?  ?  ?  ?  ?  ? ? ?Social Determinants of Health (SDOH) Interventions ?  ? ?Readmission Risk Interventions ?No flowsheet data found. ? ?

## 2022-01-03 NOTE — Progress Notes (Signed)
? ? ? ?Progress Note  ? ? ?Larry Buckley  [id number] DOB: Jul 25, 1941  DOA: 12/28/2021 ?PCP: Pcp, No  ? ? ? ? ?Brief Narrative:  ? ? ?Medical records reviewed and are as summarized below: ? ?Larry Buckley is a 81 y.o. male with medical history significant for hypertension, hyperlipidemia, gout, stroke (old infarcts on MRI) dementia, AAA repair, who was brought to the hospital because of worsening abnormal behavior for about 1 week.  Reportedly, he crawled out of his bathroom window and was found by his neighbors. ? ?CT head was concerning for acute stroke.  However MRI brain did not show any evidence of acute stroke ? ? ? ? ? ? ?Assessment/Plan:  ? ?Principal Problem: ?  Altered mental status ?Active Problems: ?  Abnormal CT of the head, possible acute ischemia ?  Dementia with behavioral disturbance ?  Hyperlipidemia, mixed ?  Hypertension ?  Gout, arthropathy ?  History of abdominal aortic aneurysm (AAA) repair ? ? ?Body mass index is 24.91 kg/m?. ? ? ?Dementia with behavioral disturbance: No evidence of acute stroke on MRI brain.  Continue risperidone (started on 01/01/2022) and Remeron.  Seroquel has been discontinued by psychiatrist.  Follow-up with case manager to assist with disposition to SNF/memory care unit ? ?Hypertension: Continue amlodipine ? ?Other comorbidities include hyperlipidemia, gout, AAA repair ? ?I called Lattie Haw, daughter-in-law, at 1:19 PM today to discuss plan of care but there was no response ? ?Diet Order   ? ?       ?  Diet Heart Room service appropriate? Yes; Fluid consistency: Thin  Diet effective now       ?  ? ?  ?  ? ?  ? ? ? ? ? ? ? ? ? ?Consultants: ?Psychiatrist ? ?Procedures: ?None ? ? ? ?Medications:  ? ? amLODipine  10 mg Oral Daily  ? aspirin EC  81 mg Oral Daily  ? atorvastatin  20 mg Oral Daily  ? divalproex  125 mg Oral Q12H  ? enoxaparin (LOVENOX) injection  40 mg Subcutaneous Q24H  ? melatonin  2.5 mg Oral Once  ? mirtazapine  15 mg Oral QHS  ? risperiDONE  0.5  mg Oral BID  ? ?Continuous Infusions: ? ? ?Anti-infectives (From admission, onward)  ? ? None  ? ?  ? ? ? ? ? ? ? ? ? ?Family Communication/Anticipated D/C date and plan/Code Status  ? ?DVT prophylaxis: enoxaparin (LOVENOX) injection 40 mg Start: 12/29/21 0800 ? ? ?  Code Status: Full Code ? ?Family Communication: None ?Disposition Plan: Plan to discharge to memory care unit/SNF ? ? ? ? ? ? ? ?Subjective:  ? ?He has no complaints.  He is pleasant but confused. ? ? ?Objective:  ? ? ?Vitals:  ? 01/02/22 0720 01/02/22 1111 01/02/22 1537 01/02/22 2004  ?BP: 137/67 (!) 141/60 112/62 97/69  ?Pulse: (!) 54 73 68 76  ?Resp: 16 17 16 16   ?Temp: 98.5 ?F (36.9 ?C) 98.5 ?F (36.9 ?C) 98.3 ?F (36.8 ?C)   ?TempSrc:      ?SpO2: 100% 98% 99% 99%  ?Weight:      ?Height:      ? ?No data found. ? ? ?Intake/Output Summary (Last 24 hours) at 01/03/2022 1320 ?Last data filed at 01/02/2022 1406 ?Gross per 24 hour  ?Intake 200 ml  ?Output --  ?Net 200 ml  ? ?Filed Weights  ? 12/28/21 2206 12/29/21 0258  ?Weight: 90 kg 88 kg  ? ? ?Exam: ? ?  GEN: NAD, sitting up on the bed ?SKIN: No rash ?EYES: EOMI ?ENT: MMM ?CV: RRR ?PULM: CTA B ?ABD: soft, ND, NT, +BS ?CNS: AAO x 1 (person), non focal ?EXT: No edema or tenderness ?PSYCH: Poor insight and judgment ?  ? ? ?Data Reviewed:  ? ?I have personally reviewed following labs and imaging studies: ? ?Labs: ?Labs show the following:  ? ?Basic Metabolic Panel: ?Recent Labs  ?Lab 12/28/21 ?2210 12/29/21 ?0041  ?NA 140 140  ?K 4.1 4.0  ?CL 104 105  ?CO2 28 29  ?GLUCOSE 126* 123*  ?BUN 16 14  ?CREATININE 0.99 0.85  ?CALCIUM 8.9 8.8*  ? ?GFR ?Estimated Creatinine Clearance: 79.2 mL/min (by C-G formula based on SCr of 0.85 mg/dL). ?Liver Function Tests: ?Recent Labs  ?Lab 12/28/21 ?2210  ?AST 18  ?ALT 14  ?ALKPHOS 60  ?BILITOT 0.3  ?PROT 7.7  ?ALBUMIN 3.5  ? ?No results for input(s): LIPASE, AMYLASE in the last 168 hours. ?No results for input(s): AMMONIA in the last 168 hours. ?Coagulation profile ?Recent Labs   ?Lab 12/29/21 ?0041  ?INR 1.0  ? ? ?CBC: ?Recent Labs  ?Lab 12/28/21 ?2210  ?WBC 5.3  ?NEUTROABS 2.9  ?HGB 13.3  ?HCT 41.8  ?MCV 94.4  ?PLT 308  ? ?Cardiac Enzymes: ?No results for input(s): CKTOTAL, CKMB, CKMBINDEX, TROPONINI in the last 168 hours. ?BNP (last 3 results) ?No results for input(s): PROBNP in the last 8760 hours. ?CBG: ?Recent Labs  ?Lab 12/29/21 ?0248  ?GLUCAP 133*  ? ?D-Dimer: ?No results for input(s): DDIMER in the last 72 hours. ?Hgb A1c: ?No results for input(s): HGBA1C in the last 72 hours. ? ?Lipid Profile: ?No results for input(s): CHOL, HDL, LDLCALC, TRIG, CHOLHDL, LDLDIRECT in the last 72 hours. ? ?Thyroid function studies: ?No results for input(s): TSH, T4TOTAL, T3FREE, THYROIDAB in the last 72 hours. ? ?Invalid input(s): FREET3 ?Anemia work up: ?No results for input(s): VITAMINB12, FOLATE, FERRITIN, TIBC, IRON, RETICCTPCT in the last 72 hours. ?Sepsis Labs: ?Recent Labs  ?Lab 12/28/21 ?2210  ?WBC 5.3  ? ? ?Microbiology ?Recent Results (from the past 240 hour(s))  ?Resp Panel by RT-PCR (Flu A&B, Covid) Nasopharyngeal Swab     Status: None  ? Collection Time: 12/29/21 12:41 AM  ? Specimen: Nasopharyngeal Swab; Nasopharyngeal(NP) swabs in vial transport medium  ?Result Value Ref Range Status  ? SARS Coronavirus 2 by RT PCR NEGATIVE NEGATIVE Final  ?  Comment: (NOTE) ?SARS-CoV-2 target nucleic acids are NOT DETECTED. ? ?The SARS-CoV-2 RNA is generally detectable in upper respiratory ?specimens during the acute phase of infection. The lowest ?concentration of SARS-CoV-2 viral copies this assay can detect is ?138 copies/mL. A negative result does not preclude SARS-Cov-2 ?infection and should not be used as the sole basis for treatment or ?other patient management decisions. A negative result may occur with  ?improper specimen collection/handling, submission of specimen other ?than nasopharyngeal swab, presence of viral mutation(s) within the ?areas targeted by this assay, and inadequate number  of viral ?copies(<138 copies/mL). A negative result must be combined with ?clinical observations, patient history, and epidemiological ?information. The expected result is Negative. ? ?Fact Sheet for Patients:  ?EntrepreneurPulse.com.au ? ?Fact Sheet for Healthcare Providers:  ?IncredibleEmployment.be ? ?This test is no t yet approved or cleared by the [place] FDA and  ?has been authorized for detection and/or diagnosis of SARS-CoV-2 by ?FDA under an Emergency Use Authorization (EUA). This EUA will remain  ?in effect (meaning this test can be used) for the duration of  the ?COVID-19 declaration under Section 564(b)(1) of the Act, 21 ?U.S.C.section 360bbb-3(b)(1), unless the authorization is terminated  ?or revoked sooner.  ? ? ?  ? Influenza A by PCR NEGATIVE NEGATIVE Final  ? Influenza B by PCR NEGATIVE NEGATIVE Final  ?  Comment: (NOTE) ?The Xpert Xpress SARS-CoV-2/FLU/RSV plus assay is intended as an aid ?in the diagnosis of influenza from Nasopharyngeal swab specimens and ?should not be used as a sole basis for treatment. Nasal washings and ?aspirates are unacceptable for Xpert Xpress SARS-CoV-2/FLU/RSV ?testing. ? ?Fact Sheet for Patients: ?EntrepreneurPulse.com.au ? ?Fact Sheet for Healthcare Providers: ?IncredibleEmployment.be ? ?This test is not yet approved or cleared by the [place] FDA and ?has been authorized for detection and/or diagnosis of SARS-CoV-2 by ?FDA under an Emergency Use Authorization (EUA). This EUA will remain ?in effect (meaning this test can be used) for the duration of the ?COVID-19 declaration under Section 564(b)(1) of the Act, 21 U.S.C. ?section 360bbb-3(b)(1), unless the authorization is terminated or ?revoked. ? ?Performed at [facility], [place], ?[place] [address] ?  ? ? ?Procedures and diagnostic studies: ? ?No results found. ? ? ? ? ? ? ? ? ? ? ? ? LOS: 2 days  ? ?Shereena Berquist  ?[facility]  ? ?Pager on [email or web address]. If 7PM-7AM, please contact night-coverage at [email or web address] ? ? ? ? ?01/03/2022, 1:20 PM  ? ? ? ? ? ? ? ? ? ?

## 2022-01-03 NOTE — Plan of Care (Signed)
  Problem: Education: Goal: Knowledge of disease or condition will improve Outcome: Progressing Goal: Knowledge of secondary prevention will improve (SELECT ALL) Outcome: Progressing   

## 2022-01-03 NOTE — Progress Notes (Signed)
Pt pacing in room. Becoming agitated that he could not walk outside the room. Kept stating he was in a car lot and needed to get to his car. Did not like it when he was told he couldn't leave. 5mg  IM haldol given per PRN order.  ?

## 2022-01-04 DIAGNOSIS — E782 Mixed hyperlipidemia: Secondary | ICD-10-CM | POA: Diagnosis not present

## 2022-01-04 DIAGNOSIS — I10 Essential (primary) hypertension: Secondary | ICD-10-CM | POA: Diagnosis not present

## 2022-01-04 DIAGNOSIS — F03918 Unspecified dementia, unspecified severity, with other behavioral disturbance: Secondary | ICD-10-CM | POA: Diagnosis not present

## 2022-01-04 DIAGNOSIS — R7303 Prediabetes: Secondary | ICD-10-CM | POA: Diagnosis present

## 2022-01-04 DIAGNOSIS — Z9889 Other specified postprocedural states: Secondary | ICD-10-CM

## 2022-01-04 LAB — RESP PANEL BY RT-PCR (FLU A&B, COVID) ARPGX2
Influenza A by PCR: NEGATIVE
Influenza B by PCR: NEGATIVE
SARS Coronavirus 2 by RT PCR: NEGATIVE

## 2022-01-04 MED ORDER — TUBERCULIN PPD 5 UNIT/0.1ML ID SOLN
5.0000 [IU] | Freq: Once | INTRADERMAL | Status: AC
  Administered 2022-01-04: 5 [IU] via INTRADERMAL
  Filled 2022-01-04: qty 0.1

## 2022-01-04 NOTE — Progress Notes (Signed)
[facility] Progress Note ? ?Patient: Larry Buckley    NAT:[id number]  DOA: 12/28/2021    ?Date of Service: the patient was seen and examined on 01/04/2022 ? ?Brief hospital course: ?Patient is an 81 year old male with past medical history of CVA, hypertension, AAA status postrepair and senile dementia brought into the emergency room on 3/15 for increasing agitation and confusion for the past week.  Initially CT of head suspicious for acute CVA, however MRI unremarkable.  Work-up felt to be worsening dementia.  Family unable to care for patient and plan is for patient to go to skilled nursing with memory care unit. ? ?Assessment and Plan: ?Assessment and Plan: ?Dementia with behavioral disturbance ?Continue Remeron and Seroquel with as needed Haldol.  Fall, aspiration and delirium precautions.  Psychiatry following.  Plan is for skilled nursing facility with memory care unit. ? ?Hyperlipidemia, mixed ?Continue atorvastatin ? ?Hypertension ?Blood pressure stable.  Continue amlodipine. ? ?History of abdominal aortic aneurysm (AAA) repair ?Continue aspirin and atorvastatin ? ?Prediabetes ?CBG stable.  A1c on admission at 6.3. ? ?Gout, arthropathy ?Not currently on any related meds and not acutely flared ? ? ? ? ? ? ?Body mass index is 24.91 kg/m?.  ?  ?   ? ?Consultants: ?Psychiatry ? ?Procedures: ?None ? ?Antimicrobials: ?None ? ?Code Status: Full code ? ? ?Subjective: Doing okay, no complaints ? ?Objective: ?Noted borderline bradycardia ?Vitals:  ? 01/04/22 0006 01/04/22 4270  ?BP: 136/70 130/66  ?Pulse: (!) 58 (!) 52  ?Resp: 14 15  ?Temp: (!) 97.5 ?F (36.4 ?C) 97.6 ?F (36.4 ?C)  ?SpO2: 100% 100%  ? ?No intake or output data in the 24 hours ending 01/04/22 1335 ?Filed Weights  ? 12/28/21 2206 12/29/21 0258  ?Weight: 90 kg 88 kg  ? ?Body mass index is 24.91 kg/m?. ? ?Exam: ? ?General: Alert and oriented x1, no acute distress ?HEENT: Normocephalic, atraumatic, mucous membranes are moist ?Cardiovascular:  Regular rate and rhythm, S1-S2 ?Respiratory: Clear to auscultation bilaterally ?Abdomen: Soft, nontender, nondistended, positive bowel sounds ?Musculoskeletal: No clubbing or cyanosis or edema ?Skin: No skin breaks, tears or lesions ?Psychiatry: Appropriate, no evidence of psychoses ?Neurology: No focal deficits ? ?Data Reviewed: ?There are no new results to review at this time. ? ?Disposition:  ?Status is: Inpatient ?Remains inpatient appropriate because: Awaiting approval for memory care unit ?  ? ?Family Communication: Left message with family ?DVT Prophylaxis: ?enoxaparin (LOVENOX) injection 40 mg Start: 12/29/21 0800 ? ? ? ?Author: ?Hollice Espy ,MD ?01/04/2022 1:35 PM ? ?To reach On-call, see care teams to locate the attending and reach out via [email or web address]. ?Between 7PM-7AM, please contact night-coverage ?If you still have difficulty reaching the attending provider, please page the [facility] (Director on Call) for [facility] on amion for assistance. ? ?

## 2022-01-04 NOTE — Hospital Course (Addendum)
Patient is an 81 year old male with past medical history of CVA, hypertension, AAA status postrepair and senile dementia brought into the emergency room on 3/15 for increasing agitation and confusion for the past week.  Initially CT of head suspicious for acute CVA, however MRI unremarkable.  Work-up felt to be worsening dementia.  Initial plan was for patient to go to skilled nursing unit with memory care.  However, facility unable to take patient due to financial arrangement issues.  Initial plan was for patient's daughter working to arrange care at home, on Monday, 3/27.  Over the course of 3/26, patient a bit more agitated, especially at night.  Ambulates freely in the hall without assistance.  Patient's daughter without good options and had plan to come pick up patient on 3/27 morning and take him to her work, before bringing him back to her house.  Case management able to step in and assisting patient daughter with looking for additional options and resources with plans.  1 option is the [facility] hosp[facility] dementia program.   ? ?On 3/28, patient noted to have a low-grade axillary temperature 100.7.  Pending discharge canceled and infection being worked up. ?

## 2022-01-04 NOTE — Progress Notes (Signed)
Mobility Specialist - Progress Note ? ? 01/04/22 1700  ?Mobility  ?Activity Ambulated with assistance in hallway  ?Level of Assistance Standby assist, set-up cues, supervision of patient - no hands on  ?Assistive Device None  ?Distance Ambulated (ft) 500 ft  ?Activity Response Tolerated well  ?$Mobility charge 1 Mobility  ? ? ? ?Pt ambulated in hallway with supervision. No LOB. Pt left in recliner with sitter present.  ? ? ?[name] ?Mobility Specialist ?01/04/22, 5:01 PM ? ? ? ? ?

## 2022-01-04 NOTE — Assessment & Plan Note (Signed)
CBG stable.  A1c on admission at 6.3. ?

## 2022-01-04 NOTE — TOC Progression Note (Signed)
Transition of Care (TOC) - Progression Note  ? ? ?Patient Details  ?Name: Larry Buckley ?MRN: [id number] ?Date of Birth: January 07, 1941 ? ?Transition of Care (TOC) CM/SW Contact  ?Pete Pelt, RN ?Phone Number: ?01/04/2022, 2:24 PM ? ?Clinical Narrative:   RNCM spoke with Anderson Malta at [facility].  Patient is accepted, they just need financial information from daughter.  They are attempting to admit patient tomorrow or Friday, depending on when they get the information.  They are calling daughter today. ? ?[place] will need PPD test and COVID testing prior to admission.  Hospitalist notified. ? ? ? ?  ?  ? ?Expected Discharge Plan and Services ?  ?  ?  ?  ?  ?                ?  ?  ?  ?  ?  ?  ?  ?  ?  ?  ? ? ?Social Determinants of Health (SDOH) Interventions ?  ? ?Readmission Risk Interventions ?   ? View : No data to display.  ?  ?  ?  ? ? ?

## 2022-01-04 NOTE — Progress Notes (Signed)
Occupational Therapy Treatment ?Patient Details ?Name: Larry Buckley ?MRN: [id number] ?DOB: October 20, 1940 ?Today's Date: 01/04/2022 ? ? ?History of present illness 81 y.o. male with medical history significant for HTN, HLD, gout, dementia, AAA repair who was brought to the ED by his daughter due to abnormal behavior over the past 1 week out of keeping for his dementia. Imaging reveals possible chronic/subacute occipital ischemia changed from prior imaging over a year ago. ?  ?OT comments ? Pt seen for OT treatment on this date. Upon arrival to room, pt awake and sitting upright in bed. Pt oriented to self only, reporting no pain, and agreeable to OT tx. Pt currently requires MIN A for LB dressing (requires assist for tying shoes), SUPERVISION for standing grooming tasks, and SUPERVISION for functional mobility of short household distances without AD d/t decreased safety awareness and decreased dynamic standing balance. At end of session, pt left sitting upright in recliner, with 1:1 sitter present. Pt is making good progress toward goals and continues to benefit from skilled OT services to maximize return to PLOF and minimize risk of future falls, injury, caregiver burden, and readmission. Will continue to follow POC. Discharge recommendation remains appropriate.    ? ?Recommendations for follow up therapy are one component of a multi-disciplinary discharge planning process, led by the attending physician.  Recommendations may be updated based on patient status, additional functional criteria and insurance authorization. ?   ?Follow Up Recommendations ? Outpatient OT  ?  ?Assistance Recommended at Discharge Intermittent Supervision/Assistance  ?Patient can return home with the following ? Assistance with cooking/housework;Direct supervision/assist for medications management ?  ?Equipment Recommendations ? None recommended by OT  ?  ?   ?Precautions / Restrictions Precautions ?Precautions: Fall ?Restrictions ?Weight  Bearing Restrictions: No  ? ? ?  ? ?Mobility Bed Mobility ?Overal bed mobility: Independent ?  ?  ?  ?  ?  ?  ?General bed mobility comments: pt seated EOB and in recline upon arrival ?  ? ?Transfers ?Overall transfer level: Independent ?Equipment used: None ?  ?  ?  ?  ?  ?  ?  ?  ?  ?  ?Balance Overall balance assessment: Mild deficits observed, not formally tested ?  ?  ?  ?  ?  ?  ?  ?  ?  ?  ?  ?  ?  ?  ?  ?  ?  ?  ?   ? ?ADL either performed or assessed with clinical judgement  ? ?ADL Overall ADL's : Needs assistance/impaired ?  ?  ?Grooming: Wash/dry hands;Wash/dry face;Supervision/safety;Standing (shaving face) ?Grooming Details (indicate cue type and reason): Able to stand for ~15 mins while engaging in standing groomin tasks ?  ?  ?  ?  ?  ?  ?Lower Body Dressing: Minimal assistance;Sitting/lateral leans ?Lower Body Dressing Details (indicate cue type and reason): Requires MIN A to fasten shoelaces. Able to don with SET-UP assist ?  ?  ?  ?  ?  ?  ?  ?  ?  ? ? ? ?Cognition Arousal/Alertness: Awake/alert ?Behavior During Therapy: [facility] for tasks assessed/performed ?Overall Cognitive Status: No family/caregiver present to determine baseline cognitive functioning ?  ?  ?  ?  ?  ?  ?  ?  ?  ?  ?  ?  ?  ?  ?  ?  ?General Comments: Pt alert and oriented to self only. Consistently followed 1-step instructions this date ?  ?  ?   ?   ?   ?   ? ? ?  Pertinent Vitals/ Pain       Pain Assessment ?Pain Assessment: No/denies pain ? ?   ?   ? ?Frequency ? Min 2X/week  ? ? ? ? ?  ?Progress Toward Goals ? ?OT Goals(current goals can now be found in the care plan section) ? Progress towards OT goals: Progressing toward goals ? ?Acute Rehab OT Goals ?Patient Stated Goal: to go home ?OT Goal Formulation: With patient ?Time For Goal Achievement: 01/13/22 ?Potential to Achieve Goals: Good  ?Plan Discharge plan remains appropriate;Frequency remains appropriate   ? ?   ?AM-PAC OT "6 Clicks" Daily Activity     ?Outcome Measure ? ?  Help from another person eating meals?: None ?Help from another person taking care of personal grooming?: A Little ?Help from another person toileting, which includes using toliet, bedpan, or urinal?: A Little ?Help from another person bathing (including washing, rinsing, drying)?: A Little ?Help from another person to put on and taking off regular upper body clothing?: None ?Help from another person to put on and taking off regular lower body clothing?: None ?6 Click Score: 21 ? ?  ?End of Session   ? ?OT Visit Diagnosis: Other abnormalities of gait and mobility (R26.89) ?  ?Activity Tolerance Patient tolerated treatment well ?  ?Patient Left in chair;with call bell/phone within reach;with nursing/sitter in room ?  ?Nurse Communication Mobility status ?  ? ?   ? ?Time: 3976-7341 ?OT Time Calculation (min): 29 min ? ?Charges: OT General Charges ?$OT Visit: 1 Visit ?OT Treatments ?$Self Care/Home Management : 23-37 mins ? ?Matthew Folks, OTR/L ?ASCOM [phone] ? ?

## 2022-01-05 DIAGNOSIS — R4182 Altered mental status, unspecified: Secondary | ICD-10-CM | POA: Diagnosis not present

## 2022-01-05 DIAGNOSIS — Z9889 Other specified postprocedural states: Secondary | ICD-10-CM | POA: Diagnosis not present

## 2022-01-05 DIAGNOSIS — F03918 Unspecified dementia, unspecified severity, with other behavioral disturbance: Secondary | ICD-10-CM | POA: Diagnosis not present

## 2022-01-05 DIAGNOSIS — I10 Essential (primary) hypertension: Secondary | ICD-10-CM | POA: Diagnosis not present

## 2022-01-05 LAB — CREATININE, SERUM
Creatinine, Ser: 1 mg/dL (ref 0.61–1.24)
GFR, Estimated: >60 mL/min (ref >60)

## 2022-01-05 MED ORDER — HALOPERIDOL 0.5 MG PO TABS
0.5000 mg | ORAL_TABLET | Freq: Two times a day (BID) | ORAL | Status: DC
  Administered 2022-01-06 – 2022-01-11 (×10): 0.5 mg via ORAL
  Filled 2022-01-05 (×11): qty 1

## 2022-01-05 NOTE — Progress Notes (Signed)
Mobility Specialist - Progress Note ? ? 01/05/22 1100  ?Mobility  ?Activity Ambulated with assistance in hallway  ?Level of Assistance Standby assist, set-up cues, supervision of patient - no hands on  ?Assistive Device None  ?Distance Ambulated (ft) 350 ft  ?Activity Response Tolerated well  ?$Mobility charge 1 Mobility  ? ? ? ?Pt sitting in recliner upon arrival, utilizing RA. Pt donned socks and shoes without assist prior to ambulation. Pt ambulated in hallway with supervision, no LOB. No complaints, just cold. Still confused. Pt returned to room with sitter present.  ? ? ?Larry Buckley ?Mobility Specialist ?01/05/22, 11:33 AM ? ? ? ? ?

## 2022-01-05 NOTE — TOC Progression Note (Signed)
Transition of Care (TOC) - Progression Note  ? ? ?Patient Details  ?Name: Larry Buckley ?MRN: [id number] ?Date of Birth: [birth year] ? ?Transition of Care (TOC) CM/SW Contact  ?Caryn Section, RN ?Phone Number: ?01/05/2022, 9:35 AM ? ?Clinical Narrative:   Patient's daughter will meet with [facility] at 10am today.  Victorino Dike from [employer] will contact RNCM following meeting in regards to status of patient acceptance to facility. ? ? ? ?  ?  ? ?Expected Discharge Plan and Services ?  ?  ?  ?  ?  ?                ?  ?  ?  ?  ?  ?  ?  ?  ?  ?  ? ? ?Social Determinants of Health (SDOH) Interventions ?  ? ?Readmission Risk Interventions ?   ? View : No data to display.  ?  ?  ?  ? ? ?

## 2022-01-05 NOTE — Progress Notes (Signed)
[facility] Progress Note ? ?Patient: Larry Buckley    NFA:[id number]  DOA: 12/28/2021    ?Date of Service: the patient was seen and examined on 01/05/2022 ? ?Brief hospital course: ?Patient is an 81 year old male with past medical history of CVA, hypertension, AAA status postrepair and senile dementia brought into the emergency room on 3/15 for increasing agitation and confusion for the past week.  Initially CT of head suspicious for acute CVA, however MRI unremarkable.  Work-up felt to be worsening dementia.  Family unable to care for patient and plan is for patient to go to skilled nursing with memory care unit, once bed is available. ? ?Assessment and Plan: ?Assessment and Plan: ?Dementia with behavioral disturbance ?Continue Remeron and Seroquel with as needed Haldol.  Fall, aspiration and delirium precautions.  Psychiatry following.  Plan is for skilled nursing facility with memory care unit. ? ?Hyperlipidemia, mixed ?Continue atorvastatin ? ?Hypertension ?Blood pressure stable.  Continue amlodipine. ? ?History of abdominal aortic aneurysm (AAA) repair ?Continue aspirin and atorvastatin ? ?Prediabetes ?CBG stable.  A1c on admission at 6.3. ? ?Gout, arthropathy ?Not currently on any related meds and not acutely flared ? ? ? ? ? ? ?Body mass index is 24.91 kg/m?.  ?  ?   ? ?Consultants: ?Psychiatry ? ?Procedures: ?None ? ?Antimicrobials: ?None ? ?Code Status: Full code ? ? ?Subjective: Doing okay, no complaints ? ?Objective: ?Noted borderline bradycardia ?Vitals:  ? 01/05/22 0556 01/05/22 1137  ?BP: 139/71 (!) 142/64  ?Pulse: 62 62  ?Resp: 16 16  ?Temp: 97.6 ?F (36.4 ?C) 98.2 ?F (36.8 ?C)  ?SpO2: 100% 99%  ? ?No intake or output data in the 24 hours ending 01/05/22 1450 ?Filed Weights  ? 12/28/21 2206 12/29/21 0258  ?Weight: 90 kg 88 kg  ? ?Body mass index is 24.91 kg/m?. ? ?Exam: ? ?General: Alert and oriented x1, no acute distress ?HEENT: Normocephalic, atraumatic, mucous membranes are  moist ?Cardiovascular: Regular rate and rhythm, S1-S2 ?Respiratory: Clear to auscultation bilaterally ?Abdomen: Soft, nontender, nondistended, positive bowel sounds ?Musculoskeletal: No clubbing or cyanosis or edema ?Skin: No skin breaks, tears or lesions ?Psychiatry: Appropriate, no evidence of psychoses ?Neurology: No focal deficits ? ?Data Reviewed: ?There are no new results to review at this time. ? ?Disposition:  ?Status is: Inpatient ?Remains inpatient appropriate because: Awaiting approval for memory care unit ?  ? ?Family Communication: Left message with family ?DVT Prophylaxis: ?enoxaparin (LOVENOX) injection 40 mg Start: 12/29/21 0800 ? ? ? ?Author: ?Hollice Espy ,MD ?01/05/2022 2:50 PM ? ?To reach On-call, see care teams to locate the attending and reach out via [email or web address]. ?Between 7PM-7AM, please contact night-coverage ?If you still have difficulty reaching the attending provider, please page the [facility] (Director on Call) for [facility] on amion for assistance. ? ?

## 2022-01-06 DIAGNOSIS — F03918 Unspecified dementia, unspecified severity, with other behavioral disturbance: Secondary | ICD-10-CM | POA: Diagnosis not present

## 2022-01-06 DIAGNOSIS — E782 Mixed hyperlipidemia: Secondary | ICD-10-CM | POA: Diagnosis not present

## 2022-01-06 DIAGNOSIS — I10 Essential (primary) hypertension: Secondary | ICD-10-CM | POA: Diagnosis not present

## 2022-01-06 LAB — BASIC METABOLIC PANEL
Anion gap: 9 (ref 5–15)
BUN: 21 mg/dL (ref 8–23)
CO2: 27 mmol/L (ref 22–32)
Calcium: 8.9 mg/dL (ref 8.9–10.3)
Chloride: 103 mmol/L (ref 98–111)
Creatinine, Ser: 0.8 mg/dL (ref 0.61–1.24)
GFR, Estimated: >60 mL/min (ref >60)
Glucose, Bld: 94 mg/dL (ref 70–99)
Potassium: 3.8 mmol/L (ref 3.5–5.1)
Sodium: 139 mmol/L (ref 135–145)

## 2022-01-06 LAB — CBC
HCT: 40.7 % (ref 39.0–52.0)
Hemoglobin: 13 g/dL (ref 13.0–17.0)
MCH: 29.7 pg (ref 26.0–34.0)
MCHC: 31.9 g/dL (ref 30.0–36.0)
MCV: 93.1 fL (ref 80.0–100.0)
Platelets: 295 10*3/uL (ref 150–400)
RBC: 4.37 MIL/uL (ref 4.22–5.81)
RDW: 13.4 % (ref 11.5–15.5)
WBC: 5.1 10*3/uL (ref 4.0–10.5)
nRBC: 0 % (ref 0.0–0.2)

## 2022-01-06 NOTE — Care Management Important Message (Signed)
Important Message ? ?Patient Details  ?Name: Larry Buckley ?MRN: [id number] ?Date of Birth: October 31, 1940 ? ? ?Medicare Important Message Given:  Other (see comment) ? ?I called daughter to review the Important Message from Medicare but had to leave a VM for her to call me at her convenience. I will await a return call.  ? ? ?Olegario Messier A Omelia Marquart ?01/06/2022, 1:19 PM ?

## 2022-01-06 NOTE — Progress Notes (Signed)
Mobility Specialist - Progress Note ? ? 01/06/22 1500  ?Mobility  ?Activity Ambulated with assistance in hallway  ?Level of Assistance Standby assist, set-up cues, supervision of patient - no hands on  ?Assistive Device None  ?Distance Ambulated (ft) 500 ft  ?Activity Response Tolerated well  ?$Mobility charge 1 Mobility  ? ? ? ?Pt sitting in recliner upon arrival, utilizing RA. Pt confused, thinks he's in jail. "What did I do that was so bad to get in here?" "What did you do to get in here? You're too pretty to be in here." Reoriented pt to place, situation, and role throughout session. Pt donned shoes on with extensive time. Ambulated in hallway with supervision, no LOB. Pt returned to recliner with sitter present.  ? ? ?[name] ?Mobility Specialist ?01/06/22, 3:35 PM ? ? ? ? ?

## 2022-01-06 NOTE — Progress Notes (Signed)
Occupational Therapy Treatment ?Patient Details ?Name: Larry Buckley ?MRN: [id number] ?DOB: 03-15-1941 ?Today's Date: 01/06/2022 ? ? ?History of present illness 81 y.o. male with medical history significant for HTN, HLD, gout, dementia, AAA repair who was brought to the ED by his daughter due to abnormal behavior over the past 1 week out of keeping for his dementia. Imaging reveals possible chronic/subacute occipital ischemia changed from prior imaging over a year ago. ?  ?OT comments ? Larry Buckley was eager to engage in OT today and performed well, able to complete fxl mobility tasks with Mod I-SUPV, able to maintain good standing balance w/o any UE support, able to reach beyond BOS in standing and to perform UE therex, able to consistently follow 1-step directions, and to engage in appropriate conversation. Following transfer to recliner, pt was able to fluidly perform magic tricks for therapist, using a deck of cards in the room. Pt is making good progress toward goals and continues to benefit from skilled OT services to maximize return to PLOF and minimize risk of future falls, injury, caregiver burden, and readmission. Will continue to follow POC. Discharge recommendation remains appropriate.  ? ?Recommendations for follow up therapy are one component of a multi-disciplinary discharge planning process, led by the attending physician.  Recommendations may be updated based on patient status, additional functional criteria and insurance authorization. ?   ?Follow Up Recommendations ? Outpatient OT  ?  ?Assistance Recommended at Discharge Intermittent Supervision/Assistance  ?Patient can return home with the following ? Assistance with cooking/housework;Direct supervision/assist for medications management;Assist for transportation;Direct supervision/assist for financial management;A little help with bathing/dressing/bathroom ?  ?Equipment Recommendations ? None recommended by OT  ?  ?Recommendations for Other  Services   ? ?  ?Precautions / Restrictions Precautions ?Precautions: Fall  ? ? ?  ? ?Mobility Bed Mobility ?Overal bed mobility: Modified Independent ?  ?  ?  ?  ?  ?  ?  ?  ? ?Transfers ?Overall transfer level: Modified independent ?Equipment used: None ?  ?  ?  ?  ?  ?  ?  ?General transfer comment: Able to come into standing w/o assist ?  ?  ?Balance Overall balance assessment: Modified Independent ?  ?  ?  ?  ?  ?  ?  ?  ?  ?  ?  ?  ?  ?  ?  ?  ?  ?  ?   ? ?ADL either performed or assessed with clinical judgement  ? ?ADL   ?Eating/Feeding: Modified independent;Set up;Sitting ?  ?Grooming: Wash/dry hands;Wash/dry face;Standing;Supervision/safety ?  ?  ?  ?  ?  ?Upper Body Dressing : Sitting;Supervision/safety ?  ?  ?  ?  ?  ?  ?  ?  ?  ?Functional mobility during ADLs: Supervision/safety ?  ?  ? ?Extremity/Trunk Assessment Upper Extremity Assessment ?Upper Extremity Assessment: Overall WFL for tasks assessed ?  ?Lower Extremity Assessment ?Lower Extremity Assessment: Overall WFL for tasks assessed ?  ?Cervical / Trunk Assessment ?Cervical / Trunk Assessment: Normal ?  ? ?Vision   ?  ?  ?Perception   ?  ?Praxis   ?  ? ?Cognition Arousal/Alertness: Awake/alert ?Behavior During Therapy: [facility] for tasks assessed/performed ?  ?  ?  ?  ?  ?  ?  ?  ?  ?  ?  ?  ?  ?  ?  ?  ?  ?General Comments: Alert, pleasant, funny. Oriented to self only. Able to consistently follow 1-step  directions ?  ?  ?   ?Exercises Other Exercises ?Other Exercises: UE therex in standing ? ?  ?Shoulder Instructions   ? ? ?  ?General Comments    ? ? ?Pertinent Vitals/ Pain       Pain Assessment ?Pain Assessment: No/denies pain ?Breathing: normal ?Negative Vocalization: none ?Facial Expression: smiling or inexpressive ?Body Language: relaxed ?Consolability: no need to console ?PAINAD Score: 0 ? ?Home Living   ?  ?  ?  ?  ?  ?  ?  ?  ?  ?  ?  ?  ?  ?  ?  ?  ?  ?  ? ?  ?Prior Functioning/Environment    ?  ?  ?  ?   ? ?Frequency ? Min 2X/week  ? ? ? ? ?   ?Progress Toward Goals ? ?OT Goals(current goals can now be found in the care plan section) ? Progress towards OT goals: Progressing toward goals ? ?Acute Rehab OT Goals ?Patient Stated Goal: to go home ?OT Goal Formulation: With patient ?Time For Goal Achievement: 01/13/22 ?Potential to Achieve Goals: Good  ?Plan Discharge plan remains appropriate;Frequency remains appropriate   ? ?Co-evaluation ? ? ?   ?  ?  ?  ?  ? ?  ?AM-PAC OT "6 Clicks" Daily Activity     ?Outcome Measure ? ? Help from another person eating meals?: None ?Help from another person taking care of personal grooming?: A Little ?Help from another person toileting, which includes using toliet, bedpan, or urinal?: A Little ?Help from another person bathing (including washing, rinsing, drying)?: A Little ?Help from another person to put on and taking off regular upper body clothing?: None ?Help from another person to put on and taking off regular lower body clothing?: None ?6 Click Score: 21 ? ?  ?End of Session   ? ?OT Visit Diagnosis: Other abnormalities of gait and mobility (R26.89) ?  ?Activity Tolerance Patient tolerated treatment well ?  ?Patient Left in chair;with call bell/phone within reach;with nursing/sitter in room ?  ?Nurse Communication   ?  ? ?   ? ?Time: 1343-1400 ?OT Time Calculation (min): 17 min ? ?Charges: OT General Charges ?$OT Visit: 1 Visit ?OT Treatments ?$Self Care/Home Management : 8-22 mins ? ?Latina Craver, PhD, MS, OTR/L ?01/06/22, 2:13 PM ? ?

## 2022-01-06 NOTE — Progress Notes (Addendum)
[facility] Progress Note ? ?Patient: Larry Buckley    XNA:[id number]  DOA: 12/28/2021    ?Date of Service: the patient was seen and examined on 01/06/2022 ? ?Brief hospital course: ?Patient is an 81 year old male with past medical history of CVA, hypertension, AAA status postrepair and senile dementia brought into the emergency room on 3/15 for increasing agitation and confusion for the past week.  Initially CT of head suspicious for acute CVA, however MRI unremarkable.  Work-up felt to be worsening dementia.  Initial plan was for patient to go to skilled nursing unit with memory care.  However, facility unable to take patient due to financial arrangement issues.  Patient's daughter working to arrange care at home. ? ?Assessment and Plan: ?Assessment and Plan: ?Dementia with behavioral disturbance ?Continue Remeron and Seroquel with as needed Haldol.  Fall, aspiration and delirium precautions.  Psychiatry following.  Unfortunately, unable to go to memory care unit due to financial issues.  Patient's daughter working to make arrangements for patient to come home. ? ?Hyperlipidemia, mixed ?Continue atorvastatin ? ?Hypertension ?Blood pressure stable.  Continue amlodipine. ? ?History of abdominal aortic aneurysm (AAA) repair ?Continue aspirin and atorvastatin ? ?Prediabetes ?CBG stable.  A1c on admission at 6.3. ? ?Gout, arthropathy ?Not currently on any related meds and not acutely flared ? ? ? ? ? ? ?Body mass index is 24.91 kg/m?.  ?  ?   ? ?Consultants: ?Psychiatry ? ?Procedures: ?None ? ?Antimicrobials: ?None ? ?Code Status: Full code ? ? ?Subjective: Doing okay, no complaints ? ?Objective: ?Noted borderline bradycardia ?Vitals:  ? 01/05/22 2020 01/06/22 0315  ?BP: 139/70 (!) 133/54  ?Pulse: 65 60  ?Resp: 18 17  ?Temp: 98.8 ?F (37.1 ?C) 98.3 ?F (36.8 ?C)  ?SpO2: 99% 99%  ? ? ?Intake/Output Summary (Last 24 hours) at 01/06/2022 1316 ?Last data filed at 01/06/2022 0700 ?Gross per 24 hour  ?Intake 0 ml   ?Output 0 ml  ?Net 0 ml  ? ?Filed Weights  ? 12/28/21 2206 12/29/21 0258  ?Weight: 90 kg 88 kg  ? ?Body mass index is 24.91 kg/m?. ? ?Exam: ? ?General: Alert and oriented x1, no acute distress ?HEENT: Normocephalic, atraumatic, mucous membranes are moist ?Cardiovascular: Regular rate and rhythm, S1-S2 ?Respiratory: Clear to auscultation bilaterally ?Abdomen: Soft, nontender, nondistended, positive bowel sounds ?Musculoskeletal: No clubbing or cyanosis or edema ?Skin: No skin breaks, tears or lesions ?Psychiatry: Appropriate, no evidence of psychoses ?Neurology: No focal deficits ? ?Data Reviewed: ?There are no new results to review at this time. ? ?Disposition:  ?Status is: Inpatient ?Remains inpatient appropriate because: Daughter finishing arrangements ?Anticipated discharge date is 3/27 ? ?Family Communication: Left message with daughter ?DVT Prophylaxis: ?enoxaparin (LOVENOX) injection 40 mg Start: 12/29/21 0800 ? ? ? ?Author: ?Hollice Espy ,MD ?01/06/2022 1:16 PM ? ?To reach On-call, see care teams to locate the attending and reach out via [email or web address]. ?Between 7PM-7AM, please contact night-coverage ?If you still have difficulty reaching the attending provider, please page the [facility] (Director on Call) for [facility] on amion for assistance. ? ?

## 2022-01-06 NOTE — TOC Progression Note (Signed)
Transition of Care (TOC) - Progression Note  ? ? ?Patient Details  ?Name: Larry Buckley ?MRN: [id number] ?Date of Birth: 01/18/1941 ? ?Transition of Care (TOC) CM/SW Contact  ?Caryn Section, RN ?Phone Number: ?01/06/2022, 8:40 AM ? ?Clinical Narrative:   [facility] contacted [facility] stating they are unable to accept patient because they cannot financially ensure payments to the facility. ? ?Daughter stated yesterday that she would possibly transport patient home today, and RNCM provided resources to daughter for [employer] project CARES, [employer] helpline, and [facility] to assist with setting up home for reception.   Daughter has left a message for [facility] today stating that she does not have anyone to care for patient at home.   ? ?RNCM informed care team and will continue to follow today. ? ? ?  ?  ? ?Expected Discharge Plan and Services ?  ?  ?  ?  ?  ?                ?  ?  ?  ?  ?  ?  ?  ?  ?  ?  ? ? ?Social Determinants of Health (SDOH) Interventions ?  ? ?Readmission Risk Interventions ?   ? View : No data to display.  ?  ?  ?  ? ? ?

## 2022-01-07 DIAGNOSIS — R7303 Prediabetes: Secondary | ICD-10-CM

## 2022-01-07 NOTE — Plan of Care (Signed)
  Problem: Education: Goal: Knowledge of General Education information will improve Description: Including pain rating scale, medication(s)/side effects and non-pharmacologic comfort measures Outcome: Progressing   Problem: Health Behavior/Discharge Planning: Goal: Ability to manage health-related needs will improve Outcome: Progressing   Problem: Clinical Measurements: Goal: Ability to maintain clinical measurements within normal limits will improve Outcome: Progressing Goal: Will remain free from infection Outcome: Progressing Goal: Diagnostic test results will improve Outcome: Progressing Goal: Respiratory complications will improve Outcome: Progressing Goal: Cardiovascular complication will be avoided Outcome: Progressing   Problem: Activity: Goal: Risk for activity intolerance will decrease Outcome: Progressing   Problem: Nutrition: Goal: Adequate nutrition will be maintained Outcome: Progressing   Problem: Coping: Goal: Level of anxiety will decrease Outcome: Progressing   Problem: Elimination: Goal: Will not experience complications related to bowel motility Outcome: Progressing Goal: Will not experience complications related to urinary retention Outcome: Progressing   Problem: Pain Managment: Goal: General experience of comfort will improve Outcome: Progressing   Problem: Safety: Goal: Ability to remain free from injury will improve Outcome: Progressing   Problem: Skin Integrity: Goal: Risk for impaired skin integrity will decrease Outcome: Progressing   Problem: Education: Goal: Knowledge of disease or condition will improve Outcome: Progressing Goal: Knowledge of secondary prevention will improve (SELECT ALL) Outcome: Progressing   

## 2022-01-07 NOTE — Progress Notes (Signed)
[facility] Progress Note ? ?Patient: Larry Buckley    CBJ:[id number]  DOA: 12/28/2021    ?Date of Service: the patient was seen and examined on 01/07/2022 ? ?Brief hospital course: ?Patient is an 81 year old male with past medical history of CVA, hypertension, AAA status postrepair and senile dementia brought into the emergency room on 3/15 for increasing agitation and confusion for the past week.  Initially CT of head suspicious for acute CVA, however MRI unremarkable.  Work-up felt to be worsening dementia.  Initial plan was for patient to go to skilled nursing unit with memory care.  However, facility unable to take patient due to financial arrangement issues.  Patient's daughter working to arrange care at home, on Monday, 3/27. ? ?Assessment and Plan: ?Assessment and Plan: ?Dementia with behavioral disturbance ?Continue Remeron and Seroquel with as needed Haldol.  Fall, aspiration and delirium precautions.  Psychiatry following.  Unfortunately, unable to go to memory care unit due to financial issues.  Patient's daughter working to make arrangements for patient to come home. ? ?Hyperlipidemia, mixed ?Continue atorvastatin ? ?Hypertension ?Blood pressure stable.  Continue amlodipine. ? ?History of abdominal aortic aneurysm (AAA) repair ?Continue aspirin and atorvastatin ? ?Prediabetes ?CBG stable.  A1c on admission at 6.3. ? ?Gout, arthropathy ?Not currently on any related meds and not acutely flared ? ? ? ? ? ? ?Body mass index is 24.91 kg/m?.  ?  ?   ? ?Consultants: ?Psychiatry ? ?Procedures: ?None ? ?Antimicrobials: ?None ? ?Code Status: Full code ? ? ?Subjective: Doing okay, no complaints ? ?Objective: ?Noted borderline bradycardia ?Vitals:  ? 01/06/22 2038 01/07/22 0618  ?BP: 131/62 (!) 118/59  ?Pulse: (!) 56 (!) 53  ?Resp: 19 18  ?Temp: 97.6 ?F (36.4 ?C) 97.7 ?F (36.5 ?C)  ?SpO2: 98% 100%  ? ? ?Intake/Output Summary (Last 24 hours) at 01/07/2022 1353 ?Last data filed at 01/07/2022 0900 ?Gross per  24 hour  ?Intake 120 ml  ?Output 700 ml  ?Net -580 ml  ? ? ?Filed Weights  ? 12/28/21 2206 12/29/21 0258  ?Weight: 90 kg 88 kg  ? ?Body mass index is 24.91 kg/m?. ? ?Exam: ? ?General: Alert and oriented x1, no acute distress ?HEENT: Normocephalic, atraumatic, mucous membranes are moist ?Cardiovascular: Regular rate and rhythm, S1-S2 ?Respiratory: Clear to auscultation bilaterally ?Abdomen: Soft, nontender, nondistended, positive bowel sounds ?Musculoskeletal: No clubbing or cyanosis or edema ?Skin: No skin breaks, tears or lesions ?Psychiatry: Appropriate, no evidence of psychoses ?Neurology: No focal deficits ? ?Data Reviewed: ?There are no new results to review at this time. ? ?Disposition:  ?Status is: Inpatient ?Remains inpatient appropriate because: Daughter finishing arrangements ?Anticipated discharge date is 3/27 ? ?Family Communication: Left message with daughter ?DVT Prophylaxis: ?enoxaparin (LOVENOX) injection 40 mg Start: 12/29/21 0800 ? ? ? ?Author: ?Hollice Espy ,MD ?01/07/2022 1:53 PM ? ?To reach On-call, see care teams to locate the attending and reach out via [email or web address]. ?Between 7PM-7AM, please contact night-coverage ?If you still have difficulty reaching the attending provider, please page the [facility] (Director on Call) for [facility] on amion for assistance. ? ?

## 2022-01-07 NOTE — Progress Notes (Signed)
Cross Cover ?Patient unable to void and required in and out cah ? ?

## 2022-01-07 NOTE — Progress Notes (Signed)
Mobility Specialist - Progress Note ? ? 01/07/22 1100  ?Mobility  ?Activity Ambulated with assistance in hallway;Stood at bedside;Dangled on edge of bed  ?Level of Assistance Contact guard assist, steadying assist  ?Assistive Device None;Front wheel walker  ?Distance Ambulated (ft) 480 ft  ?Activity Response Tolerated well  ?$Mobility charge 1 Mobility  ? ? ? ?During mobility:80 HR,  98%SpO2 ? ?Pt sitting in chair using RA with sitter present. Pt donned shoes with ModI and extra time. Pt completes STS with SUPERVISION using furniture for stability. Pt ambulates 2 laps with no AD and CGA--- 2 significant LOB. RW used for final lap and pt voices feeling more steady and comfortable with AD. Vc during ambulation for direction. Pt believes he is in jail and notes not remembering how and why he is here. Education provided. Pt left in chair with sitter present. ? ?Merrily Brittle ?Mobility Specialist ?01/07/22, 11:33 AM ? ? ? ? ?

## 2022-01-08 DIAGNOSIS — E782 Mixed hyperlipidemia: Secondary | ICD-10-CM | POA: Diagnosis not present

## 2022-01-08 DIAGNOSIS — I10 Essential (primary) hypertension: Secondary | ICD-10-CM | POA: Diagnosis not present

## 2022-01-08 DIAGNOSIS — F03918 Unspecified dementia, unspecified severity, with other behavioral disturbance: Secondary | ICD-10-CM | POA: Diagnosis not present

## 2022-01-08 MED ORDER — HALOPERIDOL LACTATE 5 MG/ML IJ SOLN
5.0000 mg | Freq: Once | INTRAMUSCULAR | Status: AC
  Administered 2022-01-08: 5 mg via INTRAMUSCULAR
  Filled 2022-01-08: qty 1

## 2022-01-08 MED ORDER — DIPHENHYDRAMINE HCL 50 MG/ML IJ SOLN: 25.0000 mg | Freq: Once | INTRAMUSCULAR | Status: DC

## 2022-01-08 MED ORDER — DIPHENHYDRAMINE HCL 50 MG/ML IJ SOLN
25.0000 mg | Freq: Once | INTRAMUSCULAR | Status: AC
  Administered 2022-01-08: 25 mg via INTRAMUSCULAR
  Filled 2022-01-08: qty 1

## 2022-01-08 MED ORDER — HALOPERIDOL LACTATE 5 MG/ML IJ SOLN: 5.0000 mg | Freq: Once | INTRAMUSCULAR | Status: DC

## 2022-01-08 MED ORDER — OLANZAPINE 10 MG IM SOLR
5.0000 mg | Freq: Once | INTRAMUSCULAR | Status: DC
Start: 2022-01-09 — End: 2022-01-08

## 2022-01-08 NOTE — Progress Notes (Signed)
Patient is agitated despite PM meds and Haldol 5mg . Patient is trying to push his way past staff becoming aggressive at staff, cursing at staff. Attempting to elope. Diversional activities with TV and food have not been successful. Neomia Glass, NP notified. New order for Haldol 5mg  and Benadryl 25mg  received. Medications administered. Will continue to monitor.  ?

## 2022-01-08 NOTE — Progress Notes (Signed)
Mobility Specialist - Progress Note ? ? ? 01/08/22 1100  ?Mobility  ?Activity Stood at bedside;Ambulated with assistance in hallway;Dangled on edge of bed  ?Level of Assistance Standby assist, set-up cues, supervision of patient - no hands on  ?Assistive Device None  ?Distance Ambulated (ft) 320 ft  ?Activity Response Tolerated well  ?$Mobility charge 1 Mobility  ? ? ? ?Pt standing at door with nurse tech upon arrival using RA. Pt ambulates 2 laps with SBA d/t right drift ---- vc for safe ambulation. CGA for second lap. Pt returned to chair with needs and sitter at bedside. ? ?Clarisa Schools ?Mobility Specialist ?01/08/22, 11:31 AM ? ? ? ? ?

## 2022-01-08 NOTE — TOC Progression Note (Signed)
Transition of Care (TOC) - Progression Note  ? ? ?Patient Details  ?Name: Larry Buckley ?MRN: [id number] ?Date of Birth: January 01, 1941 ? ?Transition of Care (TOC) CM/SW Contact  ?Caryn Section, RN ?Phone Number: ?01/08/2022, 2:51 PM ? ?Clinical Narrative:   Daughter has reached out to her family and friends to inquire about assistance to care for patient (she is the only caregiver), family refuses to care for patient.  Daughter planning to take patient home tomorrow morning. She also states that her husband and her daughters are not able to assist her to care for him. She does have an alarm system in the home, she has reached out to the [employer] for assistance, and daughter stated there was no information that could assist her prior to taking him home.  She states she will keep trying to reach out to the community. ? ?  ? ?  ?  ? ?Expected Discharge Plan and Services ?  ?  ?  ?  ?  ?                ?  ?  ?  ?  ?  ?  ?  ?  ?  ?  ? ? ?Social Determinants of Health (SDOH) Interventions ?  ? ?Readmission Risk Interventions ?   ? View : No data to display.  ?  ?  ?  ? ? ?

## 2022-01-08 NOTE — Progress Notes (Signed)
[facility] Progress Note ? ?Patient: Larry Buckley    OVF:[id number]  DOA: 12/28/2021    ?Date of Service: the patient was seen and examined on 01/08/2022 ? ?Brief hospital course: ?Patient is an 81 year old male with past medical history of CVA, hypertension, AAA status postrepair and senile dementia brought into the emergency room on 3/15 for increasing agitation and confusion for the past week.  Initially CT of head suspicious for acute CVA, however MRI unremarkable.  Work-up felt to be worsening dementia.  Initial plan was for patient to go to skilled nursing unit with memory care.  However, facility unable to take patient due to financial arrangement issues.  Patient's daughter working to arrange care at home, on Monday, 3/27. ? ?Assessment and Plan: ?Assessment and Plan: ?Dementia with behavioral disturbance ?Continue Remeron and Seroquel with as needed Haldol.  Fall, aspiration and delirium precautions.  Psychiatry following.  Unfortunately, unable to go to memory care unit due to financial issues.  Patient's daughter working to make arrangements for patient to come home.  Patient usually calm although today, he is refusing labs and some medicines.  Intermittently agitated. ? ?Hyperlipidemia, mixed ?Continue atorvastatin ? ?Hypertension ?Blood pressure stable.  Continue amlodipine. ? ?History of abdominal aortic aneurysm (AAA) repair ?Continue aspirin and atorvastatin ? ?Prediabetes ?CBG stable.  A1c on admission at 6.3. ? ?Gout, arthropathy ?Not currently on any related meds and not acutely flared ? ? ? ? ? ? ?Body mass index is 24.91 kg/m?.  ?  ?   ? ?Consultants: ?Psychiatry ? ?Procedures: ?None ? ?Antimicrobials: ?None ? ?Code Status: Full code ? ? ?Subjective: Less interactive today ? ?Objective: ?Noted borderline bradycardia ?Vitals:  ? 01/08/22 0450 01/08/22 1142  ?BP: 133/77 134/63  ?Pulse: 61 62  ?Resp: 17 18  ?Temp: 97.9 ?F (36.6 ?C) 98.6 ?F (37 ?C)  ?SpO2: 100% 100%  ? ? ?Intake/Output  Summary (Last 24 hours) at 01/08/2022 1401 ?Last data filed at 01/08/2022 0531 ?Gross per 24 hour  ?Intake 360 ml  ?Output 450 ml  ?Net -90 ml  ? ?Filed Weights  ? 12/28/21 2206 12/29/21 0258  ?Weight: 90 kg 88 kg  ? ?Body mass index is 24.91 kg/m?. ? ?Exam: ? ?General: Alert and oriented x1, no acute distress ?HEENT: Normocephalic, atraumatic, mucous membranes are moist ?Cardiovascular: Regular rate and rhythm, S1-S2 ?Respiratory: Clear to auscultation bilaterally ?Abdomen: Soft, nontender, nondistended, positive bowel sounds ?Musculoskeletal: No clubbing or cyanosis or edema ?Skin: No skin breaks, tears or lesions ?Psychiatry: Appropriate, no evidence of psychoses ?Neurology: No focal deficits ? ?Data Reviewed: ?There are no new results to review at this time. ? ?Disposition:  ?Status is: Inpatient ?Remains inpatient appropriate because: Daughter finishing arrangements ?Anticipated discharge date is 3/27 ? ?Family Communication: Left message with daughter ?DVT Prophylaxis: ?enoxaparin (LOVENOX) injection 40 mg Start: 12/29/21 0800 ? ? ? ?Author: ?Hollice Espy ,MD ?01/08/2022 2:01 PM ? ?To reach On-call, see care teams to locate the attending and reach out via [email or web address]. ?Between 7PM-7AM, please contact night-coverage ?If you still have difficulty reaching the attending provider, please page the [facility] (Director on Call) for [facility] on amion for assistance. ? ?

## 2022-01-09 ENCOUNTER — Telehealth: Payer: Medicare PPO

## 2022-01-09 DIAGNOSIS — I10 Essential (primary) hypertension: Secondary | ICD-10-CM | POA: Diagnosis not present

## 2022-01-09 DIAGNOSIS — E782 Mixed hyperlipidemia: Secondary | ICD-10-CM | POA: Diagnosis not present

## 2022-01-09 DIAGNOSIS — F03918 Unspecified dementia, unspecified severity, with other behavioral disturbance: Secondary | ICD-10-CM | POA: Diagnosis not present

## 2022-01-09 NOTE — Progress Notes (Signed)
[facility] Progress Note ? ?Patient: Larry Buckley    EXH:[id number]  DOA: 12/28/2021    ?Date of Service: the patient was seen and examined on 01/09/2022 ? ?Brief hospital course: ?Patient is an 81 year old male with past medical history of CVA, hypertension, AAA status postrepair and senile dementia brought into the emergency room on 3/15 for increasing agitation and confusion for the past week.  Initially CT of head suspicious for acute CVA, however MRI unremarkable.  Work-up felt to be worsening dementia.  Initial plan was for patient to go to skilled nursing unit with memory care.  However, facility unable to take patient due to financial arrangement issues.  Initial plan was for patient's daughter working to arrange care at home, on Monday, 3/27.  Over the course of 3/26, patient a bit more agitated, especially at night.  Ambulates freely in the hall without assistance.  Patient's daughter without good options and had plan to come pick up patient on 3/27 morning and take him to her work, before bringing him back to her house.  Case management able to step in and assisting patient daughter with looking for additional options and resources with plans to keep patient in the hospital another 24 hours. ? ?Assessment and Plan: ?Assessment and Plan: ?Dementia with behavioral disturbance ?Continue Remeron and Seroquel with as needed Haldol.  Fall, aspiration and delirium precautions.  Psychiatry following.  Unfortunately, unable to go to memory care unit due to financial issues.    Patient usually calm although today, he is refusing labs and some medicines.  Intermittently agitated.  Case management assisting patient's daughter looking for additional resources. ? ?Hyperlipidemia, mixed ?Continue atorvastatin ? ?Hypertension ?Blood pressure stable.  Continue amlodipine. ? ?History of abdominal aortic aneurysm (AAA) repair ?Continue aspirin and atorvastatin ? ?Prediabetes ?CBG stable.  A1c on admission at  6.3. ? ?Gout, arthropathy ?Not currently on any related meds and not acutely flared ? ? ? ? ? ? ?Body mass index is 24.91 kg/m?.  ?  ?   ? ?Consultants: ?Psychiatry ? ?Procedures: ?None ? ?Antimicrobials: ?None ? ?Code Status: Full code ? ? ?Subjective: Less interactive today ? ?Objective: ?Noted borderline bradycardia ?Vitals:  ? 01/09/22 1055 01/09/22 1345  ?BP: 127/73 140/67  ?Pulse: 62 65  ?Resp: 15   ?Temp: 98.4 ?F (36.9 ?C) (!) 96.9 ?F (36.1 ?C)  ?SpO2: 100%   ? ?No intake or output data in the 24 hours ending 01/09/22 1350 ? ?Filed Weights  ? 12/28/21 2206 12/29/21 0258  ?Weight: 90 kg 88 kg  ? ?Body mass index is 24.91 kg/m?. ? ?Exam: ? ?General: Alert and oriented x1, no acute distress ?HEENT: Normocephalic, atraumatic, mucous membranes are moist ?Cardiovascular: Regular rate and rhythm, S1-S2 ?Respiratory: Clear to auscultation bilaterally ?Abdomen: Soft, nontender, nondistended, positive bowel sounds ?Musculoskeletal: No clubbing or cyanosis or edema ?Skin: No skin breaks, tears or lesions ?Psychiatry: Appropriate, no evidence of psychoses ?Neurology: No focal deficits ? ?Data Reviewed: ?There are no new results to review at this time. ? ?Disposition:  ?Status is: Inpatient ?Remains inpatient appropriate because: Daughter finishing arrangements ?Anticipated discharge date is 3/28 ? ?Family Communication: Left message with daughter ?DVT Prophylaxis: ?enoxaparin (LOVENOX) injection 40 mg Start: 12/29/21 0800 ? ? ? ?Author: ?Hollice Espy ,MD ?01/09/2022 1:50 PM ? ?To reach On-call, see care teams to locate the attending and reach out via [email or web address]. ?Between 7PM-7AM, please contact night-coverage ?If you still have difficulty reaching the attending provider, please page the [facility] (Director on Call) for  [facility] on amion for assistance. ? ?

## 2022-01-09 NOTE — TOC Progression Note (Signed)
Transition of Care (TOC) - Progression Note  ? ? ?Patient Details  ?Name: Larry Buckley ?MRN: [id number] ?Date of Birth: 02-Jun-1941 ? ?Transition of Care (TOC) CM/SW Contact  ?Caryn Section, RN ?Phone Number: ?01/09/2022, 11:22 AM ? ?Clinical Narrative:   Daughter was going to come pick patient up for discharge, but patient was very sedated due to agitation during the night.  Patient's daughter in to see patient, tearful.  She states she will search for facilities for transfer today.  She has been in touch with Danielle from care patrol, who is assisting.  Daughter will tour the [facility] at [place] [place] and [place] today and will contact TOC following tours with follow up information.   ? ? ? ?  ?  ? ?Expected Discharge Plan and Services ?  ?  ?  ?  ?  ?                ?  ?  ?  ?  ?  ?  ?  ?  ?  ?  ? ? ?Social Determinants of Health (SDOH) Interventions ?  ? ?Readmission Risk Interventions ?   ? View : No data to display.  ?  ?  ?  ? ? ?

## 2022-01-09 NOTE — Progress Notes (Signed)
Mobility Specialist - Progress Note ? ? 01/09/22 1200  ?Mobility  ?Activity Refused mobility  ? ? ? ?2nd attempt this date. Pt too lethargic to participate in mobility right now. Will attempt another date/time.  ? ? ?[name] ?Mobility Specialist ?01/09/22, 12:31 PM ? ? ? ? ?

## 2022-01-09 NOTE — Progress Notes (Signed)
Patient refusing VS and EKG. Becomes physically aggressive with staff when attempting to redirect patient. Attempts to push staff out of his path. Continues with sitter. Docia Furl, NP notified of patient's refusals.  ?

## 2022-01-09 NOTE — TOC Progression Note (Signed)
Transition of Care (TOC) - Progression Note  ? ? ?Patient Details  ?Name: Dewel Lotter ?MRN: [id number] ?Date of Birth: 04-17-1941 ? ?Transition of Care (TOC) CM/SW Contact  ?Caryn Section, RN ?Phone Number: ?01/09/2022, 7:58 AM ? ?Clinical Narrative:   Patient's daughter is coming in at 0830 to discharge him.  RNCM and daughter spoke about other memory care facilities as she is the only caregiver for him.  Daughter states she does not want to consider others at this time, as she is working with [employer], [employer] and waiting for his insurance. ? ?Resources given to daughter regarding wandering and other dementia behaviors.  Care team aware of discharge. ? ? ? ?  ?  ? ?Expected Discharge Plan and Services ?  ?  ?  ?  ?  ?                ?  ?  ?  ?  ?  ?  ?  ?  ?  ?  ? ? ?Social Determinants of Health (SDOH) Interventions ?  ? ?Readmission Risk Interventions ?   ? View : No data to display.  ?  ?  ?  ? ? ?

## 2022-01-09 NOTE — Progress Notes (Signed)
Mobility Specialist - Progress Note ? ? 01/09/22 1600  ?Mobility  ?Activity Ambulated with assistance in hallway  ?Level of Assistance Standby assist, set-up cues, supervision of patient - no hands on  ?Assistive Device None  ?Distance Ambulated (ft) 600 ft  ?Activity Response Tolerated well  ?$Mobility charge 1 Mobility  ? ? ? ?Pt sitting in recliner upon arrival, utilizing RA. Pt ambulated in hallway with supervision, no LOB. Aox2 to self and location. Pt left in chair with sitter present.  ? ? ?[name] ?Mobility Specialist ?01/09/22, 4:49 PM ? ? ? ? ?

## 2022-01-10 DIAGNOSIS — I10 Essential (primary) hypertension: Secondary | ICD-10-CM | POA: Diagnosis not present

## 2022-01-10 DIAGNOSIS — E782 Mixed hyperlipidemia: Secondary | ICD-10-CM | POA: Diagnosis not present

## 2022-01-10 DIAGNOSIS — R509 Fever, unspecified: Secondary | ICD-10-CM | POA: Diagnosis not present

## 2022-01-10 DIAGNOSIS — F03918 Unspecified dementia, unspecified severity, with other behavioral disturbance: Secondary | ICD-10-CM | POA: Diagnosis not present

## 2022-01-10 NOTE — TOC Progression Note (Signed)
Transition of Care (TOC) - Progression Note  ? ? ?Patient Details  ?Name: Larry Buckley ?MRN: [id number] ?Date of Birth: 04/20/1941 ? ?Transition of Care (TOC) CM/SW Contact  ?Caryn Section, RN ?Phone Number: ?01/10/2022, 4:07 PM ? ?Clinical Narrative:   Continued medical work up due to fever, patient not discharged at this time. ? ? ? ?  ?  ? ?Expected Discharge Plan and Services ?  ?  ?  ?  ?  ?                ?  ?  ?  ?  ?  ?  ?  ?  ?  ?  ? ? ?Social Determinants of Health (SDOH) Interventions ?  ? ?Readmission Risk Interventions ?   ? View : No data to display.  ?  ?  ?  ? ? ?

## 2022-01-10 NOTE — Progress Notes (Signed)
Mobility Specialist - Progress Note ? ? 01/10/22 1745  ?Mobility  ?Activity Ambulated with assistance in hallway  ?Level of Assistance Standby assist, set-up cues, supervision of patient - no hands on  ?Assistive Device None  ?Distance Ambulated (ft) 160 ft  ?Activity Response Tolerated well  ?$Mobility charge 1 Mobility  ? ? ? ?Pt ambulated in hallway with supervision.  ? ? ?Larry Buckley ?Mobility Specialist ?01/10/22, 5:45 PM ? ? ? ? ?

## 2022-01-10 NOTE — Progress Notes (Signed)
Mobility Specialist - Progress Note ? ? 01/10/22 1700  ?Mobility  ?Activity Ambulated with assistance in hallway  ?Range of Motion/Exercises Active  ?Level of Assistance Standby assist, set-up cues, supervision of patient - no hands on  ?Assistive Device None  ?Distance Ambulated (ft) 160 ft  ?Activity Response Tolerated well  ?$Mobility charge 1 Mobility  ? ? ? ?Pt ambulated in hallway with supervision. Upon return to room, pt ambulated to sink to comb out hair. Pt left EOB with sitter present.  ? ? ?Larry Buckley ?Mobility Specialist ?01/10/22, 5:44 PM ? ? ? ? ?

## 2022-01-10 NOTE — TOC Progression Note (Signed)
Transition of Care (TOC) - Progression Note  ? ? ?Patient Details  ?Name: Betzalel Dohner ?MRN: [id number] ?Date of Birth: 1941/01/20 ? ?Transition of Care (TOC) CM/SW Contact  ?Pete Pelt, RN ?Phone Number: ?01/10/2022, 11:31 AM ? ?Clinical Narrative:   Patient's daughter viewed Memory Care facilities, but they are unable to accommodate patient until Medicaid is in effect for patient.  Medicaid application was sent last week and can take up to 90 days to become approved.  Patient is not eligible for emergency medicaid at this time and family cannot afford to self-pay for the gap in coverage. ? ?Daughter is hesitant to take patient home, and does not feel safe due to patient's wandering and combativeness at times.  She is the only caregiver and she does not feel safe, as she works and cannot provide 24/7 supervision.  She was planning to take patient to work with her, but does not feel this is an appropriate for patient to be in this setting. ? ?RNCM left message with daughter to further discuss as she may be considering guardianship. ? ? ? ?  ?  ? ?Expected Discharge Plan and Services ?  ?  ?  ?  ?  ?                ?  ?  ?  ?  ?  ?  ?  ?  ?  ?  ? ? ?Social Determinants of Health (SDOH) Interventions ?  ? ?Readmission Risk Interventions ?   ? View : No data to display.  ?  ?  ?  ? ? ?

## 2022-01-10 NOTE — Progress Notes (Signed)
[facility] Progress Note ? ?Patient: Larry Buckley    NKN:[id number]  DOA: 12/28/2021    ?Date of Service: the patient was seen and examined on 01/10/2022 ? ?Brief hospital course: ?Patient is an 81 year old male with past medical history of CVA, hypertension, AAA status postrepair and senile dementia brought into the emergency room on 3/15 for increasing agitation and confusion for the past week.  Initially CT of head suspicious for acute CVA, however MRI unremarkable.  Work-up felt to be worsening dementia.  Initial plan was for patient to go to skilled nursing unit with memory care.  However, facility unable to take patient due to financial arrangement issues.  Initial plan was for patient's daughter working to arrange care at home, on Monday, 3/27.  Over the course of 3/26, patient a bit more agitated, especially at night.  Ambulates freely in the hall without assistance.  Patient's daughter without good options and had plan to come pick up patient on 3/27 morning and take him to her work, before bringing him back to her house.  Case management able to step in and assisting patient daughter with looking for additional options and resources with plans.  1 option is the community hospice's dementia program.   ? ?On 3/28, patient noted to have a low-grade axillary temperature 100.7.  Pending discharge canceled and infection being worked up. ? ?Assessment and Plan: ?Assessment and Plan: ?Dementia with behavioral disturbance ?Continue Remeron and Seroquel with as needed Haldol.  Fall, aspiration and delirium precautions.  Psychiatry following.  Unfortunately, unable to go to memory care unit due to financial issues.    Patient usually calm although today, he is refusing labs and some medicines.  Intermittently agitated.  Case management assisting patient's daughter looking for additional resources. ? ?Fever ?Patient had low-grade temperature 100.7 on 3/28.  He has been resisted for further assessment of  his vitals today.  We will try to get lab work and urinalysis. ? ?Hyperlipidemia, mixed ?Continue atorvastatin ? ?Hypertension ?Blood pressure stable.  Continue amlodipine. ? ?History of abdominal aortic aneurysm (AAA) repair ?Continue aspirin and atorvastatin ? ?Prediabetes ?CBG stable.  A1c on admission at 6.3. ? ?Gout, arthropathy ?Not currently on any related meds and not acutely flared ? ? ? ? ? ? ?Body mass index is 24.91 kg/m?.  ?  ?   ? ?Consultants: ?Psychiatry ? ?Procedures: ?None ? ?Antimicrobials: ?None ? ?Code Status: Full code ? ? ?Subjective: More interactive, but a little more agitated. ? ?Objective: ?Noted borderline bradycardia ?Vitals:  ? 01/10/22 1130 01/10/22 1523  ?BP: 132/60 127/60  ?Pulse: 74 86  ?Resp:    ?Temp: 99.6 ?F (37.6 ?C) (!) 100.7 ?F (38.2 ?C)  ?SpO2: 95% 100%  ? ? ?Intake/Output Summary (Last 24 hours) at 01/10/2022 1816 ?Last data filed at 01/10/2022 1300 ?Gross per 24 hour  ?Intake 1200 ml  ?Output --  ?Net 1200 ml  ? ? ?Filed Weights  ? 12/28/21 2206 12/29/21 0258  ?Weight: 90 kg 88 kg  ? ?Body mass index is 24.91 kg/m?. ? ?Exam: ? ?General: Alert and oriented x1, no acute distress ?HEENT: Normocephalic, atraumatic, mucous membranes are moist ?Cardiovascular: Regular rate and rhythm, S1-S2 ?Respiratory: Clear to auscultation bilaterally ?Abdomen: Soft, nontender, nondistended, positive bowel sounds ?Musculoskeletal: No clubbing or cyanosis or edema ?Skin: No skin breaks, tears or lesions ?Psychiatry: Appropriate, no evidence of psychoses ?Neurology: No focal deficits ? ?Data Reviewed: ?There are no new results to review at this time. ? ?Disposition:  ?Status is: Inpatient ?  Remains inpatient appropriate because: Work-up of infection.  Anticipated discharge date is 3/30. ? ?Family Communication: Left message with daughter ?DVT Prophylaxis: ?enoxaparin (LOVENOX) injection 40 mg Start: 12/29/21 0800 ? ? ? ?Author: ?Hollice Espy ,MD ?01/10/2022 6:16 PM ? ?To reach On-call, see care  teams to locate the attending and reach out via [email or web address]. ?Between 7PM-7AM, please contact night-coverage ?If you still have difficulty reaching the attending provider, please page the [facility] (Director on Call) for [facility] on amion for assistance. ? ?

## 2022-01-10 NOTE — Progress Notes (Signed)
Occupational Therapy Treatment ?Patient Details ?Name: Larry Buckley ?MRN: [id number] ?DOB: [birth year] ?Today's Date: 01/10/2022 ? ? ?History of present illness 81 y.o. male with medical history significant for HTN, HLD, gout, dementia, AAA repair who was brought to the ED by his daughter due to abnormal behavior over the past 1 week out of keeping for his dementia. Imaging reveals possible chronic/subacute occipital ischemia changed from prior imaging over a year ago. ?  ?OT comments ? Pt seen for OT treatment on this date. Upon arrival to room, pt awake and seated upright in recliner with RN and 1:1 sitter present. Pt alert and oriented to self only. Pt presenting with decreased command follow and agitation during this session. This Pryor Curia attempted to engage pt in feeding and standing grooming tasks, however pt declining following significant encouragement and multi-modal cues. With encouragement, pt donned jacket while standing, requiring MIN A to don across back. Pt was able to walk to/from sink (57ft) without AD requiring MIN GUARD d/t decreased balance and impulsivity this date. Pt engaged in IADL task of managing linen; was able to place x1 blanket in bag while sitting. Pt left sitting upright in recliner, with RN and 1:1 sitter present. Pt continues to benefit from skilled OT services to maximize return to PLOF and minimize risk of future falls, injury, caregiver burden, and readmission. Will continue to follow POC. Discharge recommendation remains appropriate.    ? ?Recommendations for follow up therapy are one component of a multi-disciplinary discharge planning process, led by the attending physician.  Recommendations may be updated based on patient status, additional functional criteria and insurance authorization. ?   ?Follow Up Recommendations ? Outpatient OT  ?  ?Assistance Recommended at Discharge Intermittent Supervision/Assistance  ?Patient can return home with the following ? Assistance with  cooking/housework;Direct supervision/assist for medications management;Assist for transportation;Direct supervision/assist for financial management;A little help with bathing/dressing/bathroom ?  ?Equipment Recommendations ? None recommended by OT  ?  ?   ?Precautions / Restrictions Precautions ?Precautions: Fall ?Restrictions ?Weight Bearing Restrictions: No  ? ? ?  ? ?Mobility Bed Mobility ?  ?  ?  ?  ?  ?  ?  ?General bed mobility comments: pt seated in recliner pre/post session ?  ? ?Transfers ?Overall transfer level: Modified independent ?Equipment used: None ?  ?  ?  ?  ?  ?  ?  ?General transfer comment: Able to come into standing w/o assist ?  ?  ?Balance Overall balance assessment: Needs assistance ?  ?  ?  ?  ?Standing balance support: No upper extremity supported, During functional activity ?Standing balance-Leahy Scale: Fair ?Standing balance comment: Requires MIN GUARD for functional mobility ?  ?  ?  ?  ?  ?  ?  ?  ?  ?  ?  ?   ? ?ADL either performed or assessed with clinical judgement  ? ?ADL Overall ADL's : Needs assistance/impaired ?  ?  ?  ?  ?  ?  ?  ?  ?Upper Body Dressing : Minimal assistance;Standing ?Upper Body Dressing Details (indicate cue type and reason): to don jacket. Requires MIN A to don over back while standing ?  ?  ?  ?  ?  ?  ?  ?  ?Functional mobility during ADLs: Min guard ?  ?  ? ? ? ?Cognition Arousal/Alertness: Awake/alert ?Behavior During Therapy: [facility] for tasks assessed/performed ?Overall Cognitive Status: No family/caregiver present to determine baseline cognitive functioning ?  ?  ?  ?  ?  ?  ?  ?  ?  ?  ?  ?  ?  ?  ?  ?  ?  General Comments: Oriented to self only. Inconsistently following 1-step directions this date ?  ?  ?   ?Exercises   ? ?  ?   ?   ? ? ?Pertinent Vitals/ Pain       Pain Assessment ?Pain Assessment: No/denies pain ? ?   ?   ? ?Frequency ? Min 2X/week  ? ? ? ? ?  ?Progress Toward Goals ? ?OT Goals(current goals can now be found in the care plan section) ?  Progress towards OT goals: Progressing toward goals ? ?Acute Rehab OT Goals ?Patient Stated Goal: to go home ?OT Goal Formulation: With patient ?Time For Goal Achievement: 01/13/22 ?Potential to Achieve Goals: Good  ?Plan Discharge plan remains appropriate;Frequency remains appropriate   ? ?   ?AM-PAC OT "6 Clicks" Daily Activity     ?Outcome Measure ? ? Help from another person eating meals?: None ?Help from another person taking care of personal grooming?: A Little ?Help from another person toileting, which includes using toliet, bedpan, or urinal?: A Little ?Help from another person bathing (including washing, rinsing, drying)?: A Little ?Help from another person to put on and taking off regular upper body clothing?: A Little ?Help from another person to put on and taking off regular lower body clothing?: A Little ?6 Click Score: 19 ? ?  ?End of Session   ? ?OT Visit Diagnosis: Other abnormalities of gait and mobility (R26.89) ?  ?Activity Tolerance Treatment limited secondary to agitation ?  ?Patient Left in chair;with call bell/phone within reach;with nursing/sitter in room ?  ?Nurse Communication Mobility status ?  ? ?   ? ?Time: [id number] ?OT Time Calculation (min): 23 min ? ?Charges: OT General Charges ?$OT Visit: 1 Visit ?OT Treatments ?$Self Care/Home Management : 23-37 mins ? ?Fredirick Maudlin, OTR/L ?[place] ? ?

## 2022-01-10 NOTE — Assessment & Plan Note (Signed)
Patient had low-grade temperature 100.7 on 3/28.  He has been resisted for further assessment of his vitals today.  We will try to get lab work and urinalysis. ?

## 2022-01-11 ENCOUNTER — Telehealth: Payer: Self-pay | Admitting: Nurse Practitioner

## 2022-01-11 DIAGNOSIS — F03918 Unspecified dementia, unspecified severity, with other behavioral disturbance: Secondary | ICD-10-CM | POA: Diagnosis not present

## 2022-01-11 LAB — CBC
HCT: 38.9 % — ABNORMAL LOW (ref 39.0–52.0)
Hemoglobin: 12.7 g/dL — ABNORMAL LOW (ref 13.0–17.0)
MCH: 30.2 pg (ref 26.0–34.0)
MCHC: 32.6 g/dL (ref 30.0–36.0)
MCV: 92.4 fL (ref 80.0–100.0)
Platelets: 279 10*3/uL (ref 150–400)
RBC: 4.21 MIL/uL — ABNORMAL LOW (ref 4.22–5.81)
RDW: 13.2 % (ref 11.5–15.5)
WBC: 5.3 10*3/uL (ref 4.0–10.5)
nRBC: 0 % (ref 0.0–0.2)

## 2022-01-11 LAB — BASIC METABOLIC PANEL
Anion gap: 8 (ref 5–15)
BUN: 23 mg/dL (ref 8–23)
CO2: 26 mmol/L (ref 22–32)
Calcium: 8.7 mg/dL — ABNORMAL LOW (ref 8.9–10.3)
Chloride: 102 mmol/L (ref 98–111)
Creatinine, Ser: 0.89 mg/dL (ref 0.61–1.24)
GFR, Estimated: >60 mL/min (ref >60)
Glucose, Bld: 104 mg/dL — ABNORMAL HIGH (ref 70–99)
Potassium: 3.8 mmol/L (ref 3.5–5.1)
Sodium: 136 mmol/L (ref 135–145)

## 2022-01-11 MED ORDER — RISPERIDONE 0.5 MG PO TABS: 0.5000 mg | ORAL_TABLET | Freq: Two times a day (BID) | ORAL | 1 refills | Status: DC

## 2022-01-11 MED ORDER — MELATONIN 5 MG PO TABS: 2.5000 mg | ORAL_TABLET | Freq: Once | ORAL | 1 refills | Status: AC

## 2022-01-11 MED ORDER — DIVALPROEX SODIUM 125 MG PO CSDR: 125.0000 mg | DELAYED_RELEASE_CAPSULE | Freq: Two times a day (BID) | ORAL | 1 refills | Status: DC

## 2022-01-11 NOTE — Progress Notes (Signed)
Attempted to collect urine specimen multiple times myself and the Cna attempted. Pt unable to follow commands to urinate to get a clean catch and also to aggressive for in and out cath. MD aware ?

## 2022-01-11 NOTE — Telephone Encounter (Signed)
Pts is over 99ft tall and needs the equipment that goes over the toilet so he doesn't have to bend that low / he uses the toilet paper holders to hold on too and has broken them out of the wall / pt was discharged from hospital today and will need this asap/ please advise  ?

## 2022-01-11 NOTE — Discharge Summary (Signed)
Larry Buckley YWV:[id number] DOB: 10/19/1940 DOA: 12/28/2021 ? ?PCP: Pcp, No ? ?Admit date: 12/28/2021 ?Discharge date: 01/11/2022 ? ?Time spent: 40 minutes ? ?Recommendations for Outpatient Follow-up:  ?PCP f/u 1-2 weeks  ? ? ? ?Discharge Diagnoses:  ?Active Problems: ?  Hyperlipidemia, mixed ?  Hypertension ?  Gout, arthropathy ?  Fever ?  History of abdominal aortic aneurysm (AAA) repair ?  Dementia with behavioral disturbance ?  Prediabetes ? ? ?Discharge Condition: stable ? ?Diet recommendation: regular ? ?Filed Weights  ? 12/28/21 2206 12/29/21 0258  ?Weight: 90 kg 88 kg  ? ? ?History of present illness:  ?From admission h and p on 12/29/21: ?Larry Buckley is a 81 y.o. male with medical history significant for HTN, HLD, gout, dementia, AAA repair who was brought to the ED by his daughter due to abnormal behavior over the past 1 week out of keeping for his dementia.  According to report given, he crawled out of the bathroom window and was found by neighbors.  Patient denies complaints and does not know why he was brought to the hospital. ED course: BP 154/65 with otherwise normal vitals.  Labs unremarkable except for slightly elevated blood glucose of 126.  EKG, personally viewed and interpreted: NSR at 63 with nonspecific ST-T wave changes.  CT head concerning for low-attenuation in the right occipital lobe, new since prior study in August 2021 possibly acute ischemia.  Given symptom onset of several days, code stroke not initiated.  Hospitalist consulted for admission.  ? ?Hospital Course:  ?Dementia with behavioral disturbance ?Continue Remeron and Seroquel with as needed Haldol.  Fall, aspiration and delirium precautions.  Psychiatry following.  Unfortunately, unable to go to memory care unit due to financial issues.    Patient usually calm although today, he is refusing labs and some medicines.  Intermittently agitated.  Case management assisting patient's daughter looking for additional resources.  Will discharge home with daughter. ?  ?Fever ?Patient had low-grade temperature 100.7 on 3/28.  No focal symptoms. Did not cooperate for urinalysis. Blood cultures pending. Advise monitoring for fever recurrence and symptom monitoring. ?  ?Hyperlipidemia, mixed ?Continue atorvastatin ?  ?Hypertension ?Blood pressure stable.  Continue amlodipine. ?  ?History of abdominal aortic aneurysm (AAA) repair ?Continue aspirin and atorvastatin ?  ?Prediabetes ?CBG stable.  A1c on admission at 6.3. ?  ?Gout, arthropathy ?Not currently on any related meds and not acutely flared ?  ? ?Procedures: ?none  ? ?Consultations: ?psychiatry ? ?Discharge Exam: ?Vitals:  ? 01/11/22 8546 01/11/22 0723  ?BP: 117/60 (!) 126/56  ?Pulse: 65 (!) 55  ?Resp: 16 15  ?Temp: 98.8 ?F (37.1 ?C)   ?SpO2: 99% 100%  ? ?General: Alert and oriented x1, no acute distress ?HEENT: Normocephalic, atraumatic, mucous membranes are moist ?Cardiovascular: Regular rate and rhythm, S1-S2 ?Respiratory: Clear to auscultation bilaterally ?Abdomen: Soft, nontender, nondistended, positive bowel sounds ?Musculoskeletal: No clubbing or cyanosis or edema ?Skin: No skin breaks, tears or lesions ?Psychiatry: Appropriate, no evidence of psychoses ?Neurology: No focal deficits ?  ? ?Discharge Instructions ? ? ?Discharge Instructions   ? ? Diet - low sodium heart healthy   Complete by: As directed ?  ? Increase activity slowly   Complete by: As directed ?  ? ?  ? ?Allergies as of 01/11/2022   ? ?   Reactions  ? Lotensin [benazepril Hcl] Swelling  ? ?  ? ?  ?Medication List  ?  ? ?STOP taking these medications   ? ?QUEtiapine 50  MG tablet ?Commonly known as: SEROQUEL ?  ? ?  ? ?TAKE these medications   ? ?amLODipine 10 MG tablet ?Commonly known as: NORVASC ?Take 1 tablet (10 mg total) by mouth daily. ?  ?aspirin EC 81 MG tablet ?Take 1 tablet (81 mg total) by mouth daily. ?  ?atorvastatin 20 MG tablet ?Commonly known as: LIPITOR ?Take 1 tablet (20 mg total) by mouth daily. ?   ?divalproex 125 MG capsule ?Commonly known as: DEPAKOTE SPRINKLE ?Take 1 capsule (125 mg total) by mouth every 12 (twelve) hours. ?  ?melatonin 5 MG Tabs ?Take 0.5 tablets (2.5 mg total) by mouth once for 1 dose. ?  ?mirtazapine 15 MG tablet ?Commonly known as: REMERON ?Take 15 mg by mouth at bedtime. ?  ?risperiDONE 0.5 MG tablet ?Commonly known as: RISPERDAL ?Take 1 tablet (0.5 mg total) by mouth 2 (two) times daily. ?  ? ?  ? ?Allergies  ?Allergen Reactions  ? Lotensin [Benazepril Hcl] Swelling  ? ? ? ? ?The results of significant diagnostics from this hospitalization (including imaging, microbiology, ancillary and laboratory) are listed below for reference.   ? ?Significant Diagnostic Studies: ?CT Head Wo Contrast ? ?Result Date: 12/28/2021 ?CLINICAL DATA:  Altered mental status, nontraumatic. EXAM: CT HEAD WITHOUT CONTRAST TECHNIQUE: Contiguous axial images were obtained from the base of the skull through the vertex without intravenous contrast. RADIATION DOSE REDUCTION: This exam was performed according to the departmental dose-optimization program which includes automated exposure control, adjustment of the mA and/or kV according to patient size and/or use of iterative reconstruction technique. COMPARISON:  MRI brain 06/13/2020 FINDINGS: Brain: Diffuse cerebral atrophy. Ventricular dilatation consistent with central atrophy. Low-attenuation changes in the deep white matter consistent with small vessel ischemia. Small area of low-attenuation extending of the cortex in the right occipital lobe is new since prior MRI, possibly representing acute area of ischemia. No abnormal extra-axial fluid collections. No mass effect or midline shift. Gray-white matter junctions are distinct. Basal cisterns are not effaced. No acute intracranial hemorrhage. Vascular: Moderate intracranial arterial vascular calcifications. Skull: Calvarium appears intact. Sinuses/Orbits: Paranasal sinuses and mastoid air cells are clear.  Other: None. IMPRESSION: 1. Low-attenuation in the right occipital lobe is new since prior study, possibly acute ischemia. 2. No acute intracranial hemorrhage or mass effect. Chronic atrophy and small vessel ischemic changes. Electronically Signed   By: Burman Nieves M.D.   On: 12/28/2021 22:52  ? ?MR BRAIN WO CONTRAST ? ?Result Date: 12/29/2021 ?CLINICAL DATA:  Altered mental status, increased from baseline dementia, eval occipital stroke seen on same day CT EXAM: MRI HEAD WITHOUT CONTRAST TECHNIQUE: Multiplanar, multiecho pulse sequences of the brain and surrounding structures were obtained without intravenous contrast. COMPARISON:  August 2021; correlation made with CT head earlier same day FINDINGS: Brain: There is no acute infarction or intracranial hemorrhage. There is a small chronic right cortical/subcortical right occipital infarct corresponding to abnormality on CT. Additional patchy and confluent areas of T2 hyperintensity in the supratentorial white matter are nonspecific but may reflect moderate chronic microvascular ischemic changes similar to the prior study. Scattered foci of susceptibility particularly in the right occipital and temporal lobes are less apparent due to differences in technique. Prominence of the ventricles and sulci reflects similar parenchymal volume loss. There is no intracranial mass, mass effect, or edema. There is no hydrocephalus or extra-axial fluid collection. Vascular: Major vessel flow voids at the skull base are preserved. Skull and upper cervical spine: Normal marrow signal is preserved. Sinuses/Orbits: Paranasal sinuses are  aerated. Orbits are unremarkable. Other: Sella is unremarkable.  Mastoid air cells are clear. IMPRESSION: New but chronic small right occipital infarct. No acute infarction, hemorrhage, or mass. Chronic microvascular ischemic changes and parenchymal volume loss are similar to the prior study. Electronically Signed   By: Guadlupe SpanishPraneil  Patel M.D.   On:  12/29/2021 09:46  ? ?[facility] Carotid Bilateral [facility] and [facility]) ? ?Result Date: 12/29/2021 ?CLINICAL DATA:  81 year old male with history of stroke. EXAM: BILATERAL CAROTID DUPLEX ULTRASOUND TECHNIQUE: Wallace CullensGray scale ima

## 2022-01-11 NOTE — Care Management Important Message (Signed)
Important Message ? ?Patient Details  ?Name: Larry Buckley ?MRN: [id number] ?Date of Birth: Jun 06, 1941 ? ? ?Medicare Important Message Given:  Yes ? ?I reviewed the Important Message from Medicare with the patient's daughter by phone [phone] and she is in agreement with the discharge. I asked if she would like a copy and she declined as she had already received a copy in the mail.  I thanked her for her time. ? ? ?Olegario Messier A [name] ?01/11/2022, 12:27 PM ?

## 2022-01-11 NOTE — TOC Progression Note (Signed)
Transition of Care (TOC) - Progression Note  ? ? ?Patient Details  ?Name: Larry Buckley ?MRN: [id number] ?Date of Birth: 06/19/1941 ? ?Transition of Care (TOC) CM/SW Contact  ?Caryn Section, RN ?Phone Number: ?01/11/2022, 11:58 AM ? ?Clinical Narrative:   Patient can be discharged today per MD.  Daughter emphasizes she will have challenges caring for patient at home and does not feel safe.  This has been communicated to Care Staff and administration.  ? ?Daughter states she has not assistance at home and patient is aggressive and escapes.  She has a security system at home but cannot leave him alone.  Patient is no eligible for Home Health.  PCA resources given to daughter along with Memory Care facilities, Alzheimer's Association, dementia care alliance, Grants for Caregiving from [facility], and TEFL teacher given last week for patient to look into   Community hospice dementia program not able to assist and daughter has been experiencing declination from adult day cares due to aggressive behaviors.   ? ?Daughter is not able to place patient in memory care due to inability to pay until Medicaid is approved.  Patient is not candidate for SNF as he ambulates independently.   ? ? ? ?  ?  ? ?Expected Discharge Plan and Services ?  ?  ?  ?  ?  ?                ?  ?  ?  ?  ?  ?  ?  ?  ?  ?  ? ? ?Social Determinants of Health (SDOH) Interventions ?  ? ?Readmission Risk Interventions ?   ? View : No data to display.  ?  ?  ?  ? ? ?

## 2022-01-15 LAB — BLOOD CULTURE ID PANEL (REFLEXED) - BCID2

## 2022-01-16 ENCOUNTER — Ambulatory Visit (INDEPENDENT_AMBULATORY_CARE_PROVIDER_SITE_OTHER): Payer: Medicare PPO | Admitting: *Deleted

## 2022-01-16 DIAGNOSIS — E538 Deficiency of other specified B group vitamins: Secondary | ICD-10-CM

## 2022-01-16 DIAGNOSIS — F0280 Dementia in other diseases classified elsewhere without behavioral disturbance: Secondary | ICD-10-CM

## 2022-01-16 DIAGNOSIS — I10 Essential (primary) hypertension: Secondary | ICD-10-CM

## 2022-01-16 DIAGNOSIS — F03918 Unspecified dementia, unspecified severity, with other behavioral disturbance: Secondary | ICD-10-CM

## 2022-01-16 LAB — CULTURE, BLOOD (ROUTINE X 2)
Culture: NO GROWTH
Special Requests: ADEQUATE

## 2022-01-16 NOTE — Patient Instructions (Signed)
Visit Information ? ?Thank you for taking time to visit with me today. Please don't hesitate to contact me if I can be of assistance to you before our next scheduled telephone appointment. ? ?Following are the goals we discussed today:  ?Patient Goals/Self-Care Activities: Over the next 120 days ?Continue to pursue Medicaid ?Inquire with [employer]  (Online site) about benefits to include OTC catalog and [employer] ?Continue with compliance of taking medication  ?Increase healthy habits and continue utilizing coping skills ?Contact PCP office with any questions or concerns ?Attend all scheduled appts with providers ? ?Our next appointment is by telephone  (2-4 weeks) ? ?Please call the care guide team at [phone] if you need to cancel or reschedule your appointment.  ? ?If you are experiencing a Mental Health or Behavioral Health Crisis or need someone to talk to, please call 1-800-273-TALK (toll free, 24 hour hotline) ?call 911  ? ?The patient verbalized understanding of instructions, educational materials, and care plan provided today and declined offer to receive copy of patient instructions, educational materials, and care plan.  ? ?Reece Levy MSW, LCSW ?Licensed Clinical Social Worker ?CFP Coverage    ?[phone]  ?

## 2022-01-16 NOTE — Chronic Care Management (AMB) (Signed)
?Chronic Care Management  ? ? Clinical Social Work Note ? ?01/16/2022 ?Name: Larry Buckley MRN: [id number] DOB: 01/03/1941 ? ?Larry Buckley is a 81 y.o. year old male who is a primary care patient of Cannady, Larry Rank, NP. The CCM team was consulted to assist the patient with chronic disease management and/or care coordination needs related to: [employer] , Level of Care Concerns, and Caregiver Stress.  ? ?Engaged with patient by telephone for follow up visit in response to provider referral for social work chronic care management and care coordination services.  ? ?Consent to Services:  ?The patient was given information about Chronic Care Management services, agreed to services, and gave verbal consent prior to initiation of services.  Please see initial visit note for detailed documentation.  ? ?Patient agreed to services and consent obtained.  ? ?Assessment: Review of patient past medical history, allergies, medications, and health status, including review of relevant consultants reports was performed today as part of a comprehensive evaluation and provision of chronic care management and care coordination services.    ? ?SDOH (Social Determinants of Health) assessments and interventions performed:   ? ?Advanced Directives Status: Not addressed in this encounter. ? ?CCM Care Plan ? ?Allergies  ?Allergen Reactions  ? Lotensin [Benazepril Hcl] Swelling  ? ? ?Larry Encounter Medications as of 01/16/2022  ?Medication Sig  ? amLODipine (NORVASC) 10 MG tablet Take 1 tablet (10 mg total) by mouth daily.  ? aspirin EC 81 MG tablet Take 1 tablet (81 mg total) by mouth daily.  ? atorvastatin (LIPITOR) 20 MG tablet Take 1 tablet (20 mg total) by mouth daily.  ? divalproex (DEPAKOTE SPRINKLE) 125 MG capsule Take 1 capsule (125 mg total) by mouth every 12 (twelve) hours.  ? mirtazapine (REMERON) 15 MG tablet Take 15 mg by mouth at bedtime.  ? risperiDONE (RISPERDAL) 0.5 MG tablet Take 1 tablet (0.5 mg  total) by mouth 2 (two) times daily.  ? ?No facility-administered encounter medications on file as of 01/16/2022.  ? ? ?Patient Active Problem List  ? Diagnosis Date Noted  ? Prediabetes 01/04/2022  ? Dementia with behavioral disturbance (HCC) 12/29/2021  ? History of abdominal aortic aneurysm (AAA) repair 09/25/2021  ? Mixed dementia (HCC) 07/11/2021  ? B12 deficiency 07/11/2021  ? Fever 07/11/2021  ? Hyperlipidemia, mixed   ? Hypertension   ? Gout, arthropathy   ? ? ?Conditions to be addressed/monitored: Dementia; Limited social support and Level of care concerns ? ?Care Plan : LCSW Plan of Care  ?Updates made by Larry Mam, LCSW since 01/16/2022 12:00 AM  ?  ? ?Problem: Dementia Management and Support (General Plan of Care)   ?Priority: Medium  ?Onset Date: 07/22/2021  ?  ? ?Larry-Range Goal: Dementia Management   ?Start Date: 07/22/2021  ?Expected End Date: 04/14/2022  ?This Visit's Progress: On track  ?Recent Progress: On track  ?Priority: Medium  ?Note:   ?Current barriers:   ? Memory Deficits ?Clinical Goals: Patient will work with CCM LCSW to address needs related to management of dementia ?Clinical Interventions:  ?01/16/22- This CSW covering today outreached pt's stepdaughter, Larry Buckley, for scheduled outreach call. Larry Buckley confirmed pt's identity. Pt was recently released from [facility] and is in her care- she is working towards hopeful Medicaid in order to seek placement for pt. CSW suggested she make Larry Buckley aware of recent hospitalization/outstanding medical bills to be  used  in processing of application.  CSW made Larry Buckley aware of the Dining program through Larry Buckley  after hospitalizations- will have Care Guide follow up with her for arranging. Larry Buckley inquiring about getting an elevated toilet seat type DME for pt- CSW suggested she see if the Larry [facility]ley OTC offers this- provided steps to accessing Larry Buckley. She may be able to get PCP to order a 3in1 if covered by insurance- PCP visit planned for later this  month. ? ?Assessment of needs and barriers to care ?All of the hx was provided by patient's spouse, Larry Buckley ?Patient can stay home independently while spouse completes medical treatments and/or appointments. He can stay at home. Family receives strong support from adult daughters and neighbors 12/16: Patient's spouse is currently hospitalized resulting in pt being home alone for majority of the day. Pt's step-daughter, Larry Buckley, has been assisting in his care needs. Larry Buckley spoke with Larry Buckley and followed up with Larry Buckley, Pallative Care LCSW. She has picked up pt's FL2 from PCP office. Larry Buckley plans on taking patient to visit spouse today. She will contact pt's adult sons to discuss pt's healthcare needs this weekend, including her interest in becoming pt's POA/Guardian. CCM LCSW provided Larry Buckley with information regarding Larry [facility] via email 12/23: Larry Buckley received approval from eldest Buckley to become pt's POA; however, youngest Buckley has disrupted care by reminding pt of not having his vehicle, which in turn resulted in patient becoming hyper-focused on driving. Larry Buckley plans on dropping patient to his oldest Buckley's residence, to provide care for the time being 3/20: Pt's daughter in law reports inability to care for patient due to decline in cognitive functioning and hx of combativeness. States that she can not keep him safe in the home. CCM LCSW strongly encouraged placement to memory care, which [facility] staff is assisting her with. She has obtained financial reports to submit to Larry Buckley and Larry Buckley to apply for Special Assistance Medicaid for pt ?Patient completes ADL's independently. Denies appetite or sleep concerns ?Patient is not experiencing depression or anxiety symptoms. Spouse and patient are not in need of therapy or supportive resources, at this time ?Spouse reports that they no longer allow patient to drive a vehicle. He does not have access to keys and is reported to have a calm temperament majority of the  time 12/22: Larry Buckley utilizes home monitoring system to keep tabs on patient, while she is away from the home ?CCM LCSW reviewed upcoming appointments ?CCM LCSW collaborated with CCM RN and PCP regarding patient needs and the additional support family is receiving through Palliative Care ?Depression screen reviewed  ?Active listening / Reflection utilized  ?Emotional Support Provided ?Provided psychoeducation for mental health needs  ?Quality of sleep assessed & Sleep Hygiene techniques promoted  ?Verbalization of feelings encouraged  ?Review various resources, discussed options and provided patient information about  ?Dementia resources and support  ?1:1 collaboration with primary care provider regarding development and update of comprehensive plan of care as evidenced by provider attestation and co-signature ?Inter-disciplinary care team collaboration (see longitudinal plan of care) ?Patient Goals/Self-Care Activities: Over the next 120 days ?Continue with compliance of taking medication  ?Increase healthy habits and continue utilizing coping skills ?Contact PCP office with any questions or concerns ?Attend all scheduled appts with providers ? ? ? ?  ?  ? ?Follow Up Plan: SW will follow up with patient by phone over the next 30 days ?     ?Reece Levy MSW, LCSW ?Licensed Clinical Social Worker ?CFP Coverage    ?[phone]  ? ? ?

## 2022-01-17 ENCOUNTER — Other Ambulatory Visit: Payer: Self-pay

## 2022-01-17 NOTE — Telephone Encounter (Signed)
Prescription and requested paperwork was faxed over to [facility] at [phone].  ?

## 2022-01-17 NOTE — Telephone Encounter (Signed)
Patient's daughter returned my call. She states that they would like to have a and elevated toilet seat with rails to help the patient when getting off of the commode. She states that the patient is so tall that when he tries to stand up, he is putting his weight on the toilet paper holder to stand due to being lower to the ground. States that the patient is ripping these out of the wall when he does this. Daughter states that she would like order faxed to [facility] once signed by Henrine Screws.  ? ?Order written and placed in providers folder for signature.  ?

## 2022-01-17 NOTE — Telephone Encounter (Signed)
Called and LVM asking for Larry Buckley to please return my call to get more information about what is needed.  ?

## 2022-01-18 LAB — CULTURE, BLOOD (ROUTINE X 2): Special Requests: ADEQUATE

## 2022-01-25 ENCOUNTER — Other Ambulatory Visit: Payer: Self-pay | Admitting: Nurse Practitioner

## 2022-01-25 NOTE — Telephone Encounter (Signed)
Medication Refill - Medication:90 day supply of risperiDONE (RISPERDAL) 0.5 MG tablet. Caller states patient has 1 pill and forgot to mention at last office visit on 01/16/2022 ? ?Has the patient contacted their pharmacy? Yes  ? ?(Agent: If yes, when and what did the pharmacy advise?) Contact PCP and request a PA for a 90 day supply ? ?Preferred Pharmacy (with phone number or street name):  ?[id number] [name], [place] - [address] Phone:  [phone]  ?Fax:  [phone]  ?  ? ? ?Has the patient been seen for an appointment in the last year OR does the patient have an upcoming appointment? Yes.   ? ?Agent: Please be advised that RX refills may take up to 3 business days. We ask that you follow-up with your pharmacy. ?

## 2022-01-25 NOTE — Telephone Encounter (Signed)
Requested medication (s) are due for refill today: no ? ?Requested medication (s) are on the active medication list: yes, from [place] admission ? ?Last refill:  01/11/22 #180 with 1 RF ? ?Future visit scheduled: no, last seen 09/27/21 ? ?Notes to clinic:  Has refills, not sure why it is requested, ordered from hospital admission, please assess. ? ? ?  ? ?Requested Prescriptions  ?Pending Prescriptions Disp Refills  ? risperiDONE (RISPERDAL) 0.5 MG tablet 180 tablet 1  ?  Sig: Take 1 tablet (0.5 mg total) by mouth 2 (two) times daily.  ?  ? Not Delegated - Psychiatry:  Antipsychotics - Second Generation (Atypical) - risperidone Failed - 01/25/2022  8:34 AM  ?  ?  Failed - This refill cannot be delegated  ?  ?  Failed - Lipid Panel in normal range within the last 12 months  ?  Cholesterol, Total  ?Date Value Ref Range Status  ?09/27/2021 163 100 - 199 mg/dL Final  ? ?Cholesterol  ?Date Value Ref Range Status  ?12/29/2021 131 0 - 200 mg/dL Final  ? ?LDL Chol Calc (NIH)  ?Date Value Ref Range Status  ?09/27/2021 100 (H) 0 - 99 mg/dL Final  ? ?LDL Cholesterol  ?Date Value Ref Range Status  ?12/29/2021 73 0 - 99 mg/dL Final  ?  Comment:  ?         ?Total Cholesterol/HDL:CHD Risk ?Coronary Heart Disease Risk Table ?                    Men   Women ? 1/2 Average Risk   3.4   3.3 ? Average Risk       5.0   4.4 ? 2 X Average Risk   9.6   7.1 ? 3 X Average Risk  23.4   11.0 ?       ?Use the calculated Patient Ratio ?above and the CHD Risk Table ?to determine the patient's CHD Risk. ?       ?ATP III CLASSIFICATION (LDL): ? <100     mg/dL   Optimal ? 100-129  mg/dL   Near or Above ?                   Optimal ? 130-159  mg/dL   Borderline ? 160-189  mg/dL   High ? >190     mg/dL   Very High ?Performed at [facility], [address] [place], [place] [address] ?  ? ?HDL  ?Date Value Ref Range Status  ?12/29/2021 49 >40 mg/dL Final  ?09/27/2021 53 >39 mg/dL Final  ? ?Triglycerides  ?Date Value Ref Range Status  ?12/29/2021 44  <150 mg/dL Final  ? ?  ?  ?  Passed - TSH in normal range and within 360 days  ?  TSH  ?Date Value Ref Range Status  ?07/11/2021 1.460 0.450 - 4.500 uIU/mL Final  ?  ?  ?  ?  Passed - Completed PHQ-2 or PHQ-9 in the last 360 days  ?  ?  Passed - Last BP in normal range  ?  BP Readings from Last 1 Encounters:  ?01/11/22 (!) 126/56  ?  ?  ?  ?  Passed - Last Heart Rate in normal range  ?  Pulse Readings from Last 1 Encounters:  ?01/11/22 (!) 55  ?  ?  ?  ?  Passed - Valid encounter within last 6 months  ?  Recent Outpatient Visits   ? ?      ?  4 months ago Mixed dementia ([place])  ? [place], Henrine Screws T, NP  ? 6 months ago Encounter to establish care  ? [facility] [place], Parkman T, NP  ? ?  ?  ? ?  ?  ?  Passed - CBC within normal limits and completed in the last 12 months  ?  WBC  ?Date Value Ref Range Status  ?01/11/2022 5.3 4.0 - 10.5 K/uL Final  ? ?RBC  ?Date Value Ref Range Status  ?01/11/2022 4.21 (L) 4.22 - 5.81 MIL/uL Final  ? ?Hemoglobin  ?Date Value Ref Range Status  ?01/11/2022 12.7 (L) 13.0 - 17.0 g/dL Final  ?07/11/2021 15.7 13.0 - 17.7 g/dL Final  ? ?HCT  ?Date Value Ref Range Status  ?01/11/2022 38.9 (L) 39.0 - 52.0 % Final  ? ?Hematocrit  ?Date Value Ref Range Status  ?07/11/2021 47.1 37.5 - 51.0 % Final  ? ?MCHC  ?Date Value Ref Range Status  ?01/11/2022 32.6 30.0 - 36.0 g/dL Final  ? ?MCH  ?Date Value Ref Range Status  ?01/11/2022 30.2 26.0 - 34.0 pg Final  ? ?MCV  ?Date Value Ref Range Status  ?01/11/2022 92.4 80.0 - 100.0 fL Final  ?07/11/2021 90 79 - 97 fL Final  ? ?No results found for: PLTCOUNTKUC, LABPLAT, [place] ?RDW  ?Date Value Ref Range Status  ?01/11/2022 13.2 11.5 - 15.5 % Final  ?07/11/2021 12.9 11.6 - 15.4 % Final  ? ?  ?  ?  Passed - CMP within normal limits and completed in the last 12 months  ?  Albumin  ?Date Value Ref Range Status  ?12/28/2021 3.5 3.5 - 5.0 g/dL Final  ?09/27/2021 4.2 3.6 - 4.6 g/dL Final  ? ?Alkaline Phosphatase  ?Date Value Ref  Range Status  ?12/28/2021 60 38 - 126 U/L Final  ? ?ALT  ?Date Value Ref Range Status  ?12/28/2021 14 0 - 44 U/L Final  ? ?AST  ?Date Value Ref Range Status  ?12/28/2021 18 15 - 41 U/L Final  ? ?BUN  ?Date Value Ref Range Status  ?01/11/2022 23 8 - 23 mg/dL Final  ?09/27/2021 12 8 - 27 mg/dL Final  ? ?Calcium  ?Date Value Ref Range Status  ?01/11/2022 8.7 (L) 8.9 - 10.3 mg/dL Final  ? ?CO2  ?Date Value Ref Range Status  ?01/11/2022 26 22 - 32 mmol/L Final  ? ?Bicarbonate  ?Date Value Ref Range Status  ?02/09/2016 19.4 (L) 21.0 - 28.0 mEq/L Final  ? ?Creatinine, Ser  ?Date Value Ref Range Status  ?01/11/2022 0.89 0.61 - 1.24 mg/dL Final  ? ?Glucose, Bld  ?Date Value Ref Range Status  ?01/11/2022 104 (H) 70 - 99 mg/dL Final  ?  Comment:  ?  Glucose reference range applies only to samples taken after fasting for at least 8 hours.  ? ?Glucose-Capillary  ?Date Value Ref Range Status  ?12/29/2021 133 (H) 70 - 99 mg/dL Final  ?  Comment:  ?  Glucose reference range applies only to samples taken after fasting for at least 8 hours.  ? ?Potassium  ?Date Value Ref Range Status  ?01/11/2022 3.8 3.5 - 5.1 mmol/L Final  ? ?Sodium  ?Date Value Ref Range Status  ?01/11/2022 136 135 - 145 mmol/L Final  ?09/27/2021 140 134 - 144 mmol/L Final  ? ?Total Bilirubin  ?Date Value Ref Range Status  ?12/28/2021 0.3 0.3 - 1.2 mg/dL Final  ? ?Bilirubin Total  ?Date Value Ref Range Status  ?09/27/2021 0.7 0.0 - 1.2  mg/dL Final  ? ?Protein, ur  ?Date Value Ref Range Status  ?12/29/2021 NEGATIVE NEGATIVE mg/dL Final  ? ?Total Protein  ?Date Value Ref Range Status  ?12/28/2021 7.7 6.5 - 8.1 g/dL Final  ?09/27/2021 6.9 6.0 - 8.5 g/dL Final  ? ?GFR calc Af Amer  ?Date Value Ref Range Status  ?02/29/2016 >60 >60 mL/min Final  ?  Comment:  ?  (NOTE) ?The eGFR has been calculated using the CKD EPI equation. ?This calculation has not been validated in all clinical situations. ?eGFR's persistently <60 mL/min signify possible Chronic Kidney ?Disease. ?   ? ?eGFR  ?Date Value Ref Range Status  ?09/27/2021 80 >59 mL/min/1.73 Final  ? ?GFR, Estimated  ?Date Value Ref Range Status  ?01/11/2022 >60 >60 mL/min Final  ?  Comment:  ?  (NOTE) ?Calculated using the CKD-EPI Creatinine Equation (2021) ?  ? ?  ?  ?  ? ? ?

## 2022-01-26 ENCOUNTER — Encounter: Payer: Self-pay | Admitting: Nurse Practitioner

## 2022-01-26 ENCOUNTER — Other Ambulatory Visit: Payer: Medicare PPO | Admitting: Nurse Practitioner

## 2022-01-26 DIAGNOSIS — Z515 Encounter for palliative care: Secondary | ICD-10-CM

## 2022-01-26 DIAGNOSIS — F03918 Unspecified dementia, unspecified severity, with other behavioral disturbance: Secondary | ICD-10-CM

## 2022-01-26 DIAGNOSIS — H1033 Unspecified acute conjunctivitis, bilateral: Secondary | ICD-10-CM

## 2022-01-26 DIAGNOSIS — R600 Localized edema: Secondary | ICD-10-CM

## 2022-01-26 DIAGNOSIS — R630 Anorexia: Secondary | ICD-10-CM

## 2022-01-26 NOTE — Progress Notes (Signed)
? ? ?Manufacturing engineer ?Community Palliative Care Consult Note ?Telephone: [phone]  ?Fax: [phone]  ? ? ?Date of encounter: 01/26/22 ?2:48 PM ?PATIENT NAME: Larry Buckley ?[place] [address]   ?[phone] (home)  ?DOB: 08/31/1941 ?MRN: [id number] ?PRIMARY CARE PROVIDER:    ?Venita Lick, NP,  ?[address] [place] [address] ?[phone] ?RESPONSIBLE PARTY:    ?Contact Information   ? ? Name Relation Home Work Mobile  ? LEA,LISA Daughter  [phone]  ? CREWS,DANIELLE Daughter [phone]  [phone]  ? ?  ? ?I met face to face with patient and family in home. Palliative Care was asked to follow this patient by consultation request of  Marnee Guarneri NP to address advance care planning and complex medical decision making. This is a follow up visit.                                  ?ASSESSMENT AND PLAN / RECOMMENDATIONS:  ?Symptom Management/Plan: ?1. Advance Care Planning; Full code  ?  ?2. Anorexia secondary to dementia, improving gaining weight since Lattie Haw have been making meals. Does not appear Larry Buckley has lost more weight.  ?09/27/2021 weight 179.3 lbs ?12/28/2021 weight 194 lbs ?14.7 lbs gain ?  ?3. Memory impairment secondary to dementia. We talked about disease progression. We talked about concern of Larry Buckley driving, getting into cars with strangers, wandering behaviors, climbing out the window and wandering off, going to strangers homes. We talked about safety components and resistant to care. We talked about recent hospitalization. We talked about concern for Larry Buckley not consistently taking his medications including risperidone. Lattie Haw endorses she gave him his last pill yesterday, waiting for #90 day refill. Will refill 2 weeks worth until #90 days can be sent in by primary provider, notified. We talked about behaviors, resistance to care including bathing, incontinence episodes. We talked about [place], current guardianship and  challenges Lattie Haw is currently facing. We talked about Social services working with Lattie Haw to complete application though limited services. We talked about option of involving APS, what their role could be to help with guardianship, placement since concerns for safety is at hand as she feels she is unable to keep him safe. Lattie Haw endorses Larry Buckley moved her car a few days ago in the street. Lattie Haw endorses she will go ahead and call APS, left a message for assistance. Therapeutic listening, emotional support provided. We talked about chronic disease progression of Dementia with realistic expectation with increase in skill level Larry Buckley is requiring. Questions answered. ? ?Rx: risperidone 0.3m bid x 14 days; #28; No RF escribed  ?  ?4. Conjunctivitis bilateral; discussed with LLattie Hawcoarse of treatment, reviewed with Larry Buckley limited and at time of visit agreeable to using gtt's. Will send in Polytrim ? ?Rx: Polytrim 1gtt each eye q6 hts bilateral eyes x 7 days; #1bottle, No RF ? ?5. Edema BLE secondary to HTN on norvasc but has been on for a long time, will need to continue to monitor; discussed dependent edema as LLattie Hawendorses Larry EIranihas been sleeping in a recliner with his feet down or the couch, not the bed. We talked about compression hose, primary will order and Larry. ELatneris currently agreeable to trying to wear them. We talked about 3 day dose of lasix, Lisa in agreement with edema +3.  ? ?Rx: Lasix 274mpo qd x 3 days; #  3; No RF ?  ?6. Palliative care encounter; Palliative care encounter; Palliative medicine team will continue to support patient, patient's family, and medical team. Visit consisted of counseling and education dealing with the complex and emotionally intense issues of symptom management and palliative care in the setting of serious and potentially life-threatening illness ?  ?7. f/u 2 weeks for f/u edema; conjunctivitis or sooner if worsens then for ongoing monitoring chronic disease  progression, ongoing discussions complex medical decision making ? ?Follow up Palliative Care Visit: Palliative care will continue to follow for complex medical decision making, advance care planning, and clarification of goals. Return 2 weeks or prn. ? ?I spent 65 minutes providing this consultation. More than 50% of the time in this consultation was spent in counseling and care coordination. ?PPS: 50% ? ?Chief Complaint: Follow up palliative consult for complex medical decision making ? ?HISTORY OF PRESENT ILLNESS:  Larry Buckley is a 81 y.o. year old male  with with multiple medial problems including Late onset moderate alzheimer's dementia with Lewy body component in setting of amyloid angiopathy- history of delusions of others talking in house, feeling of other people in home, ruptured AAA repair in 2017, HTN, bilateral cataract, irregular, irregular rhythm. I called Mrs. Mayeux daughter, Lattie Haw to confirm in home pc follow up consult. Lattie Haw was in agreement, covid screening negative. I visited Larry Buckley he was standing in the driveway when pulled up. He did go in the house with Lattie Haw his step daughter. Larry Buckley did sit down on the couch, did not make eye contact but did answer questions, cooperative with assessment. Recent hospitalization from 12/28/2021 to 01/11/2022 for abnormal behaviors, climbing out the bathroom window, found by neighbors. Dementia with behavior disturbances with low grade temperature, no focal symptoms, unable to get UA.  ? ?History obtained from review of EMR, discussion with Lattie Haw Larry Buckley.  ?I reviewed available labs, medications, imaging, studies and related documents from the EMR.  Records reviewed and summarized above.  ? ?ROS ?10 point system reviewed with Lattie Haw, Larry Buckley with Larry Buckley as he is cognitively impaired ? ?Physical Exam: ?Constitutional: NAD ?General: pleasantly confused male ?EYES: anicteric sclera bilaterally injected, lids  intact ?ENMT: oral mucous membranes moist ?CV: S1S2, RRR, 3+BLE edema ?Pulmonary: LCTA, no increased work of breathing, no cough, room air ?Abdomen: intake 100%, soft and non tender ?MSK: ambulatory ?Skin: warm and dry ?Neuro:  no generalized weakness,  + cognitive impairment ?Psych: flat affect, A and Oriented to self ?Thank you for the opportunity to participate in the care of Larry. Mckim.  The palliative care team will continue to follow. Please call our office at [phone] if we can be of additional assistance.  ? ?Woodrow Dulski Z Kartel Wolbert, NP  ? ?COVID-19 PATIENT SCREENING TOOL ?Asked and negative response unless otherwise noted:  ? ?Have you had symptoms of covid, tested positive or been in contact with someone with symptoms/positive test in the past 5-10 days? NO ? ?

## 2022-01-27 ENCOUNTER — Encounter: Payer: Self-pay | Admitting: Nurse Practitioner

## 2022-01-27 DIAGNOSIS — R600 Localized edema: Secondary | ICD-10-CM | POA: Insufficient documentation

## 2022-02-09 ENCOUNTER — Encounter: Payer: Self-pay | Admitting: Nurse Practitioner

## 2022-02-09 ENCOUNTER — Other Ambulatory Visit: Payer: Medicare PPO | Admitting: Nurse Practitioner

## 2022-02-09 ENCOUNTER — Other Ambulatory Visit: Payer: Self-pay

## 2022-02-09 ENCOUNTER — Emergency Department
Admission: EM | Admit: 2022-02-09 | Discharge: 2022-03-15 | Disposition: A | Discharge status: Skilled Nursing Facility | Payer: Medicare PPO | Attending: Emergency Medicine | Admitting: Emergency Medicine

## 2022-02-09 DIAGNOSIS — Z20822 Contact with and (suspected) exposure to covid-19: Secondary | ICD-10-CM | POA: Insufficient documentation

## 2022-02-09 DIAGNOSIS — I10 Essential (primary) hypertension: Secondary | ICD-10-CM | POA: Insufficient documentation

## 2022-02-09 DIAGNOSIS — F0390 Unspecified dementia without behavioral disturbance: Secondary | ICD-10-CM | POA: Diagnosis not present

## 2022-02-09 DIAGNOSIS — R7303 Prediabetes: Secondary | ICD-10-CM | POA: Insufficient documentation

## 2022-02-09 DIAGNOSIS — R4182 Altered mental status, unspecified: Secondary | ICD-10-CM | POA: Diagnosis present

## 2022-02-09 DIAGNOSIS — R600 Localized edema: Secondary | ICD-10-CM

## 2022-02-09 DIAGNOSIS — Z515 Encounter for palliative care: Secondary | ICD-10-CM

## 2022-02-09 DIAGNOSIS — Z79899 Other long term (current) drug therapy: Secondary | ICD-10-CM | POA: Insufficient documentation

## 2022-02-09 DIAGNOSIS — F03918 Unspecified dementia, unspecified severity, with other behavioral disturbance: Secondary | ICD-10-CM

## 2022-02-09 DIAGNOSIS — F03911 Unspecified dementia, unspecified severity, with agitation: Secondary | ICD-10-CM | POA: Diagnosis present

## 2022-02-09 DIAGNOSIS — E782 Mixed hyperlipidemia: Secondary | ICD-10-CM | POA: Diagnosis present

## 2022-02-09 DIAGNOSIS — R410 Disorientation, unspecified: Secondary | ICD-10-CM | POA: Insufficient documentation

## 2022-02-09 DIAGNOSIS — F0280 Dementia in other diseases classified elsewhere without behavioral disturbance: Secondary | ICD-10-CM | POA: Diagnosis present

## 2022-02-09 DIAGNOSIS — F0150 Vascular dementia without behavioral disturbance: Secondary | ICD-10-CM | POA: Diagnosis present

## 2022-02-09 LAB — CBC
HCT: 38.9 % — ABNORMAL LOW (ref 39.0–52.0)
Hemoglobin: 12.4 g/dL — ABNORMAL LOW (ref 13.0–17.0)
MCH: 29.7 pg (ref 26.0–34.0)
MCHC: 31.9 g/dL (ref 30.0–36.0)
MCV: 93.1 fL (ref 80.0–100.0)
Platelets: 353 10*3/uL (ref 150–400)
RBC: 4.18 MIL/uL — ABNORMAL LOW (ref 4.22–5.81)
RDW: 13.2 % (ref 11.5–15.5)
WBC: 4.8 10*3/uL (ref 4.0–10.5)
nRBC: 0 % (ref 0.0–0.2)

## 2022-02-09 LAB — COMPREHENSIVE METABOLIC PANEL
ALT: 15 U/L (ref 0–44)
AST: 17 U/L (ref 15–41)
Albumin: 3.5 g/dL (ref 3.5–5.0)
Alkaline Phosphatase: 70 U/L (ref 38–126)
Anion gap: 5 (ref 5–15)
BUN: 19 mg/dL (ref 8–23)
CO2: 29 mmol/L (ref 22–32)
Calcium: 9.6 mg/dL (ref 8.9–10.3)
Chloride: 104 mmol/L (ref 98–111)
Creatinine, Ser: 1.1 mg/dL (ref 0.61–1.24)
GFR, Estimated: >60 mL/min (ref >60)
Glucose, Bld: 120 mg/dL — ABNORMAL HIGH (ref 70–99)
Potassium: 4.3 mmol/L (ref 3.5–5.1)
Sodium: 138 mmol/L (ref 135–145)
Total Bilirubin: 0.5 mg/dL (ref 0.3–1.2)
Total Protein: 8 g/dL (ref 6.5–8.1)

## 2022-02-09 LAB — ETHANOL: Alcohol, Ethyl (B): <10 mg/dL (ref <10)

## 2022-02-09 LAB — ACETAMINOPHEN LEVEL: Acetaminophen (Tylenol), Serum: <10 ug/mL — ABNORMAL LOW (ref 10–30)

## 2022-02-09 LAB — SALICYLATE LEVEL: Salicylate Lvl: <7 mg/dL — ABNORMAL LOW (ref 7.0–30.0)

## 2022-02-09 NOTE — ED Notes (Signed)
Patient transferred from Triage to room 20 after dressing out and screening for contraband. Report received from [place], [place] including situation, background, assessment and recommendations. Pt oriented to [employer] including Q15 minute rounds as well as Psychologist, counselling for their protection. Patient is alert and oriented, warm and dry in no acute distress. Patient denies SI, HI, and AVH. Pt. Encouraged to let this nurse know if needs arise.  ?

## 2022-02-09 NOTE — ED Notes (Signed)
On assessment, pt does not understand why he is here and feels that his family wanted some tests ran. States he feels that he is not going to be here too long. ? ?Speaking with the [employer] officer who brought pt to ED, officer reports that pts daughter-in-law took out IVC papers. States that there is currently an open case with DSS and goal of long term care for pt. Pt currently believes his wife is alive, who is deceased, and his adult children are young. Pt has history of dementia and Alzheimer and lives at home. Will wander off from home as he lives alone. ? ?Reviewing pt chart, he was seen by NP with Palliative care today. Appears that the visit was positive. ? ?Pt provided with a warm blanket at this time ?

## 2022-02-09 NOTE — Progress Notes (Signed)
? ? ?Manufacturing engineer ?Community Palliative Care Consult Note ?Telephone: [phone]  ?Fax: [phone]  ? ? ?Date of encounter: 02/09/22 ?1:43 PM ?PATIENT NAME: Larry Buckley ?[address] ?[place] [address]   ?[phone] (home)  ?DOB: 04-07-1941 ?MRN: [id number] ?PRIMARY CARE PROVIDER:    ?Venita Lick, NP,  ?[address] [place] [address] ?[phone] ? ?RESPONSIBLE PARTY:    ?Contact Information   ? ? Name Relation Home Work Mobile  ? LEA,LISA Daughter  [phone]  ? CREWS,DANIELLE Daughter [phone]  [phone]  ? ?  ? ?I met face to face with patient and family in home. Palliative Care was asked to follow this patient by consultation request of  Venita Lick, NP to address advance care planning and complex medical decision making. This is a follow up visit.                                  ?ASSESSMENT AND PLAN / RECOMMENDATIONS:  ?Symptom Management/Plan: ?1. Advance Care Planning; Full code  ?  ?2. Anorexia secondary to dementia, improving gaining weight since Lattie Haw have been making meals. Does not appear Mr. Hartshorne has lost more weight.  ?12/28/2021 weight 194 lbs ?  ?3. Memory impairment secondary to dementia. We talked about disease progression. Lattie Haw talked about her working with APS for guardianship; placement. He has been more compliant with medications, taking risperidone improved with behaviors. We talked about option of remote health coming into home to do primary care. Lattie Haw endorses she would like to try that, will send referral over, notify Marnee Guarneri NP. ?  ?4. Conjunctivitis bilateral; resolved  ?  ?5. Edema BLE secondary to HTN on norvasc but has been on for a long time, will need to continue to monitor; discussed dependent edema as Lattie Haw endorses Mr Baranowski has been sleeping more in the bed with his legs elevated which helps  ?  ?6. Palliative care encounter; Palliative care encounter; Palliative medicine team will continue to support  patient, patient's family, and medical team. Visit consisted of counseling and education dealing with the complex and emotionally intense issues of symptom management and palliative care in the setting of serious and potentially life-threatening illness ?  ?7. f/u ongoing monitoring chronic disease progression, ongoing discussions complex medical decision making ? ?Follow up Palliative Care Visit: Palliative care will continue to follow for complex medical decision making, advance care planning, and clarification of goals. Return 8 weeks or prn. ? ?I spent 46 minutes providing this consultation. More than 50% of the time in this consultation was spent in counseling and care coordination. ?PPS: 50% ?Chief Complaint: Follow up palliative consult for complex medical decision making ? ?HISTORY OF PRESENT ILLNESS:  Larry Buckley is a 81 y.o. year old male  with multiple medial problems including Late onset moderate alzheimer's dementia with Lewy body component in setting of amyloid angiopathy- history of delusions of others talking in house, feeling of other people in home, ruptured AAA repair in 2017, HTN, bilateral cataract, irregular, irregular rhythm. I called Mrs. Jessop daughter, Lattie Haw to confirm in home pc follow up consult. Lattie Haw was in agreement, covid screening negative. I visited Mr Preusser he was standing in the doorway, we talked about updates, no further changes, ros, symptoms. Lattie Haw endorses Mr. Freel has been more complaint about sleeping in the bed and taking his medications. Mr. Posthumus has been more cooperative. We talked about  medical goals, LTC planning with placement, working on guardianship. We talked about role pc in poc. We talked about remote health as primary in home provider, Lattie Haw wished to try that service. Will send referral. Emotional support provided. Talked about caregiver stress and fatigue, self care.  ? ?History obtained from review of EMR, discussion with Hazle Coca Stepdaughter with Mr.  Enriques.  ?I reviewed available labs, medications, imaging, studies and related documents from the EMR.  Records reviewed and summarized above.  ? ?ROS ?10 point system reviewed all negative except HPI ? ?Physical Exam: ?Constitutional: NAD ?General: frail appearing, pleasantly confused male ?EYES: lids intact ?ENMT: oral mucous membranes moist ?CV: S1S2, RRR, +BLE edema ?Pulmonary: LCTA, no increased work of breathing, no cough, room air ?Abdomen: normo-active BS + 4 quadrants, soft and non tender ?MSK: ambulatory ?Skin: warm and dry ?Neuro:  no generalized weakness,  + cognitive impairment ?Psych: non-anxious affect, A and Oriented to self ?Thank you for the opportunity to participate in the care of Larry Buckley.  The palliative care team will continue to follow. Please call our office at [phone] if we can be of additional assistance.  ? ?Erastus Bartolomei Z Arriyana Rodell, NP  ?  ?

## 2022-02-09 NOTE — ED Notes (Addendum)
Pt belongings  ? ?Manson Passey wallet  ?[employer] blue flip phone  ?Loose change  ?Gray jacket  ?Orange long sleeve shirt  ?Black pants  ?Manson Passey dress shoes  ?Black belt  ?Gray and yellow colored wrist watch  ?White tank top  ?Black socks  ?

## 2022-02-09 NOTE — ED Triage Notes (Signed)
Pt presents to ER with [employer] under IVC paperwork for risk of harm to self.  Pt has dementia, and has been found wandering outside the house several time since his wife died.  Pt has open DSS case and his step-daughter is trying to take over guardianship, but nothing has been made official yet.  Pt denies SI/HI or AVH.  Pt pleasant and cooperative in triage.  Pt A&O x2 at this time at his baseline.   ?

## 2022-02-10 ENCOUNTER — Telehealth: Payer: Self-pay | Admitting: Nurse Practitioner

## 2022-02-10 DIAGNOSIS — F03918 Unspecified dementia, unspecified severity, with other behavioral disturbance: Secondary | ICD-10-CM

## 2022-02-10 LAB — URINALYSIS, ROUTINE W REFLEX MICROSCOPIC
Bilirubin Urine: NEGATIVE
Glucose, UA: NEGATIVE mg/dL
Hgb urine dipstick: NEGATIVE
Ketones, ur: NEGATIVE mg/dL
Leukocytes,Ua: NEGATIVE
Nitrite: NEGATIVE
Protein, ur: NEGATIVE mg/dL
Specific Gravity, Urine: 1.011 (ref 1.005–1.030)
pH: 6 (ref 5.0–8.0)

## 2022-02-10 LAB — URINE DRUG SCREEN, QUALITATIVE (ARMC ONLY)
Amphetamines, Ur Screen: NOT DETECTED
Barbiturates, Ur Screen: NOT DETECTED
Benzodiazepine, Ur Scrn: NOT DETECTED
Cannabinoid 50 Ng, Ur ~~LOC~~: NOT DETECTED
Cocaine Metabolite,Ur ~~LOC~~: NOT DETECTED
MDMA (Ecstasy)Ur Screen: NOT DETECTED
Methadone Scn, Ur: NOT DETECTED
Opiate, Ur Screen: NOT DETECTED
Phencyclidine (PCP) Ur S: NOT DETECTED
Tricyclic, Ur Screen: NOT DETECTED

## 2022-02-10 LAB — RESP PANEL BY RT-PCR (FLU A&B, COVID) ARPGX2
Influenza A by PCR: NEGATIVE
Influenza B by PCR: NEGATIVE
SARS Coronavirus 2 by RT PCR: NEGATIVE

## 2022-02-10 NOTE — ED Notes (Signed)
Pt sleeping, unable to give snack at the moment.  ?

## 2022-02-10 NOTE — ED Notes (Signed)
Pt has been asleep for a few 10-15 minute stretches and wakes up ?

## 2022-02-10 NOTE — ED Notes (Signed)
Pt given warm blankets per request.

## 2022-02-10 NOTE — ED Notes (Signed)
Pt ambulates out of door and toward exiting quad. Security and this nurse notify pt that he needs to stay in room. Pt easily redirectable, staff question if pt needs to use restroom and he claims that he wanted to go there. Pt to restroom and provides sample. Pt returns to door of room and is questioning needing his belongings. Pt returns to room ?

## 2022-02-10 NOTE — ED Notes (Signed)
CSW completed Fl2 and sent over for MD to sign. CSW updated MD that fl2 needed to be signed before it could be sent out.  ? ?CSW spoke with pts step-daughter [name] who states that she completed medicaid app over a month ago for pt. CSW spoke to [facility] worker Marylou Flesher who states that pt was denied and the medicaid ID# is [id number].  ? ?CSW spoke with [name] to explain this. [name] states that she believes the denial is from a previous application she did in February. However, when pt was in [facility] hospital for admission in March the hospital sent documents to [facility] worker with [name] included in the email chain.  ? ?CSW spoke again with Marylou Flesher about this, Annice Pih states that she cannot disclose this information to CSW unless CSW is an authorized user. This would include CSW having a document with pt signature and CSW's sent to [facility] office. CSW was updated that pt would need a new application completed if there were new documents to be sent in. CSW tried to see if documents sent at last admission has already been viewed. Again, CSW was told that CSW is not an authorized user.  ? ?CSW updated pts step daughter [name] of this information. [name] states she will call Medicaid to see if she can start a new application if that is needed.  ? ?TOC to follow and send referral out for placement once there is a status on Medicaid application.  ?

## 2022-02-10 NOTE — ED Notes (Signed)
Pt out of room again, redirected back to room and provided warm blanket. Denies need to go to restroom ?

## 2022-02-10 NOTE — ED Provider Notes (Addendum)
? ?[facility] ?Provider Note ? ? ? Event Date/Time  ? First MD Initiated Contact with Patient 02/10/22 0108   ?  (approximate) ? ? ?History  ? ?Mental Health Problem ? ? ?HPI ? ?Level 5 caveat: History limited by the patient's altered mental status versus chronic dementia versus psychiatric illness ? ?Larry Buckley is a 81 y.o. male whose medical history reportedly includes dementia with behavioral disturbance as well as hypertension.  He presents under involuntary commitment for erratic behavior, worsening altered mental status, occasionally being aggressive with his family, and concern for being a danger to himself.  Reportedly his stepdaughter has been trying to take over guardianship and a DSS case has been opened on him.  They have not had any success finding a place for him to live.  They placed him under IVC tonight due to concerns about his behavior and him representing a danger to himself or to his family. ? ?The patient is in no distress and is pleasant, just wants to sleep.  He told me he believes he has had a small room in a library and that he lives with his parents normally.  He denies any sort of pain.  He denies shortness of breath.  He has no recent urinary complaints including increased urinary frequency or urinary discomfort.  He also denies chest pain and shortness of breath. ?  ? ? ?Physical Exam  ? ?Triage Vital Signs: ?ED Triage Vitals  ?Enc Vitals Group  ?   BP 02/09/22 2305 135/83  ?   Pulse Rate 02/09/22 2305 74  ?   Resp 02/09/22 2305 20  ?   Temp 02/09/22 2305 98.9 ?F (37.2 ?C)  ?   Temp Source 02/09/22 2305 Oral  ?   SpO2 02/09/22 2305 99 %  ?   Weight 02/09/22 2310 88 kg (194 lb 0.1 oz)  ?   Height 02/09/22 2310 1.88 m (6\' 2" )  ?   Head Circumference --   ?   Peak Flow --   ?   Pain Score 02/09/22 2310 0  ?   Pain Loc --   ?   Pain Edu? --   ?   Excl. in GC? --   ? ? ?Most recent vital signs: ?Vitals:  ? 02/09/22 2305  ?BP: 135/83  ?Pulse: 74  ?Resp: 20   ?Temp: 98.9 ?F (37.2 ?C)  ?SpO2: 99%  ? ? ? ?General: Awake, no distress.  Oriented to self only. ?CV:  Good peripheral perfusion.  Normal heart sounds ?Resp:  Normal effort.  Lungs are clear to auscultation. ?Abd:  No distention.  ?Other:  Patient is ambulatory without difficulty.  He is altered and confused, oriented only to self.  Conversant, no focal neurological deficits. ? ? ?ED Results / Procedures / Treatments  ? ?Labs ?(all labs ordered are listed, but only abnormal results are displayed) ?Labs Reviewed  ?COMPREHENSIVE METABOLIC PANEL - Abnormal; Notable for the following components:  ?    Result Value  ? Glucose, Bld 120 (*)   ? All other components within normal limits  ?SALICYLATE LEVEL - Abnormal; Notable for the following components:  ? Salicylate Lvl <7.0 (*)   ? All other components within normal limits  ?ACETAMINOPHEN LEVEL - Abnormal; Notable for the following components:  ? Acetaminophen (Tylenol), Serum <10 (*)   ? All other components within normal limits  ?CBC - Abnormal; Notable for the following components:  ? RBC 4.18 (*)   ? Hemoglobin  12.4 (*)   ? HCT 38.9 (*)   ? All other components within normal limits  ?URINALYSIS, ROUTINE W REFLEX MICROSCOPIC - Abnormal; Notable for the following components:  ? Color, Urine STRAW (*)   ? APPearance CLEAR (*)   ? All other components within normal limits  ?RESP PANEL BY RT-PCR (FLU A&B, COVID) [id number]  ?URINE CULTURE  ?ETHANOL  ?URINE DRUG SCREEN, QUALITATIVE ([facility] ONLY)  ? ? ?PROCEDURES: ? ?Critical Care performed: No ? ?Procedures ? ? ?MEDICATIONS ORDERED IN ED: ?Medications - No data to display ? ? ?IMPRESSION / MDM / ASSESSMENT AND PLAN / ED COURSE  ?I reviewed the triage vital signs and the nursing notes. ?             ?               ? ?Differential diagnosis includes, but is not limited to, dementia, delirium, acute infectious process, psychiatric disorder such as schizophrenia. ? ?Patient reportedly has a history of dementia and this seems to  be worsening over time.  There is no clear indication that he is suffering from an organic encephalopathy or delirium.  His vital signs are stable and within normal limits and he is not oriented to anything other than himself but he is in no distress and having no complaints or concerns.  I reviewed his urinalysis and it shows no sign of infection.  His CBC is within normal limits, ethanol is within normal limits, CMP is within normal limits, Tylenol and salicylate levels are normal, and respiratory viral panel is negative.  Urine drug screen is pending but I have a low suspicion for drug abuse. ? ?I consulted psychiatry who believes the patient will most likely require placement in a memory care unit.  We are consulting social work as well for assistance in placement.  There is no indication the patient needs medical admission at this time. ? ?At the time of this dictation, medication reconciliation has not yet been performed. ? ?The patient has been placed in psychiatric observation due to the need to provide a safe environment for the patient while obtaining psychiatric consultation and evaluation, as well as ongoing medical and medication management to treat the patient's condition.  The patient has been placed under full IVC at this time. ? ? ? ? ?  ? ? ?FINAL CLINICAL IMPRESSION(S) / ED DIAGNOSES  ? ?Final diagnoses:  ?Confusion  ? ? ? ?Rx / DC Orders  ? ?ED Discharge Orders   ? ? None  ? ?  ? ? ? ?Note:  This document was prepared using Dragon voice recognition software and may include unintentional dictation errors. ?  ?Loleta Rose, MD ?02/10/22 [phone] ? ?  ?Loleta Rose, MD ?02/10/22 [phone] ? ?

## 2022-02-10 NOTE — ED Notes (Signed)
Pt wandered over to another pt's doorway; easily redirectable to his own room. Pt calm and cooperative.  ?

## 2022-02-10 NOTE — ED Notes (Signed)
This nurse woke pt up in attempt to collect urine sample, pt wakes up and remains in bed. Pt is repeatedly stating, "no, I'm good," "I'll tell you what I will do, I will go back to sleep." Pt continues to lay in bed throughout requests and conversations with eyes closed. States he is going to sleep through the night and not get up until the AM. Pt is cooperative through this but when he is informed that his blankets are going to be moved so we can help him stand, he grabs blankets and states we will do no such thing.  ? ?Emilee, Tech attempts to communicate with pt and request sample. Pt does same as with this nurse and then eventually mumbles, "if you don't get away from me, you won't get no pee." ?

## 2022-02-10 NOTE — ED Notes (Signed)
IVC pending TOC placement and medicaid authorization ?

## 2022-02-10 NOTE — Consult Note (Signed)
[facility] Face-to-Face Psychiatry Consult  ? ?Reason for Consult: Psychiatric Evaluation  ?Referring Physician: Dr. York Cerise ?Patient Identification: Larry Buckley ?MRN:  [id number] ?Principal Diagnosis: <principal problem not specified> ?Diagnosis:  Active Problems: ?  Hyperlipidemia, mixed ?  Hypertension ?  Mixed dementia (HCC) ?  Dementia with behavioral disturbance (HCC) ?  Prediabetes ? ? ?Total Time spent with patient: 1 hour ? ?Subjective:   ?Larry Buckley is a 81 y.o. male patient presented to [facility] ED via law enforcement under involuntary commitment (IVC). Per the ED triage nurse's note, Pt has dementia and has been found wandering outside the house several times since his wife died.  Pt has an open DSS case, and his step-daughter is trying to take over guardianship, but nothing has been made official yet.  Pt denies SI/HI or AVH.  Pt is pleasant and cooperative in triage.  Pt A&O x2 at this time at his baseline. ?This provider saw the patient face-to-face; the chart was reviewed, and consulted with Dr.Forbach on 02/10/2022 due to the patient's care. It was discussed with the EDP that the patient would need to be referred to social work services with them assisting the family in locating dementia/ skilled nursing facility for his safety. The patient does not meet the criteria for geriatric-psychiatric inpatient admission. ?On evaluation, the patient is alert and oriented x 1-2, restless but cooperative, and mood-congruent with affect. The patient does not appear to be responding to internal or external stimuli. Neither is the patient presenting with any delusional thinking.  ?The patient daughter IVC'd him because of some of the things he was doing. She states he was mean and not taking his medications. He leaves home awhile and is out here alone. ? ?HPI:  ? ?Past Psychiatric History: Dementia (HCC) ? ?Risk to Self:   ?Risk to Others:   ?Prior Inpatient Therapy:   ?Prior Outpatient Therapy:   ? ?Past  Medical History:  ?Past Medical History:  ?Diagnosis Date  ? AAA (abdominal aortic aneurysm) (HCC)   ? Dementia (HCC) 05/21/2020  ? Gout, arthropathy   ? Hyperlipidemia   ? Hypertension   ?  ?Past Surgical History:  ?Procedure Laterality Date  ? PERIPHERAL VASCULAR CATHETERIZATION N/A 02/08/2016  ? Procedure: Endovascular Repair/Stent Graft;  Surgeon: Annice Needy, MD;  Location: [facility] INVASIVE CV LAB;  Service: Cardiovascular;  Laterality: N/A;  ? ?Family History:  ?Family History  ?Problem Relation Age of Onset  ? Cancer Mother   ? Diabetes Mother   ? Heart disease Father   ? Hypertension Father   ? Heart disease Brother   ? ?Family Psychiatric  History:  ?Social History:  ?Social History  ? ?Substance and Sexual Activity  ?Alcohol Use Yes  ?   ?Social History  ? ?Substance and Sexual Activity  ?Drug Use No  ?  ?Social History  ? ?Socioeconomic History  ? Marital status: Married  ?  Spouse name: Not on file  ? Number of children: Not on file  ? Years of education: Not on file  ? Highest education level: Not on file  ?Occupational History  ? Not on file  ?Tobacco Use  ? Smoking status: Former  ? Smokeless tobacco: Never  ?Substance and Sexual Activity  ? Alcohol use: Yes  ? Drug use: No  ? Sexual activity: Not on file  ?Other Topics Concern  ? Not on file  ?Social History Narrative  ? Not on file  ? ?Social Determinants of Health  ? ?Physicist, medical  Strain: Low Risk   ? Difficulty of Paying Living Expenses: Not hard at all  ?Food Insecurity: No Food Insecurity  ? Worried About Programme researcher, broadcasting/film/videounning Out of Food in the Last Year: Never true  ? Ran Out of Food in the Last Year: Never true  ?Transportation Needs: No Transportation Needs  ? Lack of Transportation (Medical): No  ? Lack of Transportation (Non-Medical): No  ?Physical Activity: Insufficiently Active  ? Days of Exercise per Week: 4 days  ? Minutes of Exercise per Session: 30 min  ?Stress: No Stress Concern Present  ? Feeling of Stress : Not at all  ?Social Connections:  Moderately Integrated  ? Frequency of Communication with Friends and Family: More than three times a week  ? Frequency of Social Gatherings with Friends and Family: More than three times a week  ? Attends Religious Services: 1 to 4 times per year  ? Active Member of Clubs or Organizations: No  ? Attends BankerClub or Organization Meetings: Never  ? Marital Status: Married  ? ?Additional Social History: ?  ? ?Allergies:   ?Allergies  ?Allergen Reactions  ? Lotensin [Benazepril Hcl] Swelling  ? ? ?Labs:  ?Results for orders placed or performed during the hospital encounter of 02/09/22 (from the past 48 hour(s))  ?Comprehensive metabolic panel     Status: Abnormal  ? Collection Time: 02/09/22 11:13 PM  ?Result Value Ref Range  ? Sodium 138 135 - 145 mmol/L  ? Potassium 4.3 3.5 - 5.1 mmol/L  ? Chloride 104 98 - 111 mmol/L  ? CO2 29 22 - 32 mmol/L  ? Glucose, Bld 120 (H) 70 - 99 mg/dL  ?  Comment: Glucose reference range applies only to samples taken after fasting for at least 8 hours.  ? BUN 19 8 - 23 mg/dL  ? Creatinine, Ser 1.10 0.61 - 1.24 mg/dL  ? Calcium 9.6 8.9 - 10.3 mg/dL  ? Total Protein 8.0 6.5 - 8.1 g/dL  ? Albumin 3.5 3.5 - 5.0 g/dL  ? AST 17 15 - 41 U/L  ? ALT 15 0 - 44 U/L  ? Alkaline Phosphatase 70 38 - 126 U/L  ? Total Bilirubin 0.5 0.3 - 1.2 mg/dL  ? GFR, Estimated >60 >60 mL/min  ?  Comment: (NOTE) ?Calculated using the CKD-EPI Creatinine Equation (2021) ?  ? Anion gap 5 5 - 15  ?  Comment: Performed at [facility], [address] [place], [place] [address]  ?Ethanol     Status: None  ? Collection Time: 02/09/22 11:13 PM  ?Result Value Ref Range  ? Alcohol, Ethyl (B) <10 <10 mg/dL  ?  Comment: (NOTE) ?Lowest detectable limit for serum alcohol is 10 mg/dL. ? ?For medical purposes only. ?Performed at [facility], [address] [place], ?[place] [address] ?  ?Salicylate level     Status: Abnormal  ? Collection Time: 02/09/22 11:13 PM  ?Result Value Ref Range  ? Salicylate Lvl <7.0 (L)  7.0 - 30.0 mg/dL  ?  Comment: Performed at [facility], [address] [place], [place] [address]  ?Acetaminophen level     Status: Abnormal  ? Collection Time: 02/09/22 11:13 PM  ?Result Value Ref Range  ? Acetaminophen (Tylenol), Serum <10 (L) 10 - 30 ug/mL  ?  Comment: (NOTE) ?Therapeutic concentrations vary significantly. A range of 10-30 ug/mL  ?may be an effective concentration for many patients. However, some  ?are best treated at concentrations outside of this range. ?Acetaminophen concentrations >150 ug/mL  at 4 hours after ingestion  ?and >50 ug/mL at 12 hours after ingestion are often associated with  ?toxic reactions. ? ?Performed at [facility], [address] [place], ?[place] [address] ?  ?cbc     Status: Abnormal  ? Collection Time: 02/09/22 11:13 PM  ?Result Value Ref Range  ? WBC 4.8 4.0 - 10.5 K/uL  ? RBC 4.18 (L) 4.22 - 5.81 MIL/uL  ? Hemoglobin 12.4 (L) 13.0 - 17.0 g/dL  ? HCT 38.9 (L) 39.0 - 52.0 %  ? MCV 93.1 80.0 - 100.0 fL  ? MCH 29.7 26.0 - 34.0 pg  ? MCHC 31.9 30.0 - 36.0 g/dL  ? RDW 13.2 11.5 - 15.5 %  ? Platelets 353 150 - 400 K/uL  ? nRBC 0.0 0.0 - 0.2 %  ?  Comment: Performed at [facility], [address], [place], [place] [address]  ?Resp Panel by RT-PCR (Flu A&B, Covid) Nasopharyngeal Swab     Status: None  ? Collection Time: 02/09/22 11:23 PM  ? Specimen: Nasopharyngeal Swab; Nasopharyngeal(NP) swabs in vial transport medium  ?Result Value Ref Range  ? SARS Coronavirus 2 by RT PCR NEGATIVE NEGATIVE  ?  Comment: (NOTE) ?SARS-CoV-2 target nucleic acids are NOT DETECTED. ? ?The SARS-CoV-2 RNA is generally detectable in upper respiratory ?specimens during the acute phase of infection. The lowest ?concentration of SARS-CoV-2 viral copies this assay can detect is ?138 copies/mL. A negative result does not preclude SARS-Cov-2 ?infection and should not be used as the sole basis for treatment or ?other patient management decisions. A negative result may  occur with  ?improper specimen collection/handling, submission of specimen other ?than nasopharyngeal swab, presence of viral mutation(s) within the ?areas targeted by this assay, and inadequate number of vir

## 2022-02-10 NOTE — ED Notes (Signed)
Obtained urine sample, labeled and sent to lab at this time. No other needs voiced. ?

## 2022-02-10 NOTE — Telephone Encounter (Signed)
Copied from CRM [id number]. Topic: Medicare AWV ?>> Feb 10, 2022  1:23 PM Leigh Aurora wrote: ?Reason for CRM:  ?Left message for patient to call back and schedule Medicare Annual Wellness Visit (AWV) to be done virtually or by telephone. ? ?No hx of AWV eligible as of 03/16/13 ? ?Please schedule at anytime with [facility] Health Advisor.     ? ?45 Minutes appointment  ? ?Any questions, please call me at [phone] ?

## 2022-02-10 NOTE — [place] FL2 (Signed)
?  [place] MEDICAID FL2 LEVEL OF CARE SCREENING TOOL  ?  ? ?IDENTIFICATION  ?Patient Name: ?Larry Buckley Birthdate: [birth year] Sex: male Admission Date (Current Location): ?02/09/2022  ?[place] Number: ? [place] ?  Facility and Address:  ?[facility], [address] [place], [place] [address] ?     Provider Number: ?[id number]  ?Attending Physician Name and Address:  ?No att. providers found ? Relative Name and Phone Number:  ?Aviva Kluver (Daughter)   [phone] ?   ?Current Level of Care: ?Hospital Recommended Level of Care: ?Memory Care Prior Approval Number: ?  ? ?Date Approved/Denied: ?  PASRR Number: ?[id number] A ? ?Discharge Plan: ?Other (Comment) (Memory Care) ?  ? ?Current Diagnoses: ?Patient Active Problem List  ? Diagnosis Date Noted  ? Edema of both legs 01/27/2022  ? Prediabetes 01/04/2022  ? Dementia with behavioral disturbance (HCC) 12/29/2021  ? History of abdominal aortic aneurysm (AAA) repair 09/25/2021  ? Mixed dementia (HCC) 07/11/2021  ? B12 deficiency 07/11/2021  ? Hyperlipidemia, mixed   ? Hypertension   ? Gout, arthropathy   ? ? ?Orientation RESPIRATION BLADDER Height & Weight   ?  ?Self ? Normal Continent Weight: 194 lb 0.1 oz (88 kg) ?Height:  6\' 2"  (188 cm)  ?BEHAVIORAL SYMPTOMS/MOOD NEUROLOGICAL BOWEL NUTRITION STATUS  ?Wanderer   Continent Diet  ?AMBULATORY STATUS COMMUNICATION OF NEEDS Skin   ?Limited Assist Verbally Normal ?  ?  ?  ?    ?     ?     ? ? ?Personal Care Assistance Level of Assistance  ?Bathing, Feeding, Dressing Bathing Assistance: Limited assistance ?Feeding assistance: Independent ?Dressing Assistance: Limited assistance ?   ? ?Functional Limitations Info  ?Sight, Hearing, Speech Sight Info: Adequate ?Hearing Info: Adequate ?Speech Info: Adequate  ? ? ?SPECIAL CARE FACTORS FREQUENCY  ?    ?  ?  ?  ?  ?  ?  ?   ? ? ?Contractures Contractures Info: Not present  ? ? ?Additional Factors Info  ?Code Status, Allergies Code Status  Info: FULL ?Allergies Info: Lotensin (Benazepril Hcl) ?  ?  ?  ?   ? ?Current Medications (02/10/2022):  This is the current hospital active medication list ?No current facility-administered medications for this encounter.  ? ?Current Outpatient Medications  ?Medication Sig Dispense Refill  ? amLODipine (NORVASC) 10 MG tablet Take 1 tablet (10 mg total) by mouth daily. 90 tablet 4  ? aspirin EC 81 MG tablet Take 1 tablet (81 mg total) by mouth daily. 90 tablet 4  ? atorvastatin (LIPITOR) 20 MG tablet Take 1 tablet (20 mg total) by mouth daily. 90 tablet 4  ? divalproex (DEPAKOTE SPRINKLE) 125 MG capsule Take 1 capsule (125 mg total) by mouth every 12 (twelve) hours. 180 capsule 1  ? mirtazapine (REMERON) 15 MG tablet Take 15 mg by mouth at bedtime.    ? risperiDONE (RISPERDAL) 0.5 MG tablet Take 1 tablet (0.5 mg total) by mouth 2 (two) times daily. 180 tablet 1  ? ? ? ?Discharge Medications: ?Please see discharge summary for a list of discharge medications. ? ?Relevant Imaging Results: ? ?Relevant Lab Results: ? ? ?Additional Information ?SSN: [id number] ? ?2997, LCSWA ? ? ? ? ?

## 2022-02-10 NOTE — Telephone Encounter (Signed)
Pt's step daughter calling back to advise pt is in the hospital.  She is not what is going to happen.  Will put on hold for now ?

## 2022-02-10 NOTE — ED Notes (Signed)
Pt woken up again by staff to request urine sample. Pt continues to tell staff to leave him alone and that he wants to sleep. Later states he will give urine later. After more conversation, pt states he does not want staff in there and that he is going to sleep until morning because it is not that time yet ?

## 2022-02-10 NOTE — ED Notes (Signed)
IVC pending consult   

## 2022-02-10 NOTE — ED Notes (Signed)
Pt given breakfast tray and drink at this time. 

## 2022-02-10 NOTE — ED Notes (Signed)
Dr. Karma Greaser communicates that urine is needed, prior sample could not be used. States no straight cath is needed as it would result in potential violent behavior due to history. Pt is needed to be woken up and have urine requested ?

## 2022-02-11 LAB — URINE CULTURE: Culture: 60000 — AB

## 2022-02-11 NOTE — ED Notes (Signed)
Pt ambulated to the restroom with out difficulty.

## 2022-02-11 NOTE — ED Notes (Signed)
IVC/pending TOC placement 

## 2022-02-11 NOTE — ED Notes (Signed)
Pt given breakfast tray

## 2022-02-11 NOTE — ED Provider Notes (Signed)
Emergency Medicine Observation Re-evaluation Note ? ?Larry Buckley is a 81 y.o. male, seen on rounds today.  Pt initially presented to the ED for complaints of Mental Health Problem ?Currently, the patient is resting, voices no medical complaint. ? ?Physical Exam  ?BP (!) 151/62 (BP Location: Left Arm)   Pulse 96   Temp 98.3 ?F (36.8 ?C)   Resp 18   Ht 6\' 2"  (1.88 m)   Wt 88 kg   SpO2 100%   BMI 24.91 kg/m?  ?Physical Exam ?General: Resting in no acute distress ?Cardiac: No cyanosis ?Lungs: Equal rise and fall ?Psych: Not agitated ? ?ED Course / MDM  ?EKG:  ? ?I have reviewed the labs performed to date as well as medications administered while in observation.  Recent changes in the last 24 hours include no events overnight. ? ?Plan  ?Current plan is for psychiatric disposition. ?Larry Buckley is under involuntary commitment. ?  ? ?  ?Antoine Poche, MD ?02/11/22 [phone] ? ?

## 2022-02-11 NOTE — ED Notes (Signed)
Lunch and drink provided. 

## 2022-02-11 NOTE — ED Notes (Addendum)
Report to include Situation, Background, Assessment, and Recommendations received from Amy RN. Patient alert and oriented X 2, warm and dry, in no acute distress. Patient denies SI, HI, AVH and pain. Patient made aware of Q15 minute rounds and security cameras for their safety. Patient instructed to come to me with needs or concerns. ? ?

## 2022-02-11 NOTE — ED Notes (Signed)
Pt was provided with snack and drink ?

## 2022-02-12 DIAGNOSIS — F0390 Unspecified dementia without behavioral disturbance: Secondary | ICD-10-CM

## 2022-02-12 MED ORDER — RISPERIDONE 1 MG PO TABS
0.5000 mg | ORAL_TABLET | Freq: Once | ORAL | Status: AC
  Administered 2022-02-13: 0.5 mg via ORAL
  Filled 2022-02-12: qty 1

## 2022-02-12 MED ORDER — AMLODIPINE BESYLATE 5 MG PO TABS
10.0000 mg | ORAL_TABLET | Freq: Every day | ORAL | Status: DC
  Administered 2022-02-12 – 2022-03-15 (×29): 10 mg via ORAL
  Filled 2022-02-12 (×31): qty 2

## 2022-02-12 MED ORDER — ASPIRIN 81 MG PO TBEC
81.0000 mg | DELAYED_RELEASE_TABLET | Freq: Every day | ORAL | Status: DC
  Administered 2022-02-12 – 2022-03-15 (×29): 81 mg via ORAL
  Filled 2022-02-12 (×31): qty 1

## 2022-02-12 MED ORDER — ATORVASTATIN CALCIUM 20 MG PO TABS
20.0000 mg | ORAL_TABLET | Freq: Every day | ORAL | Status: DC
  Administered 2022-02-12 – 2022-03-15 (×29): 20 mg via ORAL
  Filled 2022-02-12 (×31): qty 1

## 2022-02-12 NOTE — ED Notes (Signed)
Dinner tray and drink given ?

## 2022-02-12 NOTE — ED Provider Notes (Signed)
Emergency Medicine Observation Re-evaluation Note ? ?Krue Peterka is a 81 y.o. male, seen on rounds today.  Pt initially presented to the ED for complaints of Mental Health Problem ? ? ?Physical Exam  ?BP (!) 151/78 (BP Location: Right Arm)   Pulse 64   Temp 98.5 ?F (36.9 ?C) (Oral)   Resp 17   Ht 6\' 2"  (1.88 m)   Wt 88 kg   SpO2 97%   BMI 24.91 kg/m?  ?Physical Exam ?General: NAD ?Cardiac: well perfused ?Lungs: unlabored ? ?ED Course / MDM  ?EKG:  ? ?I have reviewed the labs performed to date as well as medications administered while in observation.  Recent changes in the last 24 hours include. ? ?Plan  ?Current plan is for psych/soc. ? is under involuntary commitment. ?  ? ?  ?Antoine Poche, MD ?02/12/22 0820 ? ?

## 2022-02-12 NOTE — ED Notes (Signed)
Report to include Situation, Background, Assessment, and Recommendations received from Amy RN. Patient alert and oriented, warm and dry, in no acute distress. Patient denies SI, HI, AVH and pain. Patient made aware of Q15 minute rounds and security cameras for their safety. Patient instructed to come to me with needs or concerns.  

## 2022-02-12 NOTE — ED Notes (Signed)
Snack given.

## 2022-02-12 NOTE — ED Notes (Signed)
IVCpending TOC placement and medicaid application ?

## 2022-02-12 NOTE — TOC Progression Note (Signed)
Transition of Care (TOC) - Progression Note  ? ? ?Patient Details  ?Name: Dereck Agerton ?MRN: [id number] ?Date of Birth: Oct 24, 1940 ? ?Transition of Care (TOC) CM/SW Contact  ?Andrena Margerum L Reece Mcbroom, LCSWA ?Phone Number: ?02/12/2022, 9:34 AM ? ?Clinical Narrative:    ? ?CSW called patient's step daughter [name] to follow up about Medicaid application. [name] reported Medicaid worker Annice Pih confirmed receiving the application but did not express any issues or inform her it was denied. She reported "If it has to be redone, I'm not redoing it. He will have to be a ward of the state. I'm done." She expressed frustration with APS, medicaid workers, and systems involved with making taking care of the patient very difficult.  ? ? TOC plan of care ongoing. ?  ? ?Expected Discharge Plan and Services ?  ?  ?  ?  ?  ?                ?  ?  ?  ?  ?  ?  ?  ?  ?  ?  ? ? ?Social Determinants of Health (SDOH) Interventions ?  ? ?Readmission Risk Interventions ?   ? View : No data to display.  ?  ?  ?  ? ? ?

## 2022-02-12 NOTE — ED Notes (Signed)
Patient ate his pizza which was his dinner just now.  ?

## 2022-02-12 NOTE — ED Notes (Signed)
Pt given breakfast tray and drink 

## 2022-02-13 MED ORDER — RISPERIDONE 1 MG PO TABS
0.5000 mg | ORAL_TABLET | Freq: Once | ORAL | Status: AC
  Administered 2022-02-13: 0.5 mg via ORAL
  Filled 2022-02-13 (×2): qty 1

## 2022-02-13 NOTE — ED Provider Notes (Signed)
Emergency Medicine Observation Re-evaluation Note ? ?Larry Buckley is a 81 y.o. male, seen on rounds today. ? ?Physical Exam  ?BP (!) 149/61 (BP Location: Right Arm)   Pulse 62   Temp 99.1 ?F (37.3 ?C) (Oral)   Resp 16   Ht 6\' 2"  (1.88 m)   Wt 88 kg   SpO2 97%   BMI 24.91 kg/m?  ?Physical Exam ?General: Patient walking around all night long per nursing staff.  He is walking in his room right now after I left nursing reported he was pulling at the door rattling doorknobs so much it got loose.  They asked for something for him.  He had some Risperdal earlier I gave him 0.5 p.o. ?Lungs: Patient in no respiratory distress ?Psych: Patient confused but apparently at baseline ? ?ED Course / MDM  ?EKG:  ? ? ? ?Plan  ?Current plan is for further mental health evaluation. ?Larry Buckley is under involuntary commitment. ?  ? ?  ?Antoine Poche, MD ?02/13/22 [phone] ? ?

## 2022-02-13 NOTE — ED Notes (Signed)
Snack and beverage given. 

## 2022-02-13 NOTE — ED Notes (Addendum)
Refused shower, offered new linens or clothing-declines. ? ?Encouraged patient to tidy room, provided trash can for patient to throw away any trash in patient room with staff supervision. ? ?Pt refused vital signs. ?

## 2022-02-13 NOTE — ED Notes (Signed)
Meal tray and beverage provided. 

## 2022-02-13 NOTE — ED Notes (Signed)
Patient is delirium wandering to other patients room. Unable to redirect. Notified EDP ?

## 2022-02-13 NOTE — ED Notes (Addendum)
Pt standing at door.  ?

## 2022-02-13 NOTE — ED Notes (Signed)
Pericare provided. Pt wearing new pants and underwear at this time. Incontinent of stool only. ?

## 2022-02-13 NOTE — ED Notes (Signed)
Given snack and warm blankets. Encouraged to stay in room ?

## 2022-02-13 NOTE — ED Notes (Signed)
Pt used restroom ?

## 2022-02-13 NOTE — ED Notes (Signed)
Pt has refused vs at this moment.  ?

## 2022-02-13 NOTE — ED Notes (Signed)
Yanking at door handle, despite multiple attempts and verbal explanations that door is locked for safety and he cannot pull at door handle continuously due to risk of breaking or loosening. ?

## 2022-02-13 NOTE — ED Notes (Addendum)
Patient provided snack at appropriate snack time.   ?

## 2022-02-13 NOTE — ED Notes (Signed)
Breakfast placed at bedside. 

## 2022-02-13 NOTE — ED Notes (Signed)
Report to include Situation, Background, Assessment, and Recommendations received from Andrea RN. Patient alert and oriented, warm and dry, in no acute distress. Patient denies SI, HI, AVH and pain. Patient made aware of Q15 minute rounds and security cameras for their safety. Patient instructed to come to me with needs or concerns.  

## 2022-02-13 NOTE — TOC Progression Note (Signed)
Transition of Care (TOC) - Progression Note  ? ? ?Patient Details  ?Name: Larry Buckley ?MRN: [id number] ?Date of Birth: Jan 07, 1941 ? ?Transition of Care (TOC) CM/SW Contact  ?Shelbie Hutching, RN ?Phone Number: ?02/13/2022, 4:26 PM ? ?Clinical Narrative:    ?RNCM has called and left a message with patient's stepdaughter for return call to discuss discharge planning.  RNCM has also reached out to APS since daughter in law said they were involved, message left for return call.   ? ? ? ?Expected Discharge Plan: Memory Care ?Barriers to Discharge: Other (must enter comment) (looking for Memory Care) ? ?Expected Discharge Plan and Services ?Expected Discharge Plan: Memory Care ?  ?Discharge Planning Services: CM Consult ?  ?Living arrangements for the past 2 months: [place] ?                ?DME Arranged: N/A ?DME Agency: NA ?  ?  ?  ?HH Arranged: NA ?Banks Agency: NA ?  ?  ?  ? ? ?Social Determinants of Health (SDOH) Interventions ?  ? ?Readmission Risk Interventions ?   ? View : No data to display.  ?  ?  ?  ? ? ?

## 2022-02-13 NOTE — ED Notes (Signed)
Continues to refuse shower or vital signs. ?

## 2022-02-13 NOTE — ED Notes (Addendum)
Pt standing near window. Pt redirected back to room. ?

## 2022-02-13 NOTE — ED Notes (Signed)
EDP notified that pt has been pacing the floor since 3AM and RN unable to reorient for any substantial amount of time. ?

## 2022-02-13 NOTE — ED Notes (Signed)
Lunch tray placed at bedside. 

## 2022-02-13 NOTE — TOC Progression Note (Signed)
Transition of Care (TOC) - Progression Note  ? ? ?Patient Details  ?Name: Larry Buckley ?MRN: [id number] ?Date of Birth: Mar 08, 1941 ? ?Transition of Care (TOC) CM/SW Contact  ?Allayne Butcher, RN ?Phone Number: ?02/13/2022, 4:33 PM ? ?Clinical Narrative:    ?Received call back from APS- patient has been assigned a case worker- Winfield Rast.  APS will forward my voicemail to [place] and have him call me.   ? ? ?Expected Discharge Plan: Memory Care ?Barriers to Discharge: Other (must enter comment) (looking for Memory Care) ? ?Expected Discharge Plan and Services ?Expected Discharge Plan: Memory Care ?  ?Discharge Planning Services: CM Consult ?  ?Living arrangements for the past 2 months: Single Family Home ?                ?DME Arranged: N/A ?DME Agency: NA ?  ?  ?  ?HH Arranged: NA ?HH Agency: NA ?  ?  ?  ? ? ?Social Determinants of Health (SDOH) Interventions ?  ? ?Readmission Risk Interventions ?   ? View : No data to display.  ?  ?  ?  ? ? ?

## 2022-02-13 NOTE — ED Notes (Signed)
IVC/Pending Placement 

## 2022-02-13 NOTE — ED Notes (Signed)
Pt is resting comfortably with no acute distress. ?

## 2022-02-14 NOTE — ED Notes (Signed)
Patient provided snack at appropriate snack time.  Pt sleeping and refused snack.  Pt allowed to rest, cont to monitor as ordered ?

## 2022-02-14 NOTE — ED Notes (Signed)
Attempted to reach daughter, HIPPA compliant message left on VM ?

## 2022-02-14 NOTE — ED Notes (Signed)
Report received from [place], RN including SBAR. On initial round after report Pt is warm/dry, resting quietly in room without any s/s of distress.  Will continue to monitor throughout shift as ordered for any changes in behaviors and for continued safety.   ?

## 2022-02-14 NOTE — ED Notes (Signed)
Hospital meal provided, pt tolerated w/o complaints.  Waste discarded appropriately.  

## 2022-02-14 NOTE — ED Notes (Signed)
Staff attempted to administer medication(s) at appropriate med time.  Pt sleeping and refused medication.  Staff to continue to encourage pt to adhere to medication regime provided by ED MD. ?

## 2022-02-14 NOTE — TOC Progression Note (Signed)
Transition of Care (TOC) - Progression Note  ? ? ?Patient Details  ?Name: Larry Buckley ?MRN: [id number] ?Date of Birth: [birth year] ? ?Transition of Care (TOC) CM/SW Contact  ?Allayne Butcher, RN ?Phone Number: ?02/14/2022, 4:27 PM ? ?Clinical Narrative:    ?Attempted to call patient's stepdaughter again today, message left on voicemail.  RNCM also reached back out to DSS to try and reach SW [facility], they did not have an extension for him but they transferred me to his supervisor Dorthula Perfect - Voice mail left for her to return call or get [name] to return call.  ? ? ?Expected Discharge Plan: Memory Care ?Barriers to Discharge: Other (must enter comment) (looking for Memory Care) ? ?Expected Discharge Plan and Services ?Expected Discharge Plan: Memory Care ?  ?Discharge Planning Services: CM Consult ?  ?Living arrangements for the past 2 months: Single Family Home ?                ?DME Arranged: N/A ?DME Agency: NA ?  ?  ?  ?HH Arranged: NA ?HH Agency: NA ?  ?  ?  ? ? ?Social Determinants of Health (SDOH) Interventions ?  ? ?Readmission Risk Interventions ?   ? View : No data to display.  ?  ?  ?  ? ? ?

## 2022-02-14 NOTE — ED Notes (Signed)
Unable to obtain BP at this time due to pt sleeping. Will attempt when pt is awake. ?

## 2022-02-14 NOTE — ED Notes (Signed)
IVC pending placement 

## 2022-02-14 NOTE — ED Notes (Signed)
Pt refused vitals, allowed staff to check pulse and O2 %.  Pt in bed sleeping, went back to sleep ?

## 2022-02-14 NOTE — ED Notes (Signed)
IVC/Pending placement 

## 2022-02-14 NOTE — ED Provider Notes (Addendum)
Emergency Medicine Observation Re-evaluation Note ? ?Larry Buckley is a 81 y.o. male, seen on rounds today. ? ?Physical Exam  ?BP (!) 170/70 (BP Location: Left Arm)   Pulse 65   Temp 98.4 ?F (36.9 ?C) (Oral)   Resp 14   Ht 6\' 2"  (1.88 m)   Wt 88 kg   SpO2 100%   BMI 24.91 kg/m?  ?Physical Exam ?General: Patient resting comfortably in bed ?Lungs: Patient in no respiratory distress ?Psych: Patient not combative ? ?ED Course / MDM  ?EKG:  ? ? ? ?Plan  ?Current plan is for psych and social work evaluation.  Patient seems much calmer this morning.  We will have to watch his blood pressure however. ?Larry Buckley is under involuntary commitment. ?  ? ?  ? ?  ?Antoine Poche, MD ?02/14/22 0732 ? ?

## 2022-02-14 NOTE — ED Notes (Signed)
Attempted to reach daughter called cell and work numbers. VM on work # stated that she would be out of the office until 02/21/2022 ?

## 2022-02-15 ENCOUNTER — Telehealth: Payer: Medicare PPO

## 2022-02-15 MED ORDER — OLANZAPINE 5 MG PO TBDP
5.0000 mg | ORAL_TABLET | Freq: Two times a day (BID) | ORAL | Status: DC | PRN
  Administered 2022-02-15 – 2022-03-09 (×5): 5 mg via ORAL
  Filled 2022-02-15 (×6): qty 1

## 2022-02-15 MED ORDER — LORAZEPAM 1 MG PO TABS
1.0000 mg | ORAL_TABLET | ORAL | Status: AC
  Administered 2022-02-15: 1 mg via ORAL
  Filled 2022-02-15: qty 1

## 2022-02-15 MED ORDER — LORAZEPAM 1 MG PO TABS
1.0000 mg | ORAL_TABLET | Freq: Once | ORAL | Status: DC
Start: 2022-02-15 — End: 2022-02-15

## 2022-02-15 MED ORDER — TRAZODONE HCL 50 MG PO TABS
50.0000 mg | ORAL_TABLET | Freq: Every day | ORAL | Status: DC
  Administered 2022-02-15 – 2022-03-14 (×27): 50 mg via ORAL
  Filled 2022-02-15 (×28): qty 1

## 2022-02-15 NOTE — Consult Note (Signed)
Report from nurse that patient is somewhat agitated, pulling on door knobs. Ordered Zyprexa 5 mg twice daily as needed. Trazodone 50 mg at bedtime for sleep. Per report from bedside RN, patient took Zyprexa and is now calm.   Sherlon Handing, PMHNP-BC

## 2022-02-15 NOTE — ED Notes (Signed)
Report received from Katie, RN including SBAR. Patient alert and oriented, warm and dry, in no acute distress. Patient denies SI, HI, AVH and pain. Patient made aware of Q15 minute rounds and security cameras for their safety. Patient instructed to come to this nurse with needs or concerns.  

## 2022-02-15 NOTE — ED Notes (Signed)
Patient provided snack at appropriate snack time.  Pt consumed 100% of snack provided, tolerated well w/o complaints   Trash disposted of appropriately by patient.  

## 2022-02-15 NOTE — ED Notes (Signed)
Pt given nighttime snack. 

## 2022-02-15 NOTE — ED Provider Notes (Signed)
Emergency Medicine Observation Re-evaluation Note ? ?Larry Buckley is a 81 y.o. male, seen on rounds today.  Pt initially presented to the ED for complaints of Mental Health Problem ? ?Currently, the patient is sitting up in bed.  Patient reportedly did not sleep much overnight so BHU nurse is requesting to give the morning medications a little bit early while he is currently awake. ? ?Physical Exam  ?Blood pressure 125/70, pulse (!) 113, temperature 98.8 ?F (37.1 ?C), temperature source Oral, resp. rate 16, height 6\' 2"  (1.88 m), weight 88 kg, SpO2 100 %. ? ?Physical Exam ?General: No apparent distress ?Pulm: Normal WOB ?Neuro: sitting in bed  ?   ? ?ED Course / MDM  ?  ? ?I have reviewed the labs performed to date as well as medications administered while in observation.  Recent changes in the last 24 hours include none ? ?Plan  ? ?Current plan is to continue to wait for psych plan/placement if felt warranted  ?Patient is under full IVC at this time. ?  ? , MD ?02/15/22 [phone] ? ?

## 2022-02-15 NOTE — ED Notes (Signed)
Pt continues to be confused and and asking for a ride home. Hoping his wife will come and pick him up ?

## 2022-02-15 NOTE — ED Notes (Signed)
Pt did not go into restroom, door was opened but he did not go in after asking to go stating "I don't need to pee." ?

## 2022-02-15 NOTE — ED Notes (Signed)
Pt continues to wander, is pulling the doors off of other peoples room and going to them because he sees the lights from the tv and believes he is in his house. Continual redirection is required. Pt gets annoyed when he is told he is in the ED as he states there are people in his home but thanks this nruse for coming.  ?

## 2022-02-15 NOTE — ED Notes (Signed)
Pt awake in room, verbal ok to administer a.m. medications early as pt is willing.  Staff noticed 'lump' under mattress; pt had placed eaten dinner tray under mattress.  Pt continues to be pleasant but confused.  Mr Larry Buckley is easily directable   ?

## 2022-02-15 NOTE — ED Notes (Signed)
Pt takes PO with some convincing. Pt continually tries to get to nurses station desk through interaction. Pt keeps asking to go through the front door ?

## 2022-02-15 NOTE — ED Notes (Signed)
Report received from Kimberly S , RN including SBAR. On initial round after report Pt is warm/dry, resting quietly in room without any s/s of distress.  Will continue to monitor throughout shift as ordered for any changes in behaviors and for continued safety.   

## 2022-02-15 NOTE — ED Notes (Signed)
Pt on arrival is wandering ni another room with no pants on. Pt redirected and provided with new, clean pants. Pt has urinated in the floor in his room. Cleaned at this time and room is cleaned up.  ?

## 2022-02-15 NOTE — ED Notes (Signed)
Pt disoriented, walking into other patients rooms, pt is turning door knobs looking for an exit. Staff is able to redirect patient w/o incident. ?

## 2022-02-15 NOTE — ED Notes (Signed)
Hospital meal provided, pt tolerated w/o complaints.  Waste discarded appropriately.  

## 2022-02-15 NOTE — TOC Progression Note (Signed)
Transition of Care (TOC) - Progression Note  ? ? ?Patient Details  ?Name: Larry Buckley ?MRN: [id number] ?Date of Birth: Nov 22, 1940 ? ?Transition of Care (TOC) CM/SW Contact  ?Allayne Butcher, RN ?Phone Number: ?02/15/2022, 11:49 AM ? ?Clinical Narrative:    ?[place] DSS APS line to file report for abandonment, and I have not heard from SW [employer] ?Winfield Rast did return my call a little later.  RNCM notified him that [name] called and said she no longer wanted to be contacted about patient and that she wanted patient to be a ward of the state.  [name] had also tried to contact [place] yesterday but she was not answering.  [name] will start working on protective order and APS will start guardianship process.  He does believe that the patient's Medicaid is approved.  Will reach out to Financial Counselor to see if this is accurate.  ? ? ?Expected Discharge Plan: Memory Care ?Barriers to Discharge: Other (must enter comment) (looking for Memory Care) ? ?Expected Discharge Plan and Services ?Expected Discharge Plan: Memory Care ?  ?Discharge Planning Services: CM Consult ?  ?Living arrangements for the past 2 months: Single Family Home ?                ?DME Arranged: N/A ?DME Agency: NA ?  ?  ?  ?HH Arranged: NA ?HH Agency: NA ?  ?  ?  ? ? ?Social Determinants of Health (SDOH) Interventions ?  ? ?Readmission Risk Interventions ?   ? View : No data to display.  ?  ?  ?  ? ? ?

## 2022-02-15 NOTE — ED Notes (Signed)
Pt behaviors and attitude has continually worsened. He is now being confrontational with this nurse, security and tech caring for him. He continues to feel he is at his home and is frustrated his deceased wife has not entered. Pt is tugging and hitting door through units as they will not open. Dr. Sidney Ace is notified of situation for inquire of relief for pt.  ?

## 2022-02-15 NOTE — ED Notes (Addendum)
Attempted to call pt daughter, Aviva Kluver on cell phone @ [phone].  HIPPA compliant VM left ?

## 2022-02-15 NOTE — ED Notes (Signed)
IVC/Pending Placement 

## 2022-02-15 NOTE — ED Notes (Signed)
Patient offered snack at appropriate snack time.  Pt sleeping and refused snack ?

## 2022-02-15 NOTE — ED Notes (Signed)
Pt Stepdaughter Larry Buckley c/b this a.m and notified us that she no longer wants to be contacted in reference to Mr Cooprider. She has no interest in becoming his guardian or being responsible for him in any capacity. She requested that he become "a ward of the state". She did ask that we only contact her when we find placement so she can go visit him.  TOC-RN notified ?

## 2022-02-15 NOTE — ED Notes (Signed)
Pt continues to be disoriented and confused, continually asking when his wife is getting here to pick him up. Pt is redirectable to an extent. Returns to room and then immediately back to same. Attempts to pull handles to all doors.  ?

## 2022-02-16 MED ORDER — DIVALPROEX SODIUM 125 MG PO CSDR
125.0000 mg | DELAYED_RELEASE_CAPSULE | Freq: Two times a day (BID) | ORAL | Status: DC
  Administered 2022-02-16 – 2022-03-15 (×53): 125 mg via ORAL
  Filled 2022-02-16 (×57): qty 1

## 2022-02-16 MED ORDER — FUROSEMIDE 40 MG PO TABS
20.0000 mg | ORAL_TABLET | Freq: Every day | ORAL | Status: DC
  Administered 2022-02-16 – 2022-03-15 (×27): 20 mg via ORAL
  Filled 2022-02-16 (×28): qty 1

## 2022-02-16 NOTE — ED Notes (Addendum)
Pt assisted into wheelchair in BHU room 3 by this RN, Joyice Faster EDT and Caitlyn EDT. Pt 2 person assist. Pt alert and minimally verbal with this RN. This RN noted pt's LE has non-pitting edema. Pt transferred to ED room 22 and assisted into bed by this RN and Gabby EDT. Pt given new non-skid socks. Bed placed up against wall and fall pads placed in floor. Bedside commode placed in room. VS taken at this time.  ?

## 2022-02-16 NOTE — ED Notes (Signed)
Pt sitting no side of bed, pt watched lower self to ground on one knee and sit in kneeling position. Pt unable to stand back up. Requires two person assist to stand. Placed in chair beside bed. Dr. Sidney Ace is notified and Freddrick March RN, MD to come assess. ?

## 2022-02-16 NOTE — ED Notes (Signed)
IVC/Pending placement 

## 2022-02-16 NOTE — ED Notes (Signed)
Restroom door on unit opened so that pt may go use restroom if he wants. Pt had started to cause it ot make loud noises through whole unit ?

## 2022-02-16 NOTE — ED Notes (Signed)
Pt given breakfast tray

## 2022-02-16 NOTE — ED Notes (Signed)
Pt has one to one sitter since arrival and will continue at this time for safety purposes ?

## 2022-02-16 NOTE — ED Notes (Signed)
Due to pt behavior and rummaging around trying to get into other pt rooms and becoming irritated with staff, this nurse contacted Dr. Sidney Ace and Erie Noe, Charge RN to update and consult with ideas on appropriate care. Will continue to follow ?

## 2022-02-16 NOTE — ED Notes (Signed)
Pt with soiled brief and linens wandering in room. Pt assisted to bathroom and provided clean scrub pants and brief ?

## 2022-02-16 NOTE — ED Notes (Signed)
Hospital meal provided, pt tolerated w/o complaints.  Waste discarded appropriately.  

## 2022-02-16 NOTE — ED Notes (Signed)
Pt required RN and NT to be at bedside for patient safety.  Pt is weak and unsteady on his feet.  Difficult to re-direct due to dementia.  ?

## 2022-02-16 NOTE — ED Notes (Signed)
Pt given meal tray.

## 2022-02-16 NOTE — ED Notes (Addendum)
MD made aware of missing home medications from [facility] and new bilateral LE edema. No new orders for blood work or EKG at this time.  ?

## 2022-02-16 NOTE — ED Notes (Signed)
Pt is asleep at the time, snack will be given when awake  ?

## 2022-02-16 NOTE — ED Provider Notes (Signed)
I was asked to evaluate the patient due to difficulty ambulating.  Patient has had some delirium overnight I gave him 1 mg of p.o. Ativan and she had gotten as needed Zyprexa and trazodone as well.  Did not sleep at all overnight.  Had been previously pacing around this morning having difficulty getting out of the chair.  Sitting on the side of the chair not wanting to get up.  On my evaluation patient is sitting down he has normal strength in the bilateral upper extremities and face is symmetric he has some difficulty following commands but apparently this is his baseline.  I am not concerned for acute CVA I think this is a combination of delirium due to being in the hospital no sleep and medications with his underlying dementia with behavioral disturbance as well as possibly some overmedication.  I do not think that really he is really appropriate for the BHU at this point given he is requiring so much assistance.  Discussed with charge nurse and will get patient in bed over on the main ED. ?  ?Georga Hacking, MD ?02/16/22 [phone] ? ?

## 2022-02-16 NOTE — ED Notes (Signed)
Report received from [place], [place]. Patient currently sleeping in recliner, respirations regular and unlabored. Q15 minute rounds and observation by Psychologist, counselling to continue. Will assess patient once awake.  ?

## 2022-02-16 NOTE — ED Notes (Signed)
Pt continues to meander around side of bed, pt when gets up appears to be unsteady but is able to move around.  ?

## 2022-02-16 NOTE — ED Notes (Signed)
Pt is now sitting on side of bed, grabbing mattress at the top side and rolling it up toward him. Unclear his objective. ?

## 2022-02-16 NOTE — ED Notes (Signed)
Edema noted to patients extremities. This nurse spoke to Maudie Mercury, RN who cared for pt yesterday and reports pt had no edema noted. This nurse reviewed pt history and PTA medications and it is noted that not all home meds are ordered. This nurse sent secure chat message to Dr. Starleen Blue about situation. She is reviewing at this time.  ?

## 2022-02-16 NOTE — ED Notes (Signed)
Pt has woken up, started to urinate from bed and continued to urinate across the unit until he stood in the one spot and finished. Pt then takes pants off in middle of unit due to being wet. This nurse attempts to intervene with pt but it is too late. Pt requires to be cleaned, new socks and pants. Pt requires assistance putting on pants and socks as he cannot do it himself. Pt requires one person assist back to room. Pt very unsteady walking on own. Pt back to bed at this time.  ?

## 2022-02-16 NOTE — ED Notes (Signed)
Pt remains asleep at this time. Will not wake pt for his night meds currently, tech sitting 1-1 with pt will alert nruse whe pt wakes ?

## 2022-02-16 NOTE — ED Notes (Signed)
Pt made his way over to the restroom after standing at nurses station and twisting handle for approx 45 minutes. Nurse enteres area and asks if pt needs to go to restroom. Pt denies and is frustrated with nurse asking, states "can't you see I am working" pt does not allow nruse to enter nurses station again as he is trying to get into station. Nurse goes around through sally port to exit unit ?

## 2022-02-16 NOTE — ED Notes (Signed)
Dr. Starleen Blue finishing assessment at this time. Pt is unsafe to be in [place] per MD. Will communicate with Charge on safe care room for pt. At this time pt is not following commands by staff to remain in spot and is requiring assistance to stand and not fall ?

## 2022-02-16 NOTE — ED Notes (Signed)
VS ASSESS. NO OTHER NEEDS FOUND AT THIS MOMENT. ?

## 2022-02-16 NOTE — ED Notes (Signed)
Pt was about to go into another pt's rm. Pt became agitated and Network engineer when tried to redirect to pts rm  ?

## 2022-02-16 NOTE — ED Notes (Signed)
Pt had been standing in corner beside restroom for several minutes. Nurse approaches pt and attemtpes to allow him into restroom. He is grabbing at door asking to lay it down behind day room chairs. Pt is more tired than has been and slow moving. Pt is requiring 2 person assist by this nurse and Fernanda Drum. Pt had difficulty standing and moving feet one step to the next. Able to have pt make it to the room, aided in getting into bed, required to be laid down. Pt covered with blanket and is resting in bed at this time. ?

## 2022-02-16 NOTE — ED Notes (Signed)
This tech and Tresa Endo, RN assisted pt with a complete wipe bath. New beh health scrubs provided. Dry brief applied. Pt is sitting on the side of the bed and eating breakfast. This tech is having to sit 1:1 safety.  ?

## 2022-02-16 NOTE — ED Notes (Signed)
Pt has required staff to be with him since last note was placed. He is now saying he is tired but refuses to lay down because he is going home to get in his own bed tonight. Pt is not able to be reoriented. Pt is now shuffling and complaining of needing to sleep but will not even sit.  ?

## 2022-02-16 NOTE — ED Notes (Signed)
Pt assisted to bathroom by this Designer, television/film set. Unable to measure output at this time.  ?

## 2022-02-16 NOTE — ED Notes (Signed)
Pt wandering in room and redirected to recliner at this time  ?

## 2022-02-17 NOTE — ED Provider Notes (Signed)
Emergency Medicine Observation Re-evaluation Note ? ?Larry Buckley is a 81 y.o. male, initially seen for psychiatric concerns.  Patient is being referred out to geriatric psychiatric units.  No acute events overnight or today. ? ?Physical Exam  ?BP 119/60 (BP Location: Right Arm)   Pulse (!) 57   Temp 98.8 ?F (37.1 ?C) (Oral)   Resp 14   Ht 6\' 2"  (1.88 m)   Wt 88 kg   SpO2 100%   BMI 24.91 kg/m?  ? ? ?ED Course / MDM  ? ?No recent lab work for review. ?Plan  ?Current plan is for placement to an appropriate living facility/geriatric facility once available. ?Larry Buckley is under involuntary commitment. ?  ? ?  ?Harvest Dark, MD ?02/17/22 2255 ? ?

## 2022-02-17 NOTE — ED Notes (Signed)
Pt is relaxing in recliner with no acute distress. ?

## 2022-02-17 NOTE — ED Notes (Signed)
Pt currently sleeping will obtain VS and give breakfast when pt awakens. ?

## 2022-02-17 NOTE — ED Notes (Signed)
Larry Buckley sitting with pt. Pt is safe and resting comfortably in recliner that's in locked position. ?

## 2022-02-17 NOTE — TOC Progression Note (Signed)
Transition of Care (TOC) - Progression Note  ? ? ?Patient Details  ?Name: Larry Buckley ?MRN: [id number] ?Date of Birth: Aug 30, 1941 ? ?Transition of Care (TOC) CM/SW Contact  ?Allayne Butcher, RN ?Phone Number: ?02/17/2022, 4:54 PM ? ?Clinical Narrative:    ?RNCM has not heard back from [place] DSS about guardianship or protective order and when financial counselor checked patient's Medicaid is still not active or approved.  RNCM will follow up on Monday. ? ? ?Expected Discharge Plan: Memory Care ?Barriers to Discharge: Other (must enter comment) (looking for Memory Care) ? ?Expected Discharge Plan and Services ?Expected Discharge Plan: Memory Care ?  ?Discharge Planning Services: CM Consult ?  ?Living arrangements for the past 2 months: Single Family Home ?                ?DME Arranged: N/A ?DME Agency: NA ?  ?  ?  ?HH Arranged: NA ?HH Agency: NA ?  ?  ?  ? ? ?Social Determinants of Health (SDOH) Interventions ?  ? ?Readmission Risk Interventions ?   ? View : No data to display.  ?  ?  ?  ? ? ?

## 2022-02-17 NOTE — ED Notes (Signed)
PT VOL/ TOC is working on Placement ?

## 2022-02-17 NOTE — ED Notes (Signed)
Pt sitting up in recliner eating breakfast at this time  

## 2022-02-17 NOTE — ED Notes (Signed)
Pt pushing recliner backward. NT redirected pt to sit, while NT reposition recliner. ?

## 2022-02-17 NOTE — ED Provider Notes (Signed)
Today's Vitals  ? 02/15/22 1949 02/16/22 0835 02/16/22 1827 02/16/22 2013  ?BP: (!) 142/58 133/72 125/65 (!) 119/57  ?Pulse: 63 (!) 54 64 60  ?Resp: 18 14 18 15   ?Temp: 98 ?F (36.7 ?C) 97.6 ?F (36.4 ?C) 97.6 ?F (36.4 ?C)   ?TempSrc: Oral Axillary Axillary   ?SpO2: 98% 99% 99% 98%  ?Weight:      ?Height:      ?PainSc:      ? ?Body mass index is 24.91 kg/m?. ? ? ?No acute events overnight.  Awaiting social work disposition to a memory care unit. ?  ?Larry Buckley, , DO ?02/17/22 04/19/22 ? ?

## 2022-02-17 NOTE — ED Notes (Signed)
PT VOL/Pending Placement ?

## 2022-02-17 NOTE — ED Notes (Signed)
Pt awake at this time. Attempted to get up from recliner, but was redirectable. Blankets placed on pt. This tech will continue to monitor pt. ?

## 2022-02-18 NOTE — ED Notes (Signed)
Pt given breakfast tray and drink at this time. 

## 2022-02-18 NOTE — ED Provider Notes (Signed)
----------------------------------------- ?  5:23 AM on 02/18/2022 ?----------------------------------------- ? ? ?Blood pressure 119/60, pulse (!) 57, temperature 98.8 ?F (37.1 ?C), temperature source Oral, resp. rate 14, height 6\' 2"  (1.88 m), weight 88 kg, SpO2 100 %. ? ?The patient is calm and cooperative at this time.  There have been no acute events since the last update.  Awaiting disposition plan from Social Work team. ?  ? , MD ?02/18/22 [phone] ? ?

## 2022-02-18 NOTE — ED Notes (Signed)
VOL/pending placement 

## 2022-02-19 ENCOUNTER — Emergency Department: Payer: Medicare PPO

## 2022-02-19 IMAGING — CT CT HEAD W/O CM
4 series · 16 of 47 positions shown, 18 images · non-contrast
Comparison: Head CT [DATE], brain MRI [DATE]

CLINICAL DATA: Head trauma, minor (Age >= 65y)



[Series 2: head wo · axial · 0.48mm/px · z∈[-157,-27]mm · 7 of 36 slices shown, 9 images]
[im 5/36  brain]
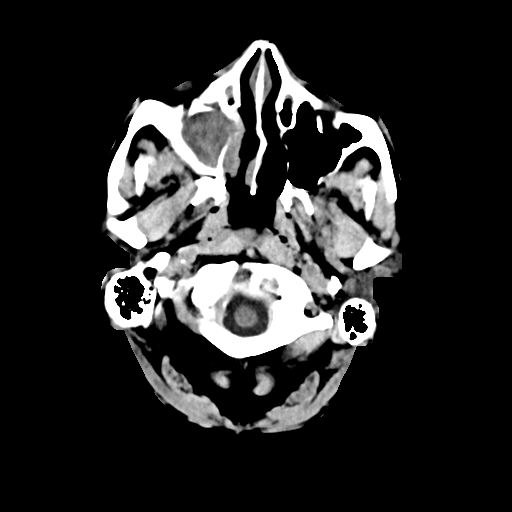
[im 5/36  bone]
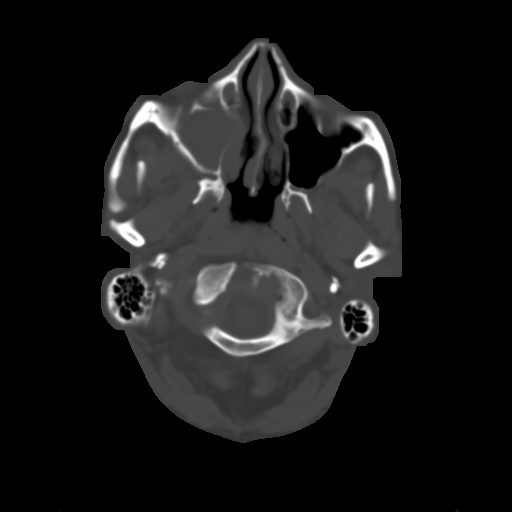
[im 9/36  brain]
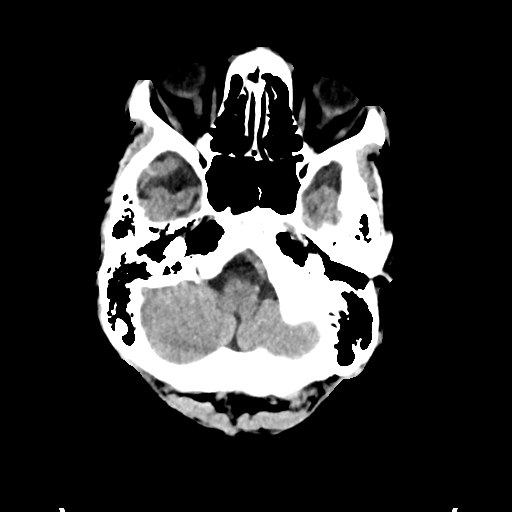
[im 14/36  brain]
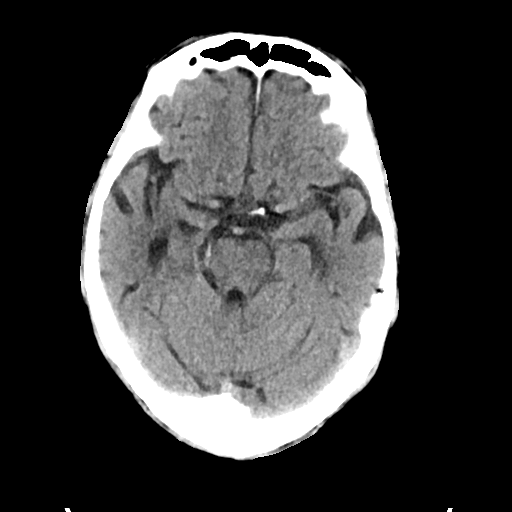
[im 18/36  brain]
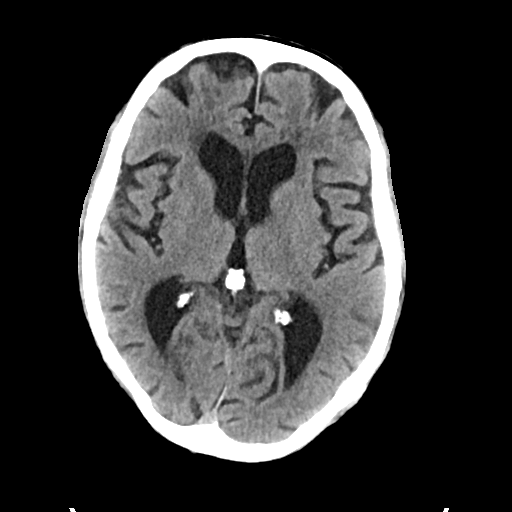
[im 22/36  brain]
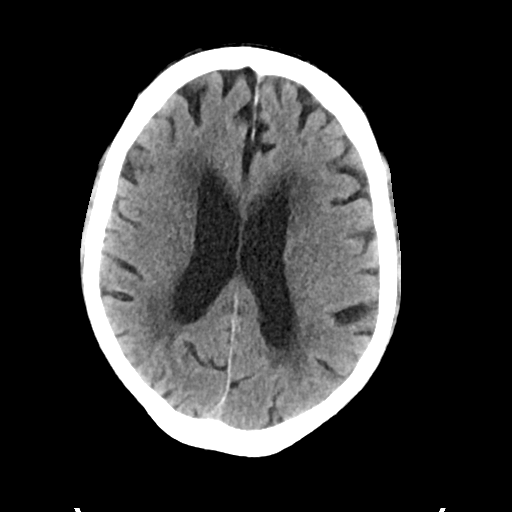
[im 22/36  bone]
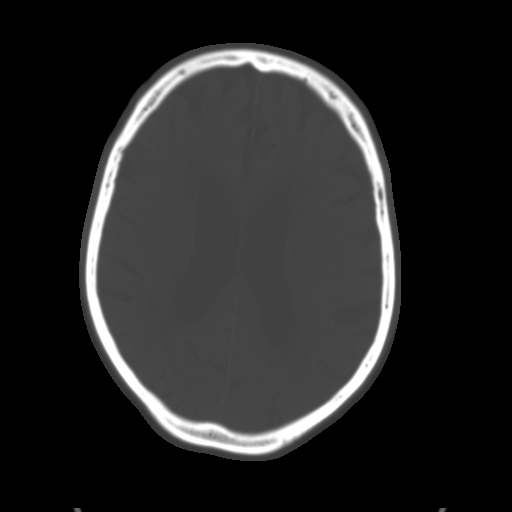
[im 27/36  brain]
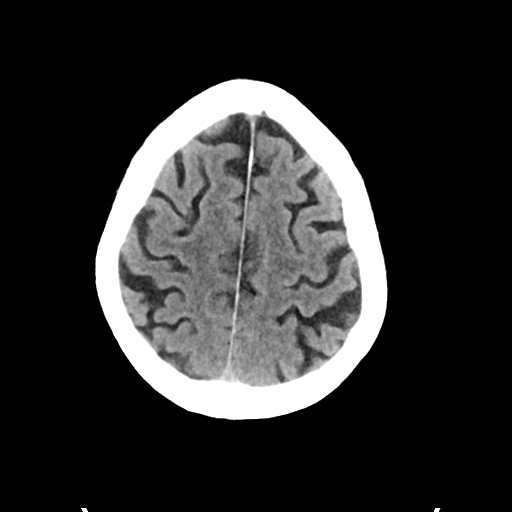
[im 31/36  brain]
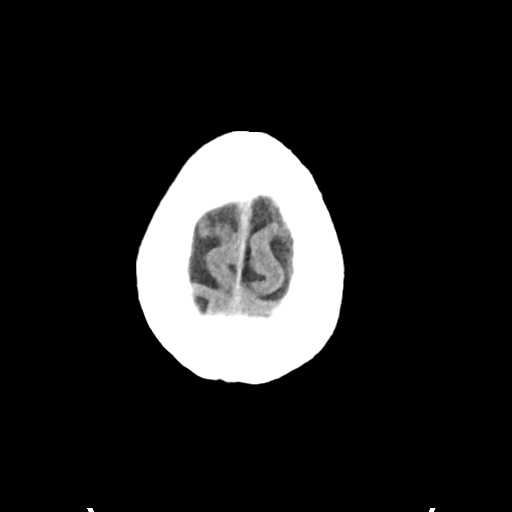

[Series 3: head bone · axial · 0.48mm/px · z∈[-161,-125]mm · 3 of 89 slices shown]
[im 9/89  bone]
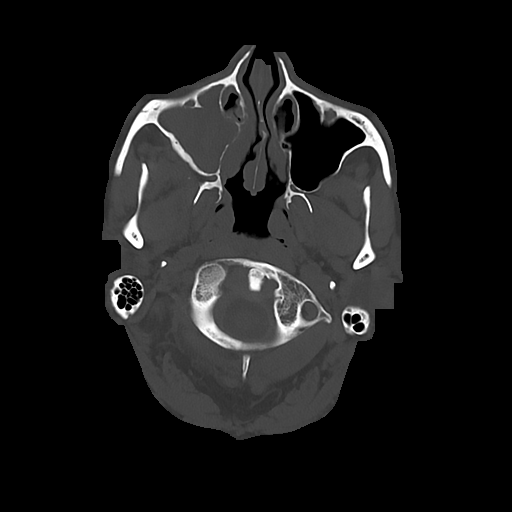
[im 18/89  bone]
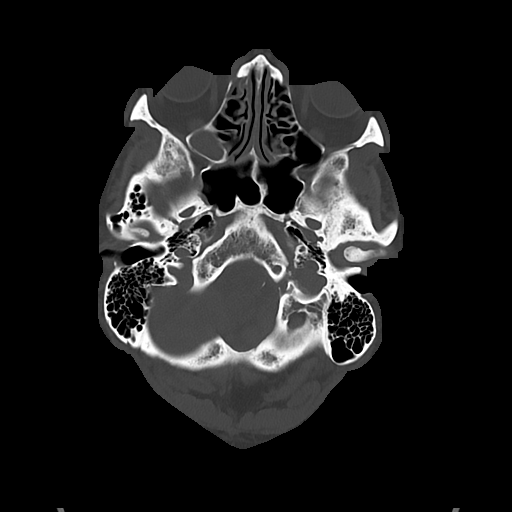
[im 27/89  bone]
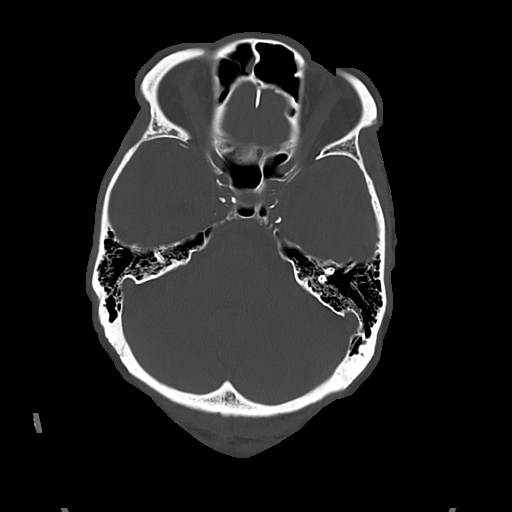

[Series 4: cor soft · coronal · 0.40mm/px · 3 of 82 slices shown]
[im 28/82  brain]
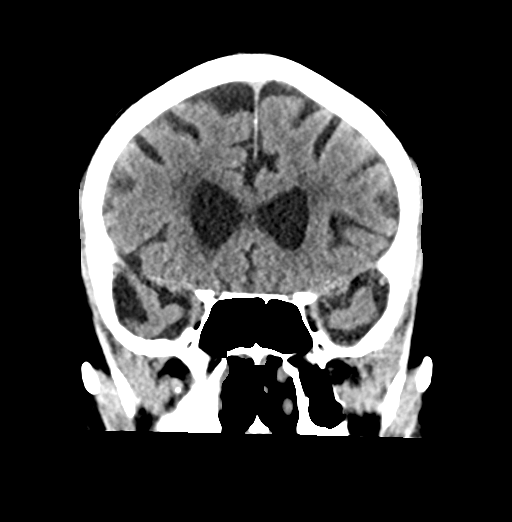
[im 37/82  brain]
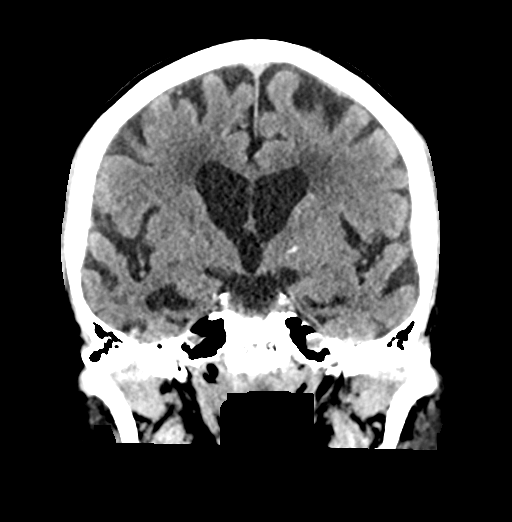
[im 46/82  brain]
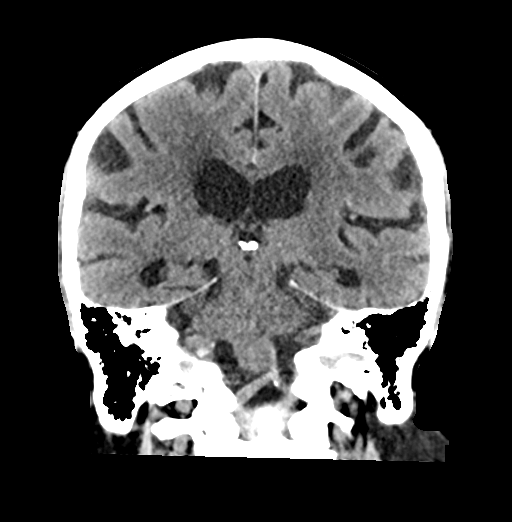

[Series 5: sag soft · sagittal · 0.40mm/px · 3 of 64 slices shown]
[im 22/64  brain]
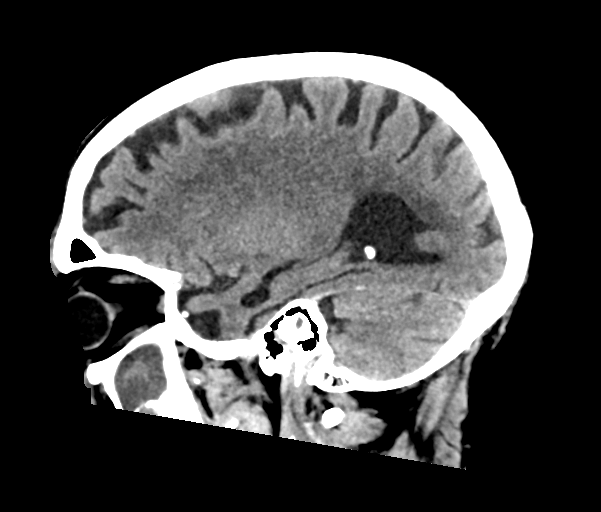
[im 32/64  brain]
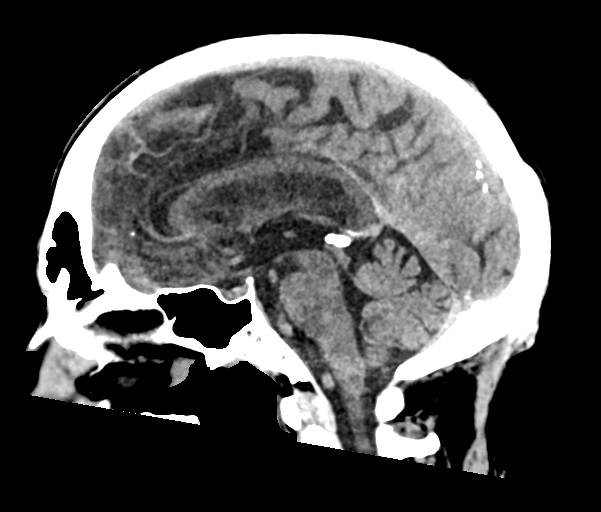
[im 43/64  brain]
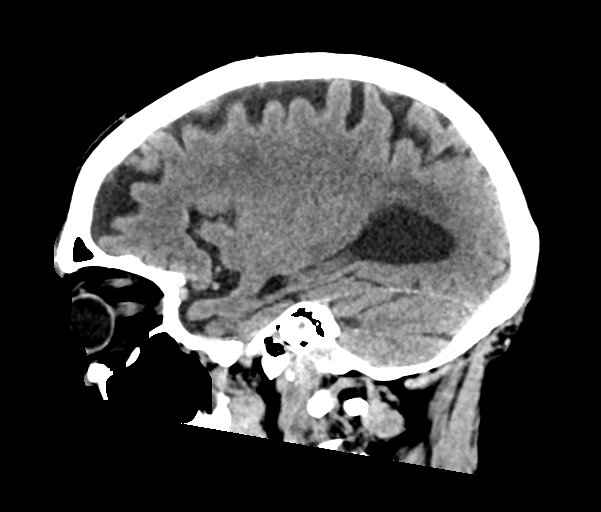

[16 of 47 positions shown; findings below may reference images not displayed]

FINDINGS: Brain: Stable degree of atrophy and chronic small vessel ischemia.
No intracranial hemorrhage, mass effect, or midline shift. No
hydrocephalus. The basilar cisterns are patent. Small remote right
occipital infarct. No evidence of territorial infarct or acute
ischemia. No extra-axial or intracranial fluid collection.

Vascular: Atherosclerosis of skullbase vasculature without
hyperdense vessel or abnormal calcification.

Skull: No fracture or focal lesion.

Sinuses/Orbits: Chronic mucosal thickening of ethmoid air cells.
There is opacification of right side of sphenoid sinus as well as
right maxillary sinus, evidence of un erupted molar on the right.

Other: No confluent scalp contusion.
IMPRESSION: 1. No acute intracranial abnormality. No skull fracture.
2. Stable atrophy and chronic small vessel ischemia. Small remote
right occipital infarct.

## 2022-02-19 MED ORDER — LORAZEPAM 1 MG PO TABS
1.0000 mg | ORAL_TABLET | Freq: Once | ORAL | Status: AC
  Administered 2022-02-19: 1 mg via ORAL
  Filled 2022-02-19: qty 1

## 2022-02-19 NOTE — ED Notes (Signed)
Returned to room from CT. 

## 2022-02-19 NOTE — ED Notes (Signed)
This RN has been with patient for the past 30 minutes attempting to get patient to stay in his room in bed or chair for safety.  Patient up and down from bed multiple times walking around room and into hall way.  Patient not aggressive towards staff but unable to redirect patient to sit for safety. ?

## 2022-02-19 NOTE — ED Notes (Signed)
Patient walking out of his room and attempting to walk up the hallway.  Patient easily redirectable back to his room. ?

## 2022-02-19 NOTE — ED Notes (Signed)
Patient walking out of the door way and attempting to walk down hallway.  Patient redirected into room. ?

## 2022-02-19 NOTE — ED Notes (Signed)
Patient back in hallway.  Larry Buckley, EDT and off duty sheriff deputy attempting to redirect patient back into room. ?

## 2022-02-19 NOTE — ED Notes (Signed)
Patient back in doorway looking down hallway. ?

## 2022-02-19 NOTE — ED Notes (Signed)
Patient at doorway and getting confrontational with security saying he wants to walk out side and they can't make him stay in the room.  ?

## 2022-02-19 NOTE — ED Notes (Signed)
Patient back a doorway asking to go out.  Explained it was night time and he needed to stay in his room. ?

## 2022-02-19 NOTE — ED Notes (Signed)
Pt given lunch tray at this time

## 2022-02-19 NOTE — ED Provider Notes (Signed)
----------------------------------------- ?  8:29 PM on 02/19/2022 ?----------------------------------------- ?Patient noted to have fallen in his room, striking his head.  He did not lose consciousness and is not anticoagulated, no focal neurologic deficits noted on reexamination.  He does complain of a headache and given his advanced age we will check CT head and cervical spine.  No evidence of injury to his trunk or extremities. ? ?----------------------------------------- ?9:21 PM on 02/19/2022 ?----------------------------------------- ?CT imaging of head and neck shows no evidence of traumatic injury, patient does have apparent soft tissue mass at the level of his C4 vertebra.  No findings to suggest abscess or other infectious process on exam, suspect small lipoma.  Patient continues to await placement per social work. ?  ?Chesley Noon, MD ?02/19/22 2122 ? ?

## 2022-02-19 NOTE — ED Notes (Signed)
Patient transported to CT 

## 2022-02-19 NOTE — ED Notes (Signed)
Pt awake, sitting in recliner eating breakfast. ?

## 2022-02-19 NOTE — ED Notes (Signed)
Patient walking to doorway and attempting to walk out into hallway.  Patient easily redirectable back into room. ?

## 2022-02-19 NOTE — ED Notes (Signed)
Loud noise heard from area of quad, while checking all rooms, patient found on floor.  Just prior to fall patient had been sitting in recliner with leg rest up with eyes closed.  Recliner with leg rest still in upright position and ?that patient was attempting to stand and not putting leg rest down.  Patient complaining of headache.  Dr Charna Archer notified. ?

## 2022-02-20 NOTE — ED Notes (Signed)
VOLUNTARY continues to await placement from [facility] dispo ?

## 2022-02-20 NOTE — ED Notes (Signed)
Pt sleeping in bed at this time. Pt not woken for snack or vitals at this time as he hasnt had much sleep.  ?

## 2022-02-20 NOTE — ED Notes (Signed)
PATIENT PROVIDED WITH DINNER TRAY, PATIENT WANDERING ABOUT IN ROOM ?

## 2022-02-20 NOTE — ED Provider Notes (Signed)
----------------------------------------- ?  6:36 AM on 02/20/2022 ?----------------------------------------- ? ? ?Blood pressure (!) 141/77, pulse (!) 56, temperature 98.5 ?F (36.9 ?C), temperature source Oral, resp. rate 18, height 6\' 2"  (1.88 m), weight 88 kg, SpO2 100 %. ? ?The patient is calm and cooperative at this time.  There have been no acute events since the last update.  Awaiting disposition plan from Social Work team(s). ?  ? , MD ?02/20/22 [phone] ? ?

## 2022-02-20 NOTE — ED Notes (Signed)
VOL/pending placement 

## 2022-02-20 NOTE — TOC Progression Note (Signed)
Transition of Care (TOC) - Progression Note  ? ? ?Patient Details  ?Name: Larry Buckley ?MRN: [id number] ?Date of Birth: Dec 27, 1940 ? ?Transition of Care (TOC) CM/SW Contact  ?Shelbie Hutching, RN ?Phone Number: ?02/20/2022, 1:02 PM ? ?Clinical Narrative:    ?RNCM spoke with Vira Blanco [place] [phone].  He has filed for protective order and will be applying for guardianship.  Annie Main will be working on [employer] paperwork this afternoon, he will also be following up with Medicaid to try and fast track the application for approval.   ? ? ?Expected Discharge Plan: Memory Care ?Barriers to Discharge: Other (must enter comment) (looking for Memory Care) ? ?Expected Discharge Plan and Services ?Expected Discharge Plan: Memory Care ?  ?Discharge Planning Services: CM Consult ?  ?Living arrangements for the past 2 months: [place] ?                ?DME Arranged: N/A ?DME Agency: NA ?  ?  ?  ?HH Arranged: NA ?[place] Agency: NA ?  ?  ?  ? ? ?Social Determinants of Health (SDOH) Interventions ?  ? ?Readmission Risk Interventions ?   ? View : No data to display.  ?  ?  ?  ? ? ?

## 2022-02-20 NOTE — ED Notes (Signed)
Patient has spent afternoon resting in recliner, watching TV. Voices no needs at this time. Ice cream given due to patient voicing dislike to lunch tray (mac & cheese).  ?

## 2022-02-20 NOTE — TOC Progression Note (Signed)
Transition of Care (TOC) - Progression Note  ? ? ?Patient Details  ?Name: Larry Buckley ?MRN: [id number] ?Date of Birth: [birth year] ? ?Transition of Care (TOC) CM/SW Contact  ?Allayne Butcher, RN ?Phone Number: ?02/20/2022, 1:49 PM ? ?Clinical Narrative:    ?Referral being sent over to [facility], they have a locked Memory Care with bed openings.  SECURE emailed to [email or web address]. ? ? ?Expected Discharge Plan: Memory Care ?Barriers to Discharge: Other (must enter comment) (looking for Memory Care) ? ?Expected Discharge Plan and Services ?Expected Discharge Plan: Memory Care ?  ?Discharge Planning Services: CM Consult ?  ?Living arrangements for the past 2 months: Single Family Home ?                ?DME Arranged: N/A ?DME Agency: NA ?  ?  ?  ?HH Arranged: NA ?HH Agency: NA ?  ?  ?  ? ? ?Social Determinants of Health (SDOH) Interventions ?  ? ?Readmission Risk Interventions ?   ? View : No data to display.  ?  ?  ?  ? ? ?

## 2022-02-20 NOTE — ED Notes (Signed)
Patient is laying in bed with eyes closed. Will obtain vitals when patient is awake.  ?

## 2022-02-21 LAB — URINALYSIS, COMPLETE (UACMP) WITH MICROSCOPIC
Bacteria, UA: NONE SEEN
Bilirubin Urine: NEGATIVE
Glucose, UA: NEGATIVE mg/dL
Hgb urine dipstick: NEGATIVE
Ketones, ur: NEGATIVE mg/dL
Nitrite: NEGATIVE
Protein, ur: NEGATIVE mg/dL
Specific Gravity, Urine: 1.02 (ref 1.005–1.030)
pH: 5 (ref 5.0–8.0)

## 2022-02-21 NOTE — ED Notes (Signed)
VOL/pending placement 

## 2022-02-21 NOTE — ED Notes (Signed)
Pt watching TV and eating lunch in recliner.  ?

## 2022-02-21 NOTE — ED Notes (Signed)
Pt redirected back to his room and given ice cream for a snack.  ?

## 2022-02-21 NOTE — ED Notes (Signed)
Pt asleep in recliner 

## 2022-02-21 NOTE — ED Notes (Signed)
Continued to ask pt for urine sample, states he does not need to go  ?

## 2022-02-21 NOTE — ED Notes (Signed)
Report received from Amy, RN including SBAR. Patient alert and oriented, warm and dry, and in no acute distress. Patient denies SI, HI, AVH and pain. Patient made aware of Q15 minute rounds and Rover and Officer presence for their safety. Patient instructed to come to this nurse with needs or concerns.  °

## 2022-02-21 NOTE — ED Notes (Signed)
Pt continues to wander around in room and into hallway. Pt is redirectable at this time ?

## 2022-02-21 NOTE — ED Notes (Signed)
Pt given nighttime snack. 

## 2022-02-21 NOTE — ED Notes (Signed)
During report, Amy, RN notified this nruse and Derrill Kay, MD of pts old urine culture. Repeat UA ordered ?

## 2022-02-21 NOTE — ED Notes (Signed)
VOLUNTARY continues to await TOC disposition °

## 2022-02-22 MED ORDER — FOSFOMYCIN TROMETHAMINE 3 G PO PACK
3.0000 g | PACK | Freq: Once | ORAL | Status: AC
  Administered 2022-02-22: 3 g via ORAL
  Filled 2022-02-22: qty 3

## 2022-02-22 NOTE — ED Notes (Signed)
VOL  PENDING  PLACEMENT 

## 2022-02-22 NOTE — ED Notes (Signed)
Unable to have pt sit or lay and relax. Pt is able to be redirected and will fold linen ?

## 2022-02-22 NOTE — ED Notes (Signed)
Awake and alert at this time. Given breakfast. ?

## 2022-02-22 NOTE — ED Provider Notes (Addendum)
Was notified by RN that patient had a repeat UA obtained due to culture obtained last week that showed GBS.  Patient does not seem acutely symptomatic from this but is certainly not normal bacteria in the mail.  We will treat with a one-time dose of fosfomycin.  Patient otherwise with stable vital signs pending social work dispo. ?  ?Gilles Chiquito, MD ?02/22/22 0006 ? ?  ?Gilles Chiquito, MD ?02/22/22 [phone] ? ?

## 2022-02-22 NOTE — ED Notes (Signed)
Pt given lunch

## 2022-02-22 NOTE — ED Notes (Signed)
VOL/Pending Placement 

## 2022-02-22 NOTE — ED Notes (Addendum)
Hospital meal provided, pt is sleeping at this time. Will eat when he wakes up. ?

## 2022-02-22 NOTE — ED Notes (Signed)
Pt is repeatedly out of room. Pt is assisted in sitting in recliner, raising legs up and lowering head of chair. Pt provided with drink and table for access and remote.  ?

## 2022-02-22 NOTE — ED Notes (Signed)
Hospital meal provided.  100% consumed, pt tolerated w/o complaints.  Waste discarded appropriately.   

## 2022-02-22 NOTE — ED Notes (Signed)
Pt given nighttime snack and drink ?

## 2022-02-22 NOTE — ED Notes (Addendum)
Per report, pt was up all night and fell asleep around 530AM. Pt sleeping at this time. RR even and unlabored. Will wait for patient to awaken to give meds and vital signs. Pt seeking new placement hold, case management. ?

## 2022-02-23 LAB — URINE CULTURE: Culture: >=100000 — AB

## 2022-02-23 NOTE — TOC Progression Note (Signed)
Transition of Care (TOC) - Progression Note  ? ? ?Patient Details  ?Name: Larry Buckley ?MRN: [id number] ?Date of Birth: 08/08/1941 ? ?Transition of Care (TOC) CM/SW Contact  ?Allayne Butcher, RN ?Phone Number: ?02/23/2022, 3:23 PM ? ?Clinical Narrative:    ?RNCM reached out to [facility] with [place] DSS- [phone], voice mail is full so unable to leave a message, also texted him for update- no response yet.  RNCM reached back out to [facility] to see if they have had a chance to review referral.  Referral sent out to all ALF's in the hub and [facility]- they have a memory care.   ? ? ?Expected Discharge Plan: Memory Care ?Barriers to Discharge: Other (must enter comment) (looking for Memory Care) ? ?Expected Discharge Plan and Services ?Expected Discharge Plan: Memory Care ?  ?Discharge Planning Services: CM Consult ?  ?Living arrangements for the past 2 months: Single Family Home ?                ?DME Arranged: N/A ?DME Agency: NA ?  ?  ?  ?HH Arranged: NA ?HH Agency: NA ?  ?  ?  ? ? ?Social Determinants of Health (SDOH) Interventions ?  ? ?Readmission Risk Interventions ?   ? View : No data to display.  ?  ?  ?  ? ? ?

## 2022-02-23 NOTE — ED Notes (Signed)
Pt given nighttime snack. 

## 2022-02-23 NOTE — ED Notes (Signed)
Pt continuously out of bedroom wandering, pt redirectable, pt refusing to go to bed. ?

## 2022-02-23 NOTE — ED Notes (Signed)
Breakfast tray at bedside. Pt sleeping peacefully in the recliner. ?

## 2022-02-23 NOTE — ED Notes (Signed)
Pt eating dinner meal  ?

## 2022-02-23 NOTE — ED Notes (Signed)
Pt increasingly confused and agitated, PRN med given, see MAR for details  ?

## 2022-02-23 NOTE — ED Notes (Signed)
VOLUNTARY continues to await TOC dispo 

## 2022-02-24 LAB — RESP PANEL BY RT-PCR (FLU A&B, COVID) ARPGX2
Influenza A by PCR: NEGATIVE
Influenza B by PCR: NEGATIVE
SARS Coronavirus 2 by RT PCR: NEGATIVE

## 2022-02-24 NOTE — ED Notes (Signed)
Pt has continued to be pleasantly confused through night. Requires redirection but cooperative ?

## 2022-02-24 NOTE — ED Notes (Signed)
Pt received snack and drink 

## 2022-02-24 NOTE — ED Notes (Signed)
Pts breakfast tray and drink at bedside, pt currently sleeping. 

## 2022-02-24 NOTE — ED Notes (Addendum)
Offcoming RN said pt was acting different on Monday this week than his normal self. MD made aware. ?

## 2022-02-24 NOTE — ED Provider Notes (Signed)
Today's Vitals  ? 02/23/22 1325 02/23/22 1430 02/23/22 1452 02/23/22 1955  ?BP:   (!) 119/59 (!) 156/64  ?Pulse:   (!) 55 62  ?Resp:   16 20  ?Temp:   97.7 ?F (36.5 ?C)   ?TempSrc:   Oral   ?SpO2:   100% 100%  ?Weight:      ?Height:      ?PainSc: Asleep Asleep 0-No pain   ? ?Body mass index is 24.91 kg/m?. ? ? ?No acute events overnight.  Awaiting social work disposition. ?  ?Koltyn Kelsay, Delice Bison, DO ?02/24/22 0306 ? ?

## 2022-02-24 NOTE — ED Notes (Signed)
Report received from Web designer. Patient alert and oriented, warm and dry, and in no acute distress. Patient denies SI, HI, AVH and pain. Patient made aware of Q15 minute rounds and Engineer, drilling presence for their safety. Patient instructed to come to this nurse with needs or concerns.  ?

## 2022-02-24 NOTE — ED Notes (Signed)
Dinner tray and drink provided.  ?

## 2022-02-24 NOTE — ED Notes (Signed)
Pt's bed and blankets were changed and was given clean sheets and blankets.  ?

## 2022-02-24 NOTE — ED Provider Notes (Signed)
I was notified by RN that patient seemed little more tired today than usual.  On my assessment he is eating lunch feeling banana.  He states he feels "good".  He states he has not had a little congestion but denies any chest pain, cough, shortness of breath fevers, nausea or vomiting or diarrhea or urinary symptoms.  He has no pain at this time.  Think is reasonable to retest for COVID since its been over a week since he had any COVID testing.  Do not believe repeat labs are currently indicated.  He was treated with a dose of fosfomycin for some positive urine cultures obtained couple days ago he has no urine symptoms or other findings to suggest any clinically ongoing significant cystitis at this time. ?  ?Gilles Chiquito, MD ?02/24/22 1325 ? ?

## 2022-02-25 NOTE — ED Provider Notes (Signed)
Today's Vitals  ? 02/23/22 1955 02/24/22 1232 02/24/22 1915 02/24/22 1955  ?BP: (!) 156/64 (!) 129/50  (!) 146/64  ?Pulse: 62 64  63  ?Resp: 20 18  18   ?Temp:  98.4 ?F (36.9 ?C)  98.7 ?F (37.1 ?C)  ?TempSrc:  Oral    ?SpO2: 100% 98%  97%  ?Weight:      ?Height:      ?PainSc:  0-No pain 0-No pain   ? ?Body mass index is 24.91 kg/m?. ? ? ?No acute events overnight.  Patient resting comfortably.  Awaiting social work disposition. ?  ?Malinda Mayden, , DO ?02/25/22 0545 ? ?

## 2022-02-25 NOTE — ED Notes (Signed)
Pt in  hallway wanting to strip and wax the floors.   ?

## 2022-02-25 NOTE — ED Notes (Signed)
Patient calm and cooperative.  Requesting grape juice.  Provided with juice.  No other complaints voiced at this time ?

## 2022-02-25 NOTE — ED Notes (Signed)
Pt walked into hallway asking for a fork.  Pt verbally redirected back to his room and given a snack.  Pt cooperative.  ?

## 2022-02-26 NOTE — ED Notes (Signed)
Pt ambulating in hallway, denies needs, however pt is confused, alert to self only, not sure of time, place or date. Pt states he is waiting on a ride to take him home. Pt informed he is at [facility] and it is night time.  ?

## 2022-02-26 NOTE — ED Notes (Signed)
Pt. Is currently sleeping, vitals will be collected when awake, breakfast at nursing station. ?

## 2022-02-26 NOTE — ED Notes (Signed)
Pt given dinner tray at this time.  

## 2022-02-26 NOTE — ED Provider Notes (Signed)
----------------------------------------- ?  8:07 AM on 02/26/2022 ?----------------------------------------- ? ? ?Blood pressure (!) 167/83, pulse 62, temperature 98.4 ?F (36.9 ?C), resp. rate 14, height 1.88 m (6\' 2" ), weight 88 kg, SpO2 98 %. ? ?The patient is calm and cooperative at this time.  There have been no acute events since the last update.  Awaiting disposition plan from [facility] team. ?  ?[facility], MD ?02/26/22 [phone] ? ?

## 2022-02-26 NOTE — ED Notes (Signed)
Pt out in hall several times, was able to be redirected back to room. Pt is calm and cooperative at this time. ?

## 2022-02-26 NOTE — ED Notes (Signed)
VOL/pending placement 

## 2022-02-27 NOTE — ED Notes (Signed)
Patient resting at this time. Breakfast tray placed on bedside table.  ?

## 2022-02-27 NOTE — TOC Progression Note (Signed)
Transition of Care (TOC) - Progression Note  ? ? ?Patient Details  ?Name: Larry Buckley ?MRN: [id number] ?Date of Birth: [birth year] ? ?Transition of Care (TOC) CM/SW Contact  ?Allayne Butcher, RN ?Phone Number: ?02/27/2022, 11:10 AM ? ?Clinical Narrative:    ?RNCM spoke with [place] [employer] SW [phone] to discuss any updates.  [name] is working on the PO for guardianship and will hopefully have that ready to turn in this afternoon.  [name] will be coming to see patient tomorrow at the hospital.  He will also be checking on the Medicaid this afternoon as the worker was out of the office on Friday.  RNCM will cont to send out referrals to ALF Memory Cares.  Have not heard back from [facility].   ? ? ?Expected Discharge Plan: Memory Care ?Barriers to Discharge: Other (must enter comment) (looking for Memory Care) ? ?Expected Discharge Plan and Services ?Expected Discharge Plan: Memory Care ?  ?Discharge Planning Services: CM Consult ?  ?Living arrangements for the past 2 months: Single Family Home ?                ?DME Arranged: N/A ?DME Agency: NA ?  ?  ?  ?HH Arranged: NA ?HH Agency: NA ?  ?  ?  ? ? ?Social Determinants of Health (SDOH) Interventions ?  ? ?Readmission Risk Interventions ?   ? View : No data to display.  ?  ?  ?  ? ? ?

## 2022-02-27 NOTE — ED Notes (Signed)
Lunch tray provided. 

## 2022-02-27 NOTE — ED Notes (Signed)
Report to andrea, rn ?

## 2022-02-27 NOTE — ED Provider Notes (Signed)
----------------------------------------- ?  6:51 AM on 02/27/2022 ?----------------------------------------- ? ? ?Blood pressure (!) 140/50, pulse 76, temperature 98.3 ?F (36.8 ?C), temperature source Oral, resp. rate 18, height 1.88 m (6\' 2" ), weight 88 kg, SpO2 100 %. ? ?The patient is calm and cooperative at this time.  There have been no acute events since the last update.  Awaiting disposition plan from [facility] team. ?  ?Hinda Kehr, MD ?02/27/22 [phone] ? ?

## 2022-02-27 NOTE — ED Notes (Signed)
VOL/Pending Placement 

## 2022-02-27 NOTE — ED Notes (Signed)
VOL  PENDING  PLACEMENT 

## 2022-02-27 NOTE — ED Notes (Signed)
Dinner tray given

## 2022-02-28 NOTE — ED Provider Notes (Signed)
----------------------------------------- ?  5:31 AM on 02/28/2022 ?----------------------------------------- ? ? ?Blood pressure (!) 156/67, pulse 65, temperature 98.4 ?F (36.9 ?C), resp. rate 18, height 6\' 2"  (1.88 m), weight 88 kg, SpO2 100 %. ? ?The patient is calm and cooperative at this time.  There have been no acute events since the last update.  Awaiting disposition plan from Social Work team. ?  ? , MD ?02/28/22 [phone] ? ?

## 2022-02-28 NOTE — ED Notes (Signed)
Pt given dinner tray.

## 2022-02-28 NOTE — ED Notes (Signed)
Hospital meal provided.  100% consumed, pt tolerated w/o complaints.  Waste discarded appropriately.   

## 2022-02-28 NOTE — ED Notes (Signed)
Patient provided snack at appropriate snack time.  Pt consumed 100% of snack provided, tolerated well w/o complaints   Trash disposted of appropriately by patient.  

## 2022-02-28 NOTE — ED Notes (Signed)
VOLUNTARY still awaiting dispo by TOC ?

## 2022-02-28 NOTE — ED Notes (Signed)
Breakfast placed at bedside. Pt sleeping. ?

## 2022-02-28 NOTE — ED Notes (Signed)
Pt voided

## 2022-02-28 NOTE — ED Notes (Signed)
Pt resting comfortably with no acute distress. ?

## 2022-02-28 NOTE — ED Notes (Signed)
Given nighttime snack ?

## 2022-03-01 NOTE — ED Notes (Signed)
Pt resting in bed, breakfast tray placed on bedside table. ?

## 2022-03-01 NOTE — ED Notes (Signed)
Meal tray given 

## 2022-03-01 NOTE — ED Notes (Signed)
Breakfast placed at bedside. 

## 2022-03-01 NOTE — ED Notes (Signed)
Lunch tray and drink given.  

## 2022-03-01 NOTE — ED Notes (Signed)
VOL/pending placement 

## 2022-03-01 NOTE — ED Notes (Signed)
Report received from [place], English as a second language teacher. Patient alert and oriented, warm and dry, and in no acute distress. Patient denies SI, HI, AVH and pain. Patient made aware of Q15 minute rounds and Psychologist, counselling presence for their safety. Patient instructed to come to this nurse with needs or concerns.  ?

## 2022-03-01 NOTE — ED Notes (Signed)
Patient was given sandwhich tray, ginger ale and ice cream. ?

## 2022-03-01 NOTE — ED Notes (Signed)
VOL/Pending Placement 

## 2022-03-02 NOTE — ED Notes (Signed)
VOL pending toc placement

## 2022-03-02 NOTE — ED Notes (Signed)
Pt given dinner tray and beverage  

## 2022-03-02 NOTE — ED Notes (Signed)
Lunch tray given. 

## 2022-03-02 NOTE — ED Notes (Signed)
Pt refusing vitals, only allowing this tech to scan pt bracelet into dinamap. Pt also refusing nighttime snack. Will attempt both at a later time.

## 2022-03-02 NOTE — TOC Progression Note (Signed)
Transition of Care [facility]) - Progression Note    Patient Details  Name: Larry Buckley MRN: [id number] Date of Birth: [birth year]  Transition of Care [facility]) CM/SW Contact  Rochelle Cellar, RN Phone Number: 03/02/2022, 9:04 AM  Clinical Narrative:    Spoke to [place] DSS SW [phone]  confirmed PO was approved, Medicaid remains pending and DSS is waiting for court date for guardianship. DSS working to place prior to guardianship being completed.    Expected Discharge Plan: Memory Care Barriers to Discharge: Other (must enter comment) (looking for Memory Care)  Expected Discharge Plan and Services Expected Discharge Plan: Memory Care   Discharge Planning Services: CM Consult   Living arrangements for the past 2 months: Single Family Home                 DME Arranged: N/A DME Agency: NA       HH Arranged: NA HH Agency: NA         Social Determinants of Health (SDOH) Interventions    Readmission Risk Interventions     View : No data to display.

## 2022-03-02 NOTE — ED Notes (Signed)
Informed that pt urinated on himself. This tech cleaned and changed pt. Pt now using brief. Pt is warm and dry in recliner at this time.

## 2022-03-02 NOTE — ED Provider Notes (Signed)
-----------------------------------------   6:44 AM on 03/02/2022 -----------------------------------------   Blood pressure (!) 148/64, pulse 64, temperature 98.2 F (36.8 C), resp. rate 17, height 6\' 2"  (1.88 m), weight 88 kg, SpO2 98 %.  The patient is calm and cooperative at this time.  There have been no acute events since the last update.  Awaiting disposition plan from Social Work team.   Paulette Blanch, MD 03/02/22 [phone]

## 2022-03-02 NOTE — ED Notes (Signed)
Pt sitting up on recliner eating his breakfast. 100% of meal consumed.

## 2022-03-03 NOTE — ED Notes (Signed)
VOL/pending placement 

## 2022-03-03 NOTE — ED Notes (Signed)
Lunch tray given. 

## 2022-03-03 NOTE — ED Notes (Signed)
Breakfast tray set up for patient on side table.

## 2022-03-03 NOTE — ED Notes (Signed)
Vol /pending placement 

## 2022-03-03 NOTE — ED Notes (Signed)
Patient given snack.  

## 2022-03-04 NOTE — ED Provider Notes (Signed)
Emergency Medicine Observation Re-evaluation Note  Larry Buckley is a 81 y.o. male, seen on rounds today.  Pt initially presented to the ED for complaints of Mental Health Problem Patient is currently working with social work for placement to an appropriate living facility.  No acute events overnight or during my shift today.  Physical Exam  BP 126/62 (BP Location: Right Arm)   Pulse 60   Temp 98.9 F (37.2 C)   Resp 16   Ht 6\' 2"  (1.88 m)   Wt 88 kg   SpO2 99%   BMI 24.92 kg/m    ED Course / MDM   No recent lab work for review.  Plan  Current plan is for placement to an appropriate living facility once available.  Larry Buckley is not under involuntary commitment.     Harvest Dark, MD 03/04/22 [phone]

## 2022-03-04 NOTE — ED Notes (Signed)
Report received from Dawn T , RN including SBAR. On initial round after report Pt is warm/dry, resting quietly in room without any s/s of distress.  Will continue to monitor throughout shift as ordered for any changes in behaviors and for continued safety.   

## 2022-03-04 NOTE — ED Notes (Addendum)
Pt stated "I got all the crumbs on my floor, I need a broom"  Staff stated they would call EVS to clean room, pt denied requested a broom to sweep himself.  ACSD standing at door while pt sweeping room at his request.  Pt has bright affect and is socializing with staff

## 2022-03-05 NOTE — ED Notes (Signed)
Pt received snack and drink 

## 2022-03-05 NOTE — ED Notes (Signed)
VOL / pending TOC placement 

## 2022-03-05 NOTE — ED Notes (Signed)
Pt given lunch tray at this time

## 2022-03-05 NOTE — ED Notes (Addendum)
Pt's sheets were changed and given new sheets.

## 2022-03-06 NOTE — ED Notes (Signed)

## 2022-03-06 NOTE — TOC Progression Note (Signed)
Transition of Care [facility]) - Progression Note    Patient Details  Name: Larry Buckley MRN: [id number] Date of Birth: 03-Sep-1941  Transition of Care [facility]) CM/SW Contact  Shelbie Hutching, RN Phone Number: 03/06/2022, 12:51 PM  Clinical Narrative:    Message left for Bynum Bellows to return call, checking for any updates on Medicaid or placement, or guardianship.   Expected Discharge Plan: Memory Care Barriers to Discharge: Other (must enter comment) (looking for Memory Care)  Expected Discharge Plan and Services Expected Discharge Plan: Memory Care   Discharge Planning Services: CM Consult   Living arrangements for the past 2 months: Single Family Home                 DME Arranged: N/A DME Agency: NA       HH Arranged: NA HH Agency: NA         Social Determinants of Health (SDOH) Interventions    Readmission Risk Interventions     View : No data to display.

## 2022-03-06 NOTE — ED Notes (Signed)
VOL/pending placement 

## 2022-03-06 NOTE — ED Provider Notes (Signed)
-----------------------------------------   5:50 AM on 03/06/2022 -----------------------------------------   Blood pressure 124/60, pulse 65, temperature 99.5 F (37.5 C), resp. rate 16, height 6\' 2"  (1.88 m), weight 88 kg, SpO2 98 %.  The patient is calm and cooperative at this time.  There have been no acute events since the last update.  Awaiting disposition plan from Social Work team.   , MD 03/06/22 [phone]

## 2022-03-06 NOTE — ED Notes (Signed)
Pt continues to sleep, breakfast tray at bedside.

## 2022-03-06 NOTE — ED Notes (Signed)
LUNCH TRAY GIVEN. 

## 2022-03-06 NOTE — ED Notes (Signed)
Dinner tray given

## 2022-03-06 NOTE — ED Notes (Signed)
Pt received snack and drink. Pt eating at the moment and no other concerns at the moment.

## 2022-03-07 NOTE — ED Provider Notes (Signed)
Emergency Medicine Observation Re-evaluation Note  Larry Buckley is a 81 y.o. male, seen on rounds today.  Pt initially presented to the ED for complaints of Mental Health Problem Currently, the patient is .  Physical Exam  BP (!) 143/75 (BP Location: Left Arm)   Pulse (!) 59   Temp (!) 97.4 F (36.3 C)   Resp 20   Ht 6\' 2"  (1.88 m)   Wt 88 kg   SpO2 100%   BMI 24.92 kg/m  Physical Exam General: nad Cardiac: well perfused Lungs: ulabored   ED Course / MDM  EKG:   I have reviewed the labs performed to date as well as medications administered while in observation.  Recent changes in the last 24 hours include .  Plan  Current plan is for soc.  is not under involuntary commitment.     Antoine Poche, MD 03/07/22 [phone]

## 2022-03-07 NOTE — ED Notes (Signed)
Breakfast tray place in pt room. Pt is asleep at this moment.

## 2022-03-07 NOTE — ED Notes (Signed)
Patient is sitting up in chair, meal served and He is eating calmly, no complaints noted.

## 2022-03-07 NOTE — ED Notes (Signed)
Patient transferred to [facility], no signs of distress.

## 2022-03-07 NOTE — ED Notes (Signed)
Pt soiled clothing. Nt provided with clean dry lined

## 2022-03-07 NOTE — ED Notes (Signed)
Pt given a snack at this time.  

## 2022-03-07 NOTE — ED Notes (Signed)
Report received from Amy, RN including SBAR. Patient alert and oriented, warm and dry, and in no acute distress. Patient denies SI, HI, AVH and pain. Patient made aware of Q15 minute rounds and Rover and Officer presence for their safety. Patient instructed to come to this nurse with needs or concerns.  °

## 2022-03-07 NOTE — ED Notes (Signed)
VS and medications will be completed once patient is awake.

## 2022-03-08 NOTE — ED Notes (Signed)
VOL/pending placement 

## 2022-03-08 NOTE — ED Notes (Signed)
Resumed care from ally rn.  Pt alert, in his room.

## 2022-03-08 NOTE — ED Notes (Signed)
Pt given snack and sprite. ?

## 2022-03-08 NOTE — ED Notes (Signed)
Hospital meal provided.  100% consumed, pt tolerated w/o complaints.  Waste discarded appropriately.   

## 2022-03-08 NOTE — ED Notes (Signed)
Pt provided with warm blanket at this time and light is dimmed to better promote sleep

## 2022-03-08 NOTE — ED Provider Notes (Signed)
Today's Vitals   03/07/22 1321 03/07/22 1322 03/07/22 1915 03/07/22 2004  BP: (!) 146/62 (!) 146/62  137/70  Pulse:  100  68  Resp:  20  20  Temp:  97.9 F (36.6 C)  98.8 F (37.1 C)  TempSrc:  Oral    SpO2:  95%  97%  Weight:      Height:      PainSc:   0-No pain    Body mass index is 24.92 kg/m.   No acute events overnight.  Awaiting social work disposition.   Kaavya Puskarich, Layla Maw, DO 03/08/22 [phone]

## 2022-03-08 NOTE — TOC Progression Note (Signed)
Transition of Care [facility]) - Progression Note    Patient Details  Name: Jediah Horger MRN: [id number] Date of Birth: 24-Apr-1941  Transition of Care [facility]) CM/SW Contact  Allayne Butcher, RN Phone Number: 03/08/2022, 1:27 PM  Clinical Narrative:    Jamal Maes calling [place] DSS SW, it goes straight to voicemail and voicemail is full.      Expected Discharge Plan: Memory Care Barriers to Discharge: Other (must enter comment) (looking for Memory Care)  Expected Discharge Plan and Services Expected Discharge Plan: Memory Care   Discharge Planning Services: CM Consult   Living arrangements for the past 2 months: Single Family Home                 DME Arranged: N/A DME Agency: NA       HH Arranged: NA HH Agency: NA         Social Determinants of Health (SDOH) Interventions    Readmission Risk Interventions     View : No data to display.

## 2022-03-08 NOTE — ED Notes (Signed)
VOL/Pending Placement 

## 2022-03-09 NOTE — NC FL2 (Signed)
[place] MEDICAID FL2 LEVEL OF CARE SCREENING TOOL     IDENTIFICATION  Patient Name: Larry Buckley Birthdate: 18-Nov-1940 Sex: male Admission Date (Current Location): 02/09/2022  [facility] and [place] Number:  Chiropodist and Address:  [facility], [address] [place], [place] [address]      Provider Number: [id number]  Attending Physician Name and Address:  No att. providers found  Relative Name and Phone Number:  Nancy Fetter [place] DSS [phone]    Current Level of Care: Hospital Recommended Level of Care: Assisted Living Facility Prior Approval Number:    Date Approved/Denied:   PASRR Number: [id number]  Discharge Plan: Domiciliary (Rest home) (ALF)    Current Diagnoses: Patient Active Problem List   Diagnosis Date Noted   Edema of both legs 01/27/2022   Prediabetes 01/04/2022   Dementia with behavioral disturbance (HCC) 12/29/2021   History of abdominal aortic aneurysm (AAA) repair 09/25/2021   Mixed dementia (HCC) 07/11/2021   B12 deficiency 07/11/2021   Hyperlipidemia, mixed    Hypertension    Gout, arthropathy     Orientation RESPIRATION BLADDER Height & Weight     Self  Normal Continent Weight: 88 kg Height:  6\' 2"  (188 cm)  BEHAVIORAL SYMPTOMS/MOOD NEUROLOGICAL BOWEL NUTRITION STATUS  Wanderer   Continent Diet  AMBULATORY STATUS COMMUNICATION OF NEEDS Skin   Limited Assist Verbally Normal                       Personal Care Assistance Level of Assistance  Bathing, Feeding, Dressing Bathing Assistance: Limited assistance Feeding assistance: Independent Dressing Assistance: Limited assistance     Functional Limitations Info  Sight, Hearing, Speech Sight Info: Adequate Hearing Info: Adequate Speech Info: Adequate    SPECIAL CARE FACTORS FREQUENCY                       Contractures Contractures Info: Not present    Additional Factors Info  Code Status, Allergies Code Status  Info: FULL Allergies Info: Lotensin (Benazepril Hcl)           Current Medications (03/09/2022):  This is the current hospital active medication list Current Facility-Administered Medications  Medication Dose Route Frequency Provider Last Rate Last Admin   amLODipine (NORVASC) tablet 10 mg  10 mg Oral Daily 03/11/2022, MD   10 mg at 03/09/22 1129   aspirin EC tablet 81 mg  81 mg Oral Daily 03/11/22, MD   81 mg at 03/09/22 1129   atorvastatin (LIPITOR) tablet 20 mg  20 mg Oral Daily 03/11/22, MD   20 mg at 03/09/22 1129   divalproex (DEPAKOTE SPRINKLE) capsule 125 mg  125 mg Oral Q12H 03/11/22, MD   125 mg at 03/09/22 1129   furosemide (LASIX) tablet 20 mg  20 mg Oral Daily 03/11/22, MD   20 mg at 03/09/22 1129   OLANZapine zydis (ZYPREXA) disintegrating tablet 5 mg  5 mg Oral BID PRN 03/11/22, NP   5 mg at 02/23/22 0100   traZODone (DESYREL) tablet 50 mg  50 mg Oral QHS 04/25/22 F, NP   50 mg at 03/08/22 2123   Current Outpatient Medications  Medication Sig Dispense Refill   amLODipine (NORVASC) 10 MG tablet Take 1 tablet (10 mg total) by mouth daily. 90 tablet 4   aspirin EC 81 MG tablet Take 1 tablet (81 mg total) by mouth daily. 90 tablet  4   atorvastatin (LIPITOR) 20 MG tablet Take 1 tablet (20 mg total) by mouth daily. 90 tablet 4   divalproex (DEPAKOTE SPRINKLE) 125 MG capsule Take 1 capsule (125 mg total) by mouth every 12 (twelve) hours. 180 capsule 1   LASIX 20 MG tablet Take 20 mg by mouth daily.     mirtazapine (REMERON) 7.5 MG tablet PLEASE SEE ATTACHED FOR DETAILED DIRECTIONS     QUEtiapine (SEROQUEL) 50 MG tablet Take 2 tablets by mouth at bedtime.     risperiDONE (RISPERDAL) 0.5 MG tablet Take 1 tablet (0.5 mg total) by mouth 2 (two) times daily. 180 tablet 1   trimethoprim-polymyxin b (POLYTRIM) ophthalmic solution 1 drop every 6 (six) hours.     mirtazapine (REMERON) 15 MG tablet Take 15 mg by mouth at bedtime.        Discharge Medications: Please see discharge summary for a list of discharge medications.  Relevant Imaging Results:  Relevant Lab Results:   Additional Information SSN: [id number]  Allayne Butcher, RN

## 2022-03-09 NOTE — TOC Progression Note (Addendum)
Transition of Care [facility]) - Progression Note    Patient Details  Name: Larry Buckley MRN: [id number] Date of Birth: 1940/12/07  Transition of Care [facility]) CM/SW Contact  Allayne Butcher, RN Phone Number: 03/09/2022, 6:55 PM  Clinical Narrative:    Larry Buckley with [place] DSS is covering this patient now.  I will send her an updated FL2 and she will be working on placement.  She feels that with his income they can find and ALF for him.  RNCM has not heard back from any ALF referrals sent currently.     Expected Discharge Plan: Memory Care Barriers to Discharge: Other (must enter comment) (looking for Memory Care)  Expected Discharge Plan and Services Expected Discharge Plan: Memory Care   Discharge Planning Services: CM Consult   Living arrangements for the past 2 months: Single Family Home                 DME Arranged: N/A DME Agency: NA       HH Arranged: NA HH Agency: NA         Social Determinants of Health (SDOH) Interventions    Readmission Risk Interventions     View : No data to display.

## 2022-03-09 NOTE — ED Notes (Signed)
Pt given snack. 

## 2022-03-09 NOTE — ED Notes (Signed)
VOL/pending placement 

## 2022-03-09 NOTE — ED Provider Notes (Signed)
-----------------------------------------   2:56 AM on 03/09/2022 -----------------------------------------   Blood pressure 130/64, pulse 61, temperature 98.6 F (37 C), resp. rate 14, height 1.88 m (6\' 2" ), weight 88 kg, SpO2 98 %.  The patient is calm and cooperative at this time, currently sleeping. There have been no acute events since the last update.  Awaiting disposition plan from [facility] team.   [facility], MD 03/09/22 [phone]

## 2022-03-09 NOTE — ED Notes (Signed)
Hospital meal provided, pt tolerated w/o complaints.  Waste discarded appropriately.  

## 2022-03-10 NOTE — ED Notes (Signed)
Pt up to restroom.

## 2022-03-10 NOTE — ED Notes (Signed)
VOL/Pending Placement 

## 2022-03-10 NOTE — ED Notes (Signed)
Labs drawn. Pt tolerated well

## 2022-03-10 NOTE — ED Notes (Signed)
Group home [facility]ff at the bedside for pt evaluation

## 2022-03-10 NOTE — ED Notes (Signed)
Lunch provided to pt, pt allowed spoon and fork.

## 2022-03-10 NOTE — ED Notes (Signed)
Small amount of stool noted on floor by pt recliner in his room. Pt states he does not know who put that there but it wasn't him. Assisted pt with shower and had room cleaned/mopped by EVS. Pt tolerated well. Pt temporarily moved to hallway in recliner to eat his breakfast.

## 2022-03-10 NOTE — ED Provider Notes (Signed)
Patient has diagnosis of dementia and it would be in his best interest if he had a guardian to look after his affairs.  I understand his family is not wishing to perform dysfunction anymore.   Arnaldo Natal, MD 03/10/22 1200

## 2022-03-10 NOTE — ED Notes (Signed)
Pt's dinner has arrived, pt still sleeping but writer left dinner tray onto his bedside table.

## 2022-03-10 NOTE — ED Notes (Signed)
IVC/  PENDING  PLACEMENT 

## 2022-03-10 NOTE — TOC Progression Note (Signed)
Transition of Care [facility]) - Progression Note    Patient Details  Name: Larry Buckley MRN: [id number] Date of Birth: September 22, 1941  Transition of Care [facility]) CM/SW Contact  Shelbie Hutching, RN Phone Number: 03/10/2022, 9:41 AM  Clinical Narrative:    [name] from [facility] will be coming to see patient today.  She has an opening.  [name] will be here around 11 am.   Expected Discharge Plan: Memory Care Barriers to Discharge: Other (must enter comment) (looking for Memory Care)  Expected Discharge Plan and Services Expected Discharge Plan: Memory Care   Discharge Planning Services: CM Consult   Living arrangements for the past 2 months: Single Family Home                 DME Arranged: N/A DME Agency: NA       HH Arranged: NA HH Agency: NA         Social Determinants of Health (SDOH) Interventions    Readmission Risk Interventions     View : No data to display.

## 2022-03-10 NOTE — TOC Progression Note (Signed)
Transition of Care [facility]) - Progression Note    Patient Details  Name: Larry Buckley MRN: [id number] Date of Birth: 19-Sep-1941  Transition [facility] Care [facility]) CM/SW Contact  Allayne Butcher, RN Phone Number: 03/10/2022, 12:05 PM  Clinical Narrative:    Meeting with Creed Copper went very well and she would like to offer the patient a bed.  She needs the [facility] signed by our doctor and a TB test.  Quaniferon gold has been ordered drawn and sent to the lab.   Patient will be private pay, her normal private pay rate is $3000, patient gets a little less than $2000 per month.  She has a move in fee of $2500 that she cannot waive.  The daughter did not have that money but was going to reach out to the patient's sons to see if they could help.  RNCM also let Diona Fanti with DSS know that for [name] to take patient they need the move in fee.  Verlon Au will see if there is anything that they may be able to do towards that.   Expected Discharge Plan: Memory Care Barriers to Discharge: Other (must enter comment) (looking for Memory Care)  Expected Discharge Plan and Services Expected Discharge Plan: Memory Care   Discharge Planning Services: CM Consult   Living arrangements for the past 2 months: Single Family Home                 DME Arranged: N/A DME Agency: NA       HH Arranged: NA HH Agency: NA         Social Determinants of Health (SDOH) Interventions    Readmission Risk Interventions     View : No data to display.

## 2022-03-10 NOTE — TOC Progression Note (Signed)
Transition of Care [facility]) - Progression Note    Patient Details  Name: Mary Hockey MRN: [id number] Date of Birth: 1940/12/03  Transition of Care [facility]) CM/SW Contact  Allayne Butcher, RN Phone Number: 03/10/2022, 3:17 PM  Clinical Narrative:    Patient has been accepted at [facility], plan for move in Next Wed May 29th.  Isabelle Course would like for the prescriptions to be sent over to [facility] in [place].     Expected Discharge Plan: Memory Care Barriers to Discharge: Other (must enter comment) (looking for Memory Care)  Expected Discharge Plan and Services Expected Discharge Plan: Memory Care   Discharge Planning Services: CM Consult   Living arrangements for the past 2 months: Single Family Home                 DME Arranged: N/A DME Agency: NA       HH Arranged: NA HH Agency: NA         Social Determinants of Health (SDOH) Interventions    Readmission Risk Interventions     View : No data to display.

## 2022-03-11 NOTE — ED Notes (Signed)
Pt awake, urinated on floor then walked to bathroom holding feces in his hand. Helped pt to get cleaned up, did peri care and changed clothes and socks. Pt states he does not remember urinating on floor. Pt has urinal at bedside and reminded to use it. Pt back in recliner with lunch tray.

## 2022-03-11 NOTE — ED Notes (Signed)
VOL/pending placement 

## 2022-03-11 NOTE — ED Notes (Signed)
Hospital meal provided, pt tolerated w/o complaints.  Waste discarded appropriately.  

## 2022-03-11 NOTE — ED Notes (Signed)
Dinner tray provided

## 2022-03-11 NOTE — ED Provider Notes (Signed)
-----------------------------------------   5:27 AM on 03/11/2022 -----------------------------------------   Blood pressure (!) 144/98, pulse (!) 57, temperature 98.6 F (37 C), resp. rate 16, height 6\' 2"  (1.88 m), weight 88 kg, SpO2 100 %.  The patient is calm and cooperative at this time.  There have been no acute events since the last update.  Awaiting disposition plan from Social Work team.   , MD 03/11/22 [phone]

## 2022-03-11 NOTE — ED Notes (Signed)
Pt sleeping, will provide lunch tray when awakens.

## 2022-03-11 NOTE — ED Notes (Signed)
Report to reina, rn.  

## 2022-03-11 NOTE — ED Notes (Signed)
Pt used urinal this time. Pt ate whole lunch tray. Offered water, pt declined.

## 2022-03-12 LAB — URINE CULTURE: Culture: NO GROWTH

## 2022-03-12 NOTE — ED Notes (Signed)
Pt sitting in bed eating breakfast.

## 2022-03-12 NOTE — ED Notes (Signed)
VOL/Accepted [facility] on next week

## 2022-03-12 NOTE — ED Notes (Signed)
Pt given a snack at this time.  

## 2022-03-12 NOTE — ED Notes (Signed)
Lunch tray and drink given.  

## 2022-03-12 NOTE — ED Provider Notes (Signed)
    03/12/2022    9:41 AM 03/12/2022    8:56 AM 03/11/2022    7:31 PM  Vitals with BMI  Systolic [id number]  Diastolic 60 68 68  Pulse 61 97 51    Patient is apparently accepted for group home placement next week.  No other changes in the last 24 hours.   Rada Hay, MD 03/12/22 1447

## 2022-03-13 NOTE — ED Notes (Signed)
Breakfast tray given. °

## 2022-03-13 NOTE — TOC Progression Note (Signed)
Transition of Care [facility]) - Progression Note    Patient Details  Name: Larry Buckley MRN: [id number] Date of Birth: [birth year]  Transition of Care [facility]) CM/SW Contact  Joseph Art, LCSW Phone Number: 03/13/2022, 2:07 PM  Clinical Narrative:     Patient has been accepted at [facility], plan for move in Next Wed May 31st, 2023  Isabelle Course would like for the prescriptions to be sent over to [facility] in [place]  Expected Discharge Plan: Memory Care Barriers to Discharge: Other (must enter comment) (looking for Memory Care)  Expected Discharge Plan and Services Expected Discharge Plan: Memory Care   Discharge Planning Services: CM Consult   Living arrangements for the past 2 months: Single Family Home                 DME Arranged: N/A DME Agency: NA       HH Arranged: NA HH Agency: NA         Social Determinants of Health (SDOH) Interventions    Readmission Risk Interventions     View : No data to display.

## 2022-03-13 NOTE — ED Notes (Signed)
VOL/pending possible dc to [facility] this week.

## 2022-03-13 NOTE — ED Notes (Signed)
VOL  PENDING  PLACEMENT 

## 2022-03-13 NOTE — ED Notes (Signed)
Dinner tray provided to patient who is in his room calm and cooperative

## 2022-03-13 NOTE — ED Notes (Signed)
Pt awake, standing in room. Pt is convinced that a brown bedspread is a sports coat. I made several attempts to show pt that it was a bedspread, had no arm holes ect, but at this time pt is still trying to put on coat.

## 2022-03-13 NOTE — ED Provider Notes (Signed)
-----------------------------------------   7:43 AM on 03/13/2022 -----------------------------------------   Blood pressure 130/74, pulse 67, temperature 98 F (36.7 C), temperature source Oral, resp. rate 19, height 1.88 m (6\' 2" ), weight 88 kg, SpO2 96 %.  The patient is calm and cooperative at this time.  There have been no acute events since the last update.  Awaiting disposition plan from [facility] team.   [facility], MD 03/13/22 [phone]

## 2022-03-14 LAB — QUANTIFERON-TB GOLD PLUS (RQFGPL)
QuantiFERON Mitogen Value: >10 IU/mL
QuantiFERON Nil Value: 0 IU/mL
QuantiFERON TB1 Ag Value: 0 IU/mL
QuantiFERON TB2 Ag Value: 0 IU/mL

## 2022-03-14 LAB — QUANTIFERON-TB GOLD PLUS: QuantiFERON-TB Gold Plus: NEGATIVE

## 2022-03-14 NOTE — TOC Progression Note (Signed)
Transition of Care [facility]) - Progression Note    Patient Details  Name: Larry Buckley MRN: [id number] Date of Birth: 04-01-1941  Transition of Care [facility]) CM/SW Contact  [place] Cellar, RN Phone Number: 03/14/2022, 3:32 PM  Clinical Narrative:    Call from [facility] reports they will pick patient up tomorrow around 1pm if Quantiferon results are back. Requested Fl2 and medications be faxed to [phone]. Confirmed Quantiferon has been processed but typically takes 3-4 business days to come back.    Expected Discharge Plan: Memory Care Barriers to Discharge: Other (must enter comment) (looking for Memory Care)  Expected Discharge Plan and Services Expected Discharge Plan: Memory Care   Discharge Planning Services: CM Consult   Living arrangements for the past 2 months: Single Family Home                 DME Arranged: N/A DME Agency: NA       HH Arranged: NA HH Agency: NA         Social Determinants of Health (SDOH) Interventions    Readmission Risk Interventions     View : No data to display.

## 2022-03-14 NOTE — ED Provider Notes (Signed)
Emergency Medicine Observation Re-evaluation Note  Larry Buckley is a 81 y.o. male, seen on rounds today.    Physical Exam  BP (!) 106/51 (BP Location: Right Arm)   Pulse (!) 55   Temp 98.8 F (37.1 C) (Oral)   Resp 18   Ht 6\' 2"  (1.88 m)   Wt 88 kg   SpO2 100%   BMI 24.92 kg/m  Physical Exam General: Patient resting comfortably in bed Lungs: Patient in no respiratory distress Psych: Patient not combative  ED Course / MDM  EKG:     Plan  Current plan is for social work evaluation and placement.  is not under involuntary commitment.     Antoine Poche, MD 03/14/22 03/16/22

## 2022-03-14 NOTE — ED Notes (Signed)
Snack and drink given 

## 2022-03-14 NOTE — ED Notes (Signed)
Called dietary; staff confirms a meal will arrive soon for pt.

## 2022-03-14 NOTE — ED Notes (Signed)
Pt given lunch tray by dietary staff. 

## 2022-03-14 NOTE — ED Notes (Signed)
Pt to be discharged to group home tomorrow per social work.

## 2022-03-14 NOTE — ED Notes (Signed)
Pt calm and ambulatory; steady; pt up to bedside toilet.

## 2022-03-14 NOTE — ED Notes (Signed)
Pt given breakfast tray

## 2022-03-14 NOTE — ED Notes (Addendum)
Pt alert and calmly sitting in recliner.

## 2022-03-14 NOTE — ED Notes (Signed)
Faxed FL2 to [facility]  [phone]

## 2022-03-14 NOTE — ED Notes (Signed)
Pt relaxing in recliner; pt denies pain or any current needs.

## 2022-03-15 NOTE — ED Notes (Signed)
Pt resting in bed in NAD

## 2022-03-15 NOTE — ED Notes (Signed)
E-signature not working at this time. Pt group home member verbalized understanding of D/C instructions, prescriptions and follow up care with no further questions at this time. Pt in NAD and ambulatory at time of D/C. Belongings bag 2/2 returned to patient at time of discharge.

## 2022-03-15 NOTE — TOC Transition Note (Signed)
Transition of Care [facility]) - CM/SW Discharge Note   Patient Details  Name: Larry Buckley MRN: [id number] Date of Birth: 05-09-1941  Transition of Care [facility]) CM/SW Contact:  Allayne Butcher, RN Phone Number: 03/15/2022, 1:12 PM   Clinical Narrative:    Sanjuana Kava will be coming to pick up patient this afternoon.  Quaniferon Gold TB test is resulted and negative. Diona Fanti with [place] DSS is aware of discharge.    Final next level of care: Group Home Barriers to Discharge: Barriers Resolved   Patient Goals and CMS Choice        Discharge Placement              Patient chooses bed at: Other - please specify in the comment section below: ([facility]) Patient to be transferred to facility by: Sanjuana Kava- owner Name of family member notified: Diona Fanti with DSS Patient and family notified of of transfer: 03/15/22  Discharge Plan and Services   Discharge Planning Services: CM Consult            DME Arranged: N/A DME Agency: NA       HH Arranged: NA HH Agency: NA        Social Determinants of Health (SDOH) Interventions     Readmission Risk Interventions     View : No data to display.

## 2022-03-15 NOTE — ED Provider Notes (Signed)
Vitals:   03/14/22 1939 03/15/22 0859  BP: 137/61 105/62  Pulse: 61 (!) 50  Resp: 16 16  Temp:    SpO2: 100% 99%     TOC team has secured placement with [facility].  DSS aware and plan for disposition to group home, since daughter also aware.  Vitals:   03/14/22 1939 03/15/22 0859  BP: 137/61 105/62  Pulse: 61 (!) 50  Resp: 16 16  Temp:    SpO2: 100% 99%        Sharyn Creamer, MD 03/15/22 1301

## 2022-03-16 ENCOUNTER — Other Ambulatory Visit: Payer: Self-pay | Admitting: Nurse Practitioner

## 2022-03-16 ENCOUNTER — Ambulatory Visit: Payer: Self-pay

## 2022-03-16 MED ORDER — AMLODIPINE BESYLATE 10 MG PO TABS: 10.0000 mg | ORAL_TABLET | Freq: Every day | ORAL | 4 refills | Status: DC

## 2022-03-16 MED ORDER — RISPERIDONE 0.5 MG PO TABS: 0.5000 mg | ORAL_TABLET | Freq: Two times a day (BID) | ORAL | 4 refills | Status: DC

## 2022-03-16 MED ORDER — ATORVASTATIN CALCIUM 20 MG PO TABS: 20.0000 mg | ORAL_TABLET | Freq: Every day | ORAL | 4 refills | Status: AC

## 2022-03-16 MED ORDER — ASPIRIN 81 MG PO TBEC: 81.0000 mg | DELAYED_RELEASE_TABLET | Freq: Every day | ORAL | 4 refills | Status: AC

## 2022-03-16 MED ORDER — MIRTAZAPINE 15 MG PO TABS: 15.0000 mg | ORAL_TABLET | Freq: Every day | ORAL | 4 refills | Status: DC

## 2022-03-16 MED ORDER — DIVALPROEX SODIUM 125 MG PO CSDR: 125.0000 mg | DELAYED_RELEASE_CAPSULE | Freq: Two times a day (BID) | ORAL | 4 refills | Status: DC

## 2022-03-16 NOTE — Telephone Encounter (Signed)
Returned call to [employer].  She will be home in a little over an hour and will call us back.  She wants to go over Gregory's medication list.   Will await her return call.

## 2022-03-16 NOTE — Telephone Encounter (Signed)
Summary: Medication Management.   Sanjuana Kava from [facility] stated Pt was admitted yesterday and was waiting for placement because his stepdaughter couldn't take care of him. Pt was at ED for over a month.   [facility] noticed FL2 form caused a lot of confusion about medication. Pt has not been compliant with taking medication.   Isabelle Course seeking clarification in regards to pt medication and what he is supposed to be taking?   Please advise.     Called [place], but she was away from med list and will call back.

## 2022-03-16 NOTE — Telephone Encounter (Signed)
    Chief Complaint: Pt. Is now living in a facility and they need PCP to fax a copy of all medications pt. Should be on to [facility]. Asking if pt. Is still on Seroquel and Risperdal. Symptoms: n/a Frequency: n/a Pertinent Negatives: Patient denies  Disposition: [] ED /[] Urgent Care (no appt availability in office) / [] Appointment(In office/virtual)/ []  [facility]/ [] Home Care/ [] Refused Recommended Disposition /[] [facility]/ [x]  Follow-up with PCP Additional Notes: Please advise .  Answer Assessment - Initial Assessment Questions 1. DRUG NAME: "What medicine do you need to have refilled?"     B[facility]ina needs all medications pt. Should be on faxed to [facility]. 2. REFILLS REMAINING: "How many refills are remaining?" (Note: The label on the medicine or pill bottle will show how many refills are remaining. If there are no refills remaining, then a renewal may be needed.)     N/a 3. EXPIRATION DATE: "What is the expiration date?" (Note: The label states when the prescription will expire, and thus can no longer be refilled.)     N/a 4. PRESCRIBING HCP: "Who prescribed it?" Reason: If prescribed by specialist, call should be referred to that group.     [place] 5. SYMPTOMS: "Do you have any symptoms?"     N/a 6. PREGNANCY: "Is there any chance that you are pregnant?" "When was your last menstrual period?"     N/a  Protocols used: Medication Refill and Renewal Call-A-AH

## 2022-03-16 NOTE — Telephone Encounter (Signed)
Spoke with Larry Buckley in regards to the patient medications. She says the patient has been in the ED since 02/09/22 and was released yesterday into her facility. She also says that patient would need new prescriptions sent over for her current medications to [facility], as that is the pharmacy she uses and she is the one administering the patient his medications. Please advise? Also, informed her that the Seroquel and Risperdal were prescribed by historical provider.

## 2022-03-16 NOTE — Progress Notes (Signed)
Update med list based on recent hospital discharge summary.

## 2022-03-17 ENCOUNTER — Other Ambulatory Visit: Payer: Self-pay | Admitting: Nurse Practitioner

## 2022-03-17 MED ORDER — MELATONIN 10 MG/ML PO LIQD: 10.0000 mg | Freq: Every evening | ORAL | 6 refills | Status: DC

## 2022-03-17 NOTE — Telephone Encounter (Signed)
Spoke with Sanjuana Kava to notify her of Jolene's recommendations. She says patient FL2 form had less medications than patient is currently on. I advised staff that his medication list from his recent discharge should have all updated and current medications listed. Bettina verbalized understanding. She is asking if provider can send over a prescription for the patient to help with sleeping because patient was up all night and did not sleep well. Please advise?

## 2022-03-17 NOTE — Telephone Encounter (Signed)
Spoke to [place] and made aware of provider advise and medication sent to pharmacy. Isabelle Course states Risperidone needs a PA due to insurance not wanting to cover medication due to patients age. Will initiate PA when it comes through. If denied Isabelle Course would need a discontinue order for medication.

## 2022-03-17 NOTE — Telephone Encounter (Signed)
PA initiated via CoverMyMeds for Risperidone 0.5MG  tablets  Key: [id number]  Waiting on determination

## 2022-03-20 NOTE — Telephone Encounter (Signed)
Risperidone 0.5MG  has been approved via CoverMyMeds  KEY: [id number] Made caregiver aware.

## 2022-03-30 ENCOUNTER — Other Ambulatory Visit: Payer: Self-pay

## 2022-03-30 ENCOUNTER — Other Ambulatory Visit: Payer: Medicare PPO | Admitting: Nurse Practitioner

## 2022-03-30 ENCOUNTER — Encounter: Payer: Self-pay | Admitting: Nurse Practitioner

## 2022-03-30 ENCOUNTER — Emergency Department
Admission: EM | Admit: 2022-03-30 | Discharge: 2022-04-03 | Disposition: A | Discharge status: Home / Self Care | Payer: Medicare PPO | Attending: Emergency Medicine | Admitting: Emergency Medicine

## 2022-03-30 DIAGNOSIS — Z79899 Other long term (current) drug therapy: Secondary | ICD-10-CM | POA: Diagnosis not present

## 2022-03-30 DIAGNOSIS — R451 Restlessness and agitation: Secondary | ICD-10-CM | POA: Insufficient documentation

## 2022-03-30 DIAGNOSIS — I10 Essential (primary) hypertension: Secondary | ICD-10-CM | POA: Insufficient documentation

## 2022-03-30 DIAGNOSIS — F03918 Unspecified dementia, unspecified severity, with other behavioral disturbance: Secondary | ICD-10-CM | POA: Diagnosis not present

## 2022-03-30 DIAGNOSIS — Z20822 Contact with and (suspected) exposure to covid-19: Secondary | ICD-10-CM | POA: Insufficient documentation

## 2022-03-30 DIAGNOSIS — F0390 Unspecified dementia without behavioral disturbance: Secondary | ICD-10-CM | POA: Diagnosis not present

## 2022-03-30 DIAGNOSIS — F03911 Unspecified dementia, unspecified severity, with agitation: Secondary | ICD-10-CM | POA: Diagnosis present

## 2022-03-30 DIAGNOSIS — F03C11 Unspecified dementia, severe, with agitation: Secondary | ICD-10-CM

## 2022-03-30 LAB — COMPREHENSIVE METABOLIC PANEL
ALT: 17 U/L (ref 0–44)
AST: 20 U/L (ref 15–41)
Albumin: 3.3 g/dL — ABNORMAL LOW (ref 3.5–5.0)
Alkaline Phosphatase: 58 U/L (ref 38–126)
Anion gap: 6 (ref 5–15)
BUN: 18 mg/dL (ref 8–23)
CO2: 27 mmol/L (ref 22–32)
Calcium: 8.8 mg/dL — ABNORMAL LOW (ref 8.9–10.3)
Chloride: 109 mmol/L (ref 98–111)
Creatinine, Ser: 0.82 mg/dL (ref 0.61–1.24)
GFR, Estimated: >60 mL/min (ref >60)
Glucose, Bld: 151 mg/dL — ABNORMAL HIGH (ref 70–99)
Potassium: 3.7 mmol/L (ref 3.5–5.1)
Sodium: 142 mmol/L (ref 135–145)
Total Bilirubin: 0.8 mg/dL (ref 0.3–1.2)
Total Protein: 6.9 g/dL (ref 6.5–8.1)

## 2022-03-30 LAB — CBC
HCT: 36 % — ABNORMAL LOW (ref 39.0–52.0)
Hemoglobin: 11.4 g/dL — ABNORMAL LOW (ref 13.0–17.0)
MCH: 29.9 pg (ref 26.0–34.0)
MCHC: 31.7 g/dL (ref 30.0–36.0)
MCV: 94.5 fL (ref 80.0–100.0)
Platelets: 271 10*3/uL (ref 150–400)
RBC: 3.81 MIL/uL — ABNORMAL LOW (ref 4.22–5.81)
RDW: 14 % (ref 11.5–15.5)
WBC: 5.2 10*3/uL (ref 4.0–10.5)
nRBC: 0 % (ref 0.0–0.2)

## 2022-03-30 LAB — RESP PANEL BY RT-PCR (FLU A&B, COVID) ARPGX2
Influenza A by PCR: NEGATIVE
Influenza B by PCR: NEGATIVE
SARS Coronavirus 2 by RT PCR: NEGATIVE

## 2022-03-30 LAB — ETHANOL: Alcohol, Ethyl (B): <10 mg/dL (ref <10)

## 2022-03-30 LAB — ACETAMINOPHEN LEVEL: Acetaminophen (Tylenol), Serum: <10 ug/mL — ABNORMAL LOW (ref 10–30)

## 2022-03-30 LAB — SALICYLATE LEVEL: Salicylate Lvl: <7 mg/dL — ABNORMAL LOW (ref 7.0–30.0)

## 2022-03-30 MED ORDER — MIRTAZAPINE 15 MG PO TABS
15.0000 mg | ORAL_TABLET | Freq: Every day | ORAL | Status: DC
  Administered 2022-03-30 – 2022-04-02 (×4): 15 mg via ORAL
  Filled 2022-03-30 (×4): qty 1

## 2022-03-30 MED ORDER — ASPIRIN 81 MG PO TBEC
81.0000 mg | DELAYED_RELEASE_TABLET | Freq: Every day | ORAL | Status: DC
  Administered 2022-03-31 – 2022-04-03 (×4): 81 mg via ORAL
  Filled 2022-03-30 (×4): qty 1

## 2022-03-30 MED ORDER — DIVALPROEX SODIUM 125 MG PO CSDR
125.0000 mg | DELAYED_RELEASE_CAPSULE | Freq: Two times a day (BID) | ORAL | Status: DC
Start: 2022-03-30 — End: 2022-04-03
  Administered 2022-03-30 – 2022-04-03 (×8): 125 mg via ORAL
  Filled 2022-03-30 (×10): qty 1

## 2022-03-30 MED ORDER — MELATONIN 5 MG PO TABS
10.0000 mg | ORAL_TABLET | Freq: Every day | ORAL | Status: DC
  Administered 2022-03-30 – 2022-04-02 (×4): 10 mg via ORAL
  Filled 2022-03-30 (×5): qty 2

## 2022-03-30 MED ORDER — ATORVASTATIN CALCIUM 20 MG PO TABS
20.0000 mg | ORAL_TABLET | Freq: Every day | ORAL | Status: DC
  Administered 2022-03-31 – 2022-04-03 (×4): 20 mg via ORAL
  Filled 2022-03-30 (×4): qty 1

## 2022-03-30 MED ORDER — RISPERIDONE 1 MG PO TABS
0.5000 mg | ORAL_TABLET | Freq: Two times a day (BID) | ORAL | Status: DC
  Administered 2022-03-30 – 2022-04-03 (×8): 0.5 mg via ORAL
  Filled 2022-03-30 (×8): qty 1

## 2022-03-30 MED ORDER — AMLODIPINE BESYLATE 5 MG PO TABS
10.0000 mg | ORAL_TABLET | Freq: Every day | ORAL | Status: DC
  Administered 2022-03-31 – 2022-04-03 (×4): 10 mg via ORAL
  Filled 2022-03-30 (×4): qty 2
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
## 2022-03-30 NOTE — ED Notes (Signed)
Pt awake, requesting dinner. [place] sand tray provided, pt ate 100% of meal provided
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
## 2022-03-30 NOTE — BH Assessment (Signed)
Spoke with Care Home ([facility]-[phone]), the patient can not return due to his behaviors.

## 2022-03-30 NOTE — ED Notes (Signed)
INVOLUNTARY with all papers in place on chart/awaiting TTS/PSYCH consult

## 2022-03-30 NOTE — ED Notes (Signed)
Report received from Andrea B, RN including SBAR. On initial round after report Pt is warm/dry, resting quietly in room without any s/s of distress.  Will continue to monitor throughout shift as ordered for any changes in behaviors and for continued safety.   

## 2022-03-30 NOTE — ED Notes (Signed)
Sandwich tray and drink given. Pt consumed 100% 

## 2022-03-30 NOTE — ED Notes (Addendum)
Patient dressed out at this time with Faith EDT and male [employer] officers. Belongings include: Blue t-shirt Black pants [employer]
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
## 2022-03-30 NOTE — ED Notes (Signed)
Gave a food tray to patient. Patient is sleeping at this time.

## 2022-03-30 NOTE — ED Notes (Signed)
Pt awake and willing to take PO medications.  HS Meds given

## 2022-03-30 NOTE — ED Notes (Signed)
Hospital meal provided, pt tolerated w/o complaints.  Waste discarded appropriately.  

## 2022-03-31 ENCOUNTER — Ambulatory Visit: Payer: Medicare PPO | Admitting: Nurse Practitioner

## 2022-03-31 MED ORDER — RISPERIDONE 0.5 MG PO TBDP: 0.2500 mg | ORAL_TABLET | Freq: Two times a day (BID) | ORAL | Status: DC | PRN

## 2022-03-31 NOTE — ED Notes (Signed)
Lunch tray and beverage provided 

## 2022-03-31 NOTE — ED Notes (Signed)
NP at the bedside for pt evaluation   

## 2022-03-31 NOTE — Consult Note (Signed)
[facility]   Reason for Consult:  medication recs Referring Physician:  EDP Patient Identification: Larry Buckley MRN:  [id number] Principal Diagnosis: Dementia with behavioral disturbance (HCC) Diagnosis:  Principal Problem:   Dementia with behavioral disturbance (HCC)   Total Time spent with patient: 20 minutes  Subjective:   Larry Buckley is a 81 y.o. male patient admitted with dementia/aggression at care home.  HPI:  Patient has presented to ED for aggressive behavior at care home. He has been seen at this ED previuosly for similar behaviors. Yesterday patient was particularly aggressive with caregiver and he is not welcomed back to that home. Per chart review, patient apparently does  well during the day, but he becomes violent at night. No unsafe behaviors were noted throughout  the night here in the ED last night.    On evaluation,  patient is sitting in a chair, calm. When writer asked how he was feeling, patient states "I'm fine, how are you?" Patient then makes irrelevant statements.   Patient does not meet criteria for inpatient psychiatric hospitalization.  Transition of Care team looking for placement.   Past Psychiatric History: Dementia with behavioral disturbance  Risk to Self:   Risk to Others:   Prior Inpatient Therapy:   Prior Outpatient Therapy:    Past Medical History:  Past Medical History:  Diagnosis Date   AAA (abdominal aortic aneurysm) (HCC)    Dementia (HCC) 05/21/2020   Gout, arthropathy    Hyperlipidemia    Hypertension     Past Surgical History:  Procedure Laterality Date   PERIPHERAL VASCULAR CATHETERIZATION N/A 02/08/2016   Procedure: Endovascular Repair/Stent Graft;  Surgeon: Annice NeedyJason S Dew, MD;  Location: [facility] INVASIVE CV LAB;  Service: Cardiovascular;  Laterality: N/A;   Family History:  Family History  Problem Relation Age of Onset   Cancer Mother    Diabetes Mother    Heart disease Father    Hypertension Father     Heart disease Brother    Family Psychiatric  History:  Social History:  Social History   Substance and Sexual Activity  Alcohol Use Yes     Social History   Substance and Sexual Activity  Drug Use No    Social History   Socioeconomic History   Marital status: Widowed    Spouse name: Not on file   Number of children: Not on file   Years of education: Not on file   Highest education level: Not on file  Occupational History   Not on file  Tobacco Use   Smoking status: Former   Smokeless tobacco: Never  Substance and Sexual Activity   Alcohol use: Yes   Drug use: No   Sexual activity: Not on file  Other Topics Concern   Not on file  Social History Narrative   Not on file   Social Determinants of Health   Financial Resource Strain: Low Risk  (07/11/2021)   Overall Financial Resource Strain (CARDIA)    Difficulty of Paying Living Expenses: Not hard at all  Food Insecurity: No Food Insecurity (07/11/2021)   Hunger Vital Sign    Worried About Running Out of Food in the Last Year: Never true    Ran Out of Food in the Last Year: Never true  Transportation Needs: No Transportation Needs (07/11/2021)   PRAPARE - Administrator, Civil ServiceTransportation    Lack of Transportation (Medical): No    Lack of Transportation (Non-Medical): No  Physical Activity: Insufficiently Active (07/11/2021)   Exercise Vital Sign  Days of Exercise per Week: 4 days    Minutes of Exercise per Session: 30 min  Stress: No Stress Concern Present (07/11/2021)   [employer] of Occupational Health - Occupational Stress Questionnaire    Feeling of Stress : Not at all  Social Connections: Moderately Integrated (07/11/2021)   Social Connection and Isolation Panel [NHANES]    Frequency of Communication with Friends and Family: More than three times a week    Frequency of Social Gatherings with Friends and Family: More than three times a week    Attends Religious Services: 1 to 4 times per year    Active Member of [employer]  or Organizations: No    Attends Banker Meetings: Never    Marital Status: Married   Additional Social History:    Allergies:   Allergies  Allergen Reactions   Lotensin [Benazepril Hcl] Swelling    Labs:  Results for orders placed or performed during the hospital encounter of 03/30/22 (from the past 48 hour(s))  Comprehensive metabolic panel     Status: Abnormal   Collection Time: 03/30/22  1:52 PM  Result Value Ref Range   Sodium 142 135 - 145 mmol/L   Potassium 3.7 3.5 - 5.1 mmol/L   Chloride 109 98 - 111 mmol/L   CO2 27 22 - 32 mmol/L   Glucose, Bld 151 (H) 70 - 99 mg/dL    Comment: Glucose reference range applies only to samples taken after fasting for at least 8 hours.   BUN 18 8 - 23 mg/dL   Creatinine, Ser 8.18 0.61 - 1.24 mg/dL   Calcium 8.8 (L) 8.9 - 10.3 mg/dL   Total Protein 6.9 6.5 - 8.1 g/dL   Albumin 3.3 (L) 3.5 - 5.0 g/dL   AST 20 15 - 41 U/L   ALT 17 0 - 44 U/L   Alkaline Phosphatase 58 38 - 126 U/L   Total Bilirubin 0.8 0.3 - 1.2 mg/dL   GFR, Estimated >29 >93 mL/min    Comment: (NOTE) Calculated using the CKD-EPI Creatinine Equation (2021)    Anion gap 6 5 - 15    Comment: Performed at [facility], [address] [place], [place] [address]  Salicylate level     Status: Abnormal   Collection Time: 03/30/22  1:52 PM  Result Value Ref Range   Salicylate Lvl <7.0 (L) 7.0 - 30.0 mg/dL    Comment: Performed at [facility], [address] [place], [place] [address]  Acetaminophen level     Status: Abnormal   Collection Time: 03/30/22  1:52 PM  Result Value Ref Range   Acetaminophen (Tylenol), Serum <10 (L) 10 - 30 ug/mL    Comment: (NOTE) Therapeutic concentrations vary significantly. A range of 10-30 ug/mL  may be an effective concentration for many patients. However, some  are best treated at concentrations outside of this range. Acetaminophen concentrations >150 ug/mL at 4 hours after ingestion  and >50 ug/mL at  12 hours after ingestion are often associated with  toxic reactions.  Performed at [facility], [address] [place], [place] [address]   cbc     Status: Abnormal   Collection Time: 03/30/22  1:52 PM  Result Value Ref Range   WBC 5.2 4.0 - 10.5 K/uL   RBC 3.81 (L) 4.22 - 5.81 MIL/uL   Hemoglobin 11.4 (L) 13.0 - 17.0 g/dL   HCT 10.2 (L) 58.5 - 27.7 %   MCV 94.5 80.0 - 100.0 fL  MCH 29.9 26.0 - 34.0 pg   MCHC 31.7 30.0 - 36.0 g/dL   RDW 16.1 09.6 - 04.5 %   Platelets 271 150 - 400 K/uL   nRBC 0.0 0.0 - 0.2 %    Comment: Performed at [facility], [address] [place], [place] [address]  Ethanol     Status: None   Collection Time: 03/30/22  2:00 PM  Result Value Ref Range   Alcohol, Ethyl (B) <10 <10 mg/dL    Comment: (NOTE) Lowest detectable limit for serum alcohol is 10 mg/dL.  For medical purposes only. Performed at [facility], [address] [place], [place] [address]   Resp Panel by RT-PCR (Flu A&B, Covid) Anterior Nasal Swab     Status: None   Collection Time: 03/30/22  3:34 PM   Specimen: Anterior Nasal Swab  Result Value Ref Range   SARS Coronavirus 2 by RT PCR NEGATIVE NEGATIVE    Comment: (NOTE) SARS-CoV-2 target nucleic acids are NOT DETECTED.  The SARS-CoV-2 RNA is generally detectable in upper respiratory specimens during the acute phase of infection. The lowest concentration of SARS-CoV-2 viral copies this assay can detect is 138 copies/mL. A negative result does not preclude SARS-Cov-2 infection and should not be used as the sole basis for treatment or other patient management decisions. A negative result may occur with  improper specimen collection/handling, submission of specimen other than nasopharyngeal swab, presence of viral mutation(s) within the areas targeted by this assay, and inadequate number of viral copies(<138 copies/mL). A negative result must be combined with clinical observations, patient history,  and epidemiological information. The expected result is Negative.  Fact Sheet for Patients:  BloggerCourse.com  Fact Sheet for Healthcare Providers:  SeriousBroker.it  This test is no t yet approved or cleared by the [place] FDA and  has been authorized for detection and/or diagnosis of SARS-CoV-2 by FDA under an Emergency Use Authorization (EUA). This EUA will remain  in effect (meaning this test can be used) for the duration of the COVID-19 declaration under Section 564(b)(1) of the Act, 21 U.S.C.section 360bbb-3(b)(1), unless the authorization is terminated  or revoked sooner.       Influenza A by PCR NEGATIVE NEGATIVE   Influenza B by PCR NEGATIVE NEGATIVE    Comment: (NOTE) The Xpert Xpress SARS-CoV-2/FLU/RSV plus assay is intended as an aid in the diagnosis of influenza from Nasopharyngeal swab specimens and should not be used as a sole basis for treatment. Nasal washings and aspirates are unacceptable for Xpert Xpress SARS-CoV-2/FLU/RSV testing.  Fact Sheet for Patients: BloggerCourse.com  Fact Sheet for Healthcare Providers: SeriousBroker.it  This test is not yet approved or cleared by the [place] FDA and has been authorized for detection and/or diagnosis of SARS-CoV-2 by FDA under an Emergency Use Authorization (EUA). This EUA will remain in effect (meaning this test can be used) for the duration of the COVID-19 declaration under Section 564(b)(1) of the Act, 21 U.S.C. section 360bbb-3(b)(1), unless the authorization is terminated or revoked.  Performed at [facility], [address] [place], [place] [address]     Current Facility-Administered Medications  Medication Dose Route Frequency Provider Last Rate Last Admin   amLODipine (NORVASC) tablet 10 mg  10 mg Oral Daily Minna Antis, MD   10 mg at 03/31/22 1056   aspirin EC tablet  81 mg  81 mg Oral Daily Minna Antis, MD   81 mg at 03/31/22 1055   atorvastatin (LIPITOR) tablet 20 mg  20 mg Oral Daily Minna Antis, MD   20 mg at 03/31/22 1055   divalproex (DEPAKOTE SPRINKLE) capsule 125 mg  125 mg Oral Q12H Minna Antis, MD   125 mg at 03/31/22 1056   melatonin tablet 10 mg  10 mg Oral QHS Minna Antis, MD   10 mg at 03/30/22 2230   mirtazapine (REMERON) tablet 15 mg  15 mg Oral QHS Minna Antis, MD   15 mg at 03/30/22 2230   risperiDONE (RISPERDAL M-TABS) disintegrating tablet 0.25 mg  0.25 mg Oral BID PRN Vanetta Mulders, NP       risperiDONE (RISPERDAL) tablet 0.5 mg  0.5 mg Oral BID Minna Antis, MD   0.5 mg at 03/31/22 1054   Current Outpatient Medications  Medication Sig Dispense Refill   amLODipine (NORVASC) 10 MG tablet Take 1 tablet (10 mg total) by mouth daily. 90 tablet 4   aspirin EC 81 MG tablet Take 1 tablet (81 mg total) by mouth daily. Swallow whole. 90 tablet 4   atorvastatin (LIPITOR) 20 MG tablet Take 1 tablet (20 mg total) by mouth daily. 90 tablet 4   divalproex (DEPAKOTE SPRINKLE) 125 MG capsule Take 1 capsule (125 mg total) by mouth every 12 (twelve) hours. 180 capsule 4   Melatonin 10 MG SUBL Place 1 tablet under the tongue at bedtime.     mirtazapine (REMERON) 15 MG tablet Take 1 tablet (15 mg total) by mouth at bedtime. 90 tablet 4   OLANZapine (ZYPREXA) 5 MG tablet Take 5 mg by mouth 2 (two) times daily as needed (as needed for behavior).     risperiDONE (RISPERDAL) 0.5 MG tablet Take 1 tablet (0.5 mg total) by mouth 2 (two) times daily. 180 tablet 4   traZODone (DESYREL) 50 MG tablet Take 50 mg by mouth at bedtime.     Melatonin 10 MG/ML LIQD Take 10 mg by mouth at bedtime. (Patient not taking: Reported on 03/30/2022) 59 mL 6   trimethoprim-polymyxin b (POLYTRIM) ophthalmic solution 1 drop every 6 (six) hours. (Patient not taking: Reported on 03/30/2022)      Musculoskeletal: Strength & Muscle Tone: within  normal limits Gait & Station: normal Patient leans: N/A            Psychiatric Specialty Exam:  Presentation  General Appearance: Appropriate for Environment  Eye Contact:Good  Speech:Clear and Coherent  Speech Volume:Normal  Handedness:Right   Mood and Affect  Mood:Euthymic  Affect:Congruent   Thought Process  Thought Processes:Disorganized  Descriptions of Associations:Loose  Orientation:Partial  Thought Content:Illogical  History of Schizophrenia/Schizoaffective disorder:No  Duration of Psychotic Symptoms:No data recorded Hallucinations:Hallucinations: None  Ideas of Reference:None  Suicidal Thoughts:Suicidal Thoughts: -- Rich Reining)  Homicidal Thoughts:Homicidal Thoughts: -- Rich Reining)   Sensorium  Memory:Immediate Poor  Judgment:Impaired  Insight:Poor   Executive Functions  Concentration:Poor  Attention Span:Poor  Recall:Poor  Fund of Knowledge:Poor  Language:Poor   Psychomotor Activity  Psychomotor Activity:Psychomotor Activity: Normal   Assets  Assets:Resilience   Sleep  Sleep:Sleep: Good   Physical Exam: Physical Exam Nursing note reviewed.  HENT:     Nose: No congestion or rhinorrhea.  Cardiovascular:     Rate and Rhythm: Normal rate.  Pulmonary:     Effort: Pulmonary effort is normal.  Musculoskeletal:        General: Normal range of motion.     Cervical back: Normal range of motion.  Skin:    General: Skin is dry.  Neurological:     Mental Status: He is alert.  Review of Systems  Reason unable to perform ROS: pt does not verbalize complaints.  Constitutional: Negative.   Respiratory: Negative.    Skin: Negative.   Neurological: Negative.    Blood pressure 139/65, pulse 62, temperature 98.3 F (36.8 C), temperature source Oral, resp. rate 17, height 6\' 2"  (1.88 m), SpO2 96 %. Body mass index is 24.92 kg/m.  Treatment Plan Summary: Daily contact with patient to assess and evaluate symptoms and  progress in treatment and Medication management.  For aggression: Added Risperdal 0.25 mg twice daily as needed for aggression.   Disposition:  Transition of care team is looking for appropriate placement for patient.   , NP 03/31/2022 11:54 AM

## 2022-03-31 NOTE — ED Notes (Signed)
Breakfast tray and beverage provided 

## 2022-03-31 NOTE — [place] FL2 (Signed)
[place] MEDICAID FL2 LEVEL OF CARE SCREENING TOOL     IDENTIFICATION  Patient Name: Larry Buckley Birthdate: 03-31-1941 Sex: male Admission Date (Current Location): 03/30/2022  [place] and [place] Number:  Chiropodist and Address:  [facility], [address], [place], [place] [address]      Provider Number: [id number]  Attending Physician Name and Address:  No att. providers found  Relative Name and Phone Number:  Diona Fanti Worley APS [phone]    Current Level of Care: Other (Comment) (ED boarder) Recommended Level of Care: Assisted Living Facility Prior Approval Number:    Date Approved/Denied:   PASRR Number:    Discharge Plan: Domiciliary (Rest home) (ALF/ adult  care home)    Current Diagnoses: Patient Active Problem List   Diagnosis Date Noted   Edema of both legs 01/27/2022   Prediabetes 01/04/2022   Dementia with behavioral disturbance (HCC) 12/29/2021   History of abdominal aortic aneurysm (AAA) repair 09/25/2021   Mixed dementia (HCC) 07/11/2021   B12 deficiency 07/11/2021   Hyperlipidemia, mixed    Hypertension    Gout, arthropathy     Orientation RESPIRATION BLADDER Height & Weight     Self  Normal Continent Weight:   Height:  6\' 2"  (188 cm)  BEHAVIORAL SYMPTOMS/MOOD NEUROLOGICAL BOWEL NUTRITION STATUS  Wanderer, Other (Comment) ([place])   Continent Diet (Regular)  AMBULATORY STATUS COMMUNICATION OF NEEDS Skin   Supervision Verbally Normal                       Personal Care Assistance Level of Assistance  Bathing, Feeding, Dressing Bathing Assistance: Limited assistance Feeding assistance: Independent Dressing Assistance: Limited assistance     Functional Limitations Info  Sight, Hearing, Speech Sight Info: Adequate Hearing Info: Adequate Speech Info: Adequate    SPECIAL CARE FACTORS FREQUENCY                       Contractures Contractures Info: Not present    Additional  Factors Info  Code Status, Allergies Code Status Info: Full Allergies Info: Lotensin (benazepril Hcl)           Current Medications (03/31/2022):  This is the current hospital active medication list Current Facility-Administered Medications  Medication Dose Route Frequency Provider Last Rate Last Admin   amLODipine (NORVASC) tablet 10 mg  10 mg Oral Daily 04/02/2022, MD       aspirin EC tablet 81 mg  81 mg Oral Daily Minna Antis, MD       atorvastatin (LIPITOR) tablet 20 mg  20 mg Oral Daily Minna Antis, MD       divalproex (DEPAKOTE SPRINKLE) capsule 125 mg  125 mg Oral Q12H Minna Antis, MD   125 mg at 03/30/22 2230   melatonin tablet 10 mg  10 mg Oral QHS 04/01/22, MD   10 mg at 03/30/22 2230   mirtazapine (REMERON) tablet 15 mg  15 mg Oral QHS 04/01/22, MD   15 mg at 03/30/22 2230   risperiDONE (RISPERDAL) tablet 0.5 mg  0.5 mg Oral BID 2231, MD   0.5 mg at 03/30/22 2231   Current Outpatient Medications  Medication Sig Dispense Refill   amLODipine (NORVASC) 10 MG tablet Take 1 tablet (10 mg total) by mouth daily. 90 tablet 4   aspirin EC 81 MG tablet Take 1 tablet (81 mg total) by mouth daily. Swallow whole. 90 tablet 4   atorvastatin (LIPITOR)  20 MG tablet Take 1 tablet (20 mg total) by mouth daily. 90 tablet 4   divalproex (DEPAKOTE SPRINKLE) 125 MG capsule Take 1 capsule (125 mg total) by mouth every 12 (twelve) hours. 180 capsule 4   Melatonin 10 MG SUBL Place 1 tablet under the tongue at bedtime.     mirtazapine (REMERON) 15 MG tablet Take 1 tablet (15 mg total) by mouth at bedtime. 90 tablet 4   OLANZapine (ZYPREXA) 5 MG tablet Take 5 mg by mouth 2 (two) times daily as needed (as needed for behavior).     risperiDONE (RISPERDAL) 0.5 MG tablet Take 1 tablet (0.5 mg total) by mouth 2 (two) times daily. 180 tablet 4   traZODone (DESYREL) 50 MG tablet Take 50 mg by mouth at bedtime.     Melatonin 10 MG/ML LIQD Take 10 mg  by mouth at bedtime. (Patient not taking: Reported on 03/30/2022) 59 mL 6   trimethoprim-polymyxin b (POLYTRIM) ophthalmic solution 1 drop every 6 (six) hours. (Patient not taking: Reported on 03/30/2022)       Discharge Medications: Please see discharge summary for a list of discharge medications.  Relevant Imaging Results:  Relevant Lab Results:   Additional Information SSN: [id number]  Allayne Butcher, RN

## 2022-03-31 NOTE — ED Notes (Signed)
Pt sitting up on side of stretcher awaiting breakfast. Appears restless and frequently attempting to ambulate in hallway; redirectable.

## 2022-03-31 NOTE — ED Notes (Signed)
Patient given dinner tray.

## 2022-03-31 NOTE — ED Notes (Signed)
IVC/TOC placement 

## 2022-03-31 NOTE — TOC Initial Note (Signed)
Transition of Care [facility]) - Initial/Assessment Note    Patient Details  Name: Larry Buckley MRN: [id number] Date of Birth: Jan 04, 1941  Transition of Care [facility]) CM/SW Contact:    Allayne Butcher, RN Phone Number: 03/31/2022, 8:59 AM  Clinical Narrative:                 Patient brought into the emergency room IVC'd for aggressive behavior toward the group home owners this has happened on several occasions.  Sanjuana Kava will not accept patient back due to his aggressive behavior towards her and her husband. RNCM reached out to Diona Fanti with APS- informed her that patient back in the emergency department and that we were going to need to look into alternative placement.  Patient gets close to $2000/month and will not qualify for SA for Memory Care. Verlon Au reports that patient has been doing well during the day and loves to sit outside at the group home but at night when they try to get him to settle down for the night he becomes violent.  TOC will work on placement, patient will be difficult to place due to behaviors.  Psych input for medication adjustments to help with patient sundowning would be appreciated.    Expected Discharge Plan: Assisted Living Barriers to Discharge: Other (must enter comment) (group home will not take back)   Patient Goals and CMS Choice Patient states their goals for this hospitalization and ongoing recovery are:: patient unable to state goals- plan for adult care home/ALF CMS Medicare.gov Compare Post Acute Care list provided to:: Legal Guardian Choice offered to / list presented to : [facility] POA / Guardian  Expected Discharge Plan and Services Expected Discharge Plan: Assisted Living   Discharge Planning Services: CM Consult   Living arrangements for the past 2 months: Assisted Living Facility, Single Family Home                 DME Arranged: N/A DME Agency: NA       HH Arranged: NA HH Agency: NA        Prior Living Arrangements/Services Living  arrangements for the past 2 months: Assisted Living Facility, Single Family Home Lives with:: Facility Resident Patient language and need for interpreter reviewed:: Yes Do you feel safe going back to the place where you live?: No   patient gets agressive at night  Need for Family Participation in Patient Care: Yes (Comment) Care giver support system in place?: Yes (comment)   Criminal Activity/Legal Involvement Pertinent to Current Situation/Hospitalization: No - Comment as needed  Activities of Daily Living      Permission Sought/Granted Permission sought to share information with : Case Manager, Guardian       Permission granted to share info w AGENCY: Diona Fanti [place] [facility]        Emotional Assessment Appearance:: Appears stated age     Orientation: : Oriented to Self Alcohol / Substance Use: Not Applicable Psych Involvement: Yes (comment)  Admission diagnosis:  ivc Patient Active Problem List   Diagnosis Date Noted   Edema of both legs 01/27/2022   Prediabetes 01/04/2022   Dementia with behavioral disturbance (HCC) 12/29/2021   History of abdominal aortic aneurysm (AAA) repair 09/25/2021   Mixed dementia (HCC) 07/11/2021   B12 deficiency 07/11/2021   Hyperlipidemia, mixed    Hypertension    Gout, arthropathy    PCP:  Marjie Skiff, NP Pharmacy:   [facility] [place] [place] - [address] [place] [address] Phone: [phone] Fax: [phone]     Social Determinants of Health (SDOH) Interventions    Readmission Risk Interventions     No data to display

## 2022-04-01 NOTE — ED Notes (Signed)
Pt now awake walking to bathroom to void.

## 2022-04-01 NOTE — ED Provider Notes (Signed)
Emergency Medicine Observation Re-evaluation Note  Kutter Schnepf is a 81 y.o. male, seen on rounds today.  Pt initially presented to the ED for complaints of Psychiatric Evaluation Currently, the patient is resting.  Physical Exam  BP 139/65 (BP Location: Right Arm)   Pulse 62   Temp 98.3 F (36.8 C) (Oral)   Resp 17   Ht 6\' 2"  (1.88 m)   SpO2 96%   BMI 24.92 kg/m  Physical Exam Gen: No acute distress  Resp: Normal rise and fall of chest Neuro: Moving all four extremities Psych: Resting currently, calm and cooperative when awake    ED Course / MDM  EKG:   I have reviewed the labs performed to date as well as medications administered while in observation.  Recent changes in the last 24 hours include no acute events overnight.  Plan  Current plan is for psychiatric evaluation, TOC placement. Darren Caldron is under involuntary commitment.      Honesty Menta, Josephine Igo, DO 04/01/22 [phone]

## 2022-04-02 LAB — URINE DRUG SCREEN, QUALITATIVE (ARMC ONLY)
Amphetamines, Ur Screen: NOT DETECTED
Barbiturates, Ur Screen: NOT DETECTED
Benzodiazepine, Ur Scrn: NOT DETECTED
Cannabinoid 50 Ng, Ur ~~LOC~~: NOT DETECTED
Cocaine Metabolite,Ur ~~LOC~~: NOT DETECTED
MDMA (Ecstasy)Ur Screen: NOT DETECTED
Methadone Scn, Ur: NOT DETECTED
Opiate, Ur Screen: NOT DETECTED
Phencyclidine (PCP) Ur S: NOT DETECTED
Tricyclic, Ur Screen: NOT DETECTED

## 2022-04-02 NOTE — ED Notes (Signed)
Report received from Dawn T , RN including SBAR. On initial round after report Pt is warm/dry, resting quietly in room without any s/s of distress.  Will continue to monitor throughout shift as ordered for any changes in behaviors and for continued safety.   

## 2022-04-02 NOTE — ED Notes (Signed)
IVC/pending TOC placement 

## 2022-04-02 NOTE — ED Notes (Signed)
IVC pending placement 

## 2022-04-02 NOTE — ED Notes (Signed)
Pt given a sandwich tray, sprite, and mango icy

## 2022-04-03 MED ORDER — MIRTAZAPINE 15 MG PO TABS: 15.0000 mg | ORAL_TABLET | Freq: Every day | ORAL | 2 refills | Status: DC

## 2022-04-03 MED ORDER — RISPERIDONE 0.5 MG PO TABS: 0.5000 mg | ORAL_TABLET | Freq: Two times a day (BID) | ORAL | 1 refills | Status: AC

## 2022-04-03 MED ORDER — RISPERIDONE 0.5 MG PO TABS: 0.5000 mg | ORAL_TABLET | Freq: Two times a day (BID) | ORAL | 1 refills | Status: DC

## 2022-04-03 MED ORDER — RISPERIDONE 0.25 MG PO TABS: 0.2500 mg | ORAL_TABLET | Freq: Two times a day (BID) | ORAL | 1 refills | Status: DC | PRN

## 2022-04-03 NOTE — ED Notes (Signed)
Hospital meal provided.  100% consumed, pt tolerated w/o complaints.  Waste discarded appropriately.   

## 2022-04-03 NOTE — ED Provider Notes (Signed)
Emergency Medicine Observation Re-evaluation Note  Jontay Maston is a 81 y.o. male, seen on rounds today.  Pt initially presented to the ED for complaints of Psychiatric Evaluation Currently, the patient is resting, voicing no medical complaints.  Physical Exam  BP 131/62 (BP Location: Left Arm)   Pulse 61   Temp 98.7 F (37.1 C) (Oral)   Resp 16   Ht 6\' 2"  (1.88 m)   SpO2 97%   BMI 24.92 kg/m  Physical Exam General: Resting in no acute distress Cardiac: No cyanosis Lungs: Equal rise and fall Psych: Not agitated  ED Course / MDM  EKG:   I have reviewed the labs performed to date as well as medications administered while in observation.  Recent changes in the last 24 hours include no events overnight.  Plan  Current plan is for psychiatric disposition. Ardie Mclennan is under involuntary commitment.      Josephine Igo, MD 04/03/22 [phone]

## 2022-04-03 NOTE — ED Notes (Signed)
Pt requested shower; provided clean hospital clothing and linens.  Shower setup provided with soap, shampoo, toothbrush/toothpaste, and deodorant.  Pt able to preform own ADL's with no assistance.    

## 2022-04-03 NOTE — ED Notes (Signed)
IVC PAPERS  RESCINDED  INFORMED  RN  ALLY

## 2022-04-03 NOTE — Consult Note (Signed)
Patient has been cooperative, with no aggression shown. He appears to be doing well with addition of Remeron, 15 mg at bedtime. No unsafe behaviors appreciated .Writer released IVC.  Vanetta Mulders, PMHNP-BC

## 2022-04-03 NOTE — TOC Transition Note (Signed)
Transition of Care [facility]) - CM/SW Discharge Note   Patient Details  Name: Larry Buckley MRN: [id number] Date of Birth: Feb 17, 1941  Transition of Care [facility]) CM/SW Contact:  Allayne Butcher, RN Phone Number: 04/03/2022, 11:41 AM   Clinical Narrative:    Larry Buckley with [place] [facility] called and said that Larry Buckley will take the patient back at least for a 30 day trial as she never gave 30 day notice of discharge.  Larry Buckley requests that prescriptions be sent to [name].  She will pick up the patient this afternoon around 2 or 3 pm.     Final next level of care: Assisted Living Barriers to Discharge: Barriers Resolved   Patient Goals and CMS Choice Patient states their goals for this hospitalization and ongoing recovery are:: patient unable to state goals- plan for adult care Buck[facility]/ALF CMS Medicare.gov Compare Post Acute Care list provided to:: Legal Guardian Choice offered to / list presented to : Larry Buckley POA / Guardian  Discharge Placement              Patient chooses bed at: Other - please specify in the comment section below: (Larry Buckley) Patient to be transferred to facility by: Larry Buckley Name of family member notified: Larry Buckley [facility]PS Patient and family notified of of transfer: 04/03/22  Discharge Plan and Services   Discharge Planning Services: CM Consult            DME Arranged: N/A DME Agency: NA       HH Arranged: NA HH Agency: NA        Social Determinants of Health (SDOH) Interventions     Readmission Risk Interventions     No data to display

## 2022-04-03 NOTE — ED Notes (Addendum)
Harvey,Bettina Other     [phone] Director of [facility]  Coming to pickup patient. Pt dressed into his regular clothing. Refused repeat VS.

## 2022-04-03 NOTE — ED Provider Notes (Signed)
RegularPatient seen and evaluated by psychiatric team and IVC rescinded.  Cleared from their perspective.  We will discharge with regular prescriptions back to his group home.   Delton Prairie, MD 04/03/22 1224

## 2022-04-04 ENCOUNTER — Other Ambulatory Visit: Payer: Medicare PPO

## 2022-04-04 DIAGNOSIS — Z789 Other specified health status: Secondary | ICD-10-CM

## 2022-04-04 NOTE — Progress Notes (Signed)
PC SW outreached Larry Buckley, owner of [facility], of which patient is a resident.   Mrs. Larry Buckley shared that she recently had to complete an IVC for patient last week due to patient exhibited agitation and aggressive behaviors towards others. Upon release/clearance from hospital hold Mrs. Larry Buckley shared that she was forced to re-admit patient back into her care home, after requesting that he not return. Mrs. Larry Buckley states that DSS informed her that she had to admit patient back due to not issuing a DC notice. [facility] shared that they do not want to issue a DC notice due to having the responsibility of having a new DC plan/location for patient once issued.  Mrs. Larry Buckley share that DSS/APS is not involved with patient and family is not involved in decision making either. SW advised [facility] to request DSS files for guardianship for patient. Mrs. Larry Buckley shared that she will outreach DSS.   ACH had no other questions or concerns for SW.

## 2022-04-12 ENCOUNTER — Ambulatory Visit: Payer: Medicare PPO | Admitting: Nurse Practitioner

## 2022-04-13 DIAGNOSIS — F02B2 Dementia in other diseases classified elsewhere, moderate, with psychotic disturbance: Secondary | ICD-10-CM | POA: Diagnosis not present

## 2022-04-13 DIAGNOSIS — I713 Abdominal aortic aneurysm, ruptured, unspecified: Secondary | ICD-10-CM | POA: Diagnosis not present

## 2022-04-13 DIAGNOSIS — R29810 Facial weakness: Secondary | ICD-10-CM | POA: Diagnosis not present

## 2022-04-13 DIAGNOSIS — G301 Alzheimer's disease with late onset: Secondary | ICD-10-CM | POA: Diagnosis not present

## 2022-04-17 ENCOUNTER — Ambulatory Visit: Payer: Medicare PPO

## 2022-04-19 ENCOUNTER — Ambulatory Visit: Payer: Self-pay | Admitting: *Deleted

## 2022-04-19 NOTE — Telephone Encounter (Signed)
Routing to provider to advise.  

## 2022-04-19 NOTE — Telephone Encounter (Signed)
Summary: Per agent: Insomnia/pt is not sleeping at all   Pt has been at [facility] for about 3-4 days, and Corrie Dandy stated pt is not sleeping. Corrie Dandy stated pt is trying to leave and is in everyone's rooms, waking up other pts in the kitchen. Moving dressers and furniture and throwing things on the floor.    Corrie Dandy stated pt is currently taking the medication risperiDONE (RISPERDAL) 0.5 MG tablet and Melatonin 10 MG however, Corrie Dandy stated it is not touching him. Pt is not sleeping at night or during the day.   Corrie Dandy stated pt is disturbing everyone and needs medication as soon as possible.    Mary seeking clinical advice.      Chief Complaint: Insomnia. Symptoms: Caregiver 'Corrie Dandy' states pt has been awake for 4 days "Straight." No sleep at all, during day or night. States pt wandering,moving furniture, going into pt's rooms, disruptive. HAs been at this facility for 4 days.States is eating and drinking well. Does not appear to be in pain. Frequency: 4 days Pertinent Negatives: Patient denies  Disposition: [] ED /[] Urgent Care (no appt availability in office) / [] Appointment(In office/virtual)/ []  [facility]/ [] Home Care/ [] Refused Recommended Disposition /[] [place] [facility]/ [x]  Follow-up with PCP Additional Notes: Requesting "Something to help him sleep and calm him down." Please advise. Pt does have upcoming appt. 04/25/22 Reason for Disposition  Requesting medication for sleep ("sleeping pill")  Answer Assessment - Initial Assessment Questions 1. DESCRIPTION: "Tell me about your sleeping problem."      , Caregiver 2. ONSET: "How long have you been having trouble sleeping?" (e.g., days, weeks, months)     4 days at new facility 3. RECURRENT: "Have you had sleeping problems before?"  If Yes, ask: "What happened that time?" "What helped your sleeping problem go away in the past?"       4. STRESS: "Is there anything in your life that is making you feel stressed or  tense?"      5. PAIN: "Do you have any pain that is keeping you awake?" (e.g., back pain, headache, abdominal pain)     no 6. CAFFEINE ABUSE: "Do you drink caffeinated beverages, and how much each day?" (e.g., coffee, tea, colas)     no 7. ALCOHOL USE OR SUBSTANCE USE (DRUG USE): "Do you drink alcohol or use any illegal drugs?"     no 8. OTHER SYMPTOMS: "Do you have any other symptoms?"  (e.g., difficulty breathing)     no  Protocols used: Insomnia-A-AH

## 2022-04-19 NOTE — Telephone Encounter (Signed)
Returned call to [facility] and notified her of Jolene's recommendations. Mary verbalized understanding and has no further questions at this time.

## 2022-04-23 NOTE — Patient Instructions (Signed)
Dementia Caregiver Guide Dementia is a term used to describe a number of symptoms that affect memory and thinking. The most common symptoms include: Memory loss. Trouble with language and communication. Trouble concentrating. Poor judgment and problems with reasoning. Wandering from home or public places. Extreme anxiety or depression. Being suspicious or having angry outbursts and accusations. Child-like behavior and language. Dementia can be frightening and confusing. And taking care of someone with dementia can be challenging. This guide provides tips to help you when providing care for a person with dementia. How to help manage lifestyle changes Dementia usually gets worse slowly over time. In the early stages, people with dementia can stay independent and safe with some help. In later stages, they need help with daily tasks such as dressing, grooming, and using the bathroom. There are actions you can take to help a person manage his or her life while living with this condition. Communicating When the person is talking or seems frustrated, make eye contact and hold the person's hand. Ask specific questions that need yes or no answers. Use simple words, short sentences, and a calm voice. Only give one direction at a time. When offering choices, limit the person to just one or two. Avoid correcting the person in a negative way. If the person is struggling to find the right words, gently try to help him or her. Preventing injury  Keep floors clear of clutter. Remove rugs, magazine racks, and floor lamps. Keep hallways well lit, especially at night. Put a handrail and nonslip mat in the bathtub or shower. Put childproof locks on cabinets that contain dangerous items, such as medicines, alcohol, guns, toxic cleaning items, sharp tools or utensils, matches, and lighters. For doors to the outside of the house, put the locks in places where the person cannot see or reach them easily. This will  help ensure that the person does not wander out of the house and get lost. Be prepared for emergencies. Keep a list of emergency phone numbers and addresses in a convenient area. Remove car keys and lock garage doors so that the person does not try to get in the car and drive. Have the person wear a bracelet that tracks locations and identifies the person as having memory problems. This should be worn at all times for safety. Helping with daily life  Keep the person on track with his or her routine. Try to identify areas where the person may need help. Be supportive, patient, calm, and encouraging. Gently remind the person that adjusting to changes takes time. Help with the tasks that the person has asked for help with. Keep the person involved in daily tasks and decisions as much as possible. Encourage conversation, but try not to get frustrated if the person struggles to find words or does not seem to appreciate your help. How to recognize stress Look for signs of stress in yourself and in the person you are caring for. If you notice signs of stress, take steps to manage it. Symptoms of stress include: Feeling anxious, irritable, frustrated, or angry. Denying that the person has dementia or that his or her symptoms will not improve. Feeling depressed, hopeless, or unappreciated. Difficulty sleeping. Difficulty concentrating. Developing stress-related health problems. Feeling like you have too little time for your own life. Follow these instructions at home: Take care of your health Make sure that you and the person you are caring for: Get regular sleep. Exercise regularly. Eat regular, nutritious meals. Take over-the-counter and prescription medicines only   as told by your health care providers. Drink enough fluid to keep your urine pale yellow. Attend all scheduled health care appointments.  General instructions Join a support group with others who are caregivers. Ask about  respite care resources. Respite care can provide short-term care for the person so that you can have a regular break from the stress of caregiving. Consider any safety risks and take steps to avoid them. Organize medicines in a pill box for each day of the week. Create a plan to handle any legal or financial matters. Get legal or financial advice if needed. Keep a calendar in a central location to remind the person of appointments or other activities. Where to find support: Many individuals and organizations offer support. These include: Support groups for people with dementia. Support groups for caregivers. Counselors or therapists. Home health care services. Adult day care centers. Where to find more information Centers for Disease Control and Prevention: www.cdc.gov Alzheimer's Association: www.alz.org Family Caregiver Alliance: www.caregiver.org Alzheimer's Foundation of America: www.alzfdn.org Contact a health care provider if: The person's health is rapidly getting worse. You are no longer able to care for the person. Caring for the person is affecting your physical and emotional health. You are feeling depressed or anxious about caring for the person. Get help right away if: The person threatens himself or herself, you, or anyone else. You feel depressed or sad, or feel that you want to harm yourself. If you ever feel like your loved one may hurt himself or herself or others, or if he or she shares thoughts about taking his or her own life, get help right away. You can go to your nearest emergency department or: Call your local emergency services (911 in the [place] Call a suicide crisis helpline, such as the National Suicide Prevention Lifeline at 1-800-273-8255 or 988 in the [place] This is open 24 hours a day in the [place] Text the Crisis Text Line at 741741 (in the [place] Summary Dementia is a term used to describe a number of symptoms that affect memory and thinking. Dementia  usually gets worse slowly over time. Take steps to reduce the person's risk of injury and to plan for future care. Caregivers need support, relief from caregiving, and time for their own lives. This information is not intended to replace advice given to you by your health care provider. Make sure you discuss any questions you have with your health care provider. Document Revised: 04/27/2021 Document Reviewed: 02/16/2020 Elsevier Patient Education  2023 Elsevier Inc.  

## 2022-04-24 ENCOUNTER — Other Ambulatory Visit: Payer: Self-pay | Admitting: Nurse Practitioner

## 2022-04-24 NOTE — Telephone Encounter (Signed)
Medication Refill - Medication: traZODone (DESYREL) 50 MG tablet and Melatonin 10 MG SUBL,   Has the patient contacted their pharmacy? Yes.   Pt told to contact provider   Preferred Pharmacy (with phone number or street name):  [facility] [place], [place] - [address] Phone:  [phone]  Fax:  [phone]     Has the patient been seen for an appointment in the last year OR does the patient have an upcoming appointment? Yes.    Agent: Please be advised that RX refills may take up to 3 business days. We ask that you follow-up with your pharmacy.

## 2022-04-25 ENCOUNTER — Ambulatory Visit (INDEPENDENT_AMBULATORY_CARE_PROVIDER_SITE_OTHER): Payer: Medicare PPO | Admitting: Nurse Practitioner

## 2022-04-25 ENCOUNTER — Encounter: Payer: Self-pay | Admitting: Nurse Practitioner

## 2022-04-25 VITALS — BP 123/72 | HR 63 | Temp 98.6°F | Ht 74.0 in | Wt 197.4 lb

## 2022-04-25 DIAGNOSIS — R7303 Prediabetes: Secondary | ICD-10-CM

## 2022-04-25 DIAGNOSIS — I10 Essential (primary) hypertension: Secondary | ICD-10-CM

## 2022-04-25 DIAGNOSIS — F03918 Unspecified dementia, unspecified severity, with other behavioral disturbance: Secondary | ICD-10-CM

## 2022-04-25 DIAGNOSIS — E782 Mixed hyperlipidemia: Secondary | ICD-10-CM

## 2022-04-25 DIAGNOSIS — E538 Deficiency of other specified B group vitamins: Secondary | ICD-10-CM

## 2022-04-25 MED ORDER — MELATONIN 10 MG SL SUBL: 1.0000 | SUBLINGUAL_TABLET | Freq: Every day | SUBLINGUAL | 4 refills | Status: AC

## 2022-04-25 MED ORDER — AMLODIPINE BESYLATE 5 MG PO TABS: 5.0000 mg | ORAL_TABLET | Freq: Every day | ORAL | 4 refills | Status: AC

## 2022-04-25 MED ORDER — MIRTAZAPINE 15 MG PO TABS: 15.0000 mg | ORAL_TABLET | Freq: Every day | ORAL | 4 refills | Status: AC

## 2022-04-25 NOTE — Assessment & Plan Note (Addendum)
Chronic, progressive in nature.  Continue collaboration with neurology and current medication regimen as prescribed by them.  Could consider in future medication reduction with discontinuation of Atorvastatin.  Refills on Melatonin and Mirtazapine sent.

## 2022-04-25 NOTE — Assessment & Plan Note (Signed)
Noted on past labs, recheck A1c today.  Initiate medication as needed.

## 2022-04-25 NOTE — Telephone Encounter (Signed)
Requested medication (s) are due for refill today: unclear, historical provider  Requested medication (s) are on the active medication list: yes  Last refill:  03/30/22 for Melatonin, Desyrel 03/15/22 no more info from historical provider  Future visit scheduled: TODAY this afternoon  Notes to clinic:  These meds are from historical providers, however, pt has appt this afternoon, please assess.      Requested Prescriptions  Pending Prescriptions Disp Refills   Melatonin 10 MG SUBL      Sig: Place 1 tablet under the tongue at bedtime.     Off-Protocol Failed - 04/24/2022  2:59 PM      Failed - Medication not assigned to a protocol, review manually.      Passed - Valid encounter within last 12 months    Recent Outpatient Visits           7 months ago Mixed dementia (HCC)   [facility] [place], Dorie Rank, NP   9 months ago Encounter to establish care   [facility] [place], Dorie Rank, NP       Future Appointments             Today Marjie Skiff, NP [facility], [facility]   In 1 week  [facility], [facility]             traZODone (DESYREL) 50 MG tablet      Sig: Take 1 tablet (50 mg total) by mouth at bedtime.     Psychiatry: Antidepressants - Serotonin Modulator Failed - 04/24/2022  2:59 PM      Failed - Valid encounter within last 6 months    Recent Outpatient Visits           7 months ago Mixed dementia (HCC)   [facility] [place], Dorie Rank, NP   9 months ago Encounter to establish care   [facility], Dorie Rank, NP       Future Appointments             Today Marjie Skiff, NP [facility], [facility]   In 1 week  [facility], [facility]

## 2022-04-25 NOTE — Assessment & Plan Note (Signed)
Chronic, stable with BP at goal.  Does have dependent edema bilaterally, will reduce Amlodipine to 5 MG daily and see if benefit to this.  Discussed with caregiver.  Recommend she monitor BP at least a few mornings a week at home and document.  DASH diet at home.  Labs: CBC, CMP, TSH.  Return in 6 weeks.

## 2022-04-25 NOTE — Assessment & Plan Note (Signed)
Chronic, ongoing.  Continue current medication regimen and adjust as needed.  May consider medication reduction and discontinuation of this in future.  Lipid panel today.

## 2022-04-25 NOTE — Progress Notes (Signed)
BP 123/72   Pulse 63   Temp 98.6 F (37 C) (Oral)   Ht 6\' 2"  (1.88 m)   Wt 197 lb 6.4 oz (89.5 kg)   SpO2 98%   BMI 25.34 kg/m    Subjective:    Patient ID: Larry Buckley, male    DOB: 05-13-1941, 81 y.o.   MRN: [id number]  HPI: Larry Buckley is a 81 y.o. male  Chief Complaint  Patient presents with   Dementia    Patient is here for Dementia hospitalization follow up back on 03/30/22. Patient caregiver says the patient just came to her about 3 days. Patient says the patient is awake all day and night and says he is into things all day. Patient says his Alzheimer is interfering with his sleeping pattern. Patient caregiver says he is currently on Trazodone and it is not helping at all. Patient was recently prescribed Depakote and he has not started it.    Caregiver present at bedside.  DEMENT[facility] Followed by neurology, last visit 04/13/22 for Lewy Body/Alzheimer's dementia.  They recommended to continue Mirtazapine.  Increased Risperdal to 0.75 MG BID and one extra dose as needed.  On 04/19/22 they added on Depakote due to ongoing behaviors and poor sleep pattern + increase Risperdal to 1 MG BID-- has not started this as of yet.  His wife passed away in 11/07/21.  Currently he is living at [facility] in [place] home.  They report he is up all night wandering and does not sleep much during daytime.   Mood status: exacerbated Satisfied with current treatment?: yes Symptom severity: moderate  Duration of current treatment : chronic Side effects: no Medication compliance: good compliance Depressed mood: no Anxious mood: yes Anhedonia: no Significant weight loss or gain: no Insomnia: yes Fatigue: no Feelings of worthlessness or guilt: no Impaired concentration/indecisiveness: no Suicidal ideations: no Hopelessness: no Crying spells: no    04/25/2022    1:56 PM 07/11/2021   10:32 AM  Depression screen PHQ 2/9  Decreased Interest 0 0  Down, Depressed, Hopeless 0 0  PHQ - 2 Score  0 0  Altered sleeping 3   Tired, decreased energy 0   Change in appetite 0   Feeling bad or failure about yourself  0   Trouble concentrating 3   Moving slowly or fidgety/restless 0   Suicidal thoughts 0   PHQ-9 Score 6   Difficult doing work/chores Extremely dIfficult        04/25/2022    1:56 PM  GAD 7 : Generalized Anxiety Score  Nervous, Anxious, on Edge 0  Control/stop worrying 0  Worry too much - different things 0  Trouble relaxing 3  Restless 3  Easily annoyed or irritable 1  Afraid - awful might happen 1  Total GAD 7 Score 8  Anxiety Difficulty Very difficult      Relevant past medical, surgical, family and social history reviewed and updated as indicated. Interim medical history since our last visit reviewed. Allergies and medications reviewed and updated.  Review of Systems  Constitutional:  Negative for activity change, appetite change, chills, diaphoresis, fatigue, fever and unexpected weight change.  Respiratory:  Negative for cough, chest tightness, shortness of breath and wheezing.   Cardiovascular:  Negative for chest pain, palpitations and leg swelling.  Gastrointestinal: Negative.   Neurological: Negative.   Psychiatric/Behavioral:  Positive for confusion (dementia). Negative for self-injury, sleep disturbance and suicidal ideas. The patient is nervous/anxious.     Per HPI  unless specifically indicated above     Objective:    BP 123/72   Pulse 63   Temp 98.6 F (37 C) (Oral)   Ht 6\' 2"  (1.88 m)   Wt 197 lb 6.4 oz (89.5 kg)   SpO2 98%   BMI 25.34 kg/m   Wt Readings from Last 3 Encounters:  04/25/22 197 lb 6.4 oz (89.5 kg)  02/28/22 194 lb 1.6 oz (88 kg)  12/29/21 194 lb 0.1 oz (88 kg)    Physical Exam Vitals and nursing note reviewed.  Constitutional:      General: He is awake. He is not in acute distress.    Appearance: He is well-developed and well-groomed. He is not ill-appearing or toxic-appearing.  HENT:     Head: Normocephalic and  atraumatic.     Right Ear: Hearing, ear canal and external ear normal. No drainage.     Left Ear: Hearing, ear canal and external ear normal. No drainage.  Eyes:     General: Lids are normal.        Right eye: No discharge.        Left eye: No discharge.     Conjunctiva/sclera: Conjunctivae normal.     Pupils: Pupils are equal, round, and reactive to light.  Neck:     Thyroid: No thyromegaly.     Vascular: No carotid bruit.  Cardiovascular:     Rate and Rhythm: Normal rate and regular rhythm.     Heart sounds: Normal heart sounds, S1 normal and S2 normal. No murmur heard.    No gallop.  Pulmonary:     Effort: Pulmonary effort is normal. No accessory muscle usage or respiratory distress.     Breath sounds: Normal breath sounds.  Abdominal:     General: Bowel sounds are normal.     Palpations: Abdomen is soft.  Musculoskeletal:        General: Normal range of motion.     Cervical back: Normal range of motion and neck supple.     Right lower leg: 1+ Edema present.     Left lower leg: 1+ Edema present.  Lymphadenopathy:     Cervical: No cervical adenopathy.  Skin:    General: Skin is warm and dry.     Capillary Refill: Capillary refill takes less than 2 seconds.     Findings: No rash.  Neurological:     Mental Status: He is alert.     Coordination: Coordination is intact.     Gait: Gait is intact.     Deep Tendon Reflexes: Reflexes are normal and symmetric.     Reflex Scores:      Brachioradialis reflexes are 2+ on the right side and 2+ on the left side.      Patellar reflexes are 2+ on the right side and 2+ on the left side.    Comments: Pleasantly confused, no able to report month, year, day.  Psychiatric:        Attention and Perception: Attention normal.        Mood and Affect: Mood normal.        Speech: Speech normal.        Behavior: Behavior normal. Behavior is cooperative.        Cognition and Memory: Memory is impaired.     Comments: Unable to report month, day,  year, or location.      Results for orders placed or performed during the hospital encounter of 03/30/22  Resp Panel by RT-PCR (Flu  A&B, Covid) Anterior Nasal Swab   Specimen: Anterior Nasal Swab  Result Value Ref Range   SARS Coronavirus 2 by RT PCR NEGATIVE NEGATIVE   Influenza A by PCR NEGATIVE NEGATIVE   Influenza B by PCR NEGATIVE NEGATIVE  Comprehensive metabolic panel  Result Value Ref Range   Sodium 142 135 - 145 mmol/L   Potassium 3.7 3.5 - 5.1 mmol/L   Chloride 109 98 - 111 mmol/L   CO2 27 22 - 32 mmol/L   Glucose, Bld 151 (H) 70 - 99 mg/dL   BUN 18 8 - 23 mg/dL   Creatinine, Ser 0.48 0.61 - 1.24 mg/dL   Calcium 8.8 (L) 8.9 - 10.3 mg/dL   Total Protein 6.9 6.5 - 8.1 g/dL   Albumin 3.3 (L) 3.5 - 5.0 g/dL   AST 20 15 - 41 U/L   ALT 17 0 - 44 U/L   Alkaline Phosphatase 58 38 - 126 U/L   Total Bilirubin 0.8 0.3 - 1.2 mg/dL   GFR, Estimated >88 >91 mL/min   Anion gap 6 5 - 15  Ethanol  Result Value Ref Range   Alcohol, Ethyl (B) <10 <10 mg/dL  Salicylate level  Result Value Ref Range   Salicylate Lvl <7.0 (L) 7.0 - 30.0 mg/dL  Acetaminophen level  Result Value Ref Range   Acetaminophen (Tylenol), Serum <10 (L) 10 - 30 ug/mL  cbc  Result Value Ref Range   WBC 5.2 4.0 - 10.5 K/uL   RBC 3.81 (L) 4.22 - 5.81 MIL/uL   Hemoglobin 11.4 (L) 13.0 - 17.0 g/dL   HCT 69.4 (L) 50.3 - 88.8 %   MCV 94.5 80.0 - 100.0 fL   MCH 29.9 26.0 - 34.0 pg   MCHC 31.7 30.0 - 36.0 g/dL   RDW 28.0 03.4 - 91.7 %   Platelets 271 150 - 400 K/uL   nRBC 0.0 0.0 - 0.2 %  Urine Drug Screen, Qualitative  Result Value Ref Range   Tricyclic, Ur Screen NONE DETECTED NONE DETECTED   Amphetamines, Ur Screen NONE DETECTED NONE DETECTED   MDMA (Ecstasy)Ur Screen NONE DETECTED NONE DETECTED   Cocaine Metabolite,Ur [place] NONE DETECTED NONE DETECTED   Opiate, Ur Screen NONE DETECTED NONE DETECTED   Phencyclidine (PCP) Ur S NONE DETECTED NONE DETECTED   Cannabinoid 50 Ng, Ur [place] NONE DETECTED NONE DETECTED    Barbiturates, Ur Screen NONE DETECTED NONE DETECTED   Benzodiazepine, Ur Scrn NONE DETECTED NONE DETECTED   Methadone Scn, Ur NONE DETECTED NONE DETECTED      Assessment & Plan:   Problem List Items Addressed This Visit       Cardiovascular and Mediastinum   Hypertension    Chronic, stable with BP at goal.  Does have dependent edema bilaterally, will reduce Amlodipine to 5 MG daily and see if benefit to this.  Discussed with caregiver.  Recommend she monitor BP at least a few mornings a week at home and document.  DASH diet at home.  Labs: CBC, CMP, TSH.  Return in 6 weeks.      Relevant Medications   amLODipine (NORVASC) 5 MG tablet   Other Relevant Orders   Basic metabolic panel   CBC with Differential/Platelet   TSH     Nervous and Auditory   Dementia with behavioral disturbance (HCC) - Primary    Chronic, progressive in nature.  Continue collaboration with neurology and current medication regimen as prescribed by them.  Could consider in future medication reduction with discontinuation  of Atorvastatin.  Refills on Melatonin and Mirtazapine sent.      Relevant Medications   divalproex (DEPAKOTE) 125 MG DR tablet   mirtazapine (REMERON) 15 MG tablet     Other   Prediabetes (Chronic)    Noted on past labs, recheck A1c today.  Initiate medication as needed.      Relevant Orders   HgB A1c   B12 deficiency    Chronic, ongoing.  Recheck B12 level today and continue daily oral supplement if low levels.      Hyperlipidemia, mixed    Chronic, ongoing.  Continue current medication regimen and adjust as needed.  May consider medication reduction and discontinuation of this in future.  Lipid panel today.       Relevant Medications   amLODipine (NORVASC) 5 MG tablet   Other Relevant Orders   Lipid Panel w/o Chol/HDL Ratio     Follow up plan: Return in about 6 weeks (around 06/06/2022) for Dementia and HTN.

## 2022-04-25 NOTE — Assessment & Plan Note (Signed)
Chronic, ongoing.  Recheck B12 level today and continue daily oral supplement if low levels.

## 2022-04-25 NOTE — Telephone Encounter (Signed)
Requested medication (s) are due for refill today: yes  Requested medication (s) are on the active medication list: historical med  Last refill:  03/30/22  Future visit scheduled: today  Notes to clinic:  historical provider and med   Requested Prescriptions  Pending Prescriptions Disp Refills   traZODone (DESYREL) 50 MG tablet [Pharmacy Med Name: TRAZODONE HCL 50 MG TAB] 30 tablet     Sig: TAKE 1 TABLET BY MOUTH AT BEDTIME     Psychiatry: Antidepressants - Serotonin Modulator Failed - 04/24/2022  9:03 AM      Failed - Valid encounter within last 6 months    Recent Outpatient Visits           7 months ago Mixed dementia (HCC)   [facility] [place], Dorie Rank, NP   9 months ago Encounter to establish care   [facility] [place], Dorie Rank, NP       Future Appointments             Today Marjie Skiff, NP [facility], [facility]   In 1 week  [facility], [facility]

## 2022-04-26 ENCOUNTER — Encounter: Payer: Self-pay | Admitting: Nurse Practitioner

## 2022-04-26 DIAGNOSIS — D649 Anemia, unspecified: Secondary | ICD-10-CM | POA: Insufficient documentation

## 2022-04-26 LAB — BASIC METABOLIC PANEL
BUN/Creatinine Ratio: 20 (ref 10–24)
BUN: 18 mg/dL (ref 8–27)
CO2: 25 mmol/L (ref 20–29)
Calcium: 9.5 mg/dL (ref 8.6–10.2)
Chloride: 103 mmol/L (ref 96–106)
Creatinine, Ser: 0.9 mg/dL (ref 0.76–1.27)
Glucose: 97 mg/dL (ref 70–99)
Potassium: 4.6 mmol/L (ref 3.5–5.2)
Sodium: 140 mmol/L (ref 134–144)
eGFR: 85 mL/min/{1.73_m2} (ref >59)

## 2022-04-26 LAB — HEMOGLOBIN A1C
Est. average glucose Bld gHb Est-mCnc: 143 mg/dL
Hgb A1c MFr Bld: 6.6 % — ABNORMAL HIGH (ref 4.8–5.6)

## 2022-04-26 LAB — LIPID PANEL W/O CHOL/HDL RATIO
Cholesterol, Total: 120 mg/dL (ref 100–199)
HDL: 50 mg/dL (ref >39)
LDL Chol Calc (NIH): 58 mg/dL (ref 0–99)
Triglycerides: 49 mg/dL (ref 0–149)
VLDL Cholesterol Cal: 12 mg/dL (ref 5–40)

## 2022-04-26 LAB — CBC WITH DIFFERENTIAL/PLATELET
Basophils Absolute: 0 10*3/uL (ref 0.0–0.2)
Basos: 1 %
EOS (ABSOLUTE): 0.1 10*3/uL (ref 0.0–0.4)
Eos: 2 %
Hematocrit: 36.4 % — ABNORMAL LOW (ref 37.5–51.0)
Hemoglobin: 11.7 g/dL — ABNORMAL LOW (ref 13.0–17.7)
Immature Grans (Abs): 0 10*3/uL (ref 0.0–0.1)
Immature Granulocytes: 0 %
Lymphocytes Absolute: 1.3 10*3/uL (ref 0.7–3.1)
Lymphs: 26 %
MCH: 29.5 pg (ref 26.6–33.0)
MCHC: 32.1 g/dL (ref 31.5–35.7)
MCV: 92 fL (ref 79–97)
Monocytes Absolute: 0.6 10*3/uL (ref 0.1–0.9)
Monocytes: 12 %
Neutrophils Absolute: 3 10*3/uL (ref 1.4–7.0)
Neutrophils: 59 %
Platelets: 313 10*3/uL (ref 150–450)
RBC: 3.97 x10E6/uL — ABNORMAL LOW (ref 4.14–5.80)
RDW: 13.2 % (ref 11.6–15.4)
WBC: 5.2 10*3/uL (ref 3.4–10.8)

## 2022-04-26 LAB — TSH: TSH: 1.38 u[IU]/mL (ref 0.450–4.500)

## 2022-04-26 NOTE — Progress Notes (Signed)
Good morning, please let Larry Buckley caregiver know that his labs have returned: - A1c, diabetes testing, is now very slightly in diabetic range at 6.6%.  Any number 6.5% or greater is considered diabetes.  Due to his dementia we will monitor this, but at this time recommend diet focus only (less sweets and carbohydrates) to avoid adding on further medications with his dementia.  We will recheck in 3 months and if it trends up may consider adding on medication for this. - CBC continues to show mild anemia, but no worsening.  We will continue to monitor this at visits.  Ensure plenty of green, leafy vegetables in diet. - Remainder of labs are stable, continue current medication regimen.  Any questions? Keep being awesome!!  Thank you for allowing me to participate in your care.  I appreciate you. Kindest regards, Mahdiya Mossberg

## 2022-05-01 ENCOUNTER — Ambulatory Visit: Payer: Self-pay

## 2022-05-01 NOTE — Telephone Encounter (Signed)
  Chief Complaint: Depakote is not effective Symptoms: Up all night Frequency: ongoing Pertinent Negatives: Patient denies  Disposition: [] ED /[] Urgent Care (no appt availability in office) / [] Appointment(In office/virtual)/ []  [facility]/ [] Home Care/ [] Refused Recommended Disposition /[] [facility]/ [x]  Follow-up with PCP Additional Notes: Called back . 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  reports that the pt is up all night, and is very active. reports that the Depakote is not effective for pt. states that unless a different medication can be given pt will need to placed somewhere else. Reason for Disposition  Prescription request for new medicine (not a refill)  Answer Assessment - Initial Assessment Questions 1. NAME of MEDICINE: "What medicine(s) are you calling about?"     Depakote 2. QUESTION: "What is your question?" (e.g., double dose of medicine, side effect)     No effective for pt 3. PRESCRIBER: "Who prescribed the medicine?" Reason: if prescribed by specialist, call should be referred to that group.     Jolene 4. SYMPTOMS: "Do you have any symptoms?" If Yes, ask: "What symptoms are you having?"  "How bad are the symptoms (e.g., mild, moderate, severe)     Still very active at night 5. PREGNANCY:  "Is there any chance that you are pregnant?" "When was your last menstrual period?"     na  Protocols used: Medication Question Call-A-AH  Summary: depakote   Pt was prescribed divalproex (DEPAKOTE) 125 MG DR tablet / this is not helping at all/ pt is still up at night tearing up the house and into everything / please advise

## 2022-05-01 NOTE — Telephone Encounter (Signed)
Summary: depakote   Pt was prescribed divalproex (DEPAKOTE) 125 MG DR tablet / this is not helping at all/ pt is still up at night tearing up the house and into everything / please advise

## 2022-05-02 NOTE — Telephone Encounter (Signed)
Spoke with [name] (stepdaughter) and made her aware of Jolene's recommendations. [name] says she is going to make [facility]giver [facility] aware of the recommendations. Advised to give our office a call if any concerns or questions.

## 2022-05-04 ENCOUNTER — Telehealth: Payer: Self-pay | Admitting: Nurse Practitioner

## 2022-05-04 ENCOUNTER — Ambulatory Visit: Payer: Medicare PPO

## 2022-05-04 NOTE — Telephone Encounter (Signed)
Spoke with Corrie Dandy with [facility] and made her aware of Jolene's recommendations. As, she says she did speak with patient's stepdaughter, but wanted to hear the recommendations from the provider's office. Mary verbalized understanding and was provided Neurologist contact information.

## 2022-05-04 NOTE — Telephone Encounter (Signed)
Noted  

## 2022-05-04 NOTE — Telephone Encounter (Signed)
Please call Larry Buckley at [facility]  [phone], she has questions and concerns about medication Depakote and would like a nurse to call her today. She states she called a few days ago and has not heard back.

## 2022-05-08 ENCOUNTER — Other Ambulatory Visit: Payer: Self-pay | Admitting: Neurology

## 2022-05-08 DIAGNOSIS — R29810 Facial weakness: Secondary | ICD-10-CM

## 2022-05-09 ENCOUNTER — Ambulatory Visit (INDEPENDENT_AMBULATORY_CARE_PROVIDER_SITE_OTHER): Payer: Medicare PPO | Admitting: *Deleted

## 2022-05-09 DIAGNOSIS — Z0000 Encounter for general adult medical examination without abnormal findings: Secondary | ICD-10-CM

## 2022-05-09 NOTE — Patient Instructions (Signed)
Larry Buckley , Thank you for taking time to come for your Medicare Wellness Visit. I appreciate your ongoing commitment to your health goals. Please review the following plan we discussed and let me know if I can assist you in the future.   Screening recommendations/referrals: Colonoscopy: no longer required Recommended yearly ophthalmology/optometry visit for glaucoma screening and checkup Recommended yearly dental visit for hygiene and checkup  Vaccinations: Influenza vaccine: up to date Pneumococcal vaccine: due  Education provided Tdap vaccine:due  Education provided Shingles vaccine: Education provided    Advanced directives: Education provided  Conditions/risks identified:   Preventive Care 65 Years and Older, Male Preventive care refers to lifestyle choices and visits with your health care provider that can promote health and wellness. What does preventive care include? A yearly physical exam. This is also called an annual well check. Dental exams once or twice a year. Routine eye exams. Ask your health care provider how often you should have your eyes checked. Personal lifestyle choices, including: Daily care of your teeth and gums. Regular physical activity. Eating a healthy diet. Avoiding tobacco and drug use. Limiting alcohol use. Practicing safe sex. Taking low doses of aspirin every day. Taking vitamin and mineral supplements as recommended by your health care provider. What happens during an annual well check? The services and screenings done by your health care provider during your annual well check will depend on your age, overall health, lifestyle risk factors, and family history of disease. Counseling  Your health care provider may ask you questions about your: Alcohol use. Tobacco use. Drug use. Emotional well-being. Home and relationship well-being. Sexual activity. Eating habits. History of falls. Memory and ability to understand (cognition). Work and  work Astronomer. Screening  You may have the following tests or measurements: Height, weight, and BMI. Blood pressure. Lipid and cholesterol levels. These may be checked every 5 years, or more frequently if you are over 36 years old. Skin check. Lung cancer screening. You may have this screening every year starting at age 79 if you have a 30-pack-year history of smoking and currently smoke or have quit within the past 15 years. Fecal occult blood test (FOBT) of the stool. You may have this test every year starting at age 70. Flexible sigmoidoscopy or colonoscopy. You may have a sigmoidoscopy every 5 years or a colonoscopy every 10 years starting at age 34. Prostate cancer screening. Recommendations will vary depending on your family history and other risks. Hepatitis C blood test. Hepatitis B blood test. Sexually transmitted disease (STD) testing. Diabetes screening. This is done by checking your blood sugar (glucose) after you have not eaten for a while (fasting). You may have this done every 1-3 years. Abdominal aortic aneurysm (AAA) screening. You may need this if you are a current or former smoker. Osteoporosis. You may be screened starting at age 51 if you are at high risk. Talk with your health care provider about your test results, treatment options, and if necessary, the need for more tests. Vaccines  Your health care provider may recommend certain vaccines, such as: Influenza vaccine. This is recommended every year. Tetanus, diphtheria, and acellular pertussis (Tdap, Td) vaccine. You may need a Td booster every 10 years. Zoster vaccine. You may need this after age 68. Pneumococcal 13-valent conjugate (PCV13) vaccine. One dose is recommended after age 72. Pneumococcal polysaccharide (PPSV23) vaccine. One dose is recommended after age 85. Talk to your health care provider about which screenings and vaccines you need and how often you  need them. This information is not intended to  replace advice given to you by your health care provider. Make sure you discuss any questions you have with your health care provider. Document Released: 10/29/2015 Document Revised: 06/21/2016 Document Reviewed: 08/03/2015 Elsevier Interactive Patient Education  2017 [place] Prevention in the Home Falls can cause injuries. They can happen to people of all ages. There are many things you can do to make your home safe and to help prevent falls. What can I do on the outside of my home? Regularly fix the edges of walkways and driveways and fix any cracks. Remove anything that might make you trip as you walk through a door, such as a raised step or threshold. Trim any bushes or trees on the path to your home. Use bright outdoor lighting. Clear any walking paths of anything that might make someone trip, such as rocks or tools. Regularly check to see if handrails are loose or broken. Make sure that both sides of any steps have handrails. Any raised decks and porches should have guardrails on the edges. Have any leaves, snow, or ice cleared regularly. Use sand or salt on walking paths during winter. Clean up any spills in your garage right away. This includes oil or grease spills. What can I do in the bathroom? Use night lights. Install grab bars by the toilet and in the tub and shower. Do not use towel bars as grab bars. Use non-skid mats or decals in the tub or shower. If you need to sit down in the shower, use a plastic, non-slip stool. Keep the floor dry. Clean up any water that spills on the floor as soon as it happens. Remove soap buildup in the tub or shower regularly. Attach bath mats securely with double-sided non-slip rug tape. Do not have throw rugs and other things on the floor that can make you trip. What can I do in the bedroom? Use night lights. Make sure that you have a light by your bed that is easy to reach. Do not use any sheets or blankets that are too big for  your bed. They should not hang down onto the floor. Have a firm chair that has side arms. You can use this for support while you get dressed. Do not have throw rugs and other things on the floor that can make you trip. What can I do in the kitchen? Clean up any spills right away. Avoid walking on wet floors. Keep items that you use a lot in easy-to-reach places. If you need to reach something above you, use a strong step stool that has a grab bar. Keep electrical cords out of the way. Do not use floor polish or wax that makes floors slippery. If you must use wax, use non-skid floor wax. Do not have throw rugs and other things on the floor that can make you trip. What can I do with my stairs? Do not leave any items on the stairs. Make sure that there are handrails on both sides of the stairs and use them. Fix handrails that are broken or loose. Make sure that handrails are as long as the stairways. Check any carpeting to make sure that it is firmly attached to the stairs. Fix any carpet that is loose or worn. Avoid having throw rugs at the top or bottom of the stairs. If you do have throw rugs, attach them to the floor with carpet tape. Make sure that you have a light switch  at the top of the stairs and the bottom of the stairs. If you do not have them, ask someone to add them for you. What else can I do to help prevent falls? Wear shoes that: Do not have high heels. Have rubber bottoms. Are comfortable and fit you well. Are closed at the toe. Do not wear sandals. If you use a stepladder: Make sure that it is fully opened. Do not climb a closed stepladder. Make sure that both sides of the stepladder are locked into place. Ask someone to hold it for you, if possible. Clearly mark and make sure that you can see: Any grab bars or handrails. First and last steps. Where the edge of each step is. Use tools that help you move around (mobility aids) if they are needed. These  include: Canes. Walkers. Scooters. Crutches. Turn on the lights when you go into a dark area. Replace any light bulbs as soon as they burn out. Set up your furniture so you have a clear path. Avoid moving your furniture around. If any of your floors are uneven, fix them. If there are any pets around you, be aware of where they are. Review your medicines with your doctor. Some medicines can make you feel dizzy. This can increase your chance of falling. Ask your doctor what other things that you can do to help prevent falls. This information is not intended to replace advice given to you by your health care provider. Make sure you discuss any questions you have with your health care provider. Document Released: 07/29/2009 Document Revised: 03/09/2016 Document Reviewed: 11/06/2014 Elsevier Interactive Patient Education  2017 [employer].

## 2022-05-09 NOTE — Progress Notes (Signed)
Subjective:   Larry Buckley is a 81 y.o. male who presents for Medicare Annual/Subsequent preventive examination.  I connected with  Larry Buckley on 05/09/22 by a  telephone Larry Buckley is patient care giver patient is present confirmed DOB. Does have memory issues enabled telemedicine application and verified that I am speaking with the correct person using two identifiers.   I discussed the limitations of evaluation and management by telemedicine. The patient expressed understanding and agreed to proceed.  Patient location: home  Provider location: Tele-Health-home    Review of Systems     Cardiac Risk Factors include: advanced age (>42men, >49 women);male gender;obesity (BMI >30kg/m2);sedentary lifestyle;family history of premature cardiovascular disease     Objective:    Today's Vitals   There is no height or weight on file to calculate BMI.     05/09/2022   11:05 AM 03/30/2022    1:51 PM 02/09/2022   11:11 PM 12/28/2021   10:09 PM 05/08/2017    9:18 AM 08/04/2016    9:30 AM 02/09/2016   12:35 AM  Advanced Directives  Does Patient Have a Medical Advance Directive? No No No No No Yes No  Type of Advance Directive      Living will   Would patient like information on creating a medical advance directive? No - Patient declined      No - patient declined information    Current Medications (verified) Outpatient Encounter Medications as of 05/09/2022  Medication Sig   amLODipine (NORVASC) 5 MG tablet Take 1 tablet (5 mg total) by mouth daily.   aspirin EC 81 MG tablet Take 1 tablet (81 mg total) by mouth daily. Swallow whole.   atorvastatin (LIPITOR) 20 MG tablet Take 1 tablet (20 mg total) by mouth daily.   divalproex (DEPAKOTE SPRINKLE) 125 MG capsule Take 1 capsule (125 mg total) by mouth every 12 (twelve) hours.   divalproex (DEPAKOTE) 125 MG DR tablet Take by mouth.   Melatonin 10 MG SUBL Place 1 tablet under the tongue at bedtime.   mirtazapine (REMERON) 15 MG tablet  Take 1 tablet (15 mg total) by mouth at bedtime.   risperiDONE (RISPERDAL) 0.25 MG tablet Take 1 tablet (0.25 mg total) by mouth 2 (two) times daily as needed (agitation).   risperiDONE (RISPERDAL) 0.5 MG tablet Take 1 tablet (0.5 mg total) by mouth 2 (two) times daily.   No facility-administered encounter medications on file as of 05/09/2022.    Allergies (verified) Lotensin [benazepril hcl]   History: Past Medical History:  Diagnosis Date   AAA (abdominal aortic aneurysm) (HCC)    Dementia (HCC) 05/21/2020   Gout, arthropathy    Hyperlipidemia    Hypertension    Past Surgical History:  Procedure Laterality Date   PERIPHERAL VASCULAR CATHETERIZATION N/A 02/08/2016   Procedure: Endovascular Repair/Stent Graft;  Surgeon: Annice Needy, MD;  Location: [facility] INVASIVE CV LAB;  Service: Cardiovascular;  Laterality: N/A;   Family History  Problem Relation Age of Onset   Cancer Mother    Diabetes Mother    Heart disease Father    Hypertension Father    Heart disease Brother    Social History   Socioeconomic History   Marital status: Widowed    Spouse name: Not on file   Number of children: Not on file   Years of education: Not on file   Highest education level: Not on file  Occupational History   Not on file  Tobacco Use   Smoking status:  Former   Smokeless tobacco: Never  Substance and Sexual Activity   Alcohol use: Yes   Drug use: No   Sexual activity: Not Currently  Other Topics Concern   Not on file  Social History Narrative   Not on file   Social Determinants of Health   Financial Resource Strain: Low Risk  (05/09/2022)   Overall Financial Resource Strain (CARDIA)    Difficulty of Paying Living Expenses: Not hard at all  Food Insecurity: No Food Insecurity (05/09/2022)   Hunger Vital Sign    Worried About Running Out of Food in the Last Year: Never true    Ran Out of Food in the Last Year: Never true  Transportation Needs: No Transportation Needs (05/09/2022)    PRAPARE - Administrator, Civil Service (Medical): No    Lack of Transportation (Non-Medical): No  Physical Activity: Inactive (05/09/2022)   Exercise Vital Sign    Days of Exercise per Week: 0 days    Minutes of Exercise per Session: 0 min  Stress: No Stress Concern Present (05/09/2022)   Larry Buckley [employer] Occupational [employer] - Occupational Stress Questionnaire    Feeling of Stress : Not at all  Social Connections: Socially Isolated (05/09/2022)   Social Connection and Isolation Panel [NHANES]    Frequency of Communication with Friends and Family: Never    Frequency of Social Gatherings with Friends and Family: Never    Attends Religious Services: Never    Database administrator or Organizations: No    Attends Banker Meetings: Never    Marital Status: Widowed    Tobacco Counseling Counseling given: Not Answered   Clinical Intake:  Pre-visit preparation completed: Yes  Pain : No/denies pain     Nutritional Risks: None Diabetes: No  How often do you need to have someone help you when you read instructions, pamphlets, or other written materials from your doctor or pharmacy?: 5 - Always  Diabetic?  no  Interpreter Needed?: No  Information entered by :: Larry Buckley   Activities of Daily Living    05/09/2022   11:05 AM 07/11/2021   10:32 AM  In your present state of health, do you have any difficulty performing the following activities:  Hearing? 0 0  Vision? 0 0  Difficulty concentrating or making decisions? 1 1  Walking or climbing stairs? 1 0  Dressing or bathing? 1 0  Doing errands, shopping? 1 0  Preparing Food and eating ? Y   Using the Toilet? Y   In the past six months, have you accidently leaked urine? Y   Do you have problems with loss of bowel control? N   Managing your Medications? Y   Managing your Finances? Y   Housekeeping or managing your Housekeeping? Y     Patient Care Team: Larry Skiff, NP as PCP -  General (Nurse Practitioner) Larry Larsson, LCSW as Social Worker (Licensed Clinical Social Worker)  Indicate any recent Medical Services you may have received from other than [facility] providers in the past year (date may be approximate).     Assessment:   This is a routine wellness examination for Larry Buckley.  Hearing/Vision screen Hearing Screening - Comments:: Does not wear hearing aids Vision Screening - Comments:: Not up to date  Dietary issues and exercise activities discussed: Current Exercise Habits: The patient does not participate in regular exercise at present, Exercise limited by: None identified   Goals Addressed  This Visit's Progress    Patient Stated       No goals       Depression Screen    04/25/2022    1:56 PM 07/11/2021   10:32 AM  PHQ 2/9 Scores  PHQ - 2 Score 0 0  PHQ- 9 Score 6     Fall Risk    05/09/2022   11:06 AM 04/25/2022    1:55 PM 07/11/2021   10:32 AM  Fall Risk   Falls in the past year? 0 0 0  Number falls in past yr: 0 0 0  Injury with Fall? 0 0 0  Risk for fall due to :  No Fall Risks No Fall Risks  Follow up  Falls evaluation completed Falls evaluation completed    FALL RISK PREVENTION PERTAINING TO THE HOME:  Any stairs in or around the home? No  If so, are there any without handrails? No  Home free of loose throw rugs in walkways, pet beds, electrical cords, etc? Yes  Adequate lighting in your home to reduce risk of falls? Yes   ASSISTIVE DEVICES UTILIZED TO PREVENT FALLS:  Life alert? No  Use of a cane, walker or w/c? No  Grab bars in the bathroom? Yes  Shower chair or bench in shower? Yes  Elevated toilet seat or a handicapped toilet? Yes   TIMED UP AND GO:  Was the test performed? No .    Cognitive Function:        07/11/2021   10:54 AM  6CIT Screen  What Year? 4 points  What month? 3 points  What time? 3 points  Count back from 20 0 points  Months in reverse 4 points  Repeat phrase 8 points   Total Score 22 points    Immunizations Immunization History  Administered Date(s) Administered   Influenza, High Dose Seasonal PF 09/22/2018   Influenza-Unspecified 06/09/2021   PFIZER(Purple Top)SARS-COV-2 Vaccination 10/23/2019, 11/15/2019, 06/09/2021   PPD Test 01/04/2022   Pneumococcal Polysaccharide-23 02/27/2007   Td 11/09/2004   Zoster, Live 02/27/2007    TDAP status: Due, Education has been provided regarding the importance of this vaccine. Advised may receive this vaccine at local pharmacy or Health Dept. Aware to provide a copy of the vaccination record if obtained from local pharmacy or Health Dept. Verbalized acceptance and understanding.  Flu Vaccine status: Up to date  Pneumococcal vaccine status: Due, Education has been provided regarding the importance of this vaccine. Advised may receive this vaccine at local pharmacy or Health Dept. Aware to provide a copy of the vaccination record if obtained from local pharmacy or Health Dept. Verbalized acceptance and understanding.  Covid-19 vaccine status: Information provided on how to obtain vaccines.   Qualifies for Shingles Vaccine? Yes   Zostavax completed Yes   Shingrix Completed?: No.    Education has been provided regarding the importance of this vaccine. Patient has been advised to call insurance company to determine out of pocket expense if they have not yet received this vaccine. Advised may also receive vaccine at local pharmacy or Health Dept. Verbalized acceptance and understanding.  Screening Tests Health Maintenance  Topic Date Due   Pneumonia Vaccine 71+ Years old (2 - PCV) 02/27/2008   COVID-19 Vaccine (4 - Pfizer series) 05/25/2022 (Originally 08/04/2021)   Zoster Vaccines- Shingrix (1 of 2) 08/09/2022 (Originally 03/10/1990)   TETANUS/TDAP  05/10/2023 (Originally 11/09/2014)   INFLUENZA VACCINE  05/16/2022   HPV VACCINES  Aged Out    Health Maintenance  Health Maintenance Due  Topic Date Due    Pneumonia Vaccine 42+ Years old (2 - PCV) 02/27/2008    Colorectal cancer screening: No longer required.   Lung Cancer Screening: (Low Dose CT Chest recommended if Age 67-80 years, 30 pack-year currently smoking OR have quit w/in 15years.) does not qualify.   Lung Cancer Screening Referral:   Additional Screening:  Hepatitis C Screening: does qualify;  Vision Screening: Recommended annual ophthalmology exams for early detection of glaucoma and other disorders of the eye. Is the patient up to date with their annual eye exam?  No  Who is the provider or what is the name of the office in which the patient attends annual eye exams?  If pt is not established with a provider, would they like to be referred to a provider to establish care? No .   Dental Screening: Recommended annual dental exams for proper oral hygiene  Community Resource Referral / Chronic Care Management: CRR required this visit?  No   CCM required this visit?  No      Plan:     I have personally reviewed and noted the following in the patient's chart:   Medical and social history Use of alcohol, tobacco or illicit drugs  Current medications and supplements including opioid prescriptions. Patient is not currently taking opioid prescriptions. Functional ability and status Nutritional status Physical activity Advanced directives List of other physicians Hospitalizations, surgeries, and ER visits in previous 12 months Vitals Screenings to include cognitive, depression, and falls Referrals and appointments  In addition, I have reviewed and discussed with patient certain preventive protocols, quality metrics, and best practice recommendations. A written personalized care plan for preventive services as well as general preventive health recommendations were provided to patient.     Larry Haggard, Buckley   8/75/6433   Nurse Notes: patient is living with a [facility] caregiver in process of trying to get permanent  placement in an Alzheimer facility, patient has one family member does not visit patient often.  Larry Buckley states he will be a ward of state once go to court in August.

## 2022-05-11 DIAGNOSIS — G301 Alzheimer's disease with late onset: Secondary | ICD-10-CM | POA: Diagnosis not present

## 2022-05-11 DIAGNOSIS — Z79899 Other long term (current) drug therapy: Secondary | ICD-10-CM | POA: Diagnosis not present

## 2022-05-11 DIAGNOSIS — R29810 Facial weakness: Secondary | ICD-10-CM | POA: Diagnosis not present

## 2022-05-11 DIAGNOSIS — F02B2 Dementia in other diseases classified elsewhere, moderate, with psychotic disturbance: Secondary | ICD-10-CM | POA: Diagnosis not present

## 2022-05-11 DIAGNOSIS — Z113 Encounter for screening for infections with a predominantly sexual mode of transmission: Secondary | ICD-10-CM | POA: Diagnosis not present

## 2022-05-12 ENCOUNTER — Telehealth: Payer: Self-pay | Admitting: Student

## 2022-05-17 ENCOUNTER — Inpatient Hospital Stay: Admission: RE | Admit: 2022-05-17 | Payer: Medicare HMO | Source: Ambulatory Visit

## 2022-05-17 ENCOUNTER — Telehealth: Payer: Self-pay | Admitting: Student

## 2022-05-17 NOTE — Telephone Encounter (Signed)
Spoke with Gilman Buttner (Director of [facility]) about scheduling a Palliative f/u visit with patient and she stated that the patient needs to go to a Memory Care Facility and I told her that if we could schedule a visit with patient that we could possibly help with that but she would not schedule visit.  She stated that MD has put patient on a new med and agreed for me to call her back next week to see how patient was doing and to possibly schedule a visit.

## 2022-05-17 NOTE — Telephone Encounter (Signed)
Attempted to contact Gilman Buttner (Director of [facility]) to f/u with her to see how patient was doine and to see if we could schedule a visit, left VM requesting a return call ASAP.

## 2022-05-22 ENCOUNTER — Other Ambulatory Visit: Payer: Medicare HMO

## 2022-05-25 ENCOUNTER — Encounter: Payer: Self-pay | Admitting: Emergency Medicine

## 2022-05-25 ENCOUNTER — Other Ambulatory Visit: Payer: Self-pay

## 2022-05-25 ENCOUNTER — Emergency Department
Admission: EM | Admit: 2022-05-25 | Discharge: 2022-05-26 | Disposition: A | Discharge status: Home / Self Care | Payer: Medicare PPO | Attending: Student in an Organized Health Care Education/Training Program | Admitting: Student in an Organized Health Care Education/Training Program

## 2022-05-25 DIAGNOSIS — F03911 Unspecified dementia, unspecified severity, with agitation: Secondary | ICD-10-CM | POA: Diagnosis not present

## 2022-05-25 DIAGNOSIS — D649 Anemia, unspecified: Secondary | ICD-10-CM | POA: Diagnosis present

## 2022-05-25 DIAGNOSIS — Z79899 Other long term (current) drug therapy: Secondary | ICD-10-CM | POA: Insufficient documentation

## 2022-05-25 DIAGNOSIS — Z20822 Contact with and (suspected) exposure to covid-19: Secondary | ICD-10-CM | POA: Insufficient documentation

## 2022-05-25 DIAGNOSIS — F03918 Unspecified dementia, unspecified severity, with other behavioral disturbance: Secondary | ICD-10-CM

## 2022-05-25 DIAGNOSIS — R7303 Prediabetes: Secondary | ICD-10-CM | POA: Diagnosis present

## 2022-05-25 DIAGNOSIS — M109 Gout, unspecified: Secondary | ICD-10-CM | POA: Diagnosis present

## 2022-05-25 DIAGNOSIS — E538 Deficiency of other specified B group vitamins: Secondary | ICD-10-CM | POA: Diagnosis present

## 2022-05-25 DIAGNOSIS — I10 Essential (primary) hypertension: Secondary | ICD-10-CM | POA: Diagnosis present

## 2022-05-25 DIAGNOSIS — F0390 Unspecified dementia without behavioral disturbance: Secondary | ICD-10-CM | POA: Diagnosis not present

## 2022-05-25 DIAGNOSIS — R451 Restlessness and agitation: Secondary | ICD-10-CM | POA: Diagnosis not present

## 2022-05-25 DIAGNOSIS — E782 Mixed hyperlipidemia: Secondary | ICD-10-CM | POA: Diagnosis present

## 2022-05-25 LAB — CBC
HCT: 37.6 % — ABNORMAL LOW (ref 39.0–52.0)
Hemoglobin: 12.3 g/dL — ABNORMAL LOW (ref 13.0–17.0)
MCH: 30.8 pg (ref 26.0–34.0)
MCHC: 32.7 g/dL (ref 30.0–36.0)
MCV: 94 fL (ref 80.0–100.0)
Platelets: 238 10*3/uL (ref 150–400)
RBC: 4 MIL/uL — ABNORMAL LOW (ref 4.22–5.81)
RDW: 13.5 % (ref 11.5–15.5)
WBC: 4.3 10*3/uL (ref 4.0–10.5)
nRBC: 0 % (ref 0.0–0.2)

## 2022-05-25 LAB — RESP PANEL BY RT-PCR (FLU A&B, COVID) ARPGX2
Influenza A by PCR: NEGATIVE
Influenza B by PCR: NEGATIVE
SARS Coronavirus 2 by RT PCR: NEGATIVE

## 2022-05-25 LAB — COMPREHENSIVE METABOLIC PANEL
ALT: 14 U/L (ref 0–44)
AST: 18 U/L (ref 15–41)
Albumin: 3.4 g/dL — ABNORMAL LOW (ref 3.5–5.0)
Alkaline Phosphatase: 49 U/L (ref 38–126)
Anion gap: 5 (ref 5–15)
BUN: 19 mg/dL (ref 8–23)
CO2: 29 mmol/L (ref 22–32)
Calcium: 9 mg/dL (ref 8.9–10.3)
Chloride: 105 mmol/L (ref 98–111)
Creatinine, Ser: 0.9 mg/dL (ref 0.61–1.24)
GFR, Estimated: >60 mL/min (ref >60)
Glucose, Bld: 96 mg/dL (ref 70–99)
Potassium: 3.9 mmol/L (ref 3.5–5.1)
Sodium: 139 mmol/L (ref 135–145)
Total Bilirubin: 0.6 mg/dL (ref 0.3–1.2)
Total Protein: 7.4 g/dL (ref 6.5–8.1)

## 2022-05-25 LAB — ACETAMINOPHEN LEVEL: Acetaminophen (Tylenol), Serum: <10 ug/mL — ABNORMAL LOW (ref 10–30)

## 2022-05-25 LAB — SALICYLATE LEVEL: Salicylate Lvl: <7 mg/dL — ABNORMAL LOW (ref 7.0–30.0)

## 2022-05-25 LAB — ETHANOL: Alcohol, Ethyl (B): <10 mg/dL (ref <10)
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
## 2022-05-26 ENCOUNTER — Other Ambulatory Visit: Payer: Medicare PPO | Admitting: Student

## 2022-05-26 DIAGNOSIS — Z515 Encounter for palliative care: Secondary | ICD-10-CM

## 2022-05-26 DIAGNOSIS — F03911 Unspecified dementia, unspecified severity, with agitation: Secondary | ICD-10-CM | POA: Diagnosis not present

## 2022-05-26 DIAGNOSIS — G301 Alzheimer's disease with late onset: Secondary | ICD-10-CM

## 2022-05-26 MED ORDER — RISPERIDONE 1 MG PO TABS
1.0000 mg | ORAL_TABLET | Freq: Once | ORAL | Status: AC
  Administered 2022-05-26: 1 mg via ORAL
  Filled 2022-05-26: qty 1

## 2022-05-26 MED ORDER — ASPIRIN 81 MG PO TBEC: 81.0000 mg | DELAYED_RELEASE_TABLET | Freq: Every day | ORAL | Status: DC

## 2022-05-26 MED ORDER — ATORVASTATIN CALCIUM 20 MG PO TABS: 20.0000 mg | ORAL_TABLET | Freq: Every day | ORAL | Status: DC

## 2022-05-26 MED ORDER — DIVALPROEX SODIUM 500 MG PO DR TAB
500.0000 mg | DELAYED_RELEASE_TABLET | Freq: Two times a day (BID) | ORAL | Status: DC
  Administered 2022-05-26: 500 mg via ORAL
  Filled 2022-05-26: qty 1

## 2022-05-26 MED ORDER — RISPERIDONE 1 MG PO TABS: 0.5000 mg | ORAL_TABLET | Freq: Two times a day (BID) | ORAL | Status: DC

## 2022-05-26 MED ORDER — MELATONIN 5 MG PO TABS
10.0000 mg | ORAL_TABLET | Freq: Every day | ORAL | Status: DC
  Administered 2022-05-26: 10 mg via ORAL
  Filled 2022-05-26: qty 2

## 2022-05-26 MED ORDER — AMLODIPINE BESYLATE 5 MG PO TABS: 5.0000 mg | ORAL_TABLET | Freq: Every day | ORAL | Status: DC

## 2022-05-26 MED ORDER — MIRTAZAPINE 15 MG PO TABS
15.0000 mg | ORAL_TABLET | Freq: Every day | ORAL | Status: DC
  Administered 2022-05-26: 15 mg via ORAL
  Filled 2022-05-26: qty 1
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
## 2022-05-26 NOTE — ED Provider Notes (Signed)
Patient seen and evaluated by psychiatry service who feels that he is not at this time meeting criteria for inpatient admission and recommendation outpatient follow-up.  Patient discharged in stable condition.   Gilles Chiquito, MD 05/26/22 [phone]
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
## 2022-05-26 NOTE — ED Notes (Signed)
This Clinical research associate called group home 2x and TTS tried to call 2x as well with no answer.   [place] called to send wellness check on [facility].

## 2022-05-26 NOTE — BH Assessment (Signed)
This Clinical research associate attempted to contact Gilman Buttner of [facility] [phone]) to arrange transportation back to the group home; however there was no answer. This Clinical research associate left a voicemail requesting a call back.  Morrow,Kailee (Legal Guardian/DSS adult care supervisor) [phone] This writer left a voicemail requesting a call back.

## 2022-05-30 ENCOUNTER — Telehealth: Payer: Self-pay | Admitting: *Deleted

## 2022-05-30 NOTE — Telephone Encounter (Signed)
  Spoke with patient care giver, he is in a SNF now   [phone]  [facility]

## 2022-06-05 ENCOUNTER — Ambulatory Visit: Payer: Self-pay | Admitting: *Deleted

## 2022-06-05 NOTE — Telephone Encounter (Signed)
Reason for Disposition . [1] Large swelling or bruise (> 2 inches or 5 cm) AND [2] able to bear weight  Answer Assessment - Initial Assessment Questions 1. MECHANISM: "How did the injury happen?" (e.g., twisting injury, direct blow)      Fell in bathroom- not witnessed 2. ONSET: "When did the injury happen?" (Minutes or hours ago)      yesterday 3. LOCATION: "Where is the injury located?"      Left hip 4. APPEARANCE of INJURY: "What does the injury look like?"  (e.g., deformity of leg)     Bruise on hip- slight swelling at top of hip- bruise reports 2 finger widths 5. SEVERITY: "Can you put weight on that leg?" "Can you walk?"      yes 6. SIZE: For cuts, bruises, or swelling, ask: "How large is it?" (e.g., inches or centimeters;  entire joint)      *No Answer* 7. PAIN: "Is there pain?" If Yes, ask: "How bad is the pain?"   "What does it keep you from doing?" (e.g., Scale 1-10; or mild, moderate, severe)   -  NONE: (0): no pain   -  MILD (1-3): doesn't interfere with normal activities    -  MODERATE (4-7): interferes with normal activities (e.g., work or school) or awakens from sleep, limping    -  SEVERE (8-10): excruciating pain, unable to do any normal activities, unable to walk     mild 8. TETANUS: For any breaks in the skin, ask: "When was the last tetanus booster?"     *No Answer* 9. OTHER SYMPTOMS: "Do you have any other symptoms?"      Scrape at bruise 10. PREGNANCY: "Is there any chance you are pregnant?" "When was your last menstrual period?"       *No Answer*  Protocols used: Hip Injury-A-AH

## 2022-06-05 NOTE — Telephone Encounter (Signed)
  Chief Complaint: fall- hip injury Symptoms: bruising on left hip Frequency: fell yesterday- was found on floor of bathroom Pertinent Negatives: Patient denies pain- not limping Disposition: [] ED /[] Urgent Care (no appt availability in office) / [x] Appointment(In office/virtual)/ []  [facility]/ [] Home Care/ [] Refused Recommended Disposition /[] [facility]/ []  Follow-up with PCP Additional Notes: Offered appointment today- caregiver can not come today- scheduled per preference-  advised UC/ED for any changes

## 2022-06-05 NOTE — Telephone Encounter (Signed)
Noted  

## 2022-06-08 ENCOUNTER — Ambulatory Visit
Admission: RE | Admit: 2022-06-08 | Discharge: 2022-06-08 | Disposition: A | Discharge status: Home / Self Care | Payer: Medicare PPO | Source: Ambulatory Visit | Attending: Nurse Practitioner | Admitting: Nurse Practitioner

## 2022-06-08 ENCOUNTER — Ambulatory Visit
Admission: RE | Admit: 2022-06-08 | Discharge: 2022-06-08 | Disposition: A | Discharge status: Home / Self Care | Payer: Medicare PPO | Attending: Nurse Practitioner | Admitting: Nurse Practitioner

## 2022-06-08 ENCOUNTER — Ambulatory Visit: Payer: Medicare PPO | Admitting: Nurse Practitioner

## 2022-06-08 ENCOUNTER — Encounter: Payer: Self-pay | Admitting: Nurse Practitioner

## 2022-06-08 VITALS — BP 106/62 | HR 58 | Temp 98.5°F | Ht 74.0 in | Wt 185.5 lb

## 2022-06-08 DIAGNOSIS — M25511 Pain in right shoulder: Secondary | ICD-10-CM | POA: Insufficient documentation

## 2022-06-08 DIAGNOSIS — Z043 Encounter for examination and observation following other accident: Secondary | ICD-10-CM | POA: Diagnosis not present

## 2022-06-08 DIAGNOSIS — M25552 Pain in left hip: Secondary | ICD-10-CM | POA: Diagnosis not present

## 2022-06-08 NOTE — Progress Notes (Signed)
BP 106/62   Pulse (!) 58   Temp 98.5 F (36.9 C) (Oral)   Ht 6\' 2"  (1.88 m)   Wt 185 lb 8 oz (84.1 kg)   SpO2 (!) 88%   BMI 23.82 kg/m    Subjective:    Patient ID: Larry Buckley, male    DOB: 1941/01/17, 81 y.o.   MRN: [id number]  HPI: [name] is a 81 y.o. male  Chief Complaint  Patient presents with   Fall    Patient is here for Fall. Patient caregiver son says patient was in the shower and he fell. Patient mother says patient was found in the shower. Patient has some bruising to his left hip. Patient says he is having some other pain, but not really bad pain. Patient caregiver said they have noticed some swelling on his left side.   Shoulder Pain    Patient says he is having some pain right now in his right shoulder.    Had a fall Sunday night at his assisted living home.  Not witnessed. When they found him he was lying on left side.  Does not use a walker, independent -- dementia will not use walker.  HIP PAIN Has had bruise and swelling to left hip.  Still walking and mobile. He denies pain today, reports some weakness. Duration: days Involved hip: left  Mechanism of injury: trauma Status: stable Treatments attempted:  none   Relief with NSAIDs?: No NSAIDs Taken Weakness with weight bearing:  a little  per patient, but no pain Weakness with walking: no Paresthesias / decreased sensation: no Swelling: yes Redness:yes Fevers: no   SHOULDER PAIN Having some right shoulder pain per patient report. Duration: days Involved shoulder: right Mechanism of injury: trauma Location: anterior Onset:gradual Severity: moderate  Quality:  sharp, dull, and throbbing Frequency: intermittent Radiation: no Aggravating factors: lifting and movement  Alleviating factors: nothing -- no meds taken  Status: fluctuating Treatments attempted:  none  Relief with NSAIDs?:  No NSAIDs Taken Weakness: no Numbness: no Decreased grip strength: no Redness: no Swelling:  no Bruising: no Fevers: no   Relevant past medical, surgical, family and social history reviewed and updated as indicated. Interim medical history since our last visit reviewed. Allergies and medications reviewed and updated.  Review of Systems  Constitutional:  Negative for activity change, appetite change, chills, diaphoresis, fatigue, fever and unexpected weight change.  Respiratory:  Negative for cough, chest tightness, shortness of breath and wheezing.   Cardiovascular:  Negative for chest pain, palpitations and leg swelling.  Gastrointestinal: Negative.   Musculoskeletal:  Positive for arthralgias.  Neurological: Negative.   Psychiatric/Behavioral:  Positive for confusion (dementia). Negative for self-injury, sleep disturbance and suicidal ideas. The patient is nervous/anxious.     Per HPI unless specifically indicated above     Objective:    BP 106/62   Pulse (!) 58   Temp 98.5 F (36.9 C) (Oral)   Ht 6\' 2"  (1.88 m)   Wt 185 lb 8 oz (84.1 kg)   SpO2 (!) 88%   BMI 23.82 kg/m   Wt Readings from Last 3 Encounters:  06/08/22 185 lb 8 oz (84.1 kg)  04/25/22 197 lb 6.4 oz (89.5 kg)  02/28/22 194 lb 1.6 oz (88 kg)    Physical Exam Vitals and nursing note reviewed.  Constitutional:      General: He is awake. He is not in acute distress.    Appearance: He is well-developed and well-groomed. He is  not ill-appearing or toxic-appearing.  HENT:     Head: Normocephalic and atraumatic.     Right Ear: Hearing, ear canal and external ear normal. No drainage.     Left Ear: Hearing, ear canal and external ear normal. No drainage.  Eyes:     General: Lids are normal.        Right eye: No discharge.        Left eye: No discharge.     Conjunctiva/sclera: Conjunctivae normal.     Pupils: Pupils are equal, round, and reactive to light.  Neck:     Thyroid: No thyromegaly.     Vascular: No carotid bruit.  Cardiovascular:     Rate and Rhythm: Normal rate and regular rhythm.      Heart sounds: Normal heart sounds, S1 normal and S2 normal. No murmur heard.    No gallop.  Pulmonary:     Effort: Pulmonary effort is normal. No accessory muscle usage or respiratory distress.     Breath sounds: Normal breath sounds.  Abdominal:     General: Bowel sounds are normal.     Palpations: Abdomen is soft.  Musculoskeletal:        General: Normal range of motion.     Right shoulder: No swelling, laceration, tenderness, bony tenderness or crepitus. Normal range of motion. Normal pulse.     Left shoulder: Normal.     Cervical back: Normal range of motion and neck supple.     Right hip: Normal.     Left hip: Tenderness and crepitus present. No bony tenderness. Normal range of motion. Normal strength.     Right lower leg: No edema.     Left lower leg: No edema.  Lymphadenopathy:     Cervical: No cervical adenopathy.  Skin:    General: Skin is warm and dry.     Capillary Refill: Capillary refill takes less than 2 seconds.     Findings: Ecchymosis (to posterior left gluteus) and rash present.  Neurological:     Mental Status: He is alert.     Coordination: Coordination is intact.     Gait: Gait is intact.     Deep Tendon Reflexes: Reflexes are normal and symmetric.     Reflex Scores:      Brachioradialis reflexes are 2+ on the right side and 2+ on the left side.      Patellar reflexes are 2+ on the right side and 2+ on the left side.    Comments: Pleasantly confused, no able to report month, year, day.  Psychiatric:        Attention and Perception: Attention normal.        Mood and Affect: Mood normal.        Speech: Speech normal.        Behavior: Behavior normal. Behavior is cooperative.        Cognition and Memory: Memory is impaired.     Comments: Unable to report month, day, year, or location.      Results for orders placed or performed during the hospital encounter of 05/25/22  Resp Panel by RT-PCR (Flu A&B, Covid) Anterior Nasal Swab   Specimen: Anterior Nasal  Swab  Result Value Ref Range   SARS Coronavirus 2 by RT PCR NEGATIVE NEGATIVE   Influenza A by PCR NEGATIVE NEGATIVE   Influenza B by PCR NEGATIVE NEGATIVE  Comprehensive metabolic panel  Result Value Ref Range   Sodium 139 135 - 145 mmol/L   Potassium 3.9 3.5 -  5.1 mmol/L   Chloride 105 98 - 111 mmol/L   CO2 29 22 - 32 mmol/L   Glucose, Bld 96 70 - 99 mg/dL   BUN 19 8 - 23 mg/dL   Creatinine, Ser 6.96 0.61 - 1.24 mg/dL   Calcium 9.0 8.9 - 29.5 mg/dL   Total Protein 7.4 6.5 - 8.1 g/dL   Albumin 3.4 (L) 3.5 - 5.0 g/dL   AST 18 15 - 41 U/L   ALT 14 0 - 44 U/L   Alkaline Phosphatase 49 38 - 126 U/L   Total Bilirubin 0.6 0.3 - 1.2 mg/dL   GFR, Estimated >28 >41 mL/min   Anion gap 5 5 - 15  Ethanol  Result Value Ref Range   Alcohol, Ethyl (B) <10 <10 mg/dL  Salicylate level  Result Value Ref Range   Salicylate Lvl <7.0 (L) 7.0 - 30.0 mg/dL  Acetaminophen level  Result Value Ref Range   Acetaminophen (Tylenol), Serum <10 (L) 10 - 30 ug/mL  cbc  Result Value Ref Range   WBC 4.3 4.0 - 10.5 K/uL   RBC 4.00 (L) 4.22 - 5.81 MIL/uL   Hemoglobin 12.3 (L) 13.0 - 17.0 g/dL   HCT 32.4 (L) 40.1 - 02.7 %   MCV 94.0 80.0 - 100.0 fL   MCH 30.8 26.0 - 34.0 pg   MCHC 32.7 30.0 - 36.0 g/dL   RDW 25.3 66.4 - 40.3 %   Platelets 238 150 - 400 K/uL   nRBC 0.0 0.0 - 0.2 %      Assessment & Plan:   Problem List Items Addressed This Visit       Other   Acute hip pain, left - Primary    Acute after recent fall, mild edema and bruising present.  Will obtain imaging to r/o fracture, low suspicion for this as is very mobile at this time without pain.  Recommend Tylenol 1000 MG TID PRN if pain presents or Voltaren gel.  If fracture present then will send to ortho.  Continue to work with neurology on dementia and behaviors.  Referral to physical therapy to work on strengthening in home.      Relevant Orders   DG Hip Unilat W OR W/O Pelvis 2-3 Views Left   Ambulatory referral to Home Health    Acute pain of right shoulder    Acute after recent fall, no concerning findings on exam.  Will obtain imaging to r/o fracture, low suspicion for this as is very mobile at this time without pain.  Recommend Tylenol 1000 MG TID PRN if pain presents or Voltaren gel.  If fracture present then will send to ortho.  Continue to work with neurology on dementia and behaviors.  Referral to physical therapy to work on strengthening in home.      Relevant Orders   DG Shoulder Right   Ambulatory referral to Home Health     Follow up plan: Return if symptoms worsen or fail to improve, for as scheduled.

## 2022-06-08 NOTE — Assessment & Plan Note (Signed)
Acute after recent fall, no concerning findings on exam.  Will obtain imaging to r/o fracture, low suspicion for this as is very mobile at this time without pain.  Recommend Tylenol 1000 MG TID PRN if pain presents or Voltaren gel.  If fracture present then will send to ortho.  Continue to work with neurology on dementia and behaviors.  Referral to physical therapy to work on strengthening in home.

## 2022-06-08 NOTE — Patient Instructions (Signed)
May take Tylenol 1000 MG three times a day as needed.  Hip Pain The hip is the joint between the upper legs and the lower pelvis. The bones, cartilage, tendons, and muscles of your hip joint support your body and allow you to move around. Hip pain can range from a minor ache to severe pain in one or both of your hips. The pain may be felt on the inside of the hip joint near the groin, or on the outside near the buttocks and upper thigh. You may also have swelling or stiffness in your hip area. Follow these instructions at home: Managing pain, stiffness, and swelling     If directed, put ice on the painful area. To do this: Put ice in a plastic bag. Place a towel between your skin and the bag. Leave the ice on for 20 minutes, 2-3 times a day. If directed, apply heat to the affected area as often as told by your health care provider. Use the heat source that your health care provider recommends, such as a moist heat pack or a heating pad. Place a towel between your skin and the heat source. Leave the heat on for 20-30 minutes. Remove the heat if your skin turns bright red. This is especially important if you are unable to feel pain, heat, or cold. You may have a greater risk of getting burned. Activity Do exercises as told by your health care provider. Avoid activities that cause pain. General instructions  Take over-the-counter and prescription medicines only as told by your health care provider. Keep a journal of your symptoms. Write down: How often you have hip pain. The location of your pain. What the pain feels like. What makes the pain worse. Sleep with a pillow between your legs on your most comfortable side. Keep all follow-up visits as told by your health care provider. This is important. Contact a health care provider if: You cannot put weight on your leg. Your pain or swelling continues or gets worse after one week. It gets harder to walk. You have a fever. Get help right  away if: You fall. You have a sudden increase in pain and swelling in your hip. Your hip is red or swollen or very tender to touch. Summary Hip pain can range from a minor ache to severe pain in one or both of your hips. The pain may be felt on the inside of the hip joint near the groin, or on the outside near the buttocks and upper thigh. Avoid activities that cause pain. Write down how often you have hip pain, the location of the pain, what makes it worse, and what it feels like. This information is not intended to replace advice given to you by your health care provider. Make sure you discuss any questions you have with your health care provider. Document Revised: 02/17/2019 Document Reviewed: 02/17/2019 Elsevier Patient Education  2023 [employer].

## 2022-06-08 NOTE — Assessment & Plan Note (Addendum)
Acute after recent fall, mild edema and bruising present.  Will obtain imaging to r/o fracture, low suspicion for this as is very mobile at this time without pain.  Recommend Tylenol 1000 MG TID PRN if pain presents or Voltaren gel.  If fracture present then will send to ortho.  Continue to work with neurology on dementia and behaviors.  Referral to physical therapy to work on strengthening in home.

## 2022-06-09 NOTE — Progress Notes (Signed)
Please let Larry Buckley's caregiver know I am waiting on hip imaging, but shoulder imaging is showing old fracture present, if ongoing pain we could consider further imaging and referral to ortho as needed.  Any questions?  Have a great day!!

## 2022-06-13 ENCOUNTER — Inpatient Hospital Stay: Admission: RE | Admit: 2022-06-13 | Payer: Medicare HMO | Source: Ambulatory Visit

## 2022-06-16 ENCOUNTER — Telehealth: Payer: Self-pay

## 2022-06-16 NOTE — Telephone Encounter (Signed)
FL2 form received through fax. Medication section completed. Placed in providers folder for completion.

## 2022-06-16 NOTE — Telephone Encounter (Signed)
Copied from CRM [id number]. Topic: General - Inquiry >> Jun 16, 2022 10:50 AM De Blanch wrote: Reason for CRM: Pt Guardian Larry Buckley w/[facility] stated needs FL2 form filled out. Seeking placement for him of high-level care.  Make sure FL2 form is checked off for skilled nursing.   Pt current caregiver will be calling in as well just incase pt needs to be seen.   Call back - [phone] Fax- [phone]

## 2022-06-20 NOTE — Telephone Encounter (Signed)
Form faxed back to [facility] as requested.   Called and LVM letting her know that form was faxed back. Asked for her to call back with any questions.

## 2022-06-27 ENCOUNTER — Inpatient Hospital Stay
Admission: EM | Admit: 2022-06-27 | Discharge: 2022-10-16 | DRG: 884 | Disposition: E | Payer: Medicare PPO | Attending: Student | Admitting: Student

## 2022-06-27 DIAGNOSIS — G4700 Insomnia, unspecified: Secondary | ICD-10-CM | POA: Diagnosis present

## 2022-06-27 DIAGNOSIS — R64 Cachexia: Secondary | ICD-10-CM | POA: Diagnosis present

## 2022-06-27 DIAGNOSIS — F0390 Unspecified dementia without behavioral disturbance: Secondary | ICD-10-CM | POA: Diagnosis not present

## 2022-06-27 DIAGNOSIS — F03C11 Unspecified dementia, severe, with agitation: Secondary | ICD-10-CM | POA: Diagnosis present

## 2022-06-27 DIAGNOSIS — F05 Delirium due to known physiological condition: Secondary | ICD-10-CM | POA: Diagnosis present

## 2022-06-27 DIAGNOSIS — Z888 Allergy status to other drugs, medicaments and biological substances status: Secondary | ICD-10-CM

## 2022-06-27 DIAGNOSIS — E785 Hyperlipidemia, unspecified: Secondary | ICD-10-CM | POA: Diagnosis present

## 2022-06-27 DIAGNOSIS — F03918 Unspecified dementia, unspecified severity, with other behavioral disturbance: Secondary | ICD-10-CM | POA: Diagnosis not present

## 2022-06-27 DIAGNOSIS — R600 Localized edema: Secondary | ICD-10-CM | POA: Diagnosis present

## 2022-06-27 DIAGNOSIS — F03C18 Unspecified dementia, severe, with other behavioral disturbance: Secondary | ICD-10-CM | POA: Diagnosis not present

## 2022-06-27 DIAGNOSIS — Z87891 Personal history of nicotine dependence: Secondary | ICD-10-CM

## 2022-06-27 DIAGNOSIS — Z751 Person awaiting admission to adequate facility elsewhere: Secondary | ICD-10-CM

## 2022-06-27 DIAGNOSIS — R4689 Other symptoms and signs involving appearance and behavior: Secondary | ICD-10-CM | POA: Diagnosis present

## 2022-06-27 DIAGNOSIS — I959 Hypotension, unspecified: Secondary | ICD-10-CM | POA: Diagnosis not present

## 2022-06-27 DIAGNOSIS — Z79899 Other long term (current) drug therapy: Secondary | ICD-10-CM

## 2022-06-27 DIAGNOSIS — Y738 Miscellaneous gastroenterology and urology devices associated with adverse incidents, not elsewhere classified: Secondary | ICD-10-CM | POA: Diagnosis not present

## 2022-06-27 DIAGNOSIS — F03C4 Unspecified dementia, severe, with anxiety: Secondary | ICD-10-CM | POA: Diagnosis present

## 2022-06-27 DIAGNOSIS — R578 Other shock: Secondary | ICD-10-CM | POA: Diagnosis not present

## 2022-06-27 DIAGNOSIS — I10 Essential (primary) hypertension: Secondary | ICD-10-CM | POA: Diagnosis present

## 2022-06-27 DIAGNOSIS — Z7982 Long term (current) use of aspirin: Secondary | ICD-10-CM

## 2022-06-27 DIAGNOSIS — G4731 Primary central sleep apnea: Secondary | ICD-10-CM | POA: Diagnosis present

## 2022-06-27 DIAGNOSIS — N390 Urinary tract infection, site not specified: Secondary | ICD-10-CM | POA: Diagnosis present

## 2022-06-27 DIAGNOSIS — Z8249 Family history of ischemic heart disease and other diseases of the circulatory system: Secondary | ICD-10-CM | POA: Diagnosis not present

## 2022-06-27 DIAGNOSIS — R338 Other retention of urine: Secondary | ICD-10-CM

## 2022-06-27 DIAGNOSIS — I469 Cardiac arrest, cause unspecified: Secondary | ICD-10-CM | POA: Insufficient documentation

## 2022-06-27 DIAGNOSIS — R630 Anorexia: Secondary | ICD-10-CM | POA: Diagnosis not present

## 2022-06-27 DIAGNOSIS — N3000 Acute cystitis without hematuria: Secondary | ICD-10-CM | POA: Diagnosis not present

## 2022-06-27 DIAGNOSIS — J9811 Atelectasis: Secondary | ICD-10-CM | POA: Diagnosis present

## 2022-06-27 DIAGNOSIS — R310 Gross hematuria: Secondary | ICD-10-CM | POA: Diagnosis not present

## 2022-06-27 DIAGNOSIS — R5381 Other malaise: Secondary | ICD-10-CM | POA: Diagnosis present

## 2022-06-27 DIAGNOSIS — R531 Weakness: Secondary | ICD-10-CM

## 2022-06-27 DIAGNOSIS — M1612 Unilateral primary osteoarthritis, left hip: Secondary | ICD-10-CM | POA: Diagnosis present

## 2022-06-27 DIAGNOSIS — Z7189 Other specified counseling: Secondary | ICD-10-CM | POA: Diagnosis not present

## 2022-06-27 DIAGNOSIS — R627 Adult failure to thrive: Secondary | ICD-10-CM | POA: Diagnosis present

## 2022-06-27 DIAGNOSIS — E43 Unspecified severe protein-calorie malnutrition: Secondary | ICD-10-CM | POA: Diagnosis present

## 2022-06-27 DIAGNOSIS — Z20822 Contact with and (suspected) exposure to covid-19: Secondary | ICD-10-CM | POA: Diagnosis present

## 2022-06-27 DIAGNOSIS — T83021A Displacement of indwelling urethral catheter, initial encounter: Secondary | ICD-10-CM | POA: Diagnosis not present

## 2022-06-27 DIAGNOSIS — R319 Hematuria, unspecified: Secondary | ICD-10-CM | POA: Diagnosis not present

## 2022-06-27 DIAGNOSIS — Z6823 Body mass index (BMI) 23.0-23.9, adult: Secondary | ICD-10-CM | POA: Diagnosis not present

## 2022-06-27 DIAGNOSIS — R4182 Altered mental status, unspecified: Secondary | ICD-10-CM | POA: Diagnosis not present

## 2022-06-27 DIAGNOSIS — M7989 Other specified soft tissue disorders: Secondary | ICD-10-CM | POA: Diagnosis not present

## 2022-06-27 DIAGNOSIS — K5900 Constipation, unspecified: Secondary | ICD-10-CM | POA: Diagnosis present

## 2022-06-27 DIAGNOSIS — E440 Moderate protein-calorie malnutrition: Secondary | ICD-10-CM | POA: Diagnosis not present

## 2022-06-27 DIAGNOSIS — Z8679 Personal history of other diseases of the circulatory system: Secondary | ICD-10-CM

## 2022-06-27 DIAGNOSIS — Z515 Encounter for palliative care: Secondary | ICD-10-CM | POA: Diagnosis not present

## 2022-06-27 DIAGNOSIS — M109 Gout, unspecified: Secondary | ICD-10-CM | POA: Diagnosis present

## 2022-06-27 DIAGNOSIS — T17320A Food in larynx causing asphyxiation, initial encounter: Secondary | ICD-10-CM | POA: Diagnosis not present

## 2022-06-27 LAB — COMPREHENSIVE METABOLIC PANEL
ALT: 13 U/L (ref 0–44)
AST: 19 U/L (ref 15–41)
Albumin: 3 g/dL — ABNORMAL LOW (ref 3.5–5.0)
Alkaline Phosphatase: 45 U/L (ref 38–126)
Anion gap: 8 (ref 5–15)
BUN: 21 mg/dL (ref 8–23)
CO2: 24 mmol/L (ref 22–32)
Calcium: 8.6 mg/dL — ABNORMAL LOW (ref 8.9–10.3)
Chloride: 107 mmol/L (ref 98–111)
Creatinine, Ser: 0.9 mg/dL (ref 0.61–1.24)
GFR, Estimated: >60 mL/min (ref >60)
Glucose, Bld: 86 mg/dL (ref 70–99)
Potassium: 4.2 mmol/L (ref 3.5–5.1)
Sodium: 139 mmol/L (ref 135–145)
Total Bilirubin: 0.5 mg/dL (ref 0.3–1.2)
Total Protein: 6.7 g/dL (ref 6.5–8.1)

## 2022-06-27 LAB — CBC
HCT: 32.8 % — ABNORMAL LOW (ref 39.0–52.0)
Hemoglobin: 10.6 g/dL — ABNORMAL LOW (ref 13.0–17.0)
MCH: 31.2 pg (ref 26.0–34.0)
MCHC: 32.3 g/dL (ref 30.0–36.0)
MCV: 96.5 fL (ref 80.0–100.0)
Platelets: 244 10*3/uL (ref 150–400)
RBC: 3.4 MIL/uL — ABNORMAL LOW (ref 4.22–5.81)
RDW: 15.1 % (ref 11.5–15.5)
WBC: 5.1 10*3/uL (ref 4.0–10.5)
nRBC: 0 % (ref 0.0–0.2)

## 2022-06-27 LAB — VALPROIC ACID LEVEL: Valproic Acid Lvl: 45 ug/mL — ABNORMAL LOW (ref 50.0–100.0)

## 2022-06-27 LAB — RESP PANEL BY RT-PCR (FLU A&B, COVID) ARPGX2
Influenza A by PCR: NEGATIVE
Influenza B by PCR: NEGATIVE
SARS Coronavirus 2 by RT PCR: NEGATIVE

## 2022-06-27 LAB — ETHANOL: Alcohol, Ethyl (B): <10 mg/dL (ref <10)

## 2022-06-27 MED ORDER — MELATONIN 5 MG PO TABS
10.0000 mg | ORAL_TABLET | Freq: Every day | ORAL | Status: DC
  Administered 2022-06-28 – 2022-10-01 (×95): 10 mg via ORAL
  Filled 2022-06-27 (×98): qty 2

## 2022-06-27 MED ORDER — ATORVASTATIN CALCIUM 20 MG PO TABS
20.0000 mg | ORAL_TABLET | Freq: Every day | ORAL | Status: DC
Start: 2022-06-27 — End: 2022-10-03
  Administered 2022-06-28 – 2022-10-01 (×95): 20 mg via ORAL
  Filled 2022-06-27 (×97): qty 1

## 2022-06-27 MED ORDER — ASPIRIN 81 MG PO TBEC
81.0000 mg | DELAYED_RELEASE_TABLET | Freq: Every day | ORAL | Status: DC
  Administered 2022-06-28 – 2022-07-15 (×17): 81 mg via ORAL
  Filled 2022-06-27 (×18): qty 1

## 2022-06-27 MED ORDER — MIRTAZAPINE 15 MG PO TABS
15.0000 mg | ORAL_TABLET | Freq: Every day | ORAL | Status: DC
  Administered 2022-06-28 – 2022-10-01 (×95): 15 mg via ORAL
  Filled 2022-06-27 (×98): qty 1

## 2022-06-27 MED ORDER — HALOPERIDOL LACTATE 5 MG/ML IJ SOLN
5.0000 mg | Freq: Once | INTRAMUSCULAR | Status: AC
  Administered 2022-06-27: 5 mg via INTRAMUSCULAR
  Filled 2022-06-27: qty 1

## 2022-06-27 MED ORDER — AMLODIPINE BESYLATE 5 MG PO TABS
5.0000 mg | ORAL_TABLET | Freq: Every day | ORAL | Status: DC
Start: 2022-06-28 — End: 2022-09-23
  Administered 2022-06-28 – 2022-09-23 (×83): 5 mg via ORAL
  Filled 2022-06-27 (×88): qty 1

## 2022-06-27 MED ORDER — RISPERIDONE 0.5 MG PO TABS
0.7500 mg | ORAL_TABLET | Freq: Two times a day (BID) | ORAL | Status: DC
  Administered 2022-06-28 – 2022-07-06 (×18): 0.75 mg via ORAL
  Filled 2022-06-27 (×21): qty 1

## 2022-06-27 MED ORDER — RISPERIDONE 1 MG PO TABS
0.5000 mg | ORAL_TABLET | Freq: Once | ORAL | Status: AC
  Administered 2022-06-27: 0.5 mg via ORAL
  Filled 2022-06-27: qty 1

## 2022-06-27 MED ORDER — DIVALPROEX SODIUM 500 MG PO DR TAB
500.0000 mg | DELAYED_RELEASE_TABLET | Freq: Two times a day (BID) | ORAL | Status: DC
  Administered 2022-06-28 – 2022-08-25 (×114): 500 mg via ORAL
  Filled 2022-06-27 (×117): qty 1

## 2022-06-27 NOTE — ED Notes (Signed)
Pt attempting to get out of bed. This Insurance underwriter assisted pt back into bed. Bed alarm restarted at this time. No other needs voiced. Fall risk signs placed on door and yellow armband placed on pt at this time.

## 2022-06-27 NOTE — ED Notes (Signed)
Pt attempting to get out of bed, pt bed alarm sounding. Pt assisted back to bed. Pt complied with no issue

## 2022-06-27 NOTE — ED Notes (Signed)
Pt standing in hallway, this writer and Mayra EDT assisted pt back in bed. Bed alarm placed at this time. Amil Amen RN notified.

## 2022-06-27 NOTE — ED Triage Notes (Signed)
Pt presented to ED via EMS IVC from [facility] located on [address]. EMS reports that teacher stated that he was being combative and swinging at staff and residents per teacher report. Teacher gave pt's morning dose of Risperdol.   EMS states pt has been calm and cooperative for them, VSS. Pt is alert, calm, and cooperative.

## 2022-06-27 NOTE — ED Provider Notes (Signed)
   [facility] Provider Note    Event Date/Time   First MD Initiated Contact with Patient 06/26/2022 1216     (approximate)  History   Chief Complaint: IVC  HPI  BRAYLAN FAUL is a 81 y.o. male with a past medical history of dementia, hypertension, hyperlipidemia presents to the emergency department under IVC for agitation.  According to the IVC patient was at his day facility when he became agitated and was acting threatening in a violent manner.  Patient was given Risperdal it is not clear for the patient received his risperidone earlier today as it is prescribed.  Patient received his risperidone after the agitation episode and is now calm cooperative, no distress.  Overall patient appears well he denies any medical complaints.  Physical Exam   Triage Vital Signs: ED Triage Vitals [06/28/2022 1221]  Enc Vitals Group     BP (!) 129/56     Pulse Rate 98     Resp 16     Temp      Temp Source Oral     SpO2 97 %     Weight      Height      Head Circumference      Peak Flow      Pain Score      Pain Loc      Pain Edu?      Excl. in GC?     Most recent vital signs: Vitals:   07/06/2022 1221  BP: (!) 129/56  Pulse: 98  Resp: 16  SpO2: 97%    General: Awake, no distress.  Calm and cooperative CV:  Good peripheral perfusion.  Regular rate and rhythm  Resp:  Normal effort.  Equal breath sounds bilaterally.  Abd:  No distention.  Soft, nontender.  No rebound or guarding.    ED Results / Procedures / Treatments    MEDICATIONS ORDERED IN ED: Medications - No data to display   IMPRESSION / MDM / ASSESSMENT AND PLAN / ED COURSE  I reviewed the triage vital signs and the nursing notes.  Patient's presentation is most consistent with acute presentation with potential threat to life or bodily function.  Patient presents emergency department under IVC for acute agitation at his day facility.  Patient has known dementia, possibly did not receive his  morning risperidone until after this episode is not clear.  Now the patient is calm and cooperative, he has no complaints.  We will check basic labs and have psychiatry evaluate.  As the patient is calm and cooperative with a known history of dementia anticipate the patient could be discharged back to his care facility if they are willing to take him.  Patient's work-up is reassuring, CBC shows no concerning findings, chemistry is reassuring, ethanol is negative.  Currently awaiting psychiatric disposition.  FINAL CLINICAL IMPRESSION(S) / ED DIAGNOSES   Dementia with behavioral disturbance   Note:  This document was prepared using Dragon voice recognition software and may include unintentional dictation errors.   Minna Antis, MD 06/19/2022 1500

## 2022-06-27 NOTE — ED Notes (Signed)
Pt attempting to get out of bed. Pt redirected back to bed.

## 2022-06-27 NOTE — ED Notes (Signed)
Pt still resting after IM med administration- will continue to let patient rest at this time due to prior patient behaviors. Will hold PO medications for now

## 2022-06-27 NOTE — Consult Note (Signed)
[facility] Face-to-Face Psychiatry Consult   Reason for Consult:  agitation at family care home Referring Physician:  Paduchowski  Patient Identification: Larry Buckley MRN:  [id number] Principal Diagnosis: Dementia with behavioral disturbance [facility]) Diagnosis:  Principal Problem:   Dementia with behavioral disturbance (HCC)   Total Time spent with patient: 45 minutes  Subjective:   Larry Buckley is a 81 y.o. male patient admitted with : Brought in via EMS under IVC for alleged combative behavior at family care home  HPI:  Patient has a history of dementia. Writer spoke with Legrand Rams and [facility] owner, Gilman Buttner [phone]), who states that patient has been in her care since 04/15/22 and he is taking his medication as prescribed, and his outpatient provider has made some recent changes to his medications to help control his behaviors. She states that patient wakes in the middle of the night and gets into his roommates belongings. He has also urinated outside of the bathroom. Today she states he started to swing at staff, but he calmed down when given Risperidone.   On evaluation, patient is laying in stretcher with head elevated. He is calm, smiling. He has scant intelligible conversation. He is alert and disoriented to place, time, situation. When asked where he lives he sates "here; you have good food. "  Patient is unable to verbalize why he came to ED and does not respond to general questions.   Family Care home owner states that she has contacted the guardian supervisor and informed her that patient will not be able to return to her care.      Past Psychiatric History: none. Has dementia  Risk to Self:   Risk to Others:   Prior Inpatient Therapy:   Prior Outpatient Therapy:    Past Medical History:  Past Medical History:  Diagnosis Date   AAA (abdominal aortic aneurysm) (HCC)    Dementia (HCC) 05/21/2020   Gout, arthropathy    Hyperlipidemia    Hypertension      Past Surgical History:  Procedure Laterality Date   PERIPHERAL VASCULAR CATHETERIZATION N/A 02/08/2016   Procedure: Endovascular Repair/Stent Graft;  Surgeon: Annice Needy, MD;  Location: [facility] INVASIVE CV LAB;  Service: Cardiovascular;  Laterality: N/A;   Family History:  Family History  Problem Relation Age of Onset   Cancer Mother    Diabetes Mother    Heart disease Father    Hypertension Father    Heart disease Brother    Family Psychiatric  History:  Social History:  Social History   Substance and Sexual Activity  Alcohol Use Yes     Social History   Substance and Sexual Activity  Drug Use No    Social History   Socioeconomic History   Marital status: Widowed    Spouse name: Not on file   Number of children: Not on file   Years of education: Not on file   Highest education level: Not on file  Occupational History   Not on file  Tobacco Use   Smoking status: Former   Smokeless tobacco: Never  Substance and Sexual Activity   Alcohol use: Yes   Drug use: No   Sexual activity: Not Currently  Other Topics Concern   Not on file  Social History Narrative   Not on file   Social Determinants of Health   Financial Resource Strain: Low Risk  (05/09/2022)   Overall Financial Resource Strain (CARDIA)    Difficulty of Paying Living Expenses: Not hard  at all  Food Insecurity: No Food Insecurity (05/09/2022)   Hunger Vital Sign    Worried About Running Out of Food in the Last Year: Never true    Ran Out of Food in the Last Year: Never true  Transportation Needs: No Transportation Needs (05/09/2022)   PRAPARE - Administrator, Civil ServiceTransportation    Lack of Transportation (Medical): No    Lack of Transportation (Non-Medical): No  Physical Activity: Inactive (05/09/2022)   Exercise Vital Sign    Days of Exercise per Week: 0 days    Minutes of Exercise per Session: 0 min  Stress: No Stress Concern Present (05/09/2022)   [employer] - Occupational Stress  Questionnaire    Feeling of Stress : Not at all  Social Connections: Socially Isolated (05/09/2022)   Social Connection and Isolation Panel [NHANES]    Frequency of Communication with Friends and Family: Never    Frequency of Social Gatherings with Friends and Family: Never    Attends Religious Services: Never    Database administratorActive Member of Clubs or Organizations: No    Attends BankerClub or Organization Meetings: Never    Marital Status: Widowed   Additional Social History:    Allergies:   Allergies  Allergen Reactions   Lotensin [Benazepril Hcl] Swelling    Labs:  Results for orders placed or performed during the hospital encounter of 2022-08-22 (from the past 48 hour(s))  CBC     Status: Abnormal   Collection Time: 2022-08-22 12:27 PM  Result Value Ref Range   WBC 5.1 4.0 - 10.5 K/uL   RBC 3.40 (L) 4.22 - 5.81 MIL/uL   Hemoglobin 10.6 (L) 13.0 - 17.0 g/dL   HCT 16.132.8 (L) 09.639.0 - 04.552.0 %   MCV 96.5 80.0 - 100.0 fL   MCH 31.2 26.0 - 34.0 pg   MCHC 32.3 30.0 - 36.0 g/dL   RDW 40.915.1 81.111.5 - 91.415.5 %   Platelets 244 150 - 400 K/uL   nRBC 0.0 0.0 - 0.2 %    Comment: Performed at [facility], [address] [place], [place] [address]  Comprehensive metabolic panel     Status: Abnormal   Collection Time: 2022-08-22 12:27 PM  Result Value Ref Range   Sodium 139 135 - 145 mmol/L   Potassium 4.2 3.5 - 5.1 mmol/L   Chloride 107 98 - 111 mmol/L   CO2 24 22 - 32 mmol/L   Glucose, Bld 86 70 - 99 mg/dL    Comment: Glucose reference range applies only to samples taken after fasting for at least 8 hours.   BUN 21 8 - 23 mg/dL   Creatinine, Ser 6.210.90 0.61 - 1.24 mg/dL   Calcium 8.6 (L) 8.9 - 10.3 mg/dL   Total Protein 6.7 6.5 - 8.1 g/dL   Albumin 3.0 (L) 3.5 - 5.0 g/dL   AST 19 15 - 41 U/L   ALT 13 0 - 44 U/L   Alkaline Phosphatase 45 38 - 126 U/L   Total Bilirubin 0.5 0.3 - 1.2 mg/dL   GFR, Estimated >30>60 >86>60 mL/min    Comment: (NOTE) Calculated using the CKD-EPI Creatinine Equation (2021)     Anion gap 8 5 - 15    Comment: Performed at [facility], [address] [place], [place] [address]  Ethanol     Status: None   Collection Time: 2022-08-22 12:27 PM  Result Value Ref Range   Alcohol, Ethyl (B) <10 <10 mg/dL    Comment: (NOTE) Lowest detectable  limit for serum alcohol is 10 mg/dL.  For medical purposes only. Performed at [facility], [address] [place], [place] [address]   Valproic acid level     Status: Abnormal   Collection Time: 06/28/2022  2:02 PM  Result Value Ref Range   Valproic Acid Lvl 45 (L) 50.0 - 100.0 ug/mL    Comment: Performed at [facility], [address] [place], [place] [address]  Resp Panel by RT-PCR (Flu A&B, Covid) Anterior Nasal Swab     Status: None   Collection Time: 07/12/2022  2:17 PM   Specimen: Anterior Nasal Swab  Result Value Ref Range   SARS Coronavirus 2 by RT PCR NEGATIVE NEGATIVE    Comment: (NOTE) SARS-CoV-2 target nucleic acids are NOT DETECTED.  The SARS-CoV-2 RNA is generally detectable in upper respiratory specimens during the acute phase of infection. The lowest concentration of SARS-CoV-2 viral copies this assay can detect is 138 copies/mL. A negative result does not preclude SARS-Cov-2 infection and should not be used as the sole basis for treatment or other patient management decisions. A negative result may occur with  improper specimen collection/handling, submission of specimen other than nasopharyngeal swab, presence of viral mutation(s) within the areas targeted by this assay, and inadequate number of viral copies(<138 copies/mL). A negative result must be combined with clinical observations, patient history, and epidemiological information. The expected result is Negative.  Fact Sheet for Patients:  BloggerCourse.com  Fact Sheet for Healthcare Providers:  SeriousBroker.it  This test is no t yet approved or cleared by the  [place] FDA and  has been authorized for detection and/or diagnosis of SARS-CoV-2 by FDA under an Emergency Use Authorization (EUA). This EUA will remain  in effect (meaning this test can be used) for the duration of the COVID-19 declaration under Section 564(b)(1) of the Act, 21 U.S.C.section 360bbb-3(b)(1), unless the authorization is terminated  or revoked sooner.       Influenza A by PCR NEGATIVE NEGATIVE   Influenza B by PCR NEGATIVE NEGATIVE    Comment: (NOTE) The Xpert Xpress SARS-CoV-2/FLU/RSV plus assay is intended as an aid in the diagnosis of influenza from Nasopharyngeal swab specimens and should not be used as a sole basis for treatment. Nasal washings and aspirates are unacceptable for Xpert Xpress SARS-CoV-2/FLU/RSV testing.  Fact Sheet for Patients: BloggerCourse.com  Fact Sheet for Healthcare Providers: SeriousBroker.it  This test is not yet approved or cleared by the [place] FDA and has been authorized for detection and/or diagnosis of SARS-CoV-2 by FDA under an Emergency Use Authorization (EUA). This EUA will remain in effect (meaning this test can be used) for the duration of the COVID-19 declaration under Section 564(b)(1) of the Act, 21 U.S.C. section 360bbb-3(b)(1), unless the authorization is terminated or revoked.  Performed at [facility], [address] [place], [place] [address]     No current facility-administered medications for this encounter.   Current Outpatient Medications  Medication Sig Dispense Refill   amLODipine (NORVASC) 5 MG tablet Take 1 tablet (5 mg total) by mouth daily. 90 tablet 4   aspirin EC 81 MG tablet Take 1 tablet (81 mg total) by mouth daily. Swallow whole. 90 tablet 4   atorvastatin (LIPITOR) 20 MG tablet Take 1 tablet (20 mg total) by mouth daily. 90 tablet 4   divalproex (DEPAKOTE) 500 MG DR tablet Take 500 mg by mouth 2 (two) times daily.      Melatonin 10 MG SUBL Place 1 tablet  under the tongue at bedtime. 90 tablet 4   mirtazapine (REMERON) 15 MG tablet Take 1 tablet (15 mg total) by mouth at bedtime. 90 tablet 4   risperiDONE (RISPERDAL) 0.5 MG tablet Take 1 tablet (0.5 mg total) by mouth 2 (two) times daily. (Patient taking differently: Take 0.75 mg by mouth 2 (two) times daily.) 60 tablet 1   Cholecalciferol (VITAMIN D-1000 MAX ST) 25 MCG (1000 UT) tablet Take by mouth. (Patient not taking: Reported on 07/04/2022)     divalproex (DEPAKOTE) 125 MG DR tablet Take by mouth. (Patient not taking: Reported on 06/16/2022)     LORazepam (ATIVAN) 0.5 MG tablet Take 0.5 mg by mouth 3 (three) times daily as needed. (Patient not taking: Reported on 06/24/2022)      Musculoskeletal: Strength & Muscle Tone: within normal limits Gait & Station:  did not observe Patient leans: N/A  Psychiatric Specialty Exam:  Presentation  General Appearance: Appropriate for Environment  Eye Contact:Good  Speech:Clear and Coherent  Speech Volume:Normal  Handedness:Right   Mood and Affect  Mood:Euthymic  Affect:Congruent   Thought Process  Thought Processes:Disorganized  Descriptions of Associations:Tangential  Orientation:None  Thought Content:-- (Dementia)  History of Schizophrenia/Schizoaffective disorder:No  Duration of Psychotic Symptoms:No data recorded Hallucinations:Hallucinations: -- (unable to give repsonse/does not appear to be rtis)  Ideas of Reference:None  Suicidal Thoughts:Suicidal Thoughts: No  Homicidal Thoughts:No data recorded  Sensorium  Memory:Recent Poor; Remote Poor; Immediate Poor  Judgment:Impaired  Insight:Lacking   Executive Functions  Concentration:Poor  Attention Span:Poor  Recall:Poor  Fund of Knowledge:Poor  Language:Poor   Psychomotor Activity  Psychomotor Activity:Psychomotor Activity: Normal  Assets  Assets:Physical Health; Resilience; Social Support; Financial  Resources/Insurance   Sleep  Sleep:Sleep: Fair  Physical Exam: Physical Exam Vitals and nursing note reviewed.  HENT:     Head: Normocephalic.     Nose: No congestion or rhinorrhea.  Eyes:     General:        Right eye: No discharge.        Left eye: No discharge.  Pulmonary:     Effort: Pulmonary effort is normal.  Musculoskeletal:        General: Normal range of motion.  Skin:    General: Skin is dry.  Neurological:     Mental Status: He is alert. Mental status is at baseline.  Psychiatric:        Attention and Perception: He is inattentive.        Mood and Affect: Mood and affect normal.        Speech: Speech normal.        Behavior: Behavior is cooperative.        Cognition and Memory: Cognition is impaired. He exhibits impaired recent memory and impaired remote memory.        Judgment: Judgment is impulsive.     Comments: Patient has known dementia. He appears at baseline    Review of Systems  Reason unable to perform ROS: limited by patient's dementia.  Respiratory: Negative.    Skin: Negative.   Psychiatric/Behavioral:  Positive for memory loss.        Dementia   Blood pressure 123/63, pulse (!) 56, temperature 98.2 F (36.8 C), temperature source Oral, resp. rate 10, SpO2 97 %. There is no height or weight on file to calculate BMI.  Treatment Plan Summary: Medication management and Plan Patient's Family Care Home is refusing to take him back and they state they have made guardian supervisor aware. Reviewed with EDP  .  Writer made [facility] referral.   Reordered PTA  medications.    Disposition: No evidence of imminent risk to self or others at present.   Patient does not meet criteria for psychiatric inpatient admission.  Vanetta Mulders, NP 2022-07-07 5:49 PM

## 2022-06-27 NOTE — TOC Initial Note (Signed)
Transition of Care [facility]) - Initial/Assessment Note    Patient Details  Name: Larry Buckley MRN: [id number] Date of Birth: 12-08-1940  [facility] CM/SW Contact:    Allayne Butcher, RN Phone Number: 2022-07-19, 5:11 PM  Clinical Narrative:                 Called and left a message for patient's legal guardian with [place] DSS, Sharlyne Cai- [phone] for return call.  RNCM will follow up tomorrow to see if Milinda Pointer can get the group home to accept patient back now that he has been calm since arriving at the hospital after he was given his medication at the group home.          Patient Goals and CMS Choice        Expected Discharge Plan and Services                                                Prior Living Arrangements/Services                       Activities of Daily Living      Permission Sought/Granted                  Emotional Assessment              Admission diagnosis:  beh med eval ems Patient Active Problem List   Diagnosis Date Noted   Acute pain of right shoulder 06/08/2022   Acute hip pain, left 06/08/2022   Low hemoglobin 04/26/2022   Edema of both legs 01/27/2022   Prediabetes 01/04/2022   Dementia with agitation (HCC) 12/29/2021   History of abdominal aortic aneurysm (AAA) repair 09/25/2021   B12 deficiency 07/11/2021   Hyperlipidemia, mixed    Hypertension    Gout, arthropathy    PCP:  Marjie Skiff, NP Pharmacy:   [facility] [place] [place] - [address] [place] [address] Phone: [phone] Fax: [phone]     Social Determinants of Health (SDOH) Interventions    Readmission Risk Interventions     No data to display

## 2022-06-27 NOTE — ED Notes (Signed)
Pt stating they need to have a BM.  Pt unable to ambulate with 2 assistance. Pt paced on bedpan and disoriented to situation, place, date and time. Pt unable to have BM. Pt redressed and placed in bed. Pt denies needs at this time.

## 2022-06-27 NOTE — ED Notes (Signed)
Patient refusing to remain in bed. Patient redirected several times back in bed. Patient uncooperative to verbal redirection. IM haldol given to L deltoid. No manual hold needed. Patient is cooperative but confused due to dementia.

## 2022-06-27 NOTE — ED Notes (Signed)
IVC 

## 2022-06-27 NOTE — BH Assessment (Signed)
Comprehensive Clinical Assessment (CCA) Screening, Triage and Referral Note  07-09-2022 Larry Buckley [id number]  Chief Complaint:  Chief Complaint  Patient presents with   IVC   Visit Diagnosis: Dementia with behavioral disturbance.  Larry Buckley is an 81 year old male who presents to the ER, after reported aggression at his care home. Patient dx with dementia and was unable to provide appropriate answers to the questions. He was unaware of the events that lead to him coming to the ER and the behaviors the care home ([phone]) shared. Per the care home, they were instructed to have the patient come to the ER by the DSS guardian. Due to patient's behaviors and threats towards staff and other residents, he is unable to return. Patient was giving his 30-day noticed and it expired Sept 1st.  Patient Reported Information How did you hear about [place]? Other (Comment)  What Is the Reason for Your Visit/Call Today? Patient from care home due to aggression.  How Long Has This Been Causing You Problems? 1-6 months  What Do You Feel Would Help You the Most Today? Treatment for Depression or other mood problem   Have You Recently Had Any Thoughts About Hurting Yourself? No  Are You Planning to Commit Suicide/Harm Yourself At This time? No   Have you Recently Had Thoughts About Hurting Someone Karolee Ohs? No  Are You Planning to Harm Someone at This Time? No  Explanation: No data recorded  Have You Used Any Alcohol or Drugs in the Past 24 Hours? No  How Long Ago Did You Use Drugs or Alcohol? No data recorded What Did You Use and How Much? No data recorded  Do You Currently Have a Therapist/Psychiatrist? No  Name of Therapist/Psychiatrist: No data recorded  Have You Been Recently Discharged From Any Office Practice or Programs? No  Explanation of Discharge From Practice/Program: No data recorded   CCA Screening Triage Referral Assessment Type of Contact:  Face-to-Face  Telemedicine Service Delivery:   Is this Initial or Reassessment? No data recorded Date Telepsych consult ordered in CHL:  No data recorded Time Telepsych consult ordered in CHL:  No data recorded Location of Assessment: [facility] ED  Provider Location: [facility] ED   Collateral Involvement: Unable to reach Care director   Does Patient Have a Court Appointed Legal Guardian? No data recorded Name and Contact of Legal Guardian: No data recorded If Minor and Not Living with Parent(s), Who has Custody? n/a  Is CPS involved or ever been involved? Never  Is APS involved or ever been involved? Currently   Patient Determined To Be At Risk for Harm To Self or Others Based on Review of Patient Reported Information or Presenting Complaint? No  Method: No data recorded Availability of Means: No data recorded Intent: No data recorded Notification Required: No data recorded Additional Information for Danger to Others Potential: No data recorded Additional Comments for Danger to Others Potential: No data recorded Are There Guns or Other Weapons in Your Home? No data recorded Types of Guns/Weapons: No data recorded Are These Weapons Safely Secured?                            No data recorded Who Could Verify You Are Able To Have These Secured: No data recorded Do You Have any Outstanding Charges, Pending Court Dates, Parole/Probation? No data recorded Contacted To Inform of Risk of Harm To Self or Others: Other: Comment   Does Patient Present under  Involuntary Commitment? Yes  IVC Papers Initial File Date: 07-22-22   [place] of Residence: [place]   Patient Currently Receiving the Following Services: Medication Management   Determination of Need: Emergent (2 hours)   Options For Referral: ED Visit   Discharge Disposition:    Lilyan Gilford MS, LCAS, [facility], [facility] Therapeutic Triage Specialist 2022/07/22 4:55 PM

## 2022-06-27 NOTE — ED Notes (Signed)
Pt attempting to get out of bed. Pt given dinner tray and TV turned on to CARE channel. Pt assisted back to bed. No needs verbalized at this time.

## 2022-06-27 NOTE — ED Notes (Signed)
Pt attempting to get out of bed at this time.

## 2022-06-27 NOTE — ED Notes (Signed)
Pt assisted to toilet with assistance x2.   PT had BM  Pt assisted back to bed. Posey alarm turned on. Pt denies any needs at this time.

## 2022-06-28 ENCOUNTER — Inpatient Hospital Stay: Admission: RE | Admit: 2022-06-28 | Payer: Medicare PPO | Source: Ambulatory Visit

## 2022-06-28 NOTE — ED Provider Notes (Signed)
Emergency Medicine Observation Re-evaluation Note  Larry Buckley is a 81 y.o. male, seen on rounds today.  Pt initially presented to the ED for complaints of IVC  Currently, the patient is calm, no acute complaints.  Physical Exam  Blood pressure 123/63, pulse (!) 56, temperature 98.2 F (36.8 C), temperature source Oral, resp. rate 10, SpO2 97 %. Physical Exam General: NAD Lungs: CTAB Psych: not agitated  ED Course / MDM  EKG:    I have reviewed the labs performed to date as well as medications administered while in observation.  Recent changes in the last 24 hours include no acute events overnight.    Plan  Current plan is for psych dispo. Patient is under full IVC at this time.   Sharman Cheek, MD 06/28/22 [phone]

## 2022-06-28 NOTE — ED Notes (Signed)
IVC/Pending Disposition 

## 2022-06-28 NOTE — ED Notes (Signed)
IVC pending placement 

## 2022-06-28 NOTE — TOC Initial Note (Signed)
Transition of Care [facility]) - Initial/Assessment Note    Patient Details  Name: Larry Buckley MRN: [id number] Date of Birth: 1941/03/20  Transition of Care [facility]) CM/SW Contact:    Allayne Butcher, RN Phone Number: 06/28/2022, 8:50 AM  Clinical Narrative:                 Spoke with guardian Sharlyne Cai this morning, she confirms that the family care home will not accept the patient back. They claim that he is aggressive.   Milinda Pointer has already sent out a referral to [facility] she will follow up with them today, she will also reach out to [employer] and [facility].    Expected Discharge Plan: Memory Care Barriers to Discharge: Other (must enter comment) (Facility will not accept patient back)   Patient Goals and CMS Choice   CMS Medicare.gov Compare Post Acute Care list provided to:: Legal Guardian Choice offered to / list presented to : [facility] POA / Guardian  Expected Discharge Plan and Services Expected Discharge Plan: Memory Care   Discharge Planning Services: CM Consult   Living arrangements for the past 2 months: Group Home                 DME Arranged: N/A DME Agency: NA       HH Arranged: NA HH Agency: NA        Prior Living Arrangements/Services Living arrangements for the past 2 months: Group Home Lives with:: Facility Resident Patient language and need for interpreter reviewed:: Yes Do you feel safe going back to the place where you live?: No   [facility] will not accept patient back  Need for Family Participation in Patient Care: Yes (Comment) Care giver support system in place?: Yes (comment)   Criminal Activity/Legal Involvement Pertinent to Current Situation/Hospitalization: No - Comment as needed  Activities of Daily Living      Permission Sought/Granted Permission sought to share information with : Guardian    Share Information with NAME: Sharlyne Cai  Permission granted to share info w AGENCY: Memory Care  Permission granted to share info w  Relationship: Legal Guardian with DSS  Permission granted to share info w Contact Information: [phone]  Emotional Assessment       Orientation: : Oriented to Self Alcohol / Substance Use: Not Applicable Psych Involvement: Yes (comment)  Admission diagnosis:  beh med eval ems Patient Active Problem List   Diagnosis Date Noted   Acute pain of right shoulder 06/08/2022   Acute hip pain, left 06/08/2022   Low hemoglobin 04/26/2022   Edema of both legs 01/27/2022   Prediabetes 01/04/2022   Dementia with behavioral disturbance (HCC) 12/29/2021   History of abdominal aortic aneurysm (AAA) repair 09/25/2021   B12 deficiency 07/11/2021   Hyperlipidemia, mixed    Hypertension    Gout, arthropathy    PCP:  Marjie Skiff, NP Pharmacy:   [facility] [place], [place] - [address] [place] [address] Phone: [phone] Fax: [phone]     Social Determinants of Health (SDOH) Interventions    Readmission Risk Interventions     No data to display

## 2022-06-28 NOTE — ED Notes (Signed)
Pt is asleep at this time, will receive lunch tray when he wakes.

## 2022-06-28 NOTE — ED Notes (Signed)
Pt given lunch tray.

## 2022-06-28 NOTE — ED Notes (Signed)
Pt given nighttime snack. 

## 2022-06-29 MED ORDER — HALOPERIDOL LACTATE 5 MG/ML IJ SOLN
2.5000 mg | Freq: Once | INTRAMUSCULAR | Status: AC
  Administered 2022-06-29: 2.5 mg via INTRAMUSCULAR
  Filled 2022-06-29: qty 1

## 2022-06-29 MED ORDER — LORAZEPAM 2 MG/ML IJ SOLN
INTRAMUSCULAR | Status: AC
  Administered 2022-06-29: 1 mg via INTRAMUSCULAR
  Filled 2022-06-29: qty 1

## 2022-06-29 MED ORDER — LORAZEPAM 2 MG/ML IJ SOLN: 1.0000 mg | Freq: Once | INTRAMUSCULAR | Status: AC

## 2022-06-29 MED ORDER — HALOPERIDOL LACTATE 5 MG/ML IJ SOLN
5.0000 mg | Freq: Once | INTRAMUSCULAR | Status: DC
  Filled 2022-06-29: qty 1

## 2022-06-29 NOTE — ED Notes (Signed)
Hospital meal provided.  100% consumed, pt tolerated w/o complaints.  Waste discarded appropriately.   

## 2022-06-29 NOTE — ED Notes (Signed)
Pt given a sandwich tray and a sprite

## 2022-06-29 NOTE — ED Notes (Signed)
IVC/  PENDING  PLACEMENT 

## 2022-06-29 NOTE — ED Notes (Signed)
Pt within sight of nursing station, side rails up, lowered bed, fall risk alarm functioning, non skid socks in place.

## 2022-06-29 NOTE — ED Notes (Signed)
Pt given dinner tray. Pt assisted to restroom by NT using wheelchair per pt's request.

## 2022-06-29 NOTE — ED Notes (Signed)
Security personnel remains at bedside to help de-escalate/redirect pt. EDP Isaacs states will put in orders soon.
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
## 2022-06-29 NOTE — ED Notes (Signed)
Lunch given.

## 2022-06-29 NOTE — ED Notes (Signed)
Patient provided snack at appropriate snack time.  Pt consumed 100% of snack provided, tolerated well w/o complaints   Trash disposted of appropriately by patient.  

## 2022-06-29 NOTE — ED Notes (Signed)
Pt provided with snack and drink, repositioned in bed at this time

## 2022-06-29 NOTE — ED Notes (Signed)
Once pt finished with his drink he attempted to walk hallway but due to concern for fall pt guided by this RN and Felicia NT back onto stretcher and assisted to reposition. Rails up. Pt denies any needs currently.

## 2022-06-29 NOTE — ED Provider Notes (Signed)
Emergency Medicine Observation Re-evaluation Note  Larry Buckley is a 81 y.o. male, seen on rounds today.  Pt initially presented to the ED for complaints of IVC Currently, the patient is resting, voices no medical complaints.  Physical Exam  BP (!) 144/63 (BP Location: Left Arm)   Pulse 71   Temp 98.6 F (37 C)   Resp 16   SpO2 99%  Physical Exam General: Resting in no acute distress Cardiac: No cyanosis Lungs: Equal rise and fall Psych: Not agitated  ED Course / MDM  EKG:EKG Interpretation  Date/Time:  Tuesday June 27 2022 12:26:18 EDT Ventricular Rate:  60 PR Interval:  240 QRS Duration: 213 QT Interval:  470 QTC Calculation: 454 R Axis:   32 Text Interpretation: Sinus rhythm Atrial premature complexes Prolonged PR interval Consider left atrial enlargement Nonspecific intraventricular conduction delay Abnormal T, consider ischemia, lateral leads Artifact in lead(s) I III aVR aVL aVF V2 V3 V4 V5 V6 Confirmed by UNCONFIRMED, DOCTOR ([id number]), editor Kearney Hard ([id number]) on 06/26/2022 2:11:41 PM  I have reviewed the labs performed to date as well as medications administered while in observation.  Recent changes in the last 24 hours include no events overnight.  Plan  Current plan is for psychiatric disposition.    Larry Hong, MD 06/29/22 [phone]
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
## 2022-06-30 NOTE — ED Notes (Signed)
Pt eating meal tray 

## 2022-06-30 NOTE — ED Notes (Signed)
IVC/  PENDING  PLACEMENT 

## 2022-06-30 NOTE — ED Notes (Signed)
Meal given

## 2022-06-30 NOTE — ED Notes (Signed)
Ivc /pending placement 

## 2022-06-30 NOTE — ED Notes (Signed)
Pt getting out of bed- able to walk with patient to bathroom initially. Peri-care performed by tech. Attempted to walk patient back to bed- unable to get patient back in bed. Attempting to push past security. Getting irritated with staff as we will not let him walk down the hall. Verbal deescalation techniques unsuccessful. IM medication ordered and administered. Pt agreeable to sit on bed and take IM medication voluntarily. Pt repositioned in bed at this time.

## 2022-06-30 NOTE — ED Notes (Signed)
Pt near constantly attempting to get out of bed, rover near pt for monitoring.

## 2022-06-30 NOTE — ED Notes (Signed)
Pt cleansed of incontinent urine. New scrubs placed, bed cleansed. Pt is a two person assist.

## 2022-06-30 NOTE — ED Notes (Signed)
IVC pending placement 

## 2022-06-30 NOTE — ED Notes (Signed)
Hospital Meal provided to pt. Pt ate 100%. Waste discarded appropriately.

## 2022-06-30 NOTE — ED Notes (Signed)
Pt continues to need frequent redirection not to attempt to get out of bed, pt is not able to walk without max 2 person assist at this time.

## 2022-06-30 NOTE — ED Notes (Signed)
Pt found to soiled himself. Full bed lien change done. Pericare done. Pt dressed in clean burgundy scrubs.

## 2022-06-30 NOTE — ED Notes (Signed)
Report to Heather, RN.

## 2022-06-30 NOTE — ED Notes (Signed)
Report given to April, RN

## 2022-06-30 NOTE — ED Notes (Signed)
Hospital meal provided.  100% consumed, pt tolerated w/o complaints.  Waste discarded appropriately.   

## 2022-07-01 LAB — URINALYSIS, ROUTINE W REFLEX MICROSCOPIC
Bilirubin Urine: NEGATIVE
Glucose, UA: NEGATIVE mg/dL
Hgb urine dipstick: NEGATIVE
Ketones, ur: NEGATIVE mg/dL
Leukocytes,Ua: NEGATIVE
Nitrite: NEGATIVE
Protein, ur: NEGATIVE mg/dL
Specific Gravity, Urine: 1.019 (ref 1.005–1.030)
pH: 6 (ref 5.0–8.0)

## 2022-07-01 LAB — COMPREHENSIVE METABOLIC PANEL
ALT: 15 U/L (ref 0–44)
AST: 24 U/L (ref 15–41)
Albumin: 2.9 g/dL — ABNORMAL LOW (ref 3.5–5.0)
Alkaline Phosphatase: 46 U/L (ref 38–126)
Anion gap: 6 (ref 5–15)
BUN: 15 mg/dL (ref 8–23)
CO2: 28 mmol/L (ref 22–32)
Calcium: 8.5 mg/dL — ABNORMAL LOW (ref 8.9–10.3)
Chloride: 106 mmol/L (ref 98–111)
Creatinine, Ser: 0.79 mg/dL (ref 0.61–1.24)
GFR, Estimated: >60 mL/min (ref >60)
Glucose, Bld: 102 mg/dL — ABNORMAL HIGH (ref 70–99)
Potassium: 4.4 mmol/L (ref 3.5–5.1)
Sodium: 140 mmol/L (ref 135–145)
Total Bilirubin: 0.5 mg/dL (ref 0.3–1.2)
Total Protein: 7 g/dL (ref 6.5–8.1)

## 2022-07-01 LAB — CBC WITH DIFFERENTIAL/PLATELET
Abs Immature Granulocytes: 0.01 10*3/uL (ref 0.00–0.07)
Basophils Absolute: 0.1 10*3/uL (ref 0.0–0.1)
Basophils Relative: 1 %
Eosinophils Absolute: 0.3 10*3/uL (ref 0.0–0.5)
Eosinophils Relative: 5 %
HCT: 35.8 % — ABNORMAL LOW (ref 39.0–52.0)
Hemoglobin: 11.5 g/dL — ABNORMAL LOW (ref 13.0–17.0)
Immature Granulocytes: 0 %
Lymphocytes Relative: 24 %
Lymphs Abs: 1.2 10*3/uL (ref 0.7–4.0)
MCH: 30.7 pg (ref 26.0–34.0)
MCHC: 32.1 g/dL (ref 30.0–36.0)
MCV: 95.7 fL (ref 80.0–100.0)
Monocytes Absolute: 0.9 10*3/uL (ref 0.1–1.0)
Monocytes Relative: 17 %
Neutro Abs: 2.7 10*3/uL (ref 1.7–7.7)
Neutrophils Relative %: 53 %
Platelets: 287 10*3/uL (ref 150–400)
RBC: 3.74 MIL/uL — ABNORMAL LOW (ref 4.22–5.81)
RDW: 14.9 % (ref 11.5–15.5)
WBC: 5.2 10*3/uL (ref 4.0–10.5)
nRBC: 0 % (ref 0.0–0.2)

## 2022-07-01 LAB — SARS CORONAVIRUS 2 BY RT PCR: SARS Coronavirus 2 by RT PCR: NEGATIVE

## 2022-07-01 LAB — TSH: TSH: 2.195 u[IU]/mL (ref 0.350–4.500)

## 2022-07-01 LAB — TROPONIN I (HIGH SENSITIVITY): Troponin I (High Sensitivity): 15 ng/L (ref <18)

## 2022-07-01 MED ORDER — DROPERIDOL 2.5 MG/ML IJ SOLN
2.5000 mg | Freq: Once | INTRAMUSCULAR | Status: AC
  Administered 2022-07-01: 2.5 mg via INTRAMUSCULAR

## 2022-07-01 NOTE — ED Notes (Signed)
IVC pending placement 

## 2022-07-01 NOTE — ED Notes (Signed)
Pt cleansed of incontinent urine and stool while obtaining in and out urine sample.

## 2022-07-01 NOTE — ED Notes (Signed)
Breakfast delivered at this time. Pt currently sleeping.

## 2022-07-01 NOTE — ED Notes (Signed)
Dinner tray delivered at this time. Pt sitting on the side of the bed eating at this time.

## 2022-07-01 NOTE — ED Notes (Signed)
Pt urinated on the floor, bed is soiled. Pt walked to the bathroom with a walker and 1 person assistance. Pt bed linen, bed pad and brief changed at this time.

## 2022-07-01 NOTE — ED Notes (Signed)
Pt ambulatory with 1 person assistance. Pt refusing to use walker. Pt walked to the bathroom but did it not use it. Pt is clean and dry at this time.

## 2022-07-01 NOTE — ED Notes (Signed)
Patient is IVC pending placement 

## 2022-07-01 NOTE — ED Notes (Signed)
Was given a sandwhch tray, vanilla icecream and a cup of soda for snack.

## 2022-07-01 NOTE — ED Notes (Signed)
Dr. Leonides Schanz notified that pt is now not able to stand on own, per staff that took care of him yesterday, pt was able to walk with two person assist.

## 2022-07-01 NOTE — ED Notes (Signed)
Pt eating chips and a donut. Pt continues to need very frequent redirection to stay in bed, pt is a high fall risk.

## 2022-07-01 NOTE — ED Notes (Signed)
Pt cleansed of incontinent urine, new clothing placed, bed cleansed, new sheets placed.

## 2022-07-01 NOTE — ED Notes (Signed)
Lunch meal tray delivered at this time. Pt is sleeping currently.

## 2022-07-01 NOTE — ED Notes (Signed)
Pt states he needs to get up and use restroom. Pt is a three person assist to toilet, hitting staff at times. No urine or stool production noted, pt passed large amounts of flatus.

## 2022-07-01 NOTE — ED Provider Notes (Addendum)
2:00 AM  I was asked by nursing staff to evaluate patient.  Patient here under IVC for psychiatric disposition due to dementia and aggressive behavior.  Patient normally ambulates with a walker but currently is unable to stand without significant assistance.  He denies any pain.  No known falls.  Repeat vitals normal.  Will obtain EKG, repeat blood work, urinalysis.    Date: 07/01/2022  Rate: 58  Rhythm: normal sinus rhythm  QRS Axis: normal  Intervals: normal  ST/T Wave abnormalities: normal  Conduction Disutrbances: none  Narrative Interpretation: unremarkable   4:09 AM  Pt's repeat lab work is unremarkable.  Normal electrolytes, renal function.  No leukocytosis.  Improving anemia compared to previous.  Urine shows no sign of infection or dehydration.  Thyroid function normal.  EKG nonischemic.  Troponin negative.  Patient has no focal neurologic deficits.  No medical indication for admission at this time.  PT consult placed.   Patient has been psychiatrically cleared and is awaiting case management placement into a memory care unit.  He is under full IVC at this time.        Sarha Bartelt, Delice Bison, DO 07/01/22 [phone]

## 2022-07-01 NOTE — ED Notes (Signed)
Report to ashley, rn

## 2022-07-02 NOTE — ED Notes (Signed)
Pt's provided snack, brief was changed, and a warm blanket given. Pt is now sleeping.

## 2022-07-02 NOTE — ED Notes (Signed)
Pt given water and chocolate ice cream.

## 2022-07-02 NOTE — Progress Notes (Signed)
PT Cancellation Note  Patient Details Name: ELOHIM BRUNE MRN: [id number] DOB: 1941-07-04   Cancelled Treatment:    Reason Eval/Treat Not Completed: Other (comment). Consult received and chart reviewed. Per discussion with RN, pt medicated for agitation and is heavily sedated at this time. Will re-attempt later today if possible and pt awake.   Janijah Symons 07/02/2022, 9:24 AM [name], PT, DPT, GCS [phone]

## 2022-07-02 NOTE — ED Notes (Signed)
Helped pt get up and sit on a bench. Also assited in brushing his teeth and washing face and hands.

## 2022-07-02 NOTE — ED Notes (Signed)
Patient was given sandwhich tray and a cup of soda. 

## 2022-07-02 NOTE — ED Notes (Signed)
Pt was given a dinner tray and beverage. 

## 2022-07-02 NOTE — Evaluation (Signed)
Physical Therapy Evaluation Patient Details Name: Larry Buckley MRN: [id number] DOB: Oct 15, 1941 Today's Date: 07/02/2022  History of Present Illness  Pt admitted for dementia iwth behavior disturbance. Pt from family care home and is poor historian.  Clinical Impression  Pt is a pleasantly confused 81 year old male who was admitted for dementia with behavioral disturbance. Pt performs transfers and ambulation with cga and RW. Pt thinks he is at school and is easily distracted by situation around him. Needs heavy verbal/tactile cues, however ends up walking short distance fairly easily. Pt is poor historian, unsure of true functional baseline, however does seem familiar with RW. Pt demonstrates deficits with strength/mobility/cognition. Would benefit from skilled PT to address above deficits and promote optimal return to PLOF. Do not think he is SNF appropriate at this time. Will keep on PT caseload as a trial basis to assess if pt can make progress towards goals. Per review, it appears he will need more permanent living situation after discharge from hospital. Pt is very confused with limited insight to situation.     Recommendations for follow up therapy are one component of a multi-disciplinary discharge planning process, led by the attending physician.  Recommendations may be updated based on patient status, additional functional criteria and insurance authorization.  Follow Up Recommendations Home health PT (possible memory care or LTC facility)      Assistance Recommended at Discharge Frequent or constant Supervision/Assistance  Patient can return home with the following  A little help with walking and/or transfers;A lot of help with bathing/dressing/bathroom    Equipment Recommendations  (TBD)  Recommendations for Other Services       Functional Status Assessment Patient has had a recent decline in their functional status and/or demonstrates limited ability to make significant  improvements in function in a reasonable and predictable amount of time     Precautions / Restrictions Precautions Precautions: Fall Restrictions Weight Bearing Restrictions: No      Mobility  Bed Mobility               General bed mobility comments: NT, received seated on bench    Transfers Overall transfer level: Needs assistance Equipment used: None Transfers: Sit to/from Stand Sit to Stand: Min guard           General transfer comment: heavy cues for sequencing including hand placement. Once standing able to place hands on RW. Upright posture    Ambulation/Gait Ambulation/Gait assistance: Min guard Gait Distance (Feet): 5 Feet Assistive device: Rolling walker (2 wheels) Gait Pattern/deviations: Step-to pattern       General Gait Details: due to being in hallway, pt is easily distracted and has difficulty following therapist cues. Pt able to walk over to recliner and sit with heavy cues  Stairs            Wheelchair Mobility    Modified Rankin (Stroke Patients Only)       Balance Overall balance assessment: Needs assistance Sitting-balance support: Feet supported Sitting balance-Leahy Scale: Good     Standing balance support: Bilateral upper extremity supported Standing balance-Leahy Scale: Fair                               Pertinent Vitals/Pain Pain Assessment Pain Assessment: No/denies pain    Home Living Family/patient expects to be discharged to:: Group home  Additional Comments: pt is poor historian, per chart pt from family care home, however are unwilling to let him return due to agitation    Prior Function Prior Level of Function : Independent/Modified Independent;Driving             Mobility Comments: using RW       Hand Dominance        Extremity/Trunk Assessment   Upper Extremity Assessment Upper Extremity Assessment: Overall WFL for tasks assessed    Lower Extremity  Assessment Lower Extremity Assessment: Generalized weakness (B LE grossly 4/5)       Communication   Communication: No difficulties  Cognition Arousal/Alertness: Awake/alert Behavior During Therapy: WFL for tasks assessed/performed Overall Cognitive Status: History of cognitive impairments - at baseline                                 General Comments: thinks he is at school, unable to answer further questions        General Comments      Exercises     Assessment/Plan    PT Assessment Patient needs continued PT services (trial basis of PT to see if progress made)  PT Problem List Decreased cognition;Decreased balance;Decreased mobility;Decreased strength       PT Treatment Interventions DME instruction;Gait training;Therapeutic exercise    PT Goals (Current goals can be found in the Care Plan section)  Acute Rehab PT Goals Patient Stated Goal: unable to state PT Goal Formulation: Patient unable to participate in goal setting Time For Goal Achievement: 07/16/22 Potential to Achieve Goals: Fair    Frequency  (3 day trial)     Co-evaluation               AM-PAC PT "6 Clicks" Mobility  Outcome Measure Help needed turning from your back to your side while in a flat bed without using bedrails?: A Little Help needed moving from lying on your back to sitting on the side of a flat bed without using bedrails?: A Little Help needed moving to and from a bed to a chair (including a wheelchair)?: A Little Help needed standing up from a chair using your arms (e.g., wheelchair or bedside chair)?: A Little Help needed to walk in hospital room?: A Little Help needed climbing 3-5 steps with a railing? : A Little 6 Click Score: 18    End of Session   Activity Tolerance: Patient tolerated treatment well Patient left: in chair (security guard on duty) Nurse Communication: Mobility status PT Visit Diagnosis: Unsteadiness on feet (R26.81);Muscle weakness  (generalized) (M62.81);Difficulty in walking, not elsewhere classified (R26.2)    Time: 5284-1324 PT Time Calculation (min) (ACUTE ONLY): 8 min   Charges:   PT Evaluation $PT Eval Low Complexity: 1 Low          [name], PT, DPT, GCS [phone]   Traniece Boffa 07/02/2022, 3:07 PM

## 2022-07-02 NOTE — ED Notes (Signed)
VOL, papers rescinded, pend TOC placement
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
## 2022-07-02 NOTE — ED Provider Notes (Signed)
    07/01/2022    7:33 PM 07/01/2022    5:08 PM 07/01/2022    9:55 AM  Vitals with BMI  Systolic 539   [phone]  Diastolic 56   56 93 91  Pulse 63   63 82 67    Patient sleeping at this time. Awaiting SW disposition. No other changes in care plan.    Rada Hay, MD 07/02/22 [phone]
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
## 2022-07-02 NOTE — ED Notes (Signed)
Pt given lunch tray.

## 2022-07-02 NOTE — ED Notes (Signed)
Gave pt apple sauce

## 2022-07-02 NOTE — ED Notes (Signed)
Pt given Sprite and crackers 

## 2022-07-03 MED ORDER — LORAZEPAM 1 MG PO TABS
1.0000 mg | ORAL_TABLET | Freq: Three times a day (TID) | ORAL | Status: DC | PRN
  Administered 2022-07-03 – 2022-07-10 (×9): 1 mg via ORAL
  Filled 2022-07-03 (×9): qty 1

## 2022-07-03 NOTE — NC FL2 (Signed)
[place] MEDICAID FL2 LEVEL OF CARE SCREENING TOOL     IDENTIFICATION  Patient Name: Larry Buckley Birthdate: 03/21/1941 Sex: male Admission Date (Current Location): 2022/07/27  [facility] and [place] Number:  Chiropodist and Address:  [facility], [address] [place], [place] [address]      Provider Number: [id number]  Attending Physician Name and Address:  No att. providers found  Relative Name and Phone Number:  Letitia Neri DSS legal guardian- [phone]    Current Level of Care: SNF Recommended Level of Care: Memory Care, Skilled Nursing Facility Prior Approval Number:    Date Approved/Denied:   PASRR Number: [id number]  Discharge Plan: SNF (Memory Care)    Current Diagnoses: Patient Active Problem List   Diagnosis Date Noted   Acute pain of right shoulder 06/08/2022   Acute hip pain, left 06/08/2022   Low hemoglobin 04/26/2022   Edema of both legs 01/27/2022   Prediabetes 01/04/2022   Dementia with behavioral disturbance (HCC) 12/29/2021   History of abdominal aortic aneurysm (AAA) repair 09/25/2021   B12 deficiency 07/11/2021   Hyperlipidemia, mixed    Hypertension    Gout, arthropathy     Orientation RESPIRATION BLADDER Height & Weight     Self  Normal Continent Weight:   Height:     BEHAVIORAL SYMPTOMS/MOOD NEUROLOGICAL BOWEL NUTRITION STATUS  Wanderer   Continent Diet (Regular)  AMBULATORY STATUS COMMUNICATION OF NEEDS Skin   Limited Assist Verbally Normal                       Personal Care Assistance Level of Assistance  Bathing, Feeding, Dressing Bathing Assistance: Limited assistance Feeding assistance: Limited assistance Dressing Assistance: Limited assistance     Functional Limitations Info             SPECIAL CARE FACTORS FREQUENCY  PT (By licensed PT), OT (By licensed OT)     PT Frequency: 2-3 times per week OT Frequency: 2 times per week            Contractures  Contractures Info: Not present    Additional Factors Info  Code Status, Allergies Code Status Info: Full Allergies Info: Lotensin           Current Medications (07/03/2022):  This is the current hospital active medication list Current Facility-Administered Medications  Medication Dose Route Frequency Provider Last Rate Last Admin   amLODipine (NORVASC) tablet 5 mg  5 mg Oral Daily Gabriel Cirri F, NP   5 mg at 07/02/22 1019   aspirin EC tablet 81 mg  81 mg Oral Daily Gabriel Cirri F, NP   81 mg at 07/03/22 5732   atorvastatin (LIPITOR) tablet 20 mg  20 mg Oral QHS Gabriel Cirri F, NP   20 mg at 07/02/22 2121   divalproex (DEPAKOTE) DR tablet 500 mg  500 mg Oral BID Gabriel Cirri F, NP   500 mg at 07/03/22 0936   LORazepam (ATIVAN) tablet 1 mg  1 mg Oral Q8H PRN Dionne Bucy, MD   1 mg at 07/03/22 1617   melatonin tablet 10 mg  10 mg Oral QHS Gabriel Cirri F, NP   10 mg at 07/02/22 2122   mirtazapine (REMERON) tablet 15 mg  15 mg Oral QHS Gabriel Cirri F, NP   15 mg at 07/02/22 2122   risperiDONE (RISPERDAL) tablet 0.75 mg  0.75 mg Oral BID Gabriel Cirri F, NP   0.75 mg at 07/03/22 [phone]  Current Outpatient Medications  Medication Sig Dispense Refill   amLODipine (NORVASC) 5 MG tablet Take 1 tablet (5 mg total) by mouth daily. 90 tablet 4   aspirin EC 81 MG tablet Take 1 tablet (81 mg total) by mouth daily. Swallow whole. 90 tablet 4   atorvastatin (LIPITOR) 20 MG tablet Take 1 tablet (20 mg total) by mouth daily. 90 tablet 4   divalproex (DEPAKOTE) 500 MG DR tablet Take 500 mg by mouth 2 (two) times daily.     Melatonin 10 MG SUBL Place 1 tablet under the tongue at bedtime. 90 tablet 4   mirtazapine (REMERON) 15 MG tablet Take 1 tablet (15 mg total) by mouth at bedtime. 90 tablet 4   risperiDONE (RISPERDAL) 0.5 MG tablet Take 1 tablet (0.5 mg total) by mouth 2 (two) times daily. (Patient taking differently: Take 0.75 mg by mouth 2 (two) times daily.) 60  tablet 1   Cholecalciferol (VITAMIN D-1000 MAX ST) 25 MCG (1000 UT) tablet Take by mouth. (Patient not taking: Reported on 07/08/2022)     divalproex (DEPAKOTE) 125 MG DR tablet Take by mouth. (Patient not taking: Reported on 06/18/2022)     LORazepam (ATIVAN) 0.5 MG tablet Take 0.5 mg by mouth 3 (three) times daily as needed. (Patient not taking: Reported on 06/18/2022)       Discharge Medications: Please see discharge summary for a list of discharge medications.  Relevant Imaging Results:  Relevant Lab Results:   Additional Information SSN: [id number]  Shelbie Hutching, RN

## 2022-07-03 NOTE — ED Notes (Signed)
Lunch tray given to pt.

## 2022-07-03 NOTE — ED Provider Notes (Signed)
-----------------------------------------   4:43 AM on 07/03/2022 -----------------------------------------   Blood pressure (!) 97/42, pulse 67, temperature 99.2 F (37.3 C), resp. rate 17, SpO2 98 %.  The patient is calm and cooperative at this time.  There have been no acute events since the last update.  Awaiting disposition plan from case management/social work.    Syann Cupples, Delice Bison, DO 07/03/22 [phone]

## 2022-07-03 NOTE — Progress Notes (Signed)
Physical Therapy Treatment Patient Details Name: Larry Buckley MRN: [id number] DOB: May 05, 1941 Today's Date: 07/03/2022   History of Present Illness Pt admitted for dementia iwth behavior disturbance. Pt from family care home and is poor historian.    PT Comments    Pt in bed, sitting.  Agrees to gait.  Stood and is able to walk 140' in hallway with RW and min guard x 1.  Pt does need mod verbal cues for proper walker use and occasionally min a for redirection as he will take hands off walker and try to step/turn without walker.  Upon return back to bed in hallway, pt stands for an extended amount of time eating pretzels in standing without UE support of LOB.  He is eventually able to be directed back to chair to sit and remains up with needs in reach.    Pt is progressing with mobility.  Mobility wise pt is appropriate to return to family care home if they can provide +1 assist.  Per TOC notes, family care home is not willing to accept pt.  Given cognition and need for +1 assist and supervision, Memory care of SNF may be necessary and appropriate. Pt is generally unsafe to walk unassisted due to decreased safety awareness.    Recommendations for follow up therapy are one component of a multi-disciplinary discharge planning process, led by the attending physician.  Recommendations may be updated based on patient status, additional functional criteria and insurance authorization.  Follow Up Recommendations  Home health PT     Assistance Recommended at Discharge Frequent or constant Supervision/Assistance  Patient can return home with the following A little help with walking and/or transfers;A lot of help with bathing/dressing/bathroom   Equipment Recommendations  Rolling walker (2 wheels)    Recommendations for Other Services       Precautions / Restrictions Precautions Precautions: Fall Restrictions Weight Bearing Restrictions: No     Mobility  Bed Mobility                General bed mobility comments: sitting EOB upon arrival and in chair after    Transfers Overall transfer level: Needs assistance Equipment used: Rolling walker (2 wheels) Transfers: Sit to/from Stand Sit to Stand: Min guard                Ambulation/Gait Ambulation/Gait assistance: Min guard Gait Distance (Feet): 140 Feet Assistive device: Rolling walker (2 wheels) Gait Pattern/deviations: Step-through pattern, Decreased step length - right, Decreased step length - left Gait velocity: decreased     General Gait Details: slow shuffling gait with cues for proper walker use   Stairs             Wheelchair Mobility    Modified Rankin (Stroke Patients Only)       Balance Overall balance assessment: Needs assistance Sitting-balance support: Feet supported Sitting balance-Leahy Scale: Good     Standing balance support: Bilateral upper extremity supported Standing balance-Leahy Scale: Fair Standing balance comment: with and without UE support in standing                            Cognition Arousal/Alertness: Awake/alert Behavior During Therapy: WFL for tasks assessed/performed Overall Cognitive Status: History of cognitive impairments - at baseline  Exercises      General Comments        Pertinent Vitals/Pain Pain Assessment Pain Assessment: No/denies pain    Home Living                          Prior Function            PT Goals (current goals can now be found in the care plan section) Progress towards PT goals: Progressing toward goals    Frequency    Min 2X/week      PT Plan Current plan remains appropriate    Co-evaluation              AM-PAC PT "6 Clicks" Mobility   Outcome Measure  Help needed turning from your back to your side while in a flat bed without using bedrails?: None Help needed moving from lying on your back to sitting on the  side of a flat bed without using bedrails?: A Little Help needed moving to and from a bed to a chair (including a wheelchair)?: A Little Help needed standing up from a chair using your arms (e.g., wheelchair or bedside chair)?: A Little Help needed to walk in hospital room?: A Little Help needed climbing 3-5 steps with a railing? : A Little 6 Click Score: 19    End of Session Equipment Utilized During Treatment: Gait belt Activity Tolerance: Patient tolerated treatment well Patient left: in chair Nurse Communication: Mobility status PT Visit Diagnosis: Unsteadiness on feet (R26.81);Muscle weakness (generalized) (M62.81);Difficulty in walking, not elsewhere classified (R26.2)     Time: 0116-0129 PT Time Calculation (min) (ACUTE ONLY): 13 min  Charges:  $Gait Training: 8-22 mins                   Chesley Noon, PTA 07/03/22, 1:55 PM

## 2022-07-03 NOTE — ED Notes (Signed)
Patient again trying to get up from bed.  Patient ambulated down hallway and back x 2 with assistance to satisfy patient's desire to walk.  Patient seated in recliner for comfort.

## 2022-07-03 NOTE — ED Notes (Signed)
VOL  PENDING  PLACEMENT 

## 2022-07-03 NOTE — TOC Progression Note (Signed)
Transition of Care [facility]) - Progression Note    Patient Details  Name: Larry Buckley MRN: [id number] Date of Birth: 04/19/1941  Transition of Care [facility]) CM/SW Contact  Shelbie Hutching, RN Phone Number: 07/03/2022, 12:59 PM  Clinical Narrative:    Jeralene Huff out to [place] Moos with [facility], so far they have not been able to find any family care homes willing to accept patient, they will cont to search for placement.  RNCM will do a skilled bed search for Memory Care.    Expected Discharge Plan: Memory Care Barriers to Discharge: Other (must enter comment) (Facility will not accept patient back)  Expected Discharge Plan and Services Expected Discharge Plan: Memory Care   Discharge Planning Services: CM Consult   Living arrangements for the past 2 months: Group Home                 DME Arranged: N/A DME Agency: NA       HH Arranged: NA HH Agency: NA         Social Determinants of Health (SDOH) Interventions    Readmission Risk Interventions     No data to display

## 2022-07-03 NOTE — ED Notes (Signed)
Pt breakfast provided with beverage. Pt sitting up on stretcher in hallway.

## 2022-07-03 NOTE — Evaluation (Signed)
Occupational Therapy Evaluation Patient Details Name: Larry Buckley MRN: [id number] DOB: 1941-02-28 Today's Date: 07/03/2022   History of Present Illness Pt admitted for dementia with behavior disturbance. Pt from family care home and is a poor historian.   Clinical Impression   Patient presenting with decreased independence in self-care, functional mobility, cognition, and safety. Pt in bed and asleep upon arrival. Poor historian, unable to provide any info on PLOF. Unable to follow VC.  Pt able to initially transfer supine <> sit with min A, then required mod A when returned back to bed. While EOB pt perseveration on donning socks, unable to redirect, requiring total A to donn socks secondary to dementia. Pt stated he wanted to stand, but was unable to initiate standing using RW x3. RN reports ambulating in hallway with staff. No skilled acute OT recommended, secondary to pt with significant cognitive deficits at baseline. OT to complete order.      Recommendations for follow up therapy are one component of a multi-disciplinary discharge planning process, led by the attending physician.  Recommendations may be updated based on patient status, additional functional criteria and insurance authorization.   Follow Up Recommendations  Long-term institutional care without follow-up therapy    Assistance Recommended at Discharge Frequent or constant Supervision/Assistance  Patient can return home with the following A lot of help with walking and/or transfers;Assist for transportation;Direct supervision/assist for medications management;Direct supervision/assist for financial management;Assistance with cooking/housework;A lot of help with bathing/dressing/bathroom    Functional Status Assessment  Patient has had a recent decline in their functional status and/or demonstrates limited ability to make significant improvements in function in a reasonable and predictable amount of time  Equipment  Recommendations  Other (comment) (Defer to next venue of care.)    Recommendations for Other Services       Precautions / Restrictions Precautions Precautions: Fall Restrictions Weight Bearing Restrictions: No      Mobility Bed Mobility Overal bed mobility: Needs Assistance Bed Mobility: Supine to Sit     Supine to sit: Min assist, Mod assist          Transfers                          Balance Overall balance assessment: Needs assistance Sitting-balance support: No upper extremity supported, Feet supported Sitting balance-Leahy Scale: Good                                     ADL either performed or assessed with clinical judgement   ADL Overall ADL's : Needs assistance/impaired                     Lower Body Dressing: Total assistance Lower Body Dressing Details (indicate cue type and reason): Patient was able to doff socks, but required total assist to donn secondary to dememtia.               General ADL Comments: Pt very lethargic and restless during session.      Extremity/Trunk Assessment Upper Extremity Assessment Upper Extremity Assessment: Generalized weakness   Lower Extremity Assessment Lower Extremity Assessment: Generalized weakness          Cognition Arousal/Alertness: Lethargic Behavior During Therapy: Restless Overall Cognitive Status: History of cognitive impairments - at baseline  Home Living Family/patient expects to be discharged to:: Group home                                 Additional Comments: pt is poor historian, per chart pt from family care home, however are unwilling to let him return due to agitation      Prior Functioning/Environment Prior Level of Function : Independent/Modified Independent;Driving             Mobility Comments: using RW                   OT Goals(Current goals can  be found in the care plan section) Acute Rehab OT Goals Patient Stated Goal: none stated. OT Goal Formulation: With patient Time For Goal Achievement: 07/03/22 Potential to Achieve Goals: Fair   AM-PAC OT "6 Clicks" Daily Activity     Outcome Measure Help from another person eating meals?: A Lot Help from another person taking care of personal grooming?: A Lot Help from another person toileting, which includes using toliet, bedpan, or urinal?: A Lot Help from another person bathing (including washing, rinsing, drying)?: A Lot Help from another person to put on and taking off regular upper body clothing?: A Lot Help from another person to put on and taking off regular lower body clothing?: A Lot 6 Click Score: 12   End of Session Nurse Communication: Mobility status  Activity Tolerance: Patient limited by lethargy Patient left: in bed;with call bell/phone within reach                   Time: 3299-2426 OT Time Calculation (min): 16 min Charges:  OT General Charges $OT Visit: 1 Visit OT Evaluation $OT Eval Moderate Complexity: 1 Mod   Vista Sawatzky, OTS 07/03/2022, 3:49 PM

## 2022-07-03 NOTE — ED Notes (Signed)
Pt up to restroom with 2 person assist. Pt urinated on floor of restroom.

## 2022-07-03 NOTE — ED Notes (Signed)
Patient attempting to get up from bed to ambulate, when asked where he was going patient responded "I don't know".  Patient redirected back to bed after several tries.

## 2022-07-03 NOTE — ED Notes (Signed)
VOL/Pending TOC Placement 

## 2022-07-03 NOTE — ED Notes (Signed)
Pt given dinner tray with beverage. 

## 2022-07-03 NOTE — ED Notes (Signed)
Pt resting on stretcher with eyes closed and even respirations. No acute distress noted at this time.

## 2022-07-04 ENCOUNTER — Emergency Department: Payer: Medicare PPO

## 2022-07-04 DIAGNOSIS — F03918 Unspecified dementia, unspecified severity, with other behavioral disturbance: Secondary | ICD-10-CM | POA: Diagnosis not present

## 2022-07-04 DIAGNOSIS — R627 Adult failure to thrive: Secondary | ICD-10-CM | POA: Diagnosis present

## 2022-07-04 LAB — CBC WITH DIFFERENTIAL/PLATELET
Abs Immature Granulocytes: 0.02 10*3/uL (ref 0.00–0.07)
Basophils Absolute: 0.1 10*3/uL (ref 0.0–0.1)
Basophils Relative: 1 %
Eosinophils Absolute: 0.3 10*3/uL (ref 0.0–0.5)
Eosinophils Relative: 5 %
HCT: 41.1 % (ref 39.0–52.0)
Hemoglobin: 12.6 g/dL — ABNORMAL LOW (ref 13.0–17.0)
Immature Granulocytes: 0 %
Lymphocytes Relative: 29 %
Lymphs Abs: 1.6 10*3/uL (ref 0.7–4.0)
MCH: 31.3 pg (ref 26.0–34.0)
MCHC: 30.7 g/dL (ref 30.0–36.0)
MCV: 102 fL — ABNORMAL HIGH (ref 80.0–100.0)
Monocytes Absolute: 0.7 10*3/uL (ref 0.1–1.0)
Monocytes Relative: 13 %
Neutro Abs: 3 10*3/uL (ref 1.7–7.7)
Neutrophils Relative %: 52 %
Platelets: 268 10*3/uL (ref 150–400)
RBC: 4.03 MIL/uL — ABNORMAL LOW (ref 4.22–5.81)
RDW: 14.8 % (ref 11.5–15.5)
WBC: 5.7 10*3/uL (ref 4.0–10.5)
nRBC: 0 % (ref 0.0–0.2)

## 2022-07-04 LAB — COMPREHENSIVE METABOLIC PANEL
ALT: 16 U/L (ref 0–44)
AST: 23 U/L (ref 15–41)
Albumin: 3 g/dL — ABNORMAL LOW (ref 3.5–5.0)
Alkaline Phosphatase: 52 U/L (ref 38–126)
Anion gap: 10 (ref 5–15)
BUN: 14 mg/dL (ref 8–23)
CO2: 25 mmol/L (ref 22–32)
Calcium: 8.8 mg/dL — ABNORMAL LOW (ref 8.9–10.3)
Chloride: 103 mmol/L (ref 98–111)
Creatinine, Ser: 0.71 mg/dL (ref 0.61–1.24)
GFR, Estimated: >60 mL/min (ref >60)
Glucose, Bld: 106 mg/dL — ABNORMAL HIGH (ref 70–99)
Potassium: 4.5 mmol/L (ref 3.5–5.1)
Sodium: 138 mmol/L (ref 135–145)
Total Bilirubin: 0.8 mg/dL (ref 0.3–1.2)
Total Protein: 7.3 g/dL (ref 6.5–8.1)

## 2022-07-04 MED ORDER — ONDANSETRON HCL 4 MG/2ML IJ SOLN: 4.0000 mg | Freq: Four times a day (QID) | INTRAMUSCULAR | Status: DC | PRN

## 2022-07-04 MED ORDER — ENOXAPARIN SODIUM 40 MG/0.4ML IJ SOSY
40.0000 mg | PREFILLED_SYRINGE | INTRAMUSCULAR | Status: DC
  Administered 2022-07-05 – 2022-07-15 (×10): 40 mg via SUBCUTANEOUS
  Filled 2022-07-04 (×12): qty 0.4

## 2022-07-04 MED ORDER — POLYETHYLENE GLYCOL 3350 17 G PO PACK: 17.0000 g | PACK | Freq: Every day | ORAL | Status: DC | PRN

## 2022-07-04 MED ORDER — SODIUM CHLORIDE 0.9 % IV SOLN: INTRAVENOUS | Status: AC

## 2022-07-04 MED ORDER — ACETAMINOPHEN 325 MG PO TABS
650.0000 mg | ORAL_TABLET | Freq: Four times a day (QID) | ORAL | Status: DC | PRN
  Administered 2022-07-16 – 2022-09-23 (×8): 650 mg via ORAL
  Filled 2022-07-04 (×8): qty 2

## 2022-07-04 NOTE — ED Notes (Signed)
Incontinent of urine, full linen, clothes and brief change.

## 2022-07-04 NOTE — ED Provider Notes (Signed)
I was asked to see the patient because he has become very weak.  Today he is not walking anymore.  He has urinated on himself several times.  He has been confused and combative a couple times as well.  Nurses informed me that this is unusual for this patient he was awake alert and walking by himself toileting himself several days ago.  He has deteriorated markedly.  He needs help being fed now.  CT of the head does not show anything acute.  CT of the chest shows a possible atelectasis or infiltrate bilaterally in the bases.  This is new.  Patient does not have a fever and is does not have a white count possibly to atelectasis.  I will also check a Depakote level.  We will see if we can possibly get this gentleman upstairs in her room possibly with some physical therapy etc.   Nena Polio, MD 07/04/22 2033

## 2022-07-04 NOTE — ED Notes (Signed)
Pt verbally and physically abusive to staff at this time.

## 2022-07-04 NOTE — ED Notes (Signed)
Lunch provided.

## 2022-07-04 NOTE — ED Notes (Signed)
Vol /pending placement 

## 2022-07-04 NOTE — ED Notes (Signed)
Incontinent of urine- full linen change performed by this RN and Caitlyn, NT

## 2022-07-04 NOTE — ED Notes (Signed)
Breakfast provided.

## 2022-07-04 NOTE — ED Provider Notes (Signed)
-----------------------------------------   5:36 AM on 07/04/2022 -----------------------------------------   Blood pressure (!) 144/81, pulse 63, temperature 99 F (37.2 C), resp. rate 18, SpO2 99 %.  The patient is calm and cooperative at this time.  There have been no acute events since the last update.  Awaiting disposition plan from Social Work team.   Paulette Blanch, MD 07/04/22 [phone]

## 2022-07-04 NOTE — Progress Notes (Signed)
PT Cancellation Note  Patient Details Name: PERRI LAMAGNA MRN: [id number] DOB: 02-26-1941   Cancelled Treatment:    Reason Eval/Treat Not Completed: Other (comment).  Chart reviewed and attempted to see pt.  Nursing requesting to hold seeing pt at this time as he has been restless.  Will re-attempt as time allows today.     Gwenlyn Saran, PT, DPT 07/04/22, 10:47 AM

## 2022-07-04 NOTE — ED Notes (Signed)
Pt requiring assistance with feeding at this time

## 2022-07-04 NOTE — H&P (Addendum)
History and Physical  Larry Buckley:[id number] DOB: [birth year] DOA: 06/16/2022  Referring physician: Dr. Darnelle Catalan, EDP  PCP: Marjie Skiff, NP  Outpatient Specialists: Psychiatry Patient coming from: Group home.  Chief Complaint: Failure to thrive in adult.   HPI: Larry Buckley is a 81 y.o. male with medical history significant for dementia with behavioral disturbance, hypertension, hyperlipidemia, AAA with repair, gout, who initially presented to [facility] ED after aggressive and combative behavior at the group home.  He was brought into the ED on 06/30/2022 via EMS under IVC.  He has been staying in the ED, seen by psychiatry and social worker, awaiting placement.  Today he was noted to be significantly weak, not standing on his own, with urinary incontinence, and not eating.  A chest x-ray was done which showed low lung volumes with mild bibasilar atelectasis and or infiltrate.  The patient denies having cough.  He is afebrile with no leukocytosis.  UA and Depakote level are pending.  Due to concern for failure to thrive in adult, EDP requested admission.  The patient was admitted by [facility], hospitalist service.  ED Course: Tmax 98.5.  BP 137/55, pulse 66, respiratory 18, O2 saturation 96% on room air.  CBC and BMP essentially unremarkable, except for moderately low albumin, 3.0.  Review of Systems: Review of systems as noted in the HPI. All other systems reviewed and are negative.   Past Medical History:  Diagnosis Date   AAA (abdominal aortic aneurysm) (HCC)    Dementia (HCC) 05/21/2020   Gout, arthropathy    Hyperlipidemia    Hypertension    Past Surgical History:  Procedure Laterality Date   PERIPHERAL VASCULAR CATHETERIZATION N/A 02/08/2016   Procedure: Endovascular Repair/Stent Graft;  Surgeon: Annice Needy, MD;  Location: [facility] INVASIVE CV LAB;  Service: Cardiovascular;  Laterality: N/A;    Social History:  reports that he has quit smoking. He has never used smokeless  tobacco. He reports current alcohol use. He reports that he does not use drugs.   Allergies  Allergen Reactions   Lotensin [Benazepril Hcl] Swelling    Family History  Problem Relation Age of Onset   Cancer Mother    Diabetes Mother    Heart disease Father    Hypertension Father    Heart disease Brother       Prior to Admission medications   Medication Sig Start Date End Date Taking? Authorizing Provider  amLODipine (NORVASC) 5 MG tablet Take 1 tablet (5 mg total) by mouth daily. 04/25/22  Yes Cannady, Corrie Dandy T, NP  aspirin EC 81 MG tablet Take 1 tablet (81 mg total) by mouth daily. Swallow whole. 03/16/22  Yes Cannady, Jolene T, NP  atorvastatin (LIPITOR) 20 MG tablet Take 1 tablet (20 mg total) by mouth daily. 03/16/22  Yes Cannady, Jolene T, NP  divalproex (DEPAKOTE) 500 MG DR tablet Take 500 mg by mouth 2 (two) times daily. 05/22/22  Yes [provider]  Melatonin 10 MG SUBL Place 1 tablet under the tongue at bedtime. 04/25/22  Yes Cannady, Jolene T, NP  mirtazapine (REMERON) 15 MG tablet Take 1 tablet (15 mg total) by mouth at bedtime. 04/25/22  Yes Cannady, Jolene T, NP  risperiDONE (RISPERDAL) 0.5 MG tablet Take 1 tablet (0.5 mg total) by mouth 2 (two) times daily. Patient taking differently: Take 0.75 mg by mouth 2 (two) times daily. 04/03/22  Yes Delton Prairie, MD  Cholecalciferol (VITAMIN D-1000 MAX ST) 25 MCG (1000 UT) tablet Take by mouth. Patient  not taking: Reported on 07/15/2022 06/01/22   [provider]  divalproex (DEPAKOTE) 125 MG DR tablet Take by mouth. Patient not taking: Reported on 06/22/2022 05/17/22 05/17/23  [provider]  LORazepam (ATIVAN) 0.5 MG tablet Take 0.5 mg by mouth 3 (three) times daily as needed. Patient not taking: Reported on 07/11/2022 05/26/22   [provider]    Physical Exam: BP (!) 137/55 (BP Location: Right Arm)   Pulse 66   Temp 98.5 F (36.9 C)   Resp 18   SpO2 96%   General: 81 y.o. year-old male well  developed well nourished in no acute distress.  Somnolent, eyes closed and minimally interactive.  Follows commands. Cardiovascular: Regular rate and rhythm with no rubs or gallops.  No thyromegaly or JVD noted.  1+ pitting edema lower extremities bilaterally.   Respiratory: Very faint rales at bases with no wheezing noted.  Poor inspiratory effort. Abdomen: Soft nontender nondistended with normal bowel sounds x4 quadrants. Muskuloskeletal: No cyanosis, clubbing or edema noted bilaterally Neuro: CN II-XII intact, strength, sensation, reflexes Skin: No ulcerative lesions noted or rashes Psychiatry: Judgement and insight appear altered. Mood is appropriate for condition and setting          Labs on Admission:  Basic Metabolic Panel: Recent Labs  Lab 07/01/22 0318 07/04/22 1840  NA 140 138  K 4.4 4.5  CL 106 103  CO2 28 25  GLUCOSE 102* 106*  BUN 15 14  CREATININE 0.79 0.71  CALCIUM 8.5* 8.8*   Liver Function Tests: Recent Labs  Lab 07/01/22 0318 07/04/22 1840  AST 24 23  ALT 15 16  ALKPHOS 46 52  BILITOT 0.5 0.8  PROT 7.0 7.3  ALBUMIN 2.9* 3.0*   No results for input(s): "LIPASE", "AMYLASE" in the last 168 hours. No results for input(s): "AMMONIA" in the last 168 hours. CBC: Recent Labs  Lab 07/01/22 0318 07/04/22 1840  WBC 5.2 5.7  NEUTROABS 2.7 3.0  HGB 11.5* 12.6*  HCT 35.8* 41.1  MCV 95.7 102.0*  PLT 287 268   Cardiac Enzymes: No results for input(s): "CKTOTAL", "CKMB", "CKMBINDEX", "TROPONINI" in the last 168 hours.  BNP (last 3 results) No results for input(s): "BNP" in the last 8760 hours.  ProBNP (last 3 results) No results for input(s): "PROBNP" in the last 8760 hours.  CBG: No results for input(s): "GLUCAP" in the last 168 hours.  Radiological Exams on Admission: DG Chest 2 View  Result Date: 07/04/2022 CLINICAL DATA:  Altered mental status and weakness EXAM: CHEST - 2 VIEW COMPARISON:  December 29, 2021 FINDINGS: The heart size and mediastinal  contours are within normal limits. Low lung volumes are noted. Mild atelectasis and/or infiltrate is seen within the bilateral lung bases. There is no evidence of a pleural effusion or pneumothorax. Multilevel degenerative changes seen throughout the thoracic spine. IMPRESSION: Low lung volumes with mild bibasilar atelectasis and/or infiltrate. Electronically Signed   By: Virgina Norfolk M.D.   On: 07/04/2022 19:13   CT Head Wo Contrast  Result Date: 07/04/2022 CLINICAL DATA:  Altered mental status EXAM: CT HEAD WITHOUT CONTRAST TECHNIQUE: Contiguous axial images were obtained from the base of the skull through the vertex without intravenous contrast. RADIATION DOSE REDUCTION: This exam was performed according to the departmental dose-optimization program which includes automated exposure control, adjustment of the mA and/or kV according to patient size and/or use of iterative reconstruction technique. COMPARISON:  02/19/2022 FINDINGS: Brain: No acute intracranial findings are seen. There are no signs  of bleeding within the cranium. Minimal calcifications are seen in basal ganglia. Cortical sulci are prominent. There is decreased density in periventricular and subcortical white matter. Vascular: Scattered arterial calcifications are seen. Skull: Unremarkable. Sinuses/Orbits: There is opacification of right maxillary sinus. Old blowout fracture is seen in the floor of left orbit. Other: None. IMPRESSION: No acute intracranial findings are seen in noncontrast CT brain. Atrophy. Small-vessel disease. There is opacification of right maxillary sinus suggesting chronic sinusitis or mucocele. Old blowout fracture is seen in the floor of left orbit. Electronically Signed   By: Ernie Avena M.D.   On: 07/04/2022 19:06    EKG: I independently viewed the EKG done and my findings are as followed: Normal sinus rhythm rate of 68.  Nonspecific ST-T changes.  QTc 378.  Assessment/Plan Present on Admission:   Failure to thrive in adult  Principal Problem:   Dementia with behavioral disturbance (HCC) Active Problems:   Failure to thrive in adult  Failure to thrive in adult Suspect related to progressive dementia Rule out other etiologies Follow UA and Depakote level Encourage oral intake Gentle IV fluid hydration to avoid dehydration Dietitian consult Palliative consult to assist with establishing goals of care  Possible delirium CT head nonacute Delirium precautions Fall and aspiration precautions Out of bed to chair every shift with assistance and fall precautions Reorient as needed Continue psych medications  Moderate protein calorie malnutrition Weight 84 kg Albumin 3.0 Moderate muscle mass loss Dietitian consulted  Chronic bilateral lower extremity edema Suspect from venous insufficiency Elevate lower extremities as able Monitor volume status while on gentle IV fluid hydration NS at 50 cc/h x1 day.  Dementia with behavioral disturbances Continue psych medications Psychiatry consulted by EDP and following One-to-one sitter in place for patient's own safety.  Hypertension Resume home regimen  Chronic anxiety Resume home regimen  Hyperlipidemia Resume home regimen  Ambulatory dysfunction Continue PT OT with assistance and fall precautions    DVT prophylaxis: Subcu Lovenox daily  Code Status: Full code  Family Communication: None at bedside  Disposition Plan: Admitted to telemetry medical unit  Consults called: Psychiatry consulted by EDP, following, palliative care medicine.  Admission status: Observation status   Status is: Observation    Darlin Drop MD [facility] Pager [phone]  If 7PM-7AM, please contact night-coverage [email or web address] Password [facility]  07/04/2022, 9:12 PM

## 2022-07-05 DIAGNOSIS — R630 Anorexia: Secondary | ICD-10-CM

## 2022-07-05 DIAGNOSIS — E440 Moderate protein-calorie malnutrition: Secondary | ICD-10-CM | POA: Diagnosis present

## 2022-07-05 DIAGNOSIS — R627 Adult failure to thrive: Secondary | ICD-10-CM | POA: Diagnosis not present

## 2022-07-05 DIAGNOSIS — F03918 Unspecified dementia, unspecified severity, with other behavioral disturbance: Secondary | ICD-10-CM | POA: Diagnosis not present

## 2022-07-05 LAB — URINALYSIS, ROUTINE W REFLEX MICROSCOPIC
Bacteria, UA: NONE SEEN
Bilirubin Urine: NEGATIVE
Glucose, UA: NEGATIVE mg/dL
Hgb urine dipstick: NEGATIVE
Ketones, ur: NEGATIVE mg/dL
Nitrite: NEGATIVE
Protein, ur: NEGATIVE mg/dL
Specific Gravity, Urine: 1.017 (ref 1.005–1.030)
pH: 7 (ref 5.0–8.0)

## 2022-07-05 LAB — VALPROIC ACID LEVEL: Valproic Acid Lvl: 62 ug/mL (ref 50.0–100.0)

## 2022-07-05 LAB — PROCALCITONIN: Procalcitonin: <0.1 ng/mL

## 2022-07-05 MED ORDER — MEGESTROL ACETATE 400 MG/10ML PO SUSP
400.0000 mg | Freq: Every day | ORAL | Status: DC
  Administered 2022-07-06 – 2022-08-08 (×32): 400 mg via ORAL
  Filled 2022-07-05 (×35): qty 10

## 2022-07-05 NOTE — ED Notes (Signed)
Pt given dinner tray and drink at this time. 

## 2022-07-05 NOTE — Progress Notes (Signed)
Physical Therapy Treatment Patient Details Name: Larry Buckley MRN: [id number] DOB: Mar 06, 1941 Today's Date: 07/05/2022   History of Present Illness Pt admitted for dementia iwth behavior disturbance. Pt from family care home and is poor historian.    PT Comments    Pt received in bed and nurse present in the room. Pt confused and only oriented to self. Ppt follows one step commands with time. Pt performed Supine to tsit with max assist  2/./2 poor cognitive status. STS with Mod of 2 ans stood for 4 mins while pt received hygiene care. Pt stood with CGA of 1. Pt needs assistance due to poor cognition and problem solving and initiation of activity.  Pt will benefit form HHPT and HH aide with 24 hrs sup after acute care.   Recommendations for follow up therapy are one component of a multi-disciplinary discharge planning process, led by the attending physician.  Recommendations may be updated based on patient status, additional functional criteria and insurance authorization.  Follow Up Recommendations  Home health PT (with 24 hour care)     Assistance Recommended at Discharge Frequent or constant Supervision/Assistance  Patient can return home with the following A little help with walking and/or transfers;A lot of help with bathing/dressing/bathroom;Assistance with cooking/housework;Assistance with feeding;Direct supervision/assist for medications management;Direct supervision/assist for financial management;Assist for transportation;Help with stairs or ramp for entrance   Equipment Recommendations  Rolling walker (2 wheels);BSC/3in1    Recommendations for Other Services       Precautions / Restrictions Precautions Precautions: Fall Restrictions Weight Bearing Restrictions: No     Mobility  Bed Mobility Overal bed mobility: Needs Assistance Bed Mobility: Supine to Sit     Supine to sit: Max assist          Transfers Overall transfer level: Needs assistance Equipment  used: 2 person hand held assist Transfers: Sit to/from Stand Sit to Stand: Mod assist           General transfer comment: stood with min assist of 2    Ambulation/Gait                   Stairs             Wheelchair Mobility    Modified Rankin (Stroke Patients Only)       Balance Overall balance assessment: Needs assistance Sitting-balance support: Feet unsupported, No upper extremity supported Sitting balance-Leahy Scale: Good       Standing balance-Leahy Scale: Fair Standing balance comment: with Hand held assistance.                            Cognition Arousal/Alertness: Lethargic Behavior During Therapy: Restless Overall Cognitive Status: History of cognitive impairments - at baseline                                          Exercises      General Comments        Pertinent Vitals/Pain      Home Living                          Prior Function            PT Goals (current goals can now be found in the care plan section) Acute Rehab PT Goals Patient Stated Goal: unable to  state PT Goal Formulation: Patient unable to participate in goal setting Progress towards PT goals: Progressing toward goals    Frequency    Min 2X/week      PT Plan Current plan remains appropriate    Co-evaluation              AM-PAC PT "6 Clicks" Mobility   Outcome Measure  Help needed turning from your back to your side while in a flat bed without using bedrails?: None Help needed moving from lying on your back to sitting on the side of a flat bed without using bedrails?: A Little Help needed moving to and from a bed to a chair (including a wheelchair)?: A Little Help needed standing up from a chair using your arms (e.g., wheelchair or bedside chair)?: A Lot Help needed to walk in hospital room?: A Little Help needed climbing 3-5 steps with a railing? : A Lot 6 Click Score: 17    End of Session  Equipment Utilized During Treatment: Gait belt Activity Tolerance: Patient tolerated treatment well Patient left: in bed;with nursing/sitter in room Nurse Communication: Mobility status PT Visit Diagnosis: Unsteadiness on feet (R26.81);Muscle weakness (generalized) (M62.81);Difficulty in walking, not elsewhere classified (R26.2)     Time: 1761-6073 PT Time Calculation (min) (ACUTE ONLY): 10 min  Charges:  $Therapeutic Activity: 8-22 mins                     Joaquin Music PT DPT 5:37 PM,07/05/22

## 2022-07-05 NOTE — ED Notes (Signed)
Patient sleeping. Awakens to verbal stimuli. Able to state name and DOB but very drowsy. Patient following commands. Disoriented to place, time and situation. Incontinent of urine in brief. Pericare performed, brief changed and external male catheter applied. Bed in low position with bed alarm on and siderails up.

## 2022-07-05 NOTE — ED Notes (Signed)
Patient repositioned in bed. Bed in low position with siderails up and bed alarm on.

## 2022-07-05 NOTE — Progress Notes (Signed)
  Progress Note   Patient: Larry Buckley RCV:[id number] DOB: [birth year] DOA: 07/02/2022     0 DOS: the patient was seen and examined on 07/05/2022   Brief hospital course: AZRAEL MADDIX is a 81 y.o. male with medical history significant for dementia with behavioral disturbance, hypertension, hyperlipidemia, AAA with repair, gout, who initially presented to [facility] ED after aggressive and combative behavior at the group home.  He was brought into the ED on 06/19/2022 via EMS under IVC.  He has been staying in the ED, seen by psychiatry and social worker, awaiting placement.  Admitted to hospitalist service on 9/19 as patient has increased weakness.  Assessment and Plan: Dementia with behavioral disturbances and delirium. Failure to thrive. Anorexia. Moderate protein calorie malnutrition. Patient no longer has any agitation, appear to have a severe dementia.  Only oriented to himself. He has not been eating well, add megestrol. Currently looking for nursing home placement. Patient has poor prognosis, obtain palliative care consult.  Essential hypertension. Continue home medicines   Severe debility. PT/OT.     Subjective:  Patient has a poor appetite, significant weakness.  Profoundly confused.  Physical Exam: Vitals:   07/05/22 0058 07/05/22 0500 07/05/22 0853 07/05/22 1326  BP: (!) 123/47 (!) 164/66 (!) 146/60 (!) 154/62  Pulse: 61 61 60 63  Resp: 14 16 14 16   Temp: 98.6 F (37 C)  98.5 F (36.9 C) 97.8 F (36.6 C)  TempSrc: Oral  Oral Oral  SpO2: 99% 100% 100% 100%  Weight:       General exam: Appears calm and comfortable  Respiratory system: Clear to auscultation. Respiratory effort normal. Cardiovascular system: S1 & S2 heard, RRR. No JVD, murmurs, rubs, gallops or clicks.  Gastrointestinal system: Abdomen is nondistended, soft and nontender. No organomegaly or masses felt. Normal bowel sounds heard. Central nervous system: Alert and oriented x1. No focal neurological  deficits. Extremities: 1+ leg edema Skin: No rashes, lesions or ulcers    Data Reviewed:  Lab results reviewed.  Family Communication:   Disposition: Status is: Observation   Planned Discharge Destination:  TBD    Time spent: 35 minutes  Author: Sharen Hones, MD 07/05/2022 2:54 PM  For on call review [email or web address].

## 2022-07-05 NOTE — ED Notes (Signed)
VOL/Pending Medical Admit

## 2022-07-05 NOTE — ED Notes (Signed)
Pt repositioned in bed to eat breakfast. Food tray set up for ease. Pt calm.

## 2022-07-05 NOTE — Hospital Course (Addendum)
Larry Buckley is a 81 y.o. male with medical history significant for dementia with behavioral disturbance, hypertension, hyperlipidemia, AAA with repair, gout, who initially presented to [facility] ED after aggressive and combative behavior at the group home.  He was brought into the ED on 07/06/2022 via EMS under IVC.  He has been staying in the ED, seen by psychiatry and social worker, awaiting placement.  Admitted to hospitalist service on 9/19 as patient has increased weakness. Patient has significant agitation, barely eating.  Perative care consult obtained, currently working with DSS for goal of care.   9/27: Was agitated earlier required Geodon.  9/28: TOC working on placement 9/29: Required one-time Geodon this afternoon for agitation.  Psychiatry following and increased his dose of Risperdal and Seroquel today 9/30: Per nursing note patient was combative, agitated and attempting to get out of the bed last night - sleepy this morning 10/1: Urology consulted for hematuria and change of Foley catheter 10/2-10/3: Waiting for placement. 10/4: Patient remained stable.  Waiting for placement.  Patient need LTC placement.  DSS is involved. Palliative care will follow-up as an outpatient. 10/5: Remains stable.  Still waiting for placement. 10/6: No change today.No news on placement. 10/8: Remains stable.  Still waiting for placement 10/11: Patient remained stable.  [place] evaluated him, if we can remove Foley catheter.  On chart review could not find a reason for Foley catheter except urology note on 10/1 stating that keep Foley for 1 week for concern of falls posterior passage created during an attempt to replace Foley catheter when he accidentally pulled it out causing hematuria.  It has been more than 1 week, we will remove Foley and give him a voiding trial. Also added Flomax.  10/13: Foley was removed and patient is voiding well.  We will check postvoid bladder scan to rule out any abnormal  retention.  11/22.  Had episode of UTI, completed antibiotics.  12/3-15:  Medically stable, Still pending placement.

## 2022-07-05 NOTE — ED Notes (Signed)
Informed RN bed assigned 

## 2022-07-05 NOTE — ED Notes (Signed)
Patient very anxious. Frequently attempting to get out of bed. Patient getting agitated with staff that are attempting to reorient patient. Patient continues to attempt to climb out of bed.

## 2022-07-06 ENCOUNTER — Observation Stay: Payer: Medicare PPO

## 2022-07-06 DIAGNOSIS — E440 Moderate protein-calorie malnutrition: Secondary | ICD-10-CM | POA: Diagnosis not present

## 2022-07-06 DIAGNOSIS — R627 Adult failure to thrive: Secondary | ICD-10-CM | POA: Diagnosis not present

## 2022-07-06 DIAGNOSIS — Z7189 Other specified counseling: Secondary | ICD-10-CM

## 2022-07-06 DIAGNOSIS — M1612 Unilateral primary osteoarthritis, left hip: Secondary | ICD-10-CM | POA: Diagnosis not present

## 2022-07-06 DIAGNOSIS — M7989 Other specified soft tissue disorders: Secondary | ICD-10-CM | POA: Diagnosis not present

## 2022-07-06 DIAGNOSIS — F03918 Unspecified dementia, unspecified severity, with other behavioral disturbance: Secondary | ICD-10-CM | POA: Diagnosis not present

## 2022-07-06 MED ORDER — ORAL CARE MOUTH RINSE: 15.0000 mL | OROMUCOSAL | Status: DC | PRN

## 2022-07-06 MED ORDER — ADULT MULTIVITAMIN W/MINERALS CH
1.0000 | ORAL_TABLET | Freq: Every day | ORAL | Status: DC
  Administered 2022-07-08 – 2022-10-02 (×85): 1 via ORAL
  Filled 2022-07-06 (×86): qty 1

## 2022-07-06 MED ORDER — ENSURE ENLIVE PO LIQD
237.0000 mL | Freq: Three times a day (TID) | ORAL | Status: DC
  Administered 2022-07-08 – 2022-10-02 (×218): 237 mL via ORAL

## 2022-07-06 NOTE — Progress Notes (Signed)
Initial Nutrition Assessment  DOCUMENTATION CODES:   Severe malnutrition in context of social or environmental circumstances  INTERVENTION:   Ensure Enlive po TID, each supplement provides 350 kcal and 20 grams of protein.  Magic cup TID with meals, each supplement provides 290 kcal and 9 grams of protein  MVI po daily   Dysphagia 3 diet   Pt at high refeed risk; recommend monitor potassium, magnesium and phosphorus labs daily until stable  NUTRITION DIAGNOSIS:   Severe Malnutrition related to social / environmental circumstances as evidenced by severe fat depletion, severe muscle depletion, 8 percent weight loss in 2 months.  GOAL:   Patient will meet greater than or equal to 90% of their needs  MONITOR:   PO intake, Supplement acceptance, Labs, Weight trends, Skin, I & O's  REASON FOR ASSESSMENT:   Consult Assessment of nutrition requirement/status  ASSESSMENT:   81 y.o. male with medical history significant for dementia with behavioral disturbance, hypertension, hyperlipidemia, AAA with repair and gout who is admitted after aggressive and combative behavior at the group home.  Visited pt's room today. Pt unable to provide any nutrition related history r/t dementia. Pt with poor appetite and oral intake in hospital; pt eating only sips and bites. Pt initiated on remeron and megace. Per chart, pt is down 17lbs(8%) over the past 2 months; this is significant weight loss. RD suspects pt with decreased oral intake at baseline. RD will add supplements and MVI to help pt meet his estimated needs. RD will also liberalize pt's diet. Pt is at high refeed risk. Palliative care consult pending.   Medications reviewed and include: aspirin, lovenox, megace, melatonin, remeron  Labs reviewed: K 4.5 wnl  NUTRITION - FOCUSED PHYSICAL EXAM:  Flowsheet Row Most Recent Value  Orbital Region No depletion  Upper Arm Region Severe depletion  Thoracic and Lumbar Region Moderate depletion   Buccal Region No depletion  Temple Region Moderate depletion  Clavicle Bone Region Severe depletion  Clavicle and Acromion Bone Region Severe depletion  Scapular Bone Region Moderate depletion  Dorsal Hand Moderate depletion  Patellar Region Moderate depletion  Anterior Thigh Region Moderate depletion  Posterior Calf Region Moderate depletion  Edema (RD Assessment) None  Hair Reviewed  Eyes Reviewed  Mouth Reviewed  Skin Reviewed  Nails Reviewed   Diet Order:   Diet Order             Diet Heart Room service appropriate? Yes; Fluid consistency: Thin  Diet effective now                  EDUCATION NEEDS:   No education needs have been identified at this time  Skin:  Skin Assessment: Reviewed RN Assessment  Last BM:  9/20  Height:   Ht Readings from Last 1 Encounters:  06/08/22 6\' 2"  (1.88 m)    Weight:   Wt Readings from Last 1 Encounters:  07/06/22 82 kg    Ideal Body Weight:  86.3 kg  BMI:  Body mass index is 23.21 kg/m.  Estimated Nutritional Needs:   Kcal:  2100-2400kcal/day  Protein:  105-120g/day  Fluid:  2.1-2.4L/day  Koleen Distance MS, RD, LDN Please refer to [facility] for RD and/or RD on-call/weekend/after hours pager

## 2022-07-06 NOTE — Progress Notes (Signed)
Physical Therapy Treatment Patient Details Name: Larry Buckley MRN: [id number] DOB: [birth year] Today's Date: 07/06/2022   History of Present Illness Pt admitted for dementia iwth behavior disturbance. Pt from family care home and is poor historian.    PT Comments    Pt received in chair with flat affect but comfortable. Pt followed simple one step commands with time to process. Pt stood up with VC for hand placement and cues for safety. Pt performed STS with min assist. Ambulated with RW with CGA /min guard of 1 and one person followed with chair for safety. Pt is able to recall his gait patterns with initiation. Pt will benefit from continued PT and Mobility tec interventions to return to [facility] care home with 24 hour assistance.   Recommendations for follow up therapy are one component of a multi-disciplinary discharge planning process, led by the attending physician.  Recommendations may be updated based on patient status, additional functional criteria and insurance authorization.  Follow Up Recommendations  Home health PT/ Family care home.      Assistance Recommended at Discharge Frequent or constant Supervision/Assistance  Patient can return home with the following A little help with walking and/or transfers;A lot of help with bathing/dressing/bathroom;Assistance with cooking/housework;Assistance with feeding;Direct supervision/assist for medications management;Direct supervision/assist for financial management;Assist for transportation;Help with stairs or ramp for entrance   Equipment Recommendations  Rolling walker (2 wheels);BSC/3in1    Recommendations for Other Services       Precautions / Restrictions Precautions Precautions: Fall Restrictions Weight Bearing Restrictions: No     Mobility  Bed Mobility Overal bed mobility: Needs Assistance             General bed mobility comments: received in chair    Transfers Overall transfer level: Needs  assistance Equipment used: Rolling walker (2 wheels) Transfers: Sit to/from Stand Sit to Stand: Min assist           General transfer comment: VC to initiate, and for hand and feet placement.    Ambulation/Gait Ambulation/Gait assistance: Min guard, +2 safety/equipment Gait Distance (Feet): 80 Feet Assistive device: Rolling walker (2 wheels) Gait Pattern/deviations: Step-through pattern, Decreased step length - right, Decreased step length - left, Decreased stride length Gait velocity: decreased     General Gait Details: slow shuffling gait with cues for proper walker use   Stairs             Wheelchair Mobility    Modified Rankin (Stroke Patients Only)       Balance Overall balance assessment: Needs assistance Sitting-balance support: Feet supported Sitting balance-Leahy Scale: Good     Standing balance support: Bilateral upper extremity supported Standing balance-Leahy Scale: Fair (2//2 cognitive impairment) Standing balance comment: with RW                            Cognition Arousal/Alertness: Awake/alert (confused.) Behavior During Therapy: Flat affect Overall Cognitive Status: History of cognitive impairments - at baseline                                          Exercises      General Comments        Pertinent Vitals/Pain      Home Living  Prior Function            PT Goals (current goals can now be found in the care plan section) Acute Rehab PT Goals Patient Stated Goal: unable to state PT Goal Formulation: Patient unable to participate in goal setting Progress towards PT goals: Progressing toward goals    Frequency    Min 2X/week      PT Plan Current plan remains appropriate    Co-evaluation              AM-PAC PT "6 Clicks" Mobility   Outcome Measure  Help needed turning from your back to your side while in a flat bed without using bedrails?: A  Little Help needed moving from lying on your back to sitting on the side of a flat bed without using bedrails?: A Little Help needed moving to and from a bed to a chair (including a wheelchair)?: A Little Help needed standing up from a chair using your arms (e.g., wheelchair or bedside chair)?: A Little Help needed to walk in hospital room?: A Little Help needed climbing 3-5 steps with a railing? : Total 6 Click Score: 16    End of Session         PT Visit Diagnosis: Unsteadiness on feet (R26.81);Muscle weakness (generalized) (M62.81);Difficulty in walking, not elsewhere classified (R26.2)     Time: 3818-2993 PT Time Calculation (min) (ACUTE ONLY): 14 min  Charges:  $Gait Training: 8-22 mins                    Heron Pitcock PT DPT 1:33 PM,07/06/22

## 2022-07-06 NOTE — Plan of Care (Signed)
Full note to follow. Spoke with DSS. Will call back tomorrow for further conversation and [place]. Will look at medications for symptom management.

## 2022-07-06 NOTE — Progress Notes (Signed)
Mobility Specialist - Progress Note   07/06/22 1200  Mobility  Activity Transferred from bed to chair;Ambulated with assistance in room  Level of Assistance Minimal assist, patient does 75% or more  Assistive Device Front wheel walker  Distance Ambulated (ft) 4 ft  Activity Response Tolerated well  $Mobility charge 1 Mobility     Pt sleeping on arrival, utilizing RA. Awakened by voice and provided cold cloth for increased arousal. Pt sat EOB with maxA. Restricted in [place]. Good sitting balance. Stood with minA with extra time needed for anterior weight shifting. VC for hand placement. Pt ambulated to recliner with minA and cueing for sequencing steps. Follows single-step commands fairly well with increased time. Noted large lump on L hip, RN notified and entered to examine. Bed linen changed d/t soiled sheets. Pt left in recliner with alarm set, needs in reach.    Kathee Delton Mobility Specialist 07/06/22, 12:09 PM

## 2022-07-06 NOTE — Progress Notes (Signed)
Mobility Specialist - Progress Note    07/06/22 1400  Mobility  Activity Transferred from chair to bed  Level of Assistance +2 (takes two people)  Assistive Device None  Distance Ambulated (ft) 4 ft  Activity Response Tolerated well  $Mobility charge 1 Mobility   Candie Mile Mobility Specialist 07/06/22 2:49 PM

## 2022-07-06 NOTE — Progress Notes (Signed)
  Progress Note   Patient: Larry Buckley DOB: 1941/08/30 DOA: 06/26/2022     0 DOS: the patient was seen and examined on 07/06/2022   Brief hospital course: Larry Buckley is a 81 y.o. male with medical history significant for dementia with behavioral disturbance, hypertension, hyperlipidemia, AAA with repair, gout, who initially presented to [facility] ED after aggressive and combative behavior at the group home.  He was brought into the ED on 06/19/2022 via EMS under IVC.  He has been staying in the ED, seen by psychiatry and social worker, awaiting placement.  Admitted to hospitalist service on 9/19 as patient has increased weakness.  Assessment and Plan: Dementia with behavioral disturbances and delirium. Failure to thrive. Anorexia. Moderate protein calorie malnutrition. Spoke with the nurse, patient eating better.  Continue to follow.  Pending placement.  Swelling in the left hip area. Obtain x-ray.   Essential hypertension. Continue home medicines    Severe debility. PT/OT.        Subjective:  Patient is confused which is baseline.  Physical Exam: Vitals:   07/05/22 1738 07/05/22 1907 07/06/22 0359 07/06/22 0750  BP: (!) 142/108 (!) 160/80 (!) 163/75 (!) 165/83  Pulse: 61 85 (!) 56 (!) 57  Resp: 20 18 20 18   Temp: (!) 97.4 F (36.3 C) (!) 97.5 F (36.4 C) (!) 97 F (36.1 C)   TempSrc:   Axillary Oral  SpO2: 99% 100% 100% 100%  Weight:       General exam: Appears calm and comfortable  Respiratory system: Clear to auscultation. Respiratory effort normal. Cardiovascular system: S1 & S2 heard, RRR. No JVD, murmurs, rubs, gallops or clicks. No pedal edema. Gastrointestinal system: Abdomen is nondistended, soft and nontender. No organomegaly or masses felt. Normal bowel sounds heard. Central nervous system: Alert and oriented x1. No focal neurological deficits. Extremities: left hip swelling, non tender Skin: No rashes, lesions or ulcers   Data  Reviewed:  There are no new results to review at this time.  Family Communication:   Disposition: Status is: Observation   Planned Discharge Destination:  Nursing home    Time spent: 29 minutes  Author: Sharen Hones, MD 07/06/2022 2:53 PM  For on call review [email or web address].

## 2022-07-06 NOTE — TOC Progression Note (Addendum)
Transition of Care [facility]) - Progression Note    Patient Details  Name: Larry Buckley MRN: [id number] Date of Birth: 1941-09-28  [facility] of [facility]) CM/SW Contact  Beverly Sessions, RN Phone Number: 07/06/2022, 2:22 PM  Clinical Narrative:    Message left for [facility] [place] for update.  Also need to discuss potential referral to care patrol, and review [facility] notice  PT currently recommendation home health PT/ Family care home   Expected Discharge Plan: Memory Care Barriers to Discharge: Other (must enter comment) (Facility will not accept patient back)  Expected Discharge Plan and Services Expected Discharge Plan: Memory Care   Discharge Planning Services: CM Consult   Living arrangements for the past 2 months: Group Home                 DME Arranged: N/A DME Agency: NA       HH Arranged: NA HH Agency: NA         Social Determinants of Health (SDOH) Interventions    Readmission Risk Interventions     No data to display

## 2022-07-07 DIAGNOSIS — R627 Adult failure to thrive: Secondary | ICD-10-CM | POA: Diagnosis not present

## 2022-07-07 DIAGNOSIS — F03918 Unspecified dementia, unspecified severity, with other behavioral disturbance: Secondary | ICD-10-CM | POA: Diagnosis not present

## 2022-07-07 DIAGNOSIS — Z7189 Other specified counseling: Secondary | ICD-10-CM | POA: Diagnosis not present

## 2022-07-07 DIAGNOSIS — E43 Unspecified severe protein-calorie malnutrition: Secondary | ICD-10-CM | POA: Diagnosis not present

## 2022-07-07 MED ORDER — QUETIAPINE FUMARATE 25 MG PO TABS
50.0000 mg | ORAL_TABLET | Freq: Every day | ORAL | Status: DC
  Administered 2022-07-07 – 2022-07-13 (×6): 50 mg via ORAL
  Filled 2022-07-07 (×7): qty 2

## 2022-07-07 NOTE — TOC Progression Note (Signed)
Transition of Care [facility]) - Progression Note    Patient Details  Name: EVERET FLAGG MRN: [id number] Date of Birth: 1940/11/10  Transition of Care [facility]) CM/SW Contact  Beverly Sessions, RN Phone Number: 07/07/2022, 5:04 PM  Clinical Narrative:     Notified by Velna Hatchet at [facility] that patient is active with PT  Expected Discharge Plan: Memory Care Barriers to Discharge: Other (must enter comment) (Facility will not accept patient back)  Expected Discharge Plan and Services Expected Discharge Plan: Memory Care   Discharge Planning Services: CM Consult   Living arrangements for the past 2 months: Group Home                 DME Arranged: N/A DME Agency: NA       HH Arranged: NA HH Agency: NA         Social Determinants of Health (SDOH) Interventions    Readmission Risk Interventions     No data to display

## 2022-07-07 NOTE — Progress Notes (Addendum)
  Progress Note   Patient: Larry Buckley [id number]:[id number] DOB: 1940-12-21 DOA: 07/27/2022     0 DOS: the patient was seen and examined on 07/07/2022   Brief hospital course: Larry Buckley is a 81 y.o. male with medical history significant for dementia with behavioral disturbance, hypertension, hyperlipidemia, AAA with repair, gout, who initially presented to [facility] ED after aggressive and combative behavior at the group home.  He was brought into the ED on 07-27-22 via EMS under IVC.  He has been staying in the ED, seen by psychiatry and social worker, awaiting placement.  Admitted to hospitalist service on 9/19 as patient has increased weakness.  Assessment and Plan: Dementia with behavioral disturbances and delirium. Failure to thrive. Anorexia. Severe protein calorie malnutrition. Discussed with the nurse, patient sleeps most of the time, he was eating, but appetite has been poor.  He has severe muscle atrophy, severe protein malnutrition. Patient could not sleep at nighttime, he was agitated and combative last night.  Added Seroquel nightly. Long-term prognosis is poor.  Appreciate palliative consult, will need to talk to legal guardian for Larry Buckley.  Central sleep apnea. When I walked in the room earlier this morning, patient has a prolonged apnea without attempt to breathe.    Swelling in the left hip area. Hip osteoarthritis. X-ray only showed osteoarthritis, without acute changes.  Patient does not have pain in the hip.   Essential hypertension. Continue home medicines    Severe debility. PT/OT.  Prognosis. Patient has significant dementia, failure to thrive, severe protein cal malnutrition.  His sleeps most of the time, he has severe central sleep apnea.  Continue discussion about goals of care with legal guardian with assist from palliative care.     Subjective:  Patient sleeps most of the time.  He had prolonged episode of apnea during sleep. He was eating per  RN.  Physical Exam: Vitals:   07/06/22 1655 07/06/22 2049 07/07/22 0457 07/07/22 0917  BP: (!) 168/75 (!) 145/71 131/68 (!) 158/78  Pulse: 60 80 (!) 57 (!) 52  Resp: 18 20 (!) 8 19  Temp: 97.9 F (36.6 C) 98.5 F (36.9 C) 98.4 F (36.9 C)   TempSrc:  Oral Oral   SpO2: 100% 100% 100% 100%  Weight:       General exam: Appears calm and comfortable  Respiratory system: Clear to auscultation. Respiratory effort normal. Cardiovascular system: S1 & S2 heard, RRR. No JVD, murmurs, rubs, gallops or clicks. No pedal edema. Gastrointestinal system: Abdomen is nondistended, soft and nontender. No organomegaly or masses felt. Normal bowel sounds heard. Central nervous system: He has a prolonged apnea while asleep, confused when I woke him up. Extremities: Severe muscle atrophy. Skin: No rashes, lesions or ulcers   Data Reviewed:  There are no new results to review at this time.  Family Communication: Not able to reach legal guardian.  Disposition: Status is: Observation   Planned Discharge Destination:  SNF    Time spent: 35 minutes  Author: Sharen Hones, MD 07/07/2022 12:27 PM  For on call review [email or web address].

## 2022-07-07 NOTE — Progress Notes (Addendum)
Daily Progress Note   Patient Name: Larry Buckley       Date: 07/07/2022 DOB: 1941-03-09  Age: 81 y.o. MRN#: [id number] Attending Physician: Sharen Hones, MD Primary Care Physician: Venita Lick, NP Admit Date: 06/23/2022  Reason for Consultation/Follow-up: Establishing goals of care  Subjective: Notes and labs reviewed.  Patient is sleeping soundly this morning.  Per nursing staff patient was awake all night and did not fall asleep until 6 AM this morning.  Discussed his symptoms and medications with pharmacy.  Discussed thoughts on increasing evening dose of Risperdal or initiating Zyprexa.  Per conversation with pharmacy, they will speak to the attending and consider talking with psychiatry regarding evaluation of his medications.  A MOST form was completed for DSS to pick up and review as discussed with DSS yesterday.  It is completed for no CPR, comfort measures, antibiotics to be determined if needed, no IV fluids and no feeding tube.  Patient does not currently appear to be a candidate for hospice at this time. This may change with stopping Megace for appetite stimulation.  Could continue to treat the treatable outpatient and help with his quality of life and symptoms, with no artificial life prolongation.  A detailed VM was left for DSS Marrow on her personal DSS VM. Attending was updated.   Length of Stay: 0  Current Medications: Scheduled Meds:   amLODipine  5 mg Oral Daily   aspirin EC  81 mg Oral Daily   atorvastatin  20 mg Oral QHS   divalproex  500 mg Oral BID   enoxaparin (LOVENOX) injection  40 mg Subcutaneous Q24H   feeding supplement  237 mL Oral TID BM   megestrol  400 mg Oral Daily   melatonin  10 mg Oral QHS   mirtazapine  15 mg Oral QHS   multivitamin with  minerals  1 tablet Oral Daily   risperiDONE  0.75 mg Oral BID    Continuous Infusions:   PRN Meds: acetaminophen, LORazepam, ondansetron (ZOFRAN) IV, mouth rinse, polyethylene glycol  Physical Exam Constitutional:      Comments: Eyes closed  Pulmonary:     Effort: Pulmonary effort is normal.             Vital Signs: BP (!) 158/78 (BP Location: Left Arm)   Pulse (!) 52  Temp 98.4 F (36.9 C) (Oral)   Resp 19   Wt 82 kg   SpO2 100%   BMI 23.21 kg/m  SpO2: SpO2: 100 % O2 Device: O2 Device: Room Air O2 Flow Rate:    Intake/output summary: No intake or output data in the 24 hours ending 07/07/22 1247 LBM: Last BM Date : 07/05/22 Baseline Weight: Weight: 84 kg (historic weight. Please update during this admission.) Most recent weight: Weight: 82 kg     Patient Active Problem List   Diagnosis Date Noted   Protein-calorie malnutrition, severe 07/07/2022   Anorexia 07/05/2022   Protein-calorie malnutrition, moderate (HCC) 07/05/2022   Failure to thrive in adult 07/04/2022   Acute pain of right shoulder 06/08/2022   Acute hip pain, left 06/08/2022   Low hemoglobin 04/26/2022   Edema of both legs 01/27/2022   Prediabetes 01/04/2022   Dementia with behavioral disturbance (HCC) 12/29/2021   History of abdominal aortic aneurysm (AAA) repair 09/25/2021   B12 deficiency 07/11/2021   Hyperlipidemia, mixed    Hypertension    Gout, arthropathy     Palliative Care Assessment & Plan   Recommendations/Plan:  Per staff patient is up all night and sleeping all day.  Per staff patient fell asleep this morning at 6 AM.  Would recommend increasing evening dose of Risperdal, or initiating Zyprexa.  Could speak to psychiatry regarding their recommendations for medication management.  MOST form has been completed for DSS to pick up regarding care moving forward.  Patient is not currently hospice appropriate, but it would be reasonable to treat the treatable outpatient and help with  his quality of life and symptoms without further life-prolonging care such as CPR or appetite stimulants.    Code Status:    Code Status Orders  (From admission, onward)           Start     Ordered   07/04/22 2111  Full code  Continuous        07/04/22 2110           Code Status History     Date Active Date Inactive Code Status Order ID Comments User Context   07/04/2022 1337 07/04/2022 2110 Full Code [id number]  Minna Antis, MD ED   05/25/2022 2232 05/26/2022 0836 Full Code [id number]  Wynonia Sours, RN ED   03/30/2022 1406 04/03/2022 1950 Full Code [id number]  Minna Antis, MD ED   12/29/2021 0041 01/11/2022 1936 Full Code [id number]  Andris Baumann, MD ED      Care plan was discussed with attending and pharmacy and nurse  Thank you for allowing the Palliative Medicine Team to assist in the care of this patient.    Morton Stall, NP  Please contact Palliative Medicine Team phone at [phone] for questions and concerns.

## 2022-07-07 NOTE — Progress Notes (Signed)
Mobility Specialist - Progress Note   07/07/22 1400  Mobility  Activity Ambulated with assistance in hallway  Level of Assistance Minimal assist, patient does 75% or more  Assistive Device Front wheel walker  Distance Ambulated (ft) 50 ft  Activity Response Tolerated well  $Mobility charge 1 Mobility     Pt attempting to get OOB on arrival, utilizing RA. Confused. Pt completed bed mobility modI and STS with modA. Pt a little inappropriate this date, slaps author on the thigh and attempts to kiss her as she places gait belt around pt waist. RN made aware. VC and extra time needed for anterior weight shifting and posture before progressing gait. Verbal/tactile cueing for maintaining hand placement throughout session. Ambulated hallway with minA for navigating RW as pt just follows behind RW and stops when not assisted. Pt left in chair with alarm set, needs in reach.    Kathee Delton Mobility Specialist 07/07/22, 2:14 PM

## 2022-07-07 NOTE — Progress Notes (Signed)
Mobility Specialist - Progress Note   07/07/22 1500  Mobility  Activity Ambulated with assistance in room;Transferred from chair to bed  Level of Assistance Moderate assist, patient does 50-74%  Assistive Device Front wheel walker  Activity Response Tolerated well  $Mobility charge 1 Mobility     Pt returned chair-bed with minA STS and modA for sequencing steps/turn with RW. Extra time needed to complete transfer. Pt left in bed with alarm set, needs in reach.    Kathee Delton Mobility Specialist 07/07/22, 3:53 PM

## 2022-07-07 NOTE — Consult Note (Signed)
Consultation Note Date: 07/07/2022   Patient Name: Larry Buckley  DOB: [birth year]  MRN: [id number]  Age / Sex: 81 y.o., male  PCP: Marjie Skiff, NP Referring Physician: Marrion Coy, MD  Reason for Consultation: Establishing goals of care  HPI/Patient Profile: Larry Buckley is a 81 y.o. male with medical history significant for dementia with behavioral disturbance, hypertension, hyperlipidemia, AAA with repair, gout, who initially presented to [facility] ED after aggressive and combative behavior at the group home.  He was brought into the ED on 07/06/2022 via EMS under IVC.  He has been staying in the ED, seen by psychiatry and social worker, awaiting placement.  Today he was noted to be significantly weak, not standing on his own, with urinary incontinence, and not eating.  A chest x-ray was done which showed low lung volumes with mild bibasilar atelectasis and or infiltrate.  The patient denies having cough.  He is afebrile with no leukocytosis.  UA and Depakote level are pending.  Due to concern for failure to thrive in adult, EDP requested admission.  The patient was admitted by [facility], hospitalist service.  Clinical Assessment and Goals of Care: Notes and labs reviewed. Patient is unable to complete a GOC conversation. Spoke with DSS worker Marrow. She discusses his ongoing behavioral and placement challenges.   We discussed his diagnoses. She discusses his sundowning. Discussed medication management. Discussed GOC. Discussed that patient does not appear to be hospice appropriate. Discussed symptom management. Discussed stopping life prolonging measures such as appetite stimulation.  Discussed a DNR status.  Discussed that I would speak with staff members and primary team to try to come up with a plan moving forward.  SUMMARY OF RECOMMENDATIONS   --Patient will need medication adjustment due to his behavior and  sundowning.  Would recommend reaching out to psychiatry for this.  --We will complete a MOST form for DSS to pick up regarding CODE STATUS and care moving forward.       Primary Diagnoses: Present on Admission:  Failure to thrive in adult   I have reviewed the medical record, interviewed the patient and family, and examined the patient. The following aspects are pertinent.  Past Medical History:  Diagnosis Date   AAA (abdominal aortic aneurysm) (HCC)    Dementia (HCC) 05/21/2020   Gout, arthropathy    Hyperlipidemia    Hypertension    Social History   Socioeconomic History   Marital status: Widowed    Spouse name: Not on file   Number of children: Not on file   Years of education: Not on file   Highest education level: Not on file  Occupational History   Not on file  Tobacco Use   Smoking status: Former   Smokeless tobacco: Never  Substance and Sexual Activity   Alcohol use: Yes   Drug use: No   Sexual activity: Not Currently  Other Topics Concern   Not on file  Social History Narrative   Not on file   Social Determinants of Health  Financial Resource Strain: Low Risk  (05/09/2022)   Overall Financial Resource Strain (CARDIA)    Difficulty of Paying Living Expenses: Not hard at all  Food Insecurity: No Food Insecurity (05/09/2022)   Hunger Vital Sign    Worried About Running Out of Food in the Last Year: Never true    Ran Out of Food in the Last Year: Never true  Transportation Needs: No Transportation Needs (05/09/2022)   PRAPARE - Hydrologist (Medical): No    Lack of Transportation (Non-Medical): No  Physical Activity: Inactive (05/09/2022)   Exercise Vital Sign    Days of Exercise per Week: 0 days    Minutes of Exercise per Session: 0 min  Stress: No Stress Concern Present (05/09/2022)   [place]    Feeling of Stress : Not at all  Social Connections: Socially  Isolated (05/09/2022)   Social Connection and Isolation Panel [NHANES]    Frequency of Communication with Friends and Family: Never    Frequency of Social Gatherings with Friends and Family: Never    Attends Religious Services: Never    Marine scientist or Organizations: No    Attends Archivist Meetings: Never    Marital Status: Widowed   Family History  Problem Relation Age of Onset   Cancer Mother    Diabetes Mother    Heart disease Father    Hypertension Father    Heart disease Brother    Scheduled Meds:  amLODipine  5 mg Oral Daily   aspirin EC  81 mg Oral Daily   atorvastatin  20 mg Oral QHS   divalproex  500 mg Oral BID   enoxaparin (LOVENOX) injection  40 mg Subcutaneous Q24H   feeding supplement  237 mL Oral TID BM   megestrol  400 mg Oral Daily   melatonin  10 mg Oral QHS   mirtazapine  15 mg Oral QHS   multivitamin with minerals  1 tablet Oral Daily   risperiDONE  0.75 mg Oral BID   Continuous Infusions: PRN Meds:.acetaminophen, LORazepam, ondansetron (ZOFRAN) IV, mouth rinse, polyethylene glycol Medications Prior to Admission:  Prior to Admission medications   Medication Sig Start Date End Date Taking? Authorizing Provider  amLODipine (NORVASC) 5 MG tablet Take 1 tablet (5 mg total) by mouth daily. 04/25/22  Yes Cannady, Henrine Screws T, NP  aspirin EC 81 MG tablet Take 1 tablet (81 mg total) by mouth daily. Swallow whole. 03/16/22  Yes Cannady, Jolene T, NP  atorvastatin (LIPITOR) 20 MG tablet Take 1 tablet (20 mg total) by mouth daily. 03/16/22  Yes Cannady, Jolene T, NP  divalproex (DEPAKOTE) 500 MG DR tablet Take 500 mg by mouth 2 (two) times daily. 05/22/22  Yes [provider]  Melatonin 10 MG SUBL Place 1 tablet under the tongue at bedtime. 04/25/22  Yes Cannady, Jolene T, NP  mirtazapine (REMERON) 15 MG tablet Take 1 tablet (15 mg total) by mouth at bedtime. 04/25/22  Yes Cannady, Jolene T, NP  risperiDONE (RISPERDAL) 0.5 MG tablet Take 1 tablet  (0.5 mg total) by mouth 2 (two) times daily. Patient taking differently: Take 0.75 mg by mouth 2 (two) times daily. 04/03/22  Yes Vladimir Crofts, MD  Cholecalciferol (VITAMIN D-1000 MAX ST) 25 MCG (1000 UT) tablet Take by mouth. Patient not taking: Reported on July 11, 2022 06/01/22   [provider]  divalproex (DEPAKOTE) 125 MG DR tablet Take by mouth. Patient not taking:  Reported on 07/14/2022 05/17/22 05/17/23  [provider]  LORazepam (ATIVAN) 0.5 MG tablet Take 0.5 mg by mouth 3 (three) times daily as needed. Patient not taking: Reported on 06/18/2022 05/26/22   [provider]   Allergies  Allergen Reactions   Lotensin [Benazepril Hcl] Swelling   Review of Systems  Unable to perform ROS   Physical Exam Pulmonary:     Effort: Pulmonary effort is normal.  Neurological:     Mental Status: He is alert.     Vital Signs: BP 131/68 (BP Location: Left Arm)   Pulse (!) 57   Temp 98.4 F (36.9 C) (Oral)   Resp (!) 8   Wt 82 kg   SpO2 100%   BMI 23.21 kg/m  Pain Scale: PAINAD   Pain Score: 0-No pain   SpO2: SpO2: 100 % O2 Device:SpO2: 100 % O2 Flow Rate: .   IO: Intake/output summary:  Intake/Output Summary (Last 24 hours) at 07/07/2022 0905 Last data filed at 07/06/2022 1013 Gross per 24 hour  Intake 0 ml  Output --  Net 0 ml    LBM: Last BM Date : 07/05/22 Baseline Weight: Weight: 84 kg (historic weight. Please update during this admission.) Most recent weight: Weight: 82 kg      Signed by: Morton Stall, NP   Please contact Palliative Medicine Team phone at [phone] for questions and concerns.  For individual provider: See Loretha Stapler

## 2022-07-08 DIAGNOSIS — F03918 Unspecified dementia, unspecified severity, with other behavioral disturbance: Secondary | ICD-10-CM | POA: Diagnosis not present

## 2022-07-08 DIAGNOSIS — R627 Adult failure to thrive: Secondary | ICD-10-CM | POA: Diagnosis not present

## 2022-07-08 DIAGNOSIS — E43 Unspecified severe protein-calorie malnutrition: Secondary | ICD-10-CM | POA: Diagnosis not present

## 2022-07-08 NOTE — Progress Notes (Signed)
  Progress Note   Patient: Larry Buckley [id number]:[id number] DOB: [birth year] DOA: 07/05/2022     0 DOS: the patient was seen and examined on 07/08/2022   Brief hospital course: Larry Buckley is a 81 y.o. male with medical history significant for dementia with behavioral disturbance, hypertension, hyperlipidemia, AAA with repair, gout, who initially presented to [facility] ED after aggressive and combative behavior at the group home.  He was brought into the ED on 06/25/2022 via EMS under IVC.  He has been staying in the ED, seen by psychiatry and social worker, awaiting placement.  Admitted to hospitalist service on 9/19 as patient has increased weakness.  Assessment and Plan:  Dementia with behavioral disturbances and delirium. Failure to thrive. Anorexia. Severe protein calorie malnutrition. Patient slept better last night after giving Seroquel. He was eating this morning, seem to have appetite.   Central sleep apnea. Continue to follow.   Swelling in the left hip area. Hip osteoarthritis. Continue to follow-up   Essential hypertension. Continue home medicines    Severe debility. PT/OT.      Subjective:  Patient slept well last night, he is fully awake this morning.  Confused as at baseline, he was eating, seem to have a good appetite.  Physical Exam: Vitals:   07/07/22 0917 07/07/22 1611 07/07/22 1952 07/08/22 0604  BP: (!) 158/78 (!) 131/59 (!) 159/67 (!) 162/58  Pulse: (!) 52 64 (!) 59 60  Resp: 19 18 16 16   Temp:  97.7 F (36.5 C) 97.6 F (36.4 C) 97.8 F (36.6 C)  TempSrc:  Oral Axillary Axillary  SpO2: 100% 100% 98% 100%  Weight:       General exam: Appears calm and comfortable, appears severely malnourished. Respiratory system: Clear to auscultation. Respiratory effort normal. Cardiovascular system: S1 & S2 heard, RRR. No JVD, murmurs, rubs, gallops or clicks. No pedal edema. Gastrointestinal system: Abdomen is nondistended, soft and nontender. No organomegaly or  masses felt. Normal bowel sounds heard. Central nervous system: Alert and oriented x1. No focal neurological deficits. Extremities: Muscle atrophy. Skin: No rashes, lesions or ulcers Psychiatry:  Mood & affect appropriate.   Data Reviewed:  There are no new results to review at this time.  Family Communication: None  Disposition: Status is: Observation   Planned Discharge Destination: Skilled nursing facility    Time spent: 32 minutes  Author: Sharen Hones, MD 07/08/2022 2:20 PM  For on call review [email or web address].

## 2022-07-09 DIAGNOSIS — E43 Unspecified severe protein-calorie malnutrition: Secondary | ICD-10-CM | POA: Diagnosis not present

## 2022-07-09 DIAGNOSIS — R627 Adult failure to thrive: Secondary | ICD-10-CM | POA: Diagnosis not present

## 2022-07-09 DIAGNOSIS — F03918 Unspecified dementia, unspecified severity, with other behavioral disturbance: Secondary | ICD-10-CM | POA: Diagnosis not present

## 2022-07-09 MED ORDER — LACTULOSE 10 GM/15ML PO SOLN
20.0000 g | Freq: Once | ORAL | Status: AC
  Administered 2022-07-09: 20 g via ORAL
  Filled 2022-07-09: qty 30

## 2022-07-09 NOTE — Progress Notes (Signed)
PT Cancellation Note  Patient Details Name: Larry Buckley MRN: [id number] DOB: 01-Nov-1940   Cancelled Treatment:    Reason Eval/Treat Not Completed: Fatigue/lethargy limiting ability to participate  Pt asleep in bed and has not touched breakfast 11:18 at time of attempt.  Did not awaken to verbal cues.  Will return at a later time/date.   Chesley Noon 07/09/2022, 12:58 PM

## 2022-07-09 NOTE — Progress Notes (Signed)
patient pulled out IV last night. they tried to put one back in but he was too combative. Order received from Dr Roosevelt Locks to leave the IV out

## 2022-07-09 NOTE — Progress Notes (Signed)
Patient pulled out his IV and is combative when this RN attempted to stick him again. On-call provider notified. Will pass info on to dayshift RN.

## 2022-07-09 NOTE — Progress Notes (Signed)
  Progress Note   Patient: Larry Buckley [id number]:[id number] DOB: 1941/01/07 DOA: 06/19/2022     0 DOS: the patient was seen and examined on 07/09/2022   Brief hospital course: Larry Buckley is a 81 y.o. male with medical history significant for dementia with behavioral disturbance, hypertension, hyperlipidemia, AAA with repair, gout, who initially presented to [facility] ED after aggressive and combative behavior at the group home.  He was brought into the ED on 07/07/2022 via EMS under IVC.  He has been staying in the ED, seen by psychiatry and social worker, awaiting placement.  Admitted to hospitalist service on 9/19 as patient has increased weakness.  Assessment and Plan: Dementia with behavioral disturbances and delirium. Failure to thrive. Anorexia. Severe protein calorie malnutrition. Patient slept better after giving Seroquel. He has started eating.  He is less agitated.  Currently pending for replacement.   Central sleep apnea. Continue to follow.   Swelling in the left hip area. Hip osteoarthritis. Continue to follow-up   Essential hypertension. Continue home medicines    Severe debility. PT/OT.         Subjective:  Patient still confused as baseline.  Pulled out his IV.  Poor appetite, but eating.  Physical Exam: Vitals:   07/08/22 0604 07/08/22 1704 07/08/22 1912 07/09/22 0655  BP: (!) 162/58 (!) 148/76 (!) 144/71 (!) 150/76  Pulse: 60 85 74 100  Resp: 16 14 20 20   Temp: 97.8 F (36.6 C) 97.7 F (36.5 C) 98.4 F (36.9 C) 97.6 F (36.4 C)  TempSrc: Axillary Oral Oral   SpO2: 100% (!) 56% 100% 99%  Weight:       General exam: Appears calm and comfortable  Respiratory system: Clear to auscultation. Respiratory effort normal. Cardiovascular system: S1 & S2 heard, RRR. No JVD, murmurs, rubs, gallops or clicks. No pedal edema. Gastrointestinal system: Abdomen is nondistended, soft and nontender. No organomegaly or masses felt. Normal bowel sounds heard. Central  nervous system: Alert and oriented x1. No focal neurological deficits. Extremities: Symmetric 5 x 5 power. Skin: No rashes, lesions or ulcers Psychiatry: Judgement and insight appear normal. Mood & affect appropriate.   Data Reviewed:  There are no new results to review at this time.  Family Communication: None  Disposition: Status is: Observation   Planned Discharge Destination: LTAC    Time spent: 26 minutes  Author: Sharen Hones, MD 07/09/2022 12:49 PM  For on call review [email or web address].

## 2022-07-09 NOTE — Progress Notes (Signed)
Patient has not voided on this shift. 479 ml on bladder scan. I tried sitting the patient on the [facility] and staqnding but he was not able to void. He said it feels like it is stuck. No BM since 07/05/22. Order received from Dr Roosevelt Locks to insert foley and give lactulose

## 2022-07-10 DIAGNOSIS — Z515 Encounter for palliative care: Secondary | ICD-10-CM | POA: Diagnosis not present

## 2022-07-10 DIAGNOSIS — K5900 Constipation, unspecified: Secondary | ICD-10-CM | POA: Diagnosis present

## 2022-07-10 DIAGNOSIS — Z20822 Contact with and (suspected) exposure to covid-19: Secondary | ICD-10-CM | POA: Diagnosis present

## 2022-07-10 DIAGNOSIS — R630 Anorexia: Secondary | ICD-10-CM | POA: Diagnosis not present

## 2022-07-10 DIAGNOSIS — R64 Cachexia: Secondary | ICD-10-CM | POA: Diagnosis present

## 2022-07-10 DIAGNOSIS — Z7189 Other specified counseling: Secondary | ICD-10-CM | POA: Diagnosis not present

## 2022-07-10 DIAGNOSIS — R578 Other shock: Secondary | ICD-10-CM | POA: Diagnosis not present

## 2022-07-10 DIAGNOSIS — N3000 Acute cystitis without hematuria: Secondary | ICD-10-CM | POA: Diagnosis not present

## 2022-07-10 DIAGNOSIS — E785 Hyperlipidemia, unspecified: Secondary | ICD-10-CM | POA: Diagnosis present

## 2022-07-10 DIAGNOSIS — F05 Delirium due to known physiological condition: Secondary | ICD-10-CM | POA: Diagnosis present

## 2022-07-10 DIAGNOSIS — Z87891 Personal history of nicotine dependence: Secondary | ICD-10-CM | POA: Diagnosis not present

## 2022-07-10 DIAGNOSIS — E440 Moderate protein-calorie malnutrition: Secondary | ICD-10-CM | POA: Diagnosis not present

## 2022-07-10 DIAGNOSIS — F03C18 Unspecified dementia, severe, with other behavioral disturbance: Secondary | ICD-10-CM | POA: Diagnosis present

## 2022-07-10 DIAGNOSIS — J9811 Atelectasis: Secondary | ICD-10-CM | POA: Diagnosis present

## 2022-07-10 DIAGNOSIS — F03C11 Unspecified dementia, severe, with agitation: Secondary | ICD-10-CM | POA: Diagnosis present

## 2022-07-10 DIAGNOSIS — G4700 Insomnia, unspecified: Secondary | ICD-10-CM | POA: Diagnosis present

## 2022-07-10 DIAGNOSIS — N390 Urinary tract infection, site not specified: Secondary | ICD-10-CM | POA: Diagnosis present

## 2022-07-10 DIAGNOSIS — Z6823 Body mass index (BMI) 23.0-23.9, adult: Secondary | ICD-10-CM | POA: Diagnosis not present

## 2022-07-10 DIAGNOSIS — I10 Essential (primary) hypertension: Secondary | ICD-10-CM | POA: Diagnosis present

## 2022-07-10 DIAGNOSIS — E43 Unspecified severe protein-calorie malnutrition: Secondary | ICD-10-CM | POA: Diagnosis present

## 2022-07-10 DIAGNOSIS — Z8249 Family history of ischemic heart disease and other diseases of the circulatory system: Secondary | ICD-10-CM | POA: Diagnosis not present

## 2022-07-10 DIAGNOSIS — F03C4 Unspecified dementia, severe, with anxiety: Secondary | ICD-10-CM | POA: Diagnosis present

## 2022-07-10 DIAGNOSIS — R4689 Other symptoms and signs involving appearance and behavior: Secondary | ICD-10-CM | POA: Diagnosis present

## 2022-07-10 DIAGNOSIS — R627 Adult failure to thrive: Secondary | ICD-10-CM | POA: Diagnosis present

## 2022-07-10 DIAGNOSIS — Z79899 Other long term (current) drug therapy: Secondary | ICD-10-CM | POA: Diagnosis not present

## 2022-07-10 DIAGNOSIS — R338 Other retention of urine: Secondary | ICD-10-CM | POA: Diagnosis not present

## 2022-07-10 DIAGNOSIS — F03918 Unspecified dementia, unspecified severity, with other behavioral disturbance: Secondary | ICD-10-CM | POA: Diagnosis not present

## 2022-07-10 DIAGNOSIS — G4731 Primary central sleep apnea: Secondary | ICD-10-CM | POA: Diagnosis present

## 2022-07-10 DIAGNOSIS — T83021A Displacement of indwelling urethral catheter, initial encounter: Secondary | ICD-10-CM | POA: Diagnosis not present

## 2022-07-10 DIAGNOSIS — Y738 Miscellaneous gastroenterology and urology devices associated with adverse incidents, not elsewhere classified: Secondary | ICD-10-CM | POA: Diagnosis not present

## 2022-07-10 DIAGNOSIS — R310 Gross hematuria: Secondary | ICD-10-CM | POA: Diagnosis not present

## 2022-07-10 DIAGNOSIS — I469 Cardiac arrest, cause unspecified: Secondary | ICD-10-CM | POA: Diagnosis not present

## 2022-07-10 MED ORDER — HALOPERIDOL 1 MG PO TABS: 1.0000 mg | ORAL_TABLET | Freq: Four times a day (QID) | ORAL | Status: DC | PRN

## 2022-07-10 MED ORDER — LACTULOSE 10 GM/15ML PO SOLN
20.0000 g | Freq: Two times a day (BID) | ORAL | Status: AC
  Administered 2022-07-10 (×2): 20 g via ORAL
  Filled 2022-07-10 (×2): qty 30

## 2022-07-10 MED ORDER — HALOPERIDOL LACTATE 5 MG/ML IJ SOLN: 2.0000 mg | Freq: Four times a day (QID) | INTRAMUSCULAR | Status: DC | PRN

## 2022-07-10 MED ORDER — HALOPERIDOL LACTATE 5 MG/ML IJ SOLN
2.0000 mg | Freq: Four times a day (QID) | INTRAMUSCULAR | Status: DC | PRN
  Administered 2022-07-11 – 2022-08-13 (×16): 2 mg via INTRAMUSCULAR
  Filled 2022-07-10 (×17): qty 1

## 2022-07-10 MED ORDER — HALOPERIDOL 2 MG PO TABS
2.0000 mg | ORAL_TABLET | Freq: Four times a day (QID) | ORAL | Status: DC | PRN
  Administered 2022-07-12 – 2022-08-06 (×12): 2 mg via ORAL
  Filled 2022-07-10 (×16): qty 1

## 2022-07-10 NOTE — Progress Notes (Signed)
Daily Progress Note   Patient Name: Larry Buckley       Date: 07/10/2022 DOB: [birth year]  Age: 81 y.o. MRN#: [id number] Attending Physician: Sharen Hones, MD Primary Care Physician: Venita Lick, NP Admit Date: July 10, 2022  Reason for Consultation/Follow-up: Establishing goals of care  Subjective: Notes reviewed. In to see patient. He is sleeping in bedside chair. Untouched lunch tray in front of him. Per notes he removed his IV the night of the 23rd (not last night) and would not allow reinsertion.   Recommendations:   MAR reviewed. For maintenance of dementia sundowning, would recommend resuming and increasing evening Risperdal; could alternatively start Zyprexa. Could consult psychiatry for recommendations on behavioral management. MOST form remains in chart for DSS review. Discussed recs on 9/21 and left a VM for DSS 9/22.    Length of Stay: 0  Current Medications: Scheduled Meds:   amLODipine  5 mg Oral Daily   aspirin EC  81 mg Oral Daily   atorvastatin  20 mg Oral QHS   divalproex  500 mg Oral BID   enoxaparin (LOVENOX) injection  40 mg Subcutaneous Q24H   feeding supplement  237 mL Oral TID BM   megestrol  400 mg Oral Daily   melatonin  10 mg Oral QHS   mirtazapine  15 mg Oral QHS   multivitamin with minerals  1 tablet Oral Daily   QUEtiapine  50 mg Oral QHS    Continuous Infusions:   PRN Meds: acetaminophen, LORazepam, ondansetron (ZOFRAN) IV, mouth rinse, polyethylene glycol  Physical Exam Constitutional:      Comments: Eyes closed.  Pulmonary:     Effort: Pulmonary effort is normal.             Vital Signs: BP (!) 130/54 (BP Location: Left Arm)   Pulse 84   Temp 97.9 F (36.6 C) (Oral)   Resp 12   Wt 82 kg   SpO2 100%   BMI 23.21 kg/m  SpO2:  SpO2: 100 % O2 Device: O2 Device: Room Air O2 Flow Rate:    Intake/output summary:  Intake/Output Summary (Last 24 hours) at 07/10/2022 1332 Last data filed at 07/10/2022 1026 Gross per 24 hour  Intake 480 ml  Output 800 ml  Net -320 ml   LBM: Last BM Date : 07/05/22 Baseline Weight:  Weight: 84 kg (historic weight. Please update during this admission.) Most recent weight: Weight: 82 kg         Patient Active Problem List   Diagnosis Date Noted   Protein-calorie malnutrition, severe 07/07/2022   Anorexia 07/05/2022   Protein-calorie malnutrition, moderate (HCC) 07/05/2022   Failure to thrive in adult 07/04/2022   Acute pain of right shoulder 06/08/2022   Acute hip pain, left 06/08/2022   Low hemoglobin 04/26/2022   Edema of both legs 01/27/2022   Prediabetes 01/04/2022   Dementia with behavioral disturbance (HCC) 12/29/2021   History of abdominal aortic aneurysm (AAA) repair 09/25/2021   B12 deficiency 07/11/2021   Hyperlipidemia, mixed    Hypertension    Gout, arthropathy     Code Status:    Code Status Orders  (From admission, onward)           Start     Ordered   07/04/22 2111  Full code  Continuous        07/04/22 2110           Code Status History     Date Active Date Inactive Code Status Order ID Comments User Context   07/06/2022 1337 07/04/2022 2110 Full Code [id number]  Minna Antis, MD ED   05/25/2022 2232 05/26/2022 0836 Full Code [id number]  Wynonia Sours, RN ED   03/30/2022 1406 04/03/2022 1950 Full Code [id number]  Minna Antis, MD ED   12/29/2021 0041 01/11/2022 1936 Full Code [id number]  Andris Baumann, MD ED       Care plan was discussed with [facility]  Thank you for allowing the Palliative Medicine Team to assist in the care of this patient.   Morton Stall, NP  Please contact Palliative Medicine Team phone at [phone] for questions and concerns.

## 2022-07-10 NOTE — Progress Notes (Addendum)
  Progress Note   Patient: Larry Buckley:[id number] DOB: [birth year] DOA: 06/26/2022     0 DOS: the patient was seen and examined on 07/10/2022   Brief hospital course: Larry Buckley is a 81 y.o. male with medical history significant for dementia with behavioral disturbance, hypertension, hyperlipidemia, AAA with repair, gout, who initially presented to [facility] ED after aggressive and combative behavior at the group home.  He was brought into the ED on 07/15/2022 via EMS under IVC.  He has been staying in the ED, seen by psychiatry and social worker, awaiting placement.  Admitted to hospitalist service on 9/19 as patient has increased weakness.  Assessment and Plan: Dementia with behavioral disturbances and delirium. Failure to thrive. Anorexia. Severe protein calorie malnutrition. Spoke with RN, patient slept the entire night last night, he pulled IV out 2 nights ago.  He has not had any agitation today.  He only ate 25% of the meal. Appreciated palliative care consult, continue goals of care discussion with DSS. Patient prognosis is poor, I believe he will be a candidate for outpatient hospice.   Central sleep apnea. Continue to follow.   Swelling in the left hip area. Hip osteoarthritis. Continue to follow-up   Essential hypertension. Continue home medicines   Constipation. We will give lactulose twice a day for 2 doses.   Severe debility. PT/OT.       Subjective:  I spoke with RN, patient slept the entire night last night.  But still has a very poor appetite.  Physical Exam: Vitals:   07/09/22 1940 07/10/22 0324 07/10/22 0804 07/10/22 0812  BP: (!) 135/54 (!) 153/69 (!) 130/54   Pulse: 65 (!) 56 (!) 38 84  Resp: 18 16 12 12   Temp: 98.6 F (37 C) 98 F (36.7 C) 97.9 F (36.6 C)   TempSrc:  Oral Oral   SpO2: 92% 100% 99% 100%  Weight:       General exam: Appears calm and comfortable  Respiratory system: Clear to auscultation. Respiratory effort  normal. Cardiovascular system: S1 & S2 heard, RRR. No JVD, murmurs, rubs, gallops or clicks. No pedal edema. Gastrointestinal system: Abdomen is nondistended, soft and nontender. No organomegaly or masses felt. Normal bowel sounds heard. Central nervous system: Alert and oriented x1. No focal neurological deficits. Extremities: Symmetric 5 x 5 power. Skin: No rashes, lesions or ulcers Psychiatry: Flat affect  Data Reviewed:  There are no new results to review at this time.  Family Communication:   Disposition: Status is: Inpatient Remains inpatient appropriate because: Unsafe for discharge.  Planned Discharge Destination:  TBD    Time spent: 26 minutes  Author: Sharen Hones, MD 07/10/2022 2:02 PM  For on call review [email or web address].

## 2022-07-10 NOTE — Progress Notes (Signed)
Physical Therapy Treatment Patient Details Name: Larry Buckley MRN: [id number] DOB: 12-20-1940 Today's Date: 07/10/2022   History of Present Illness Pt admitted for dementia iwth behavior disturbance. Pt from family care home and is poor historian.    PT Comments    Pt in bed, awake.  Ready to get up.  Min a x 1 to EOB.  Steady in sitting.  He is able to stand with min a x 2 and has difficulty stepping forward.  Does sidestep with max verbal and tactile cues.  Transfers to chair at bedside with increased time but only requires min a x 2.  Tried to get him to progress gait forward but he was unable.  He did remain up in chair with safety precautions and mitts in place.     Recommendations for follow up therapy are one component of a multi-disciplinary discharge planning process, led by the attending physician.  Recommendations may be updated based on patient status, additional functional criteria and insurance authorization.  Follow Up Recommendations  Home health PT     Assistance Recommended at Discharge Frequent or constant Supervision/Assistance  Patient can return home with the following Assistance with cooking/housework;Assistance with feeding;Direct supervision/assist for medications management;Direct supervision/assist for financial management;Assist for transportation;Help with stairs or ramp for entrance;A lot of help with bathing/dressing/bathroom;A lot of help with walking and/or transfers   Equipment Recommendations  Rolling walker (2 wheels);BSC/3in1    Recommendations for Other Services       Precautions / Restrictions Precautions Precautions: Fall Restrictions Weight Bearing Restrictions: No     Mobility  Bed Mobility Overal bed mobility: Needs Assistance       Supine to sit: Min assist          Transfers Overall transfer level: Needs assistance Equipment used: Rolling walker (2 wheels) Transfers: Sit to/from Stand Sit to Stand: Min assist, +2  safety/equipment                Ambulation/Gait Ambulation/Gait assistance: Min guard, +2 safety/equipment Gait Distance (Feet): 3 Feet Assistive device: Rolling walker (2 wheels)   Gait velocity: decreased     General Gait Details: slow shuffling gait with cues for stepping   Stairs             Wheelchair Mobility    Modified Rankin (Stroke Patients Only)       Balance Overall balance assessment: Needs assistance Sitting-balance support: Feet supported Sitting balance-Leahy Scale: Good     Standing balance support: Bilateral upper extremity supported Standing balance-Leahy Scale: Fair (2//2 cognitive impairment) Standing balance comment: with RW                            Cognition Arousal/Alertness: Suspect due to medications Behavior During Therapy: Flat affect Overall Cognitive Status: History of cognitive impairments - at baseline                                          Exercises      General Comments        Pertinent Vitals/Pain Pain Assessment Pain Assessment: No/denies pain    Home Living                          Prior Function            PT Goals (  current goals can now be found in the care plan section) Progress towards PT goals: Progressing toward goals    Frequency    Min 2X/week      PT Plan Current plan remains appropriate    Co-evaluation              AM-PAC PT "6 Clicks" Mobility   Outcome Measure  Help needed turning from your back to your side while in a flat bed without using bedrails?: A Little Help needed moving from lying on your back to sitting on the side of a flat bed without using bedrails?: A Little Help needed moving to and from a bed to a chair (including a wheelchair)?: A Little Help needed standing up from a chair using your arms (e.g., wheelchair or bedside chair)?: A Little Help needed to walk in hospital room?: A Little Help needed climbing 3-5  steps with a railing? : Total 6 Click Score: 16    End of Session Equipment Utilized During Treatment: Gait belt Activity Tolerance: Patient tolerated treatment well Patient left: in chair;with call bell/phone within reach;with chair alarm set Nurse Communication: Mobility status PT Visit Diagnosis: Unsteadiness on feet (R26.81);Muscle weakness (generalized) (M62.81);Difficulty in walking, not elsewhere classified (R26.2)     Time: 7622-6333 PT Time Calculation (min) (ACUTE ONLY): 15 min  Charges:  $Therapeutic Activity: 8-22 mins                   Danielle Dess, PTA 07/10/22, 10:09 AM

## 2022-07-11 ENCOUNTER — Ambulatory Visit: Payer: Medicare PPO | Admitting: Psychiatry

## 2022-07-11 DIAGNOSIS — Z7189 Other specified counseling: Secondary | ICD-10-CM | POA: Diagnosis not present

## 2022-07-11 DIAGNOSIS — R338 Other retention of urine: Secondary | ICD-10-CM | POA: Insufficient documentation

## 2022-07-11 DIAGNOSIS — R627 Adult failure to thrive: Secondary | ICD-10-CM | POA: Diagnosis not present

## 2022-07-11 DIAGNOSIS — G4731 Primary central sleep apnea: Secondary | ICD-10-CM

## 2022-07-11 DIAGNOSIS — E43 Unspecified severe protein-calorie malnutrition: Secondary | ICD-10-CM | POA: Diagnosis not present

## 2022-07-11 DIAGNOSIS — F03918 Unspecified dementia, unspecified severity, with other behavioral disturbance: Secondary | ICD-10-CM | POA: Diagnosis not present

## 2022-07-11 LAB — CREATININE, SERUM
Creatinine, Ser: 0.76 mg/dL (ref 0.61–1.24)
GFR, Estimated: >60 mL/min (ref >60)

## 2022-07-11 MED ORDER — SENNOSIDES-DOCUSATE SODIUM 8.6-50 MG PO TABS
2.0000 | ORAL_TABLET | Freq: Two times a day (BID) | ORAL | Status: DC
  Administered 2022-07-11 – 2022-10-02 (×150): 2 via ORAL
  Filled 2022-07-11 (×152): qty 2

## 2022-07-11 MED ORDER — LACTULOSE 10 GM/15ML PO SOLN: 20.0000 g | Freq: Two times a day (BID) | ORAL | Status: DC | PRN

## 2022-07-11 MED ORDER — RISPERIDONE 1 MG PO TBDP
0.5000 mg | ORAL_TABLET | Freq: Two times a day (BID) | ORAL | Status: DC
  Administered 2022-07-12 – 2022-07-14 (×5): 0.5 mg via ORAL
  Filled 2022-07-11 (×6): qty 0.5

## 2022-07-11 MED ORDER — CHLORHEXIDINE GLUCONATE CLOTH 2 % EX PADS
6.0000 | MEDICATED_PAD | Freq: Every day | CUTANEOUS | Status: DC
  Administered 2022-07-11 – 2022-07-30 (×18): 6 via TOPICAL

## 2022-07-11 MED ORDER — POLYETHYLENE GLYCOL 3350 17 G PO PACK
17.0000 g | PACK | Freq: Two times a day (BID) | ORAL | Status: DC
  Administered 2022-07-11 – 2022-10-02 (×136): 17 g via ORAL
  Filled 2022-07-11 (×141): qty 1

## 2022-07-11 NOTE — TOC Progression Note (Addendum)
Transition of Care [facility]) - Progression Note    Patient Details  Name: Larry Buckley MRN: [id number] Date of Birth: 07/17/1941  Transition of [facility]) CM/SW Contact  Beverly Sessions, RN Phone Number: 07/11/2022, 4:01 PM  Clinical Narrative:     Updated Fl2 secure emailed to [facility] with DSS as requested, and I have inquired about placement updated  In [place] I have sent referral to [facility], [facility], [facility] and [facility] care   Case discussed with [facility] director   Expected Discharge Plan: Memory Care Barriers to Discharge: Other (must enter comment) (Facility will not accept patient back)  Expected Discharge Plan and Services Expected Discharge Plan: Memory Care   Discharge Planning Services: CM Consult   Living arrangements for the past 2 months: Group Home                 DME Arranged: N/A DME Agency: NA       HH Arranged: NA HH Agency: NA         Social Determinants of Health (SDOH) Interventions    Readmission Risk Interventions     No data to display

## 2022-07-11 NOTE — Progress Notes (Signed)
  Progress Note   Patient: Larry Buckley YQM:[id number] DOB: [birth year] DOA: 07/19/2022     1 DOS: the patient was seen and examined on 07/11/2022   Brief hospital course: Larry Buckley is a 81 y.o. male with medical history significant for dementia with behavioral disturbance, hypertension, hyperlipidemia, AAA with repair, gout, who initially presented to [facility] ED after aggressive and combative behavior at the group home.  He was brought into the ED on 07/19/2022 via EMS under IVC.  He has been staying in the ED, seen by psychiatry and social worker, awaiting placement.  Admitted to hospitalist service on 9/19 as patient has increased weakness. Patient has significant agitation, barely eating.  Perative care consult obtained, currently working with DSS for goal of care.   Assessment and Plan: Dementia with behavioral disturbances and delirium. Failure to thrive. Anorexia. Severe protein calorie malnutrition. Continue has agitation at nighttime, very poor appetite.  Eating intermittently.  I will obtain psychiatry consult. Patient long-term prognosis is very poor, palliative care is talking to DSS about possibility of comfort care and DNR. Currently pending placement.  Urinary retention. Foley catheter was anchored.   Central sleep apnea. Continue to follow.   Swelling in the left hip area. Hip osteoarthritis. Continue to follow-up   Essential hypertension. Continue home medicines    Constipation. Still had no bowel movement after 2 doses of lactulose, added scheduled MiraLAX and senna.  Continue as needed lactulose.   Severe debility. PT/OT.       Subjective:  Patient still agitated at nighttime, appetite is poor.  Confused at baseline.  Physical Exam: Vitals:   07/10/22 1547 07/10/22 1943 07/11/22 0437 07/11/22 0747  BP: (!) 129/57 (!) 142/59 130/61 (!) 141/61  Pulse: 86 (!) 57 (!) 57 (!) 59  Resp: 16 20 18 18   Temp: 98.8 F (37.1 C) 98 F (36.7 C) 98.7 F (37.1  C) 97.7 F (36.5 C)  TempSrc:   Oral Axillary  SpO2: (!) 83% 93% 95% 98%  Weight:       General exam: Ill-appearing, severely malnourished. Respiratory system: Clear to auscultation. Respiratory effort normal. Cardiovascular system: S1 & S2 heard, RRR. No JVD, murmurs, rubs, gallops or clicks. No pedal edema. Gastrointestinal system: Abdomen is nondistended, soft and nontender. No organomegaly or masses felt. Normal bowel sounds heard. Central nervous system: Drowsy and confused. Extremities: Symmetric 5 x 5 power. Skin: No rashes, lesions or ulcers   Data Reviewed:  There are no new results to review at this time.  Family Communication:   Disposition: Status is: Inpatient Remains inpatient appropriate because: Unsafe discharge, pending placement.  Planned Discharge Destination: Skilled nursing facility    Time spent: 35 minutes  Author: Sharen Hones, MD 07/11/2022 1:54 PM  For on call review [email or web address].

## 2022-07-11 NOTE — NC FL2 (Signed)
[place] LEVEL OF CARE SCREENING TOOL     IDENTIFICATION  Patient Name: Larry Buckley Birthdate: Dec 28, 1940 Sex: male Admission Date (Current Location): 06/19/2022  [facility] and [place] Number:  Engineering geologist and Address:  [facility], [address], [place], [place] [address]      Provider Number: [id number]  Attending Physician Name and Address:  Sharen Hones, MD  Relative Name and Phone Number:  Toula Moos [phone]    Current Level of Care: Hospital Recommended Level of Care: Memory Care Prior Approval Number:    Date Approved/Denied:   PASRR Number: [id number]  Discharge Plan: Other (Comment) (Memory Care)    Current Diagnoses: Dementia  Patient Active Problem List   Diagnosis Date Noted   Central sleep apnea 07/11/2022   Acute urinary retention 07/11/2022   Protein-calorie malnutrition, severe 07/07/2022   Anorexia 07/05/2022   Protein-calorie malnutrition, moderate ([place]) 07/05/2022   Failure to thrive in adult 07/04/2022   Acute pain of right shoulder 06/08/2022   Acute hip pain, left 06/08/2022   Low hemoglobin 04/26/2022   Edema of both legs 01/27/2022   Prediabetes 01/04/2022   Dementia with behavioral disturbance ([place]) 12/29/2021   History of abdominal aortic aneurysm (AAA) repair 09/25/2021   B12 deficiency 07/11/2021   Hyperlipidemia, mixed    Essential hypertension    Gout, arthropathy     Orientation RESPIRATION BLADDER Height & Weight     Self  Normal Indwelling catheter Weight: 82 kg Height:     BEHAVIORAL SYMPTOMS/MOOD NEUROLOGICAL BOWEL NUTRITION STATUS  [place]   Continent Diet (dys 3)  AMBULATORY STATUS COMMUNICATION OF NEEDS Skin   Limited Assist Verbally Normal                       Personal Care Assistance Level of Assistance  Bathing, Feeding, Dressing Bathing Assistance: Limited assistance Feeding assistance: Limited assistance Dressing Assistance: Limited  assistance     Functional Limitations Info             [place]  PT (By licensed PT), OT (By licensed OT)     PT Frequency: Home health OT Frequency: Home Health            Contractures Contractures Info: Not present    Additional Factors Info  Code Status, Allergies Code Status Info: Full Allergies Info: Lotensin           Current Medications (07/11/2022):  This is the current hospital active medication list Current Facility-Administered Medications  Medication Dose Route Frequency Provider Last Rate Last Admin   acetaminophen (TYLENOL) tablet 650 mg  650 mg Oral Q6H PRN [place] Crane, Carole N, DO       amLODipine (NORVASC) tablet 5 mg  5 mg Oral Daily Waldon Merl F, NP   5 mg at 07/11/22 [id number]   aspirin EC tablet 81 mg  81 mg Oral Daily Waldon Merl F, NP   81 mg at 07/11/22 0904   atorvastatin (LIPITOR) tablet 20 mg  20 mg Oral QHS Waldon Merl F, NP   20 mg at 07/10/22 2149   Chlorhexidine Gluconate Cloth 2 % PADS 6 each  6 each Topical Daily Sharen Hones, MD   6 each at 07/11/22 0905   divalproex (DEPAKOTE) DR tablet 500 mg  500 mg Oral BID Waldon Merl F, NP   500 mg at 07/11/22 0904   enoxaparin (LOVENOX) injection 40 mg  40 mg Subcutaneous Q24H Hall,  Lorenda Cahill, DO   40 mg at 07/10/22 2151   feeding supplement (ENSURE ENLIVE / ENSURE PLUS) liquid 237 mL  237 mL Oral TID BM Sharen Hones, MD   237 mL at 07/11/22 0905   haloperidol (HALDOL) tablet 2 mg  2 mg Oral Q6H PRN Sharen Hones, MD       Or   haloperidol lactate (HALDOL) injection 2 mg  2 mg Intramuscular Q6H PRN Sharen Hones, MD       lactulose (CHRONULAC) 10 GM/15ML solution 20 g  20 g Oral BID PRN Sharen Hones, MD       megestrol (MEGACE) 400 MG/10ML suspension 400 mg  400 mg Oral Daily Sharen Hones, MD   400 mg at 07/11/22 0973   melatonin tablet 10 mg  10 mg Oral QHS Waldon Merl F, NP   10 mg at 07/10/22 2149   mirtazapine (REMERON) tablet 15 mg  15 mg Oral QHS Waldon Merl F, NP   15 mg at 07/10/22 2149   multivitamin with minerals tablet 1 tablet  1 tablet Oral Daily Sharen Hones, MD   1 tablet at 07/11/22 0904   ondansetron (ZOFRAN) injection 4 mg  4 mg Intravenous Q6H PRN Kayleen Memos, DO       Oral care mouth rinse  15 mL Mouth Rinse PRN Sharen Hones, MD       polyethylene glycol (MIRALAX / GLYCOLAX) packet 17 g  17 g Oral BID Sharen Hones, MD   17 g at 07/11/22 1413   QUEtiapine (SEROQUEL) tablet 50 mg  50 mg Oral QHS Sharen Hones, MD   50 mg at 07/10/22 2149   senna-docusate (Senokot-S) tablet 2 tablet  2 tablet Oral BID Sharen Hones, MD   2 tablet at 07/11/22 1413     Discharge Medications: Please see discharge summary for a list of discharge medications.  Relevant Imaging Results:  Relevant Lab Results:   Additional Information SSN: [id number]  Beverly Sessions, RN

## 2022-07-11 NOTE — Progress Notes (Signed)
Daily Progress Note   Patient Name: Larry Buckley       Date: 07/11/2022 DOB: Feb 15, 1941  Age: 81 y.o. MRN#: [id number] Attending Physician: Sharen Hones, MD Primary Care Physician: Venita Lick, NP Admit Date: 07/11/2022  Reason for Consultation/Follow-up: Establishing goals of care  Subjective: Notes reviewed. In to see patient, he is resting in bed. No family/ visitor at bedside.  Per notes he is doing better with sleeping through the night, and with agitation.  Per notes his oral intake has been poor.    Attempted to reach DSS worker unsuccessfully.  Spoke with TOC.  TOC states that they are updating patient's FL 2 form for placement as per DSS worker's request.  Patient is working towards discharge.  Megace was initiated on 9/20. Given his poor p.o. intake, patient would be a candidate for outpatient hospice if DSS wishes.  Unsigned and updated MOST form remains in the chart if DSS would like to complete it.  As discussed on 9/21, it has been marked for DNR, comfort measures, antibiotic use to be determined at time of me, no IV fluids, no feeding tube.  Could continue to treat the treatable outpatient and help with his quality of life and symptoms, with no artificial life prolongation.  Patient is currently followed by outpatient palliative we would recommend would recommend outpatient palliative to continue.  Length of Stay: 1  Current Medications: Scheduled Meds:   amLODipine  5 mg Oral Daily   aspirin EC  81 mg Oral Daily   atorvastatin  20 mg Oral QHS   Chlorhexidine Gluconate Cloth  6 each Topical Daily   divalproex  500 mg Oral BID   enoxaparin (LOVENOX) injection  40 mg Subcutaneous Q24H   feeding supplement  237 mL Oral TID BM   megestrol  400 mg Oral Daily    melatonin  10 mg Oral QHS   mirtazapine  15 mg Oral QHS   multivitamin with minerals  1 tablet Oral Daily   QUEtiapine  50 mg Oral QHS    Continuous Infusions:   PRN Meds: acetaminophen, haloperidol **OR** haloperidol lactate, ondansetron (ZOFRAN) IV, mouth rinse, polyethylene glycol  Physical Exam          Vital Signs: BP (!) 141/61 (BP Location: Right Arm)   Pulse (!) 59  Temp 97.7 F (36.5 C) (Axillary)   Resp 18   Wt 82 kg   SpO2 98%   BMI 23.21 kg/m  SpO2: SpO2: 98 % O2 Device: O2 Device: Room Air O2 Flow Rate:    Intake/output summary:  Intake/Output Summary (Last 24 hours) at 07/11/2022 1342 Last data filed at 07/11/2022 9798 Gross per 24 hour  Intake 240 ml  Output 1100 ml  Net -860 ml   LBM: Last BM Date : 07/05/22 Baseline Weight: Weight: 84 kg (historic weight. Please update during this admission.) Most recent weight: Weight: 82 kg    Patient Active Problem List   Diagnosis Date Noted   Protein-calorie malnutrition, severe 07/07/2022   Anorexia 07/05/2022   Protein-calorie malnutrition, moderate ([place]) 07/05/2022   Failure to thrive in adult 07/04/2022   Acute pain of right shoulder 06/08/2022   Acute hip pain, left 06/08/2022   Low hemoglobin 04/26/2022   Edema of both legs 01/27/2022   Prediabetes 01/04/2022   Dementia with behavioral disturbance ([place]) 12/29/2021   History of abdominal aortic aneurysm (AAA) repair 09/25/2021   B12 deficiency 07/11/2021   Hyperlipidemia, mixed    Hypertension    Gout, arthropathy     Palliative Care Assessment & Plan    Recommendations/Plan: Recommend continued outpatient palliative. Per notes appetite is poor with Megace in place.  Patient would be a candidate for outpatient hospice. Attempted unsuccessfully to reach DSS today. I updated an unsigned MOST form remains in patient's chart if DSS wishes to complete it.  It is marked for DNR, comfort measures, antibiotics to be determined at time of need, no  feeding tube, no IV fluids. Could continue to treat the treatable outpatient and help with his quality of life and symptoms, with no artificial life prolongation.   Code Status:    Code Status Orders  (From admission, onward)           Start     Ordered   07/04/22 2111  Full code  Continuous        07/04/22 2110           Code Status History     Date Active Date Inactive Code Status Order ID Comments User Context   Jul 26, 2022 1337 07/04/2022 2110 Full Code [id number]  Harvest Dark, MD ED   05/25/2022 2232 05/26/2022 0836 Full Code [id number]  Verlene Mayer, RN ED   03/30/2022 1406 04/03/2022 1950 Full Code [id number]  Harvest Dark, MD ED   12/29/2021 0041 01/11/2022 1936 Full Code [id number]  Athena Masse, MD ED       Care plan was discussed with [facility]  Thank you for allowing the Palliative Medicine Team to assist in the care of this patient.   Asencion Gowda, NP  Please contact Palliative Medicine Team phone at [phone] for questions and concerns.

## 2022-07-11 NOTE — Progress Notes (Signed)
Physical Therapy Treatment Patient Details Name: Larry Buckley MRN: [id number] DOB: September 29, 1941 Today's Date: 07/11/2022   History of Present Illness Pt admitted for dementia iwth behavior disturbance. Pt from family care home and is poor historian.    PT Comments    Pt received in bed with mittens on B hands 2/2 to pt pulls his Foley out. Pt lethargic and needed max VC and TC to arouse the pt. Pt supine to sit with max assist. Sat on the EOB for 7 mins with sup. Pt STS with Mod of 2 with RW with VC for hand placement and to correct the posture. Pt transferred to chair with Mod of 2. NT training for transfers and pt condition. Pt took longer to get aroused today. Pt made comfortable in chair with BLE elevated. Foley in place. Pt will benefit from HHPT after acute care with 24 hour sup.    Recommendations for follow up therapy are one component of a multi-disciplinary discharge planning process, led by the attending physician.  Recommendations may be updated based on patient status, additional functional criteria and insurance authorization.  Follow Up Recommendations  Home health PT     Assistance Recommended at Discharge Frequent or constant Supervision/Assistance  Patient can return home with the following Assistance with cooking/housework;Assistance with feeding;Direct supervision/assist for medications management;Direct supervision/assist for financial management;Assist for transportation;Help with stairs or ramp for entrance;A lot of help with bathing/dressing/bathroom;A lot of help with walking and/or transfers   Equipment Recommendations       Recommendations for Other Services       Precautions / Restrictions Precautions Precautions: Fall Restrictions Weight Bearing Restrictions: No     Mobility  Bed Mobility Overal bed mobility: Needs Assistance Bed Mobility: Supine to Sit     Supine to sit: Max assist     General bed mobility comments: received in bed     Transfers Overall transfer level: Needs assistance Equipment used: Rolling walker (2 wheels) Transfers: Sit to/from Stand, Bed to chair/wheelchair/BSC Sit to Stand: Mod assist, +2 safety/equipment Stand pivot transfers: Mod assist, +2 safety/equipment         General transfer comment: VC to initiate, and for hand and feet placement. Needs time to follow commnads    Ambulation/Gait Ambulation/Gait assistance:  (Pt unable to walk today 2/2 lethargy.)                 Stairs             Wheelchair Mobility    Modified Rankin (Stroke Patients Only)       Balance Overall balance assessment: Needs assistance Sitting-balance support: Feet supported Sitting balance-Leahy Scale: Good   Postural control: Right lateral lean Standing balance support: Bilateral upper extremity supported Standing balance-Leahy Scale: Fair Standing balance comment: with RW and difficulty with upright posture.                            Cognition Arousal/Alertness: Suspect due to medications Behavior During Therapy: Flat affect Overall Cognitive Status: History of cognitive impairments - at baseline                                          Exercises      General Comments        Pertinent Vitals/Pain      Home Living  Prior Function            PT Goals (current goals can now be found in the care plan section) Acute Rehab PT Goals Patient Stated Goal: unable to state PT Goal Formulation: Patient unable to participate in goal setting Time For Goal Achievement: 07/16/22 Potential to Achieve Goals: Fair Progress towards PT goals: Progressing toward goals    Frequency    Min 2X/week      PT Plan Current plan remains appropriate    Co-evaluation              AM-PAC PT "6 Clicks" Mobility   Outcome Measure  Help needed turning from your back to your side while in a flat bed without using  bedrails?: None Help needed moving from lying on your back to sitting on the side of a flat bed without using bedrails?: A Lot Help needed moving to and from a bed to a chair (including a wheelchair)?: A Little Help needed standing up from a chair using your arms (e.g., wheelchair or bedside chair)?: A Little Help needed to walk in hospital room?: A Lot Help needed climbing 3-5 steps with a railing? : A Lot 6 Click Score: 16    End of Session Equipment Utilized During Treatment: Gait belt Activity Tolerance: Patient tolerated treatment well Patient left: in chair;with call bell/phone within reach;with chair alarm set Nurse Communication: Mobility status PT Visit Diagnosis: Unsteadiness on feet (R26.81);Muscle weakness (generalized) (M62.81);Difficulty in walking, not elsewhere classified (R26.2)     Time: 6606-3016 PT Time Calculation (min) (ACUTE ONLY): 13 min  Charges:  $Therapeutic Activity: 8-22 mins                     Cadance Raus PT DPT 1:47 PM,07/11/22

## 2022-07-11 NOTE — Consult Note (Signed)
[facility] Face-to-Face Psychiatry Consult   Reason for Consult: Consult for this 81 year old man with a history of dementia admitted to the hospital with failure to thrive on top of chronic behavior problems from dementia still having agitation in the hospital Referring Physician:  Chipper Herb Patient Identification: CYLE KENYON MRN:  [id number] Principal Diagnosis: Dementia with behavioral disturbance [facility]) Diagnosis:  Principal Problem:   Dementia with behavioral disturbance (HCC) Active Problems:   Essential hypertension   Failure to thrive in adult   Anorexia   Protein-calorie malnutrition, moderate (HCC)   Protein-calorie malnutrition, severe   Central sleep apnea   Acute urinary retention   Total Time spent with patient: 30 minutes  Subjective:   MICHARL HELMES is a 81 y.o. male patient admitted with "I am okay".  HPI: Patient seen and chart reviewed.  81 year old man with a known history of dementia and behavior problems admitted to the medical service after becoming weekend having decreased ability and self-care while in the emergency room.  Nursing reports patient has continued to be agitated intermittently through the day.  Pulls out lines.  Resists treatment.  They had just given him Haldol sometime shortly before I saw him.  He was still awake but seems to have settled down a little bit.  Patient made little eye contact.  He was able to tell me his name but was not able to tell me where he it is.  Could not answer any other questions reasonably.  Not striking out or agitated but not also following any directions  Past Psychiatric History: Patient has been seen multiple times over the last few months because of worsening behavior problems from dementia  Risk to Self:   Risk to Others:   Prior Inpatient Therapy:   Prior Outpatient Therapy:    Past Medical History:  Past Medical History:  Diagnosis Date   AAA (abdominal aortic aneurysm) (HCC)    Dementia (HCC) 05/21/2020    Gout, arthropathy    Hyperlipidemia    Hypertension     Past Surgical History:  Procedure Laterality Date   PERIPHERAL VASCULAR CATHETERIZATION N/A 02/08/2016   Procedure: Endovascular Repair/Stent Graft;  Surgeon: Annice Needy, MD;  Location: [facility] INVASIVE CV LAB;  Service: Cardiovascular;  Laterality: N/A;   Family History:  Family History  Problem Relation Age of Onset   Cancer Mother    Diabetes Mother    Heart disease Father    Hypertension Father    Heart disease Brother    Family Psychiatric  History: See previous Social History:  Social History   Substance and Sexual Activity  Alcohol Use Yes     Social History   Substance and Sexual Activity  Drug Use No    Social History   Socioeconomic History   Marital status: Widowed    Spouse name: Not on file   Number of children: Not on file   Years of education: Not on file   Highest education level: Not on file  Occupational History   Not on file  Tobacco Use   Smoking status: Former   Smokeless tobacco: Never  Substance and Sexual Activity   Alcohol use: Yes   Drug use: No   Sexual activity: Not Currently  Other Topics Concern   Not on file  Social History Narrative   Not on file   Social Determinants of Health   Financial Resource Strain: Low Risk  (05/09/2022)   Overall Financial Resource Strain (CARDIA)    Difficulty of Paying  Living Expenses: Not hard at all  Food Insecurity: No Food Insecurity (05/09/2022)   Hunger Vital Sign    Worried About Running Out of Food in the Last Year: Never true    Ran Out of Food in the Last Year: Never true  Transportation Needs: No Transportation Needs (05/09/2022)   PRAPARE - Administrator, Civil Service (Medical): No    Lack of Transportation (Non-Medical): No  Physical Activity: Inactive (05/09/2022)   Exercise Vital Sign    Days of Exercise per Week: 0 days    Minutes of Exercise per Session: 0 min  Stress: No Stress Concern Present (05/09/2022)    [employer] of Occupational Health - Occupational Stress Questionnaire    Feeling of Stress : Not at all  Social Connections: Socially Isolated (05/09/2022)   Social Connection and Isolation Panel [NHANES]    Frequency of Communication with Friends and Family: Never    Frequency of Social Gatherings with Friends and Family: Never    Attends Religious Services: Never    Database administrator or Organizations: No    Attends Banker Meetings: Never    Marital Status: Widowed   Additional Social History:    Allergies:   Allergies  Allergen Reactions   Lotensin [Benazepril Hcl] Swelling    Labs:  Results for orders placed or performed during the hospital encounter of 06/16/2022 (from the past 48 hour(s))  Creatinine, serum     Status: None   Collection Time: 07/11/22  6:09 AM  Result Value Ref Range   Creatinine, Ser 0.76 0.61 - 1.24 mg/dL   GFR, Estimated >95 >28 mL/min    Comment: (NOTE) Calculated using the CKD-EPI Creatinine Equation (2021) Performed at [facility], [address] [place], [place] [address]     Current Facility-Administered Medications  Medication Dose Route Frequency Provider Last Rate Last Admin   acetaminophen (TYLENOL) tablet 650 mg  650 mg Oral Q6H PRN Hall, Carole N, DO       amLODipine (NORVASC) tablet 5 mg  5 mg Oral Daily Gabriel Cirri F, NP   5 mg at 07/11/22 4010   aspirin EC tablet 81 mg  81 mg Oral Daily Gabriel Cirri F, NP   81 mg at 07/11/22 0904   atorvastatin (LIPITOR) tablet 20 mg  20 mg Oral QHS Gabriel Cirri F, NP   20 mg at 07/10/22 2149   Chlorhexidine Gluconate Cloth 2 % PADS 6 each  6 each Topical Daily Marrion Coy, MD   6 each at 07/11/22 0905   divalproex (DEPAKOTE) DR tablet 500 mg  500 mg Oral BID Gabriel Cirri F, NP   500 mg at 07/11/22 0904   enoxaparin (LOVENOX) injection 40 mg  40 mg Subcutaneous Q24H Hall, Carole N, DO   40 mg at 07/10/22 2151   feeding supplement (ENSURE ENLIVE /  ENSURE PLUS) liquid 237 mL  237 mL Oral TID BM Marrion Coy, MD   237 mL at 07/11/22 0905   haloperidol (HALDOL) tablet 2 mg  2 mg Oral Q6H PRN Marrion Coy, MD       Or   haloperidol lactate (HALDOL) injection 2 mg  2 mg Intramuscular Q6H PRN Marrion Coy, MD   2 mg at 07/11/22 1628   lactulose (CHRONULAC) 10 GM/15ML solution 20 g  20 g Oral BID PRN Marrion Coy, MD       megestrol (MEGACE) 400 MG/10ML suspension 400 mg  400 mg Oral Daily Marrion Coy,  MD   400 mg at 07/11/22 9371   melatonin tablet 10 mg  10 mg Oral QHS Gabriel Cirri F, NP   10 mg at 07/10/22 2149   mirtazapine (REMERON) tablet 15 mg  15 mg Oral QHS Vanetta Mulders, NP   15 mg at 07/10/22 2149   multivitamin with minerals tablet 1 tablet  1 tablet Oral Daily Marrion Coy, MD   1 tablet at 07/11/22 0904   ondansetron (ZOFRAN) injection 4 mg  4 mg Intravenous Q6H PRN Darlin Drop, DO       Oral care mouth rinse  15 mL Mouth Rinse PRN Marrion Coy, MD       polyethylene glycol (MIRALAX / GLYCOLAX) packet 17 g  17 g Oral BID Marrion Coy, MD   17 g at 07/11/22 1413   QUEtiapine (SEROQUEL) tablet 50 mg  50 mg Oral QHS Marrion Coy, MD   50 mg at 07/10/22 2149   risperiDONE (RISPERDAL M-TABS) disintegrating tablet 0.5 mg  0.5 mg Oral BID June Vacha, Jackquline Denmark, MD       senna-docusate (Senokot-S) tablet 2 tablet  2 tablet Oral BID Marrion Coy, MD   2 tablet at 07/11/22 1413    Musculoskeletal: Strength & Muscle Tone: within normal limits Gait & Station: unable to stand Patient leans: N/A            Psychiatric Specialty Exam:  Presentation  General Appearance: Appropriate for Environment  Eye Contact:Good  Speech:Clear and Coherent  Speech Volume:Normal  Handedness:Right   Mood and Affect  Mood:Euthymic  Affect:Congruent   Thought Process  Thought Processes:Disorganized  Descriptions of Associations:Tangential  Orientation:None  Thought Content:-- (Dementia)  History of  Schizophrenia/Schizoaffective disorder:No  Duration of Psychotic Symptoms:No data recorded Hallucinations:No data recorded Ideas of Reference:None  Suicidal Thoughts:No data recorded Homicidal Thoughts:No data recorded  Sensorium  Memory:Recent Poor; Remote Poor; Immediate Poor  Judgment:Impaired  Insight:Lacking   Executive Functions  Concentration:Poor  Attention Span:Poor  Recall:Poor  Fund of Knowledge:Poor  Language:Poor   Psychomotor Activity  Psychomotor Activity:No data recorded  Assets  Assets:Physical Health; Resilience; Social Support; Financial Resources/Insurance   Sleep  Sleep:No data recorded  Physical Exam: Physical Exam Constitutional:      Appearance: Normal appearance.  HENT:     Head: Normocephalic and atraumatic.     Mouth/Throat:     Pharynx: Oropharynx is clear.  Eyes:     Pupils: Pupils are equal, round, and reactive to light.  Cardiovascular:     Rate and Rhythm: Normal rate and regular rhythm.  Pulmonary:     Effort: Pulmonary effort is normal.     Breath sounds: Normal breath sounds.  Abdominal:     General: Abdomen is flat.     Palpations: Abdomen is soft.  Musculoskeletal:        General: Normal range of motion.  Skin:    General: Skin is warm and dry.  Neurological:     General: No focal deficit present.     Mental Status: Mental status is at baseline.  Psychiatric:        Attention and Perception: He is inattentive.        Mood and Affect: Affect is blunt.        Thought Content: Thought content normal.    Review of Systems  Unable to perform ROS: Dementia   Blood pressure (!) 141/61, pulse (!) 59, temperature 97.7 F (36.5 C), temperature source Axillary, resp. rate 18, weight 82 kg, SpO2 98 %.  Body mass index is 23.21 kg/m.  Treatment Plan Summary: Medication management and Plan reviewed medication.  Not able to take IV right now because he keeps pulling out his lines.  I am going to suggest we try putting  him on small standing doses of antipsychotic Risperdal half milligram twice a day leaving the rest of the PRNs in place.  Goal would be to try and keep him from getting agitated and fighting during the day without making him so sedated that he is unable to eat.  We will follow as needed.  Disposition: Patient does not meet criteria for psychiatric inpatient admission.  Alethia Berthold, MD 07/11/2022 6:00 PM

## 2022-07-12 DIAGNOSIS — I10 Essential (primary) hypertension: Secondary | ICD-10-CM

## 2022-07-12 DIAGNOSIS — R338 Other retention of urine: Secondary | ICD-10-CM | POA: Diagnosis not present

## 2022-07-12 DIAGNOSIS — F03918 Unspecified dementia, unspecified severity, with other behavioral disturbance: Secondary | ICD-10-CM | POA: Diagnosis not present

## 2022-07-12 DIAGNOSIS — R627 Adult failure to thrive: Secondary | ICD-10-CM | POA: Diagnosis not present

## 2022-07-12 MED ORDER — ZIPRASIDONE MESYLATE 20 MG IM SOLR
10.0000 mg | Freq: Once | INTRAMUSCULAR | Status: AC
  Administered 2022-07-12: 10 mg via INTRAMUSCULAR
  Filled 2022-07-12: qty 20

## 2022-07-12 NOTE — Assessment & Plan Note (Signed)
Continue Norvasc

## 2022-07-12 NOTE — TOC Progression Note (Signed)
Transition of Care [facility]) - Progression Note    Patient Details  Name: Larry Buckley MRN: [id number] Date of Birth: 12-Feb-1941  [facility] CM/SW Contact  Beverly Sessions, RN Phone Number: 07/12/2022, 3:13 PM  Clinical Narrative:     Psych recs from yesterdays note Secure emailed to [facility] with DSS No bed offers from the memory care units I sent out in the hub to yesterday   Expected Discharge Plan: Memory Care Barriers to Discharge: Other (must enter comment) (Facility will not accept patient back)  Expected Discharge Plan and Services Expected Discharge Plan: Memory Care   Discharge Planning Services: CM Consult   Living arrangements for the past 2 months: Group Home                 DME Arranged: N/A DME Agency: NA       HH Arranged: NA HH Agency: NA         Social Determinants of Health (SDOH) Interventions    Readmission Risk Interventions     No data to display

## 2022-07-12 NOTE — Assessment & Plan Note (Signed)
Complicates overall prognosis. 

## 2022-07-12 NOTE — Assessment & Plan Note (Signed)
Monitor

## 2022-07-12 NOTE — Progress Notes (Signed)
Mobility Specialist - Progress Note    07/12/22 1535  Mobility  Activity Transferred from chair to bed  Level of Assistance Maximum assist, patient does 25-49%  Assistive Device Front wheel walker  Distance Ambulated (ft) 4 ft  Activity Response Tolerated well  $Mobility charge 1 Mobility   MS responding to chair alarm. Pt found sitting/ slumped on the edge of chair upon entry, utilizing RA. Pt difficulty scooting into the chair, MaxA to return properly to chair. Pt STS to RW MaxA +2 to this date. Pt given multi cueing to fix posterior lean and shuffle gait, extra time given to complete task. Pt transferred to bed, left supine with alarm set and needs within reach.   Candie Mile Mobility Specialist 07/12/22 4:05 PM

## 2022-07-12 NOTE — Assessment & Plan Note (Signed)
Appreciate psych input.  Continue Depakote, Remeron, Seroquel, Risperdal and as needed Haldol 

## 2022-07-12 NOTE — Progress Notes (Signed)
  Progress Note   Patient: Larry Buckley OXB:[id number] DOB: 29-May-1941 DOA: 07/07/2022     2 DOS: the patient was seen and examined on 07/12/2022   Brief hospital course: Larry Buckley is a 81 y.o. male with medical history significant for dementia with behavioral disturbance, hypertension, hyperlipidemia, AAA with repair, gout, who initially presented to [facility] ED after aggressive and combative behavior at the group home.  He was brought into the ED on 07/08/2022 via EMS under IVC.  He has been staying in the ED, seen by psychiatry and social worker, awaiting placement.  Admitted to hospitalist service on 9/19 as patient has increased weakness. Patient has significant agitation, barely eating.  Perative care consult obtained, currently working with DSS for goal of care.   9/27: Was agitated earlier required Geodon.  TOC working on placement   Assessment and Plan: * Dementia with behavioral disturbance ([place]) Appreciate psych input.  Continue Depakote, Remeron, Seroquel, Risperdal and as needed Haldol  Essential hypertension Continue Norvasc  Central sleep apnea Monitor  Protein-calorie malnutrition, severe Complicates overall prognosis  Failure to thrive in adult Complicates overall prognosis.  Palliative care and TOC team is working with DSS for placement and goals of care        Subjective: Sitting in the chair.  Pleasantly confused  Physical Exam: Vitals:   07/11/22 2305 07/12/22 0643 07/12/22 0900 07/12/22 1537  BP: (!) 151/62 122/63 134/69 (!) 106/58  Pulse: (!) 57 62 62 (!) 57  Resp: 16 16 20 18   Temp: 98.5 F (36.9 C) 99.2 F (37.3 C) 98.4 F (36.9 C) 98.3 F (36.8 C)  TempSrc:  Oral  Oral  SpO2: 100% 100% 98% 100%  Weight:       General exam: Ill-appearing, severely malnourished. Respiratory system: Clear to auscultation. Respiratory effort normal. Cardiovascular system: S1 & S2 heard, RRR. No JVD, murmurs, rubs, gallops or clicks. No pedal  edema. Gastrointestinal system: Abdomen is nondistended, soft and nontender. No organomegaly or masses felt. Normal bowel sounds heard. Central nervous system: Drowsy and confused. Extremities: Symmetric 5 x 5 power. Skin: No rashes, lesions or ulcers  Data Reviewed:  There are no new results to review at this time.  Family Communication: None  Disposition: Status is: Inpatient Remains inpatient appropriate because: Waiting for placement   Planned Discharge Destination: Skilled nursing facility    DVT prophylaxis-Lovenox Time spent: 35 minutes  Author: Max Sane, MD 07/12/2022 5:17 PM  For on call review [email or web address].

## 2022-07-12 NOTE — Assessment & Plan Note (Signed)
Complicates overall prognosis.  Palliative care and TOC team is working with DSS for placement and goals of care 

## 2022-07-12 NOTE — Progress Notes (Signed)
Nutrition Follow Up Note   DOCUMENTATION CODES:   Severe malnutrition in context of social or environmental circumstances  INTERVENTION:   Ensure Enlive po TID, each supplement provides 350 kcal and 20 grams of protein.  Magic cup TID with meals, each supplement provides 290 kcal and 9 grams of protein  MVI po daily   Dysphagia 3 diet   Pt at high refeed risk; recommend monitor potassium, magnesium and phosphorus labs daily until stable  Assist with meals   NUTRITION DIAGNOSIS:   Severe Malnutrition related to social / environmental circumstances as evidenced by severe fat depletion, severe muscle depletion, 8 percent weight loss in 2 months.  GOAL:   Patient will meet greater than or equal to 90% of their needs -not met   MONITOR:   PO intake, Supplement acceptance, Labs, Weight trends, Skin, I & O's  ASSESSMENT:   81 y.o. male with medical history significant for dementia with behavioral disturbance, hypertension, hyperlipidemia, AAA with repair and gout who is admitted after aggressive and combative behavior at the group home.  Pt with poor appetite and oral intake in hospital; pt eating anywhere from sips/bites to 100% of meals but mostly eats sips/bites. Pt is being offered Ensure drinks and supplements on meal trays. Pt on multiple appetite stimulants. Pt remains at high refeed risk. Per chart, pt is down 5lbs since admission. No new weight since 9/21; will request weekly weights. Palliative care following for GOC. NGT placement is not appropriate given pt's h/o dementia and agitation. G-tube placement could be considered but would not improve patients quality of life as pt with advanced dementia. Placement search ongoing for memory care unit.   Medications reviewed and include: aspirin, lovenox, megace, melatonin, remeron  Labs reviewed:   Diet Order:   Diet Order             DIET DYS 3 Room service appropriate? No; Fluid consistency: Thin  Diet effective now                   EDUCATION NEEDS:   No education needs have been identified at this time  Skin:  Skin Assessment: Reviewed RN Assessment  Last BM:  9/20  Height:   Ht Readings from Last 1 Encounters:  06/08/22 '6\' 2"'  (1.88 m)    Weight:   Wt Readings from Last 1 Encounters:  07/06/22 82 kg    Ideal Body Weight:  86.3 kg  BMI:  Body mass index is 23.21 kg/m.  Estimated Nutritional Needs:   Kcal:  2100-2400kcal/day  Protein:  105-120g/day  Fluid:  2.1-2.4L/day  Koleen Distance MS, RD, LDN Please refer to [facility] for RD and/or RD on-call/weekend/after hours pager

## 2022-07-12 NOTE — Progress Notes (Signed)
Mobility Specialist - Progress Note   07/12/22 1100  Mobility  Activity Ambulated with assistance in hallway;Transferred from bed to chair  Level of Assistance Maximum assist, patient does 25-49%  Assistive Device Front wheel walker  Distance Ambulated (ft) 35 ft  Activity Response Tolerated well  $Mobility charge 1 Mobility     Pt lying in bed upon arrival, utilizing RA. NT at bedside. Pt agreeable to activity. Pt able to come into sitting EOB with modA. Confused and follows single-step intermittently. MAX redirection to tasks. Pt does get agitated and attempts to hit author as she tries to prevent pt from pulling out catheter. Multimodal cueing for hand placement and min-modA STS. Assist to navigate RW. Pt with increased flexed posture throughout activity despite multiple attempts to correct. Seated break after ~30' and pt wheeled back to room; left in recliner with alarm set, needs in reach.    Kathee Delton Mobility Specialist 07/12/22, 12:09 PM

## 2022-07-13 DIAGNOSIS — Z7189 Other specified counseling: Secondary | ICD-10-CM | POA: Diagnosis not present

## 2022-07-13 DIAGNOSIS — R338 Other retention of urine: Secondary | ICD-10-CM | POA: Diagnosis not present

## 2022-07-13 DIAGNOSIS — I10 Essential (primary) hypertension: Secondary | ICD-10-CM | POA: Diagnosis not present

## 2022-07-13 DIAGNOSIS — R630 Anorexia: Secondary | ICD-10-CM | POA: Diagnosis not present

## 2022-07-13 DIAGNOSIS — F03918 Unspecified dementia, unspecified severity, with other behavioral disturbance: Secondary | ICD-10-CM | POA: Diagnosis not present

## 2022-07-13 NOTE — Plan of Care (Signed)
  Problem: Clinical Measurements: Goal: Ability to maintain clinical measurements within normal limits will improve Outcome: Progressing   

## 2022-07-13 NOTE — Progress Notes (Signed)
Physical Therapy Treatment Patient Details Name: Larry Buckley MRN: [id number] DOB: 11/21/1940 Today's Date: 07/13/2022   History of Present Illness Pt admitted for dementia iwth behavior disturbance. Pt from family care home and is poor historian.    PT Comments    Session performed this date, pt sleeping upon arrival. Pt easily awakens and followed commands appropriately this date. Noted soiled in bed, assist with changing bed linens, gown, and hygiene. Additional person in room, however not needed for physical assist. Pt on 3L of O2 upon arrival, however decreases to 87% with exertion on RA. Returned to 3L with sats at 96%. HR increased to 127 with exertion. Will continue physical therapy while admitted. Would recommend memory care/LTC with HHPT, not appropriate for SNF at this time as pt waxes/wanes according to cognition/behavior. Will add OT services to assist with care.   Recommendations for follow up therapy are one component of a multi-disciplinary discharge planning process, led by the attending physician.  Recommendations may be updated based on patient status, additional functional criteria and insurance authorization.  Follow Up Recommendations  Home health PT (at memory care unit)     Assistance Recommended at Discharge Frequent or constant Supervision/Assistance  Patient can return home with the following A lot of help with walking and/or transfers;A lot of help with bathing/dressing/bathroom;Help with stairs or ramp for entrance   Equipment Recommendations  Rolling walker (2 wheels);BSC/3in1    Recommendations for Other Services       Precautions / Restrictions Precautions Precautions: Fall Restrictions Weight Bearing Restrictions: No     Mobility  Bed Mobility Overal bed mobility: Needs Assistance Bed Mobility: Rolling, Supine to Sit Rolling: Min assist   Supine to sit: Mod assist     General bed mobility comments: able to roll to B sides and perform bed  mobility with assist. Once seated at EOB, upright postur enoted    Transfers Overall transfer level: Needs assistance Equipment used: Rolling walker (2 wheels) Transfers: Sit to/from Stand, Bed to chair/wheelchair/BSC Sit to Stand: Mod assist           General transfer comment: bed slightly elevated for ease of transfer. RW used and mod assist of 1. Additional person in room to assist for bed linen change. All mobility performed on RA with sats decreasing to 87%.    Ambulation/Gait Ambulation/Gait assistance: Min assist Gait Distance (Feet): 50 Feet Assistive device: Rolling walker (2 wheels) Gait Pattern/deviations: Step-through pattern       General Gait Details: needs min assist for steering and progression of RW. Has difficulty with turns often coming outside of BOS. Short reciprocal gait pattern noted. 1 standing rest break given. Fatigues with increased exertion   Stairs             Wheelchair Mobility    Modified Rankin (Stroke Patients Only)       Balance Overall balance assessment: Needs assistance Sitting-balance support: Feet supported Sitting balance-Leahy Scale: Good     Standing balance support: Bilateral upper extremity supported Standing balance-Leahy Scale: Fair                              Cognition Arousal/Alertness: Awake/alert Behavior During Therapy: Flat affect Overall Cognitive Status: History of cognitive impairments - at baseline  General Comments: initially sleeping, however easily awakens and follows commands. Very flat affect and confused. Limited verbal interaction        Exercises Other Exercises Other Exercises: pt expresses hunger with breakfast tray untouched. Assisted pt with sitting in recliner, opening food, and getting patient ready to eat. Other Exercises: pt noted to be soiled upon arrival. Able to roll to B sides with max assist for hygiene.    General  Comments        Pertinent Vitals/Pain Pain Assessment Pain Assessment: No/denies pain    Home Living                          Prior Function            PT Goals (current goals can now be found in the care plan section) Acute Rehab PT Goals Patient Stated Goal: unable to state PT Goal Formulation: Patient unable to participate in goal setting Time For Goal Achievement: 07/16/22 Potential to Achieve Goals: Fair Progress towards PT goals: Progressing toward goals    Frequency    Min 2X/week      PT Plan Current plan remains appropriate    Co-evaluation              AM-PAC PT "6 Clicks" Mobility   Outcome Measure  Help needed turning from your back to your side while in a flat bed without using bedrails?: A Little Help needed moving from lying on your back to sitting on the side of a flat bed without using bedrails?: A Little Help needed moving to and from a bed to a chair (including a wheelchair)?: A Lot Help needed standing up from a chair using your arms (e.g., wheelchair or bedside chair)?: A Lot Help needed to walk in hospital room?: A Little Help needed climbing 3-5 steps with a railing? : A Lot 6 Click Score: 15    End of Session Equipment Utilized During Treatment: Gait belt Activity Tolerance: Patient tolerated treatment well Patient left: in chair;with chair alarm set Nurse Communication: Mobility status PT Visit Diagnosis: Unsteadiness on feet (R26.81);Muscle weakness (generalized) (M62.81);Difficulty in walking, not elsewhere classified (R26.2)     Time: 8938-1017 PT Time Calculation (min) (ACUTE ONLY): 23 min  Charges:  $Gait Training: 8-22 mins $Therapeutic Activity: 8-22 mins                     Elizabeth Palau, PT, DPT, GCS [phone]    [name] 07/13/2022, 11:44 AM

## 2022-07-13 NOTE — Assessment & Plan Note (Signed)
Appreciate psych input.  Continue Depakote, Remeron, Seroquel, Risperdal and as needed Haldol

## 2022-07-13 NOTE — Assessment & Plan Note (Signed)
Monitor

## 2022-07-13 NOTE — Evaluation (Signed)
Occupational Therapy Evaluation Patient Details Name: Larry Buckley MRN: [id number] DOB: 09-Mar-1941 Today's Date: 07/13/2022   History of Present Illness Pt admitted for dementia iwth behavior disturbance. Pt from family care home and is poor historian.   Clinical Impression   Upon entering the room, pt supine in bed with no c/o pain this session. Pt with flat affect and needing max multimodal cuing throughout session. Pt is very slow to process information and needing heavy multimodal cuing for supine >sit with supervision. Pt stands with mod A and use of RW. Pt needing total A for peri hygiene while in standing. Pt stands for several minutes and is unable to motor plan taking any steps or movement of any kind. Pt needing manual facilitation at hip to return to seated position. Max A for sit >supine as pt is unable to initiate returning to bed. Pt is immediately asleep once returning to bed and B mitts donned. Pt is able to able with staff earlier in the day. This therapist also saw pt ~ 10 days ago with pt presenting in much the same way. He is likely close to baseline and limited secondary to cognition. OT recommends LTC (memory care unit). OT to complete order at this time.      Recommendations for follow up therapy are one component of a multi-disciplinary discharge planning process, led by the attending physician.  Recommendations may be updated based on patient status, additional functional criteria and insurance authorization.   Follow Up Recommendations  Long-term institutional care without follow-up therapy    Assistance Recommended at Discharge Frequent or constant Supervision/Assistance  Patient can return home with the following A lot of help with walking and/or transfers;Assist for transportation;Direct supervision/assist for medications management;Direct supervision/assist for financial management;Assistance with cooking/housework;A lot of help with bathing/dressing/bathroom     Functional Status Assessment  Patient has had a recent decline in their functional status and/or demonstrates limited ability to make significant improvements in function in a reasonable and predictable amount of time  Equipment Recommendations  Other (comment) (defer to next venue of care)       Precautions / Restrictions Precautions Precautions: Fall      Mobility Bed Mobility Overal bed mobility: Needs Assistance Bed Mobility: Supine to Sit, Sit to Supine Rolling: Supervision   Supine to sit: Supervision Sit to supine: Max assist        Transfers Overall transfer level: Needs assistance Equipment used: Rolling walker (2 wheels) Transfers: Sit to/from Stand Sit to Stand: Min assist                  Balance Overall balance assessment: Needs assistance Sitting-balance support: Feet supported Sitting balance-Leahy Scale: Good     Standing balance support: Bilateral upper extremity supported, Reliant on assistive device for balance Standing balance-Leahy Scale: Fair                             ADL either performed or assessed with clinical judgement   ADL Overall ADL's : Needs assistance/impaired                                       General ADL Comments: Pt waxes and wanes with level of assistance he needs depending on his ability to process, sequencing, and follow commands.     Vision Patient Visual Report: No change from baseline  Pertinent Vitals/Pain Pain Assessment Pain Assessment: Faces Faces Pain Scale: No hurt        Extremity/Trunk Assessment Upper Extremity Assessment Upper Extremity Assessment: Overall WFL for tasks assessed   Lower Extremity Assessment Lower Extremity Assessment: Overall WFL for tasks assessed       Communication Communication Communication: No difficulties   Cognition Arousal/Alertness: Awake/alert Behavior During Therapy: Flat affect Overall Cognitive Status:  History of cognitive impairments - at baseline                                 General Comments: Unable to follow commands during session and has flat affect during session.                Home Living Family/patient expects to be discharged to:: Group home                                 Additional Comments: pt is poor historian, per chart pt from family care home, however are unwilling to let him return due to agitation      Prior Functioning/Environment Prior Level of Function : Independent/Modified Independent;Driving             Mobility Comments: using RW                   OT Goals(Current goals can be found in the care plan section) Acute Rehab OT Goals Patient Stated Goal: none stated OT Goal Formulation: Patient unable to participate in goal setting Time For Goal Achievement: 07/13/22 Potential to Achieve Goals: Poor  OT Frequency:         AM-PAC OT "6 Clicks" Daily Activity     Outcome Measure Help from another person eating meals?: A Little Help from another person taking care of personal grooming?: A Little Help from another person toileting, which includes using toliet, bedpan, or urinal?: A Lot Help from another person bathing (including washing, rinsing, drying)?: A Lot Help from another person to put on and taking off regular upper body clothing?: A Little Help from another person to put on and taking off regular lower body clothing?: A Lot 6 Click Score: 15   End of Session Equipment Utilized During Treatment: Rolling walker (2 wheels) Nurse Communication: Mobility status  Activity Tolerance: Other (comment) (limited by cognition) Patient left: in bed;with call bell/phone within reach;with bed alarm set                   Time: 6269-4854 OT Time Calculation (min): 31 min Charges:  OT General Charges $OT Visit: 1 Visit OT Evaluation $OT Eval Moderate Complexity: 1 Mod OT Treatments $Self Care/Home Management  : 8-22 mins  Darleen Crocker, MS, OTR/L , CBIS ascom [phone]  07/13/22, 4:16 PM

## 2022-07-13 NOTE — Assessment & Plan Note (Signed)
Complicates overall prognosis. 

## 2022-07-13 NOTE — Progress Notes (Signed)
Mobility Specialist - Progress Note    07/13/22 1214  Mobility  Activity Ambulated with assistance in hallway;Ambulated with assistance in room;Transferred from chair to bed  Level of Assistance Contact guard assist, steadying assist  Assistive Device Front wheel walker  Distance Ambulated (ft) 80 ft  Activity Response Tolerated well  $Mobility charge 1 Mobility   MS responding pt standing in room without bed alarm going off. Pt standing upon entry utilizing RA, RN present. Pt ambulated 80 ft in hallway with RW CGA, with three standing rest breaks during ambulation. Assist to navigate RW ,Max redirection during task. Pt seemed agitated and attempts to hit MS while getting back into bed. Multi cueing for hand placement and Mod-MaxA STS, and return to bed. Pt left supine with gloves on and alarm set with needs within reach.   Candie Mile Mobility Specialist 07/13/22 12:24 PM

## 2022-07-13 NOTE — Progress Notes (Addendum)
Daily Progress Note   Patient Name: Larry Buckley       Date: 07/13/2022 DOB: 1941/05/21  Age: 81 y.o. MRN#: [id number] Attending Physician: Delfino Lovett, MD Primary Care Physician: Marjie Skiff, NP Admit Date: 06/22/2022  Reason for Consultation/Follow-up: Establishing goals of care  Subjective: Notes reviewed. Patient is sitting in bed at this time. Staff is working on placement. Previous discussion had with DSS regarding status, and recommendations made. Discussed increasing evening Risperdal to help with sleep/wake cycle and agitation, or initiation of Zyprexa. Alternatively, discussed psychiatry re-engagement for behavior from dementia. A MOST form was initiated as per recommendations made at the time of discussion with DSS. I have not been able to speak with DSS since initial conversation.  The form has not been signed by DSS.   Would recommend outpatient palliative to follow at D/C.    Length of Stay: 3  Current Medications: Scheduled Meds:   amLODipine  5 mg Oral Daily   aspirin EC  81 mg Oral Daily   atorvastatin  20 mg Oral QHS   Chlorhexidine Gluconate Cloth  6 each Topical Daily   divalproex  500 mg Oral BID   enoxaparin (LOVENOX) injection  40 mg Subcutaneous Q24H   feeding supplement  237 mL Oral TID BM   megestrol  400 mg Oral Daily   melatonin  10 mg Oral QHS   mirtazapine  15 mg Oral QHS   multivitamin with minerals  1 tablet Oral Daily   polyethylene glycol  17 g Oral BID   QUEtiapine  50 mg Oral QHS   risperiDONE  0.5 mg Oral BID   senna-docusate  2 tablet Oral BID    Continuous Infusions:   PRN Meds: acetaminophen, haloperidol **OR** haloperidol lactate, lactulose, ondansetron (ZOFRAN) IV, mouth rinse  Physical Exam Pulmonary:     Effort: Pulmonary  effort is normal.  Neurological:     Mental Status: He is alert.             Vital Signs: BP (!) 128/54 (BP Location: Left Arm)   Pulse (!) 55   Temp 98.5 F (36.9 C) (Oral)   Resp 20   Wt 82 kg   SpO2 95%   BMI 23.21 kg/m  SpO2: SpO2: 95 % O2 Device: O2 Device: Nasal Cannula O2 Flow Rate:  O2 Flow Rate (L/min): 3 L/min  Intake/output summary:  Intake/Output Summary (Last 24 hours) at 07/13/2022 1554 Last data filed at 07/13/2022 0413 Gross per 24 hour  Intake 0 ml  Output 1150 ml  Net -1150 ml   LBM: Last BM Date : 07/05/22 Baseline Weight: Weight: 84 kg (historic weight. Please update during this admission.) Most recent weight: Weight: 82 kg      Patient Active Problem List   Diagnosis Date Noted   Central sleep apnea 07/11/2022   Acute urinary retention 07/11/2022   Protein-calorie malnutrition, severe 07/07/2022   Anorexia 07/05/2022   Failure to thrive in adult 07/04/2022   Acute pain of right shoulder 06/08/2022   Acute hip pain, left 06/08/2022   Low hemoglobin 04/26/2022   Edema of both legs 01/27/2022   Prediabetes 01/04/2022   Dementia with behavioral disturbance ([place]) 12/29/2021   History of abdominal aortic aneurysm (AAA) repair 09/25/2021   B12 deficiency 07/11/2021   Hyperlipidemia, mixed    Essential hypertension    Gout, arthropathy     Palliative Care Assessment & Plan    Recommendations/Plan: Unable to successfully reach DSS since initial conversation.  Agree with psychiatry following for med recs 2/2 behavior from dementia.  Recommend outpatient palliative to follow.   Code Status:    Code Status Orders  (From admission, onward)           Start     Ordered   07/04/22 2111  Full code  Continuous        07/04/22 2110           Code Status History     Date Active Date Inactive Code Status Order ID Comments User Context   07-09-22 1337 07/04/2022 2110 Full Code [id number]  Harvest Dark, MD ED   05/25/2022 2232  05/26/2022 0836 Full Code [id number]  Verlene Mayer, RN ED   03/30/2022 1406 04/03/2022 1950 Full Code [id number]  Harvest Dark, MD ED   12/29/2021 0041 01/11/2022 1936 Full Code [id number]  Athena Masse, MD ED       Thank you for allowing the Palliative Medicine Team to assist in the care of this patient.   Asencion Gowda, NP  Please contact Palliative Medicine Team phone at [phone] for questions and concerns.

## 2022-07-13 NOTE — Progress Notes (Signed)
  Progress Note   Patient: Larry Buckley GMW:[id number] DOB: Nov 04, 1940 DOA: 21-Jul-2022     3 DOS: the patient was seen and examined on 07/13/2022   Brief hospital course: Larry Buckley is a 81 y.o. male with medical history significant for dementia with behavioral disturbance, hypertension, hyperlipidemia, AAA with repair, gout, who initially presented to [facility] ED after aggressive and combative behavior at the group home.  He was brought into the ED on 07/21/2022 via EMS under IVC.  He has been staying in the ED, seen by psychiatry and social worker, awaiting placement.  Admitted to hospitalist service on 9/19 as patient has increased weakness. Patient has significant agitation, barely eating.  Perative care consult obtained, currently working with DSS for goal of care.   9/27: Was agitated earlier required Geodon.  9/28: TOC working on placement   Assessment and Plan: * Dementia with behavioral disturbance (Larry Buckley) Appreciate psych input.  Continue Depakote, Remeron, Seroquel, Risperdal and as needed Haldol  Essential hypertension Continue Norvasc.  Central sleep apnea Monitor.  Protein-calorie malnutrition, severe Complicates overall prognosis.  Failure to thrive in adult Complicates overall prognosis.  Palliative care and [facility] team is working with DSS for placement and goals of care.        Subjective: Sitting in the chair.  Very pleasant.  Nurse at bedside  Physical Exam: Vitals:   07/12/22 1537 07/12/22 1953 07/13/22 0412 07/13/22 0756  BP: (!) 106/58 (!) 120/58 (!) 130/51 (!) 128/54  Pulse: (!) 57 (!) 55 89 (!) 55  Resp: 18 18 18 20   Temp: 98.3 F (36.8 C) 98.5 F (36.9 C) 98.2 F (36.8 C) 98.5 F (36.9 C)  TempSrc: Oral Oral Axillary Oral  SpO2: 100% 100% 100% 95%  Weight:       General exam:Ill-appearing, severely malnourished. Respiratory system: Clear to auscultation. Respiratory effort normal. Cardiovascular system:S1 &S2 heard, RRR. No JVD, murmurs,  rubs, gallops or clicks. No pedal edema. Gastrointestinal system:Abdomen is nondistended, soft and nontender. No organomegaly or masses felt. Normal bowel sounds heard. Central nervous system:Drowsy and confused. Extremities: Symmetric 5 x 5 power. Skin: No rashes, lesions or ulcers Data Reviewed:  There are no new results to review at this time.  Family Communication: None  Disposition: Status is: Inpatient Remains inpatient appropriate because: Waiting for safe disposition   Planned Discharge Destination: Skilled nursing facility    DVT prophylaxis-Lovenox Time spent: 15 minutes  Author: Max Sane, MD 07/13/2022 1:52 PM  For on call review [email or web address].

## 2022-07-13 NOTE — Assessment & Plan Note (Signed)
Continue Norvasc

## 2022-07-13 NOTE — Assessment & Plan Note (Signed)
Complicates overall prognosis.  Palliative care and TOC team is working with DSS for placement and goals of care 

## 2022-07-14 DIAGNOSIS — R630 Anorexia: Secondary | ICD-10-CM | POA: Diagnosis not present

## 2022-07-14 DIAGNOSIS — I10 Essential (primary) hypertension: Secondary | ICD-10-CM | POA: Diagnosis not present

## 2022-07-14 DIAGNOSIS — R338 Other retention of urine: Secondary | ICD-10-CM | POA: Diagnosis not present

## 2022-07-14 DIAGNOSIS — F03918 Unspecified dementia, unspecified severity, with other behavioral disturbance: Secondary | ICD-10-CM | POA: Diagnosis not present

## 2022-07-14 LAB — BASIC METABOLIC PANEL
Anion gap: 6 (ref 5–15)
BUN: 19 mg/dL (ref 8–23)
CO2: 23 mmol/L (ref 22–32)
Calcium: 9.1 mg/dL (ref 8.9–10.3)
Chloride: 110 mmol/L (ref 98–111)
Creatinine, Ser: 0.81 mg/dL (ref 0.61–1.24)
GFR, Estimated: >60 mL/min (ref >60)
Glucose, Bld: 94 mg/dL (ref 70–99)
Potassium: 4 mmol/L (ref 3.5–5.1)
Sodium: 139 mmol/L (ref 135–145)

## 2022-07-14 LAB — CBC
HCT: 39.9 % (ref 39.0–52.0)
Hemoglobin: 13.1 g/dL (ref 13.0–17.0)
MCH: 30.8 pg (ref 26.0–34.0)
MCHC: 32.8 g/dL (ref 30.0–36.0)
MCV: 93.9 fL (ref 80.0–100.0)
Platelets: 347 10*3/uL (ref 150–400)
RBC: 4.25 MIL/uL (ref 4.22–5.81)
RDW: 14 % (ref 11.5–15.5)
WBC: 4.7 10*3/uL (ref 4.0–10.5)
nRBC: 0 % (ref 0.0–0.2)

## 2022-07-14 MED ORDER — QUETIAPINE FUMARATE 100 MG PO TABS
100.0000 mg | ORAL_TABLET | Freq: Every day | ORAL | Status: DC
  Administered 2022-07-14 – 2022-10-01 (×80): 100 mg via ORAL
  Filled 2022-07-14 (×81): qty 4

## 2022-07-14 MED ORDER — RISPERIDONE 1 MG PO TBDP
1.0000 mg | ORAL_TABLET | Freq: Two times a day (BID) | ORAL | Status: DC
  Administered 2022-07-14 – 2022-09-09 (×110): 1 mg via ORAL
  Filled 2022-07-14 (×117): qty 1

## 2022-07-14 MED ORDER — ZIPRASIDONE MESYLATE 20 MG IM SOLR
10.0000 mg | Freq: Once | INTRAMUSCULAR | Status: AC
  Administered 2022-07-14: 10 mg via INTRAMUSCULAR
  Filled 2022-07-14: qty 20

## 2022-07-14 NOTE — Assessment & Plan Note (Signed)
Complicates overall prognosis.  Palliative care and TOC team is working with DSS for placement and goals of care 

## 2022-07-14 NOTE — Progress Notes (Signed)
  Progress Note   Patient: Larry Buckley CVE:[id number] DOB: 1941-02-12 DOA: 18-Jul-2022     4 DOS: the patient was seen and examined on 07/14/2022   Brief hospital course: Larry Buckley is a 81 y.o. male with medical history significant for dementia with behavioral disturbance, hypertension, hyperlipidemia, AAA with repair, gout, who initially presented to [facility] ED after aggressive and combative behavior at the group home.  He was brought into the ED on 07/18/22 via EMS under IVC.  He has been staying in the ED, seen by psychiatry and social worker, awaiting placement.  Admitted to hospitalist service on 9/19 as patient has increased weakness. Patient has significant agitation, barely eating.  Perative care consult obtained, currently working with DSS for goal of care.   9/27: Was agitated earlier required Geodon.  9/28: TOC working on placement 9/29: Required one-time Geodon this afternoon for agitation.  Psychiatry following and increased his dose of Risperdal and Seroquel today   Assessment and Plan: * Dementia with behavioral disturbance ([place]) Appreciate psych input.  Continue Depakote, Remeron, Seroquel, Risperdal and as needed Haldol.  Adjusted and increase the dose of Risperdal and Seroquel today by psych  Essential hypertension Continue Norvasc  Central sleep apnea Monitor  Protein-calorie malnutrition, severe Complicates overall prognosis  Failure to thrive in adult Complicates overall prognosis.  Palliative care and TOC team is working with DSS for placement and goals of care        Subjective: Sitting in the chair and was interactive.  Was eating his meal  Physical Exam: Vitals:   07/13/22 1947 07/14/22 0613 07/14/22 0830 07/14/22 0850  BP: (!) 140/69 (!) 147/69 (!) 148/84 133/66  Pulse: 67 97  76  Resp: 20 20 20 20   Temp: 99.5 F (37.5 C) 98.2 F (36.8 C) 97.7 F (36.5 C) 98.3 F (36.8 C)  TempSrc: Oral Oral Oral Oral  SpO2: 98% 92%  97%  Weight:        General exam:Ill-appearing, severely malnourished. Respiratory system: Clear to auscultation. Respiratory effort normal. Cardiovascular system:S1 &S2 heard, RRR. No JVD, murmurs, rubs, gallops or clicks. No pedal edema. Gastrointestinal system:Abdomen is nondistended, soft and nontender. No organomegaly or masses felt. Normal bowel sounds heard. Central nervous system:Drowsy and confused. Extremities: Symmetric 5 x 5 power. Skin: No rashes, lesions or ulcers Data Reviewed:  There are no new results to review at this time.  Family Communication: None  Disposition: Status is: Inpatient Remains inpatient appropriate because: Waiting for safe disposition   Planned Discharge Destination: Skilled nursing facility    DVT prophylaxis-Lovenox Time spent: 25 minutes  Author: Max Sane, MD 07/14/2022 6:58 PM  For on call review [email or web address].

## 2022-07-14 NOTE — Assessment & Plan Note (Signed)
Complicates overall prognosis. 

## 2022-07-14 NOTE — Consult Note (Signed)
[facility] Face-to-Face Psychiatry Consult   Reason for Consult: Consult follow-up with his 81 year old man with dementia Referring Physician:  Manuella Ghazi Patient Identification: Larry Buckley MRN:  [id number] Principal Diagnosis: Dementia with behavioral disturbance [facility]) Diagnosis:  Principal Problem:   Dementia with behavioral disturbance ([place]) Active Problems:   Essential hypertension   Failure to thrive in adult   Anorexia   Protein-calorie malnutrition, severe   Central sleep apnea   Acute urinary retention   Total Time spent with patient: 30 minutes  Subjective:   Larry Buckley is a 81 y.o. male patient admitted with patient unable to give information.  HPI: Chart reviewed.  Patient seen.  81 year old man with a history of advanced dementia who has been in the hospital awaiting placement.  Patient continues to have intermittent agitation particularly when nurses try and perform any sort of procedure that is uncomfortable for him.  He will still try to strike out at people under those circumstances.  Otherwise he also has extended times of being sleepy and unresponsive during the day and has a hard time cooperating with any therapy.  The full cycle of his behavior through the day is not clear to me but there is some notes that suggest that he is having agitation at nighttime still.  Patient is on 50 mg of Seroquel at night and 1/2 mg of Risperdal twice a day and has as needed medicine  Past Psychiatric History: Severe dementia  Risk to Self:   Risk to Others:   Prior Inpatient Therapy:   Prior Outpatient Therapy:    Past Medical History:  Past Medical History:  Diagnosis Date   AAA (abdominal aortic aneurysm) ([place])    Dementia ([place]) 05/21/2020   Gout, arthropathy    Hyperlipidemia    Hypertension     Past Surgical History:  Procedure Laterality Date   PERIPHERAL VASCULAR CATHETERIZATION N/A 02/08/2016   Procedure: Endovascular Repair/Stent Graft;  Surgeon: Algernon Huxley, MD;   Location: [place] [facility];  Service: Cardiovascular;  Laterality: N/A;   Family History:  Family History  Problem Relation Age of Onset   Cancer Mother    Diabetes Mother    Heart disease Father    Hypertension Father    Heart disease Brother    Family Psychiatric  History: See previous Social History:  Social History   Substance and Sexual Activity  Alcohol Use Yes     Social History   Substance and Sexual Activity  Drug Use No    Social History   Socioeconomic History   Marital status: Widowed    Spouse name: Not on file   Number of children: Not on file   Years of education: Not on file   Highest education level: Not on file  Occupational History   Not on file  Tobacco Use   Smoking status: Former   Smokeless tobacco: Never  Substance and Sexual Activity   Alcohol use: Yes   Drug use: No   Sexual activity: Not Currently  Other Topics Concern   Not on file  Social History Narrative   Not on file   Social Determinants of Health   Financial Resource Strain: Low Risk  (05/09/2022)   Overall Financial Resource Strain (CARDIA)    Difficulty of Paying Living Expenses: Not hard at all  Food Insecurity: No Food Insecurity (05/09/2022)   Hunger Vital Sign    Worried About Running Out of Food in the Last Year: Never true    Ran Out  of Food in the Last Year: Never true  Transportation Needs: No Transportation Needs (05/09/2022)   PRAPARE - Hydrologist (Medical): No    Lack of Transportation (Non-Medical): No  Physical Activity: Inactive (05/09/2022)   Exercise Vital Sign    Days of Exercise per Week: 0 days    Minutes of Exercise per Session: 0 min  Stress: No Stress Concern Present (05/09/2022)   [place]    Feeling of Stress : Not at all  Social Connections: Socially Isolated (05/09/2022)   Social Connection and Isolation Panel [NHANES]    Frequency of  Communication with Friends and Family: Never    Frequency of Social Gatherings with Friends and Family: Never    Attends Religious Services: Never    Marine scientist or Organizations: No    Attends Archivist Meetings: Never    Marital Status: Widowed   Additional Social History:    Allergies:   Allergies  Allergen Reactions   Lotensin [Benazepril Hcl] Swelling    Labs:  Results for orders placed or performed during the hospital encounter of 06/30/2022 (from the past 48 hour(s))  CBC     Status: None   Collection Time: 07/14/22  7:46 AM  Result Value Ref Range   WBC 4.7 4.0 - 10.5 K/uL   RBC 4.25 4.22 - 5.81 MIL/uL   Hemoglobin 13.1 13.0 - 17.0 g/dL   HCT 39.9 39.0 - 52.0 %   MCV 93.9 80.0 - 100.0 fL   MCH 30.8 26.0 - 34.0 pg   MCHC 32.8 30.0 - 36.0 g/dL   RDW 14.0 11.5 - 15.5 %   Platelets 347 150 - 400 K/uL   nRBC 0.0 0.0 - 0.2 %    Comment: Performed at [facility], [address] [place], [place] [address]  Basic metabolic panel     Status: None   Collection Time: 07/14/22  7:46 AM  Result Value Ref Range   Sodium 139 135 - 145 mmol/L   Potassium 4.0 3.5 - 5.1 mmol/L   Chloride 110 98 - 111 mmol/L   CO2 23 22 - 32 mmol/L   Glucose, Bld 94 70 - 99 mg/dL    Comment: Glucose reference range applies only to samples taken after fasting for at least 8 hours.   BUN 19 8 - 23 mg/dL   Creatinine, Ser 0.81 0.61 - 1.24 mg/dL   Calcium 9.1 8.9 - 10.3 mg/dL   GFR, Estimated >60 >60 mL/min    Comment: (NOTE) Calculated using the CKD-EPI Creatinine Equation (2021)    Anion gap 6 5 - 15    Comment: Performed at [facility], [address] [place], [place] [address]    Current Facility-Administered Medications  Medication Dose Route Frequency Provider Last Rate Last Admin   acetaminophen (TYLENOL) tablet 650 mg  650 mg Oral Q6H PRN Irene Pap N, DO       amLODipine (NORVASC) tablet 5 mg  5 mg Oral Daily Waldon Merl F, NP   5 mg  at 07/14/22 2725   aspirin EC tablet 81 mg  81 mg Oral Daily Waldon Merl F, NP   81 mg at 07/14/22 3664   atorvastatin (LIPITOR) tablet 20 mg  20 mg Oral QHS Waldon Merl F, NP   20 mg at 07/13/22 2224   Chlorhexidine Gluconate Cloth 2 % PADS 6 each  6 each Topical Daily Sharen Hones,  MD   6 each at 07/13/22 1006   divalproex (DEPAKOTE) DR tablet 500 mg  500 mg Oral BID Waldon Merl F, NP   500 mg at 07/14/22 0820   enoxaparin (LOVENOX) injection 40 mg  40 mg Subcutaneous Q24H Irene Pap N, DO   40 mg at 07/13/22 2224   feeding supplement (ENSURE ENLIVE / ENSURE PLUS) liquid 237 mL  237 mL Oral TID BM Sharen Hones, MD   237 mL at 07/14/22 0829   haloperidol (HALDOL) tablet 2 mg  2 mg Oral Q6H PRN Sharen Hones, MD   2 mg at 07/14/22 [id number]   Or   haloperidol lactate (HALDOL) injection 2 mg  2 mg Intramuscular Q6H PRN Sharen Hones, MD   2 mg at 07/14/22 0134   lactulose (CHRONULAC) 10 GM/15ML solution 20 g  20 g Oral BID PRN Sharen Hones, MD       megestrol (MEGACE) 400 MG/10ML suspension 400 mg  400 mg Oral Daily Sharen Hones, MD   400 mg at 07/14/22 0820   melatonin tablet 10 mg  10 mg Oral QHS Waldon Merl F, NP   10 mg at 07/13/22 2224   mirtazapine (REMERON) tablet 15 mg  15 mg Oral QHS Waldon Merl F, NP   15 mg at 07/13/22 2224   multivitamin with minerals tablet 1 tablet  1 tablet Oral Daily Sharen Hones, MD   1 tablet at 07/14/22 0821   ondansetron (ZOFRAN) injection 4 mg  4 mg Intravenous Q6H PRN Kayleen Memos, DO       Oral care mouth rinse  15 mL Mouth Rinse PRN Sharen Hones, MD       polyethylene glycol (MIRALAX / GLYCOLAX) packet 17 g  17 g Oral BID Sharen Hones, MD   17 g at 07/14/22 0820   QUEtiapine (SEROQUEL) tablet 100 mg  100 mg Oral QHS Dailan Pfalzgraf T, MD       risperiDONE (RISPERDAL M-TABS) disintegrating tablet 1 mg  1 mg Oral BID Denym Christenberry, Madie Reno, MD       senna-docusate (Senokot-S) tablet 2 tablet  2 tablet Oral BID Sharen Hones, MD   2 tablet at  07/14/22 [id number]   ziprasidone (GEODON) injection 10 mg  10 mg Intramuscular Once Max Sane, MD        Musculoskeletal: Strength & Muscle Tone: within normal limits Gait & Station: normal Patient leans: N/A            Psychiatric Specialty Exam:  Presentation  General Appearance:  Appropriate for Environment  Eye Contact: Good  Speech: Clear and Coherent  Speech Volume: Normal  Handedness: Right   Mood and Affect  Mood: Euthymic  Affect: Congruent   Thought Process  Thought Processes: Disorganized  Descriptions of Associations:Tangential  Orientation:None  Thought Content:-- (Dementia)  History of Schizophrenia/Schizoaffective disorder:No  Duration of Psychotic Symptoms:No data recorded Hallucinations:No data recorded Ideas of Reference:None  Suicidal Thoughts:No data recorded Homicidal Thoughts:No data recorded  Sensorium  Memory: Recent Poor; Remote Poor; Immediate Poor  Judgment: Impaired  Insight: Lacking   Executive Functions  Concentration: Poor  Attention Span: Poor  Recall: Poor  Fund of Knowledge: Poor  Language: Poor   Psychomotor Activity  Psychomotor Activity:No data recorded  Assets  Assets: Physical Health; Resilience; Social Support; Financial Resources/Insurance   Sleep  Sleep:No data recorded  Physical Exam: Physical Exam Vitals and nursing note reviewed.  Constitutional:      Appearance: Normal appearance. He is ill-appearing.  HENT:  Head: Normocephalic and atraumatic.     Mouth/Throat:     Pharynx: Oropharynx is clear.  Eyes:     Pupils: Pupils are equal, round, and reactive to light.  Cardiovascular:     Rate and Rhythm: Normal rate and regular rhythm.  Pulmonary:     Effort: Pulmonary effort is normal.     Breath sounds: Normal breath sounds.  Abdominal:     General: Abdomen is flat.     Palpations: Abdomen is soft.  Musculoskeletal:        General: Normal range of  motion.  Skin:    General: Skin is warm and dry.  Neurological:     General: No focal deficit present.     Mental Status: He is alert. Mental status is at baseline.  Psychiatric:        Attention and Perception: He is inattentive.        Mood and Affect: Mood normal. Affect is flat.        Speech: He is noncommunicative.        Thought Content: Thought content normal.    Review of Systems  Unable to perform ROS: Dementia   Blood pressure 133/66, pulse 76, temperature 98.3 F (36.8 C), temperature source Oral, resp. rate 20, weight 82 kg, SpO2 97 %. Body mass index is 23.21 kg/m.  Treatment Plan Summary: Medication management and Plan 81 year old man with dementia with agitation.  Diagnosis precludes admission to the geriatric psychiatry unit.  We also do not have any beds available on geriatric psychiatry and the patient would almost certainly require a one-to-one while there which is not possible with current staffing levels.  Continue to recommend placement in the skilled nursing or what ever other level of care can be provided appropriately.  Meanwhile I will increase medicine to Risperdal 1 mg twice a day dissolving tablets and Seroquel 100 mg at night.  Risk of course is always that medicine may be over sedating and make feeding difficult or impossible.  There is not likely to be any medication that is going to make him completely cooperative because of how advanced his dementia is.  We will continue to follow as needed.  Disposition: Patient does not meet criteria for psychiatric inpatient admission. Supportive therapy provided about ongoing stressors.  Alethia Berthold, MD 07/14/2022 1:36 PM

## 2022-07-14 NOTE — Assessment & Plan Note (Signed)
Continue Norvasc

## 2022-07-14 NOTE — Assessment & Plan Note (Signed)
Appreciate psych input.  Continue Depakote, Remeron, Seroquel, Risperdal and as needed Haldol.  Adjusted and increase the dose of Risperdal and Seroquel today by psych 

## 2022-07-14 NOTE — TOC Progression Note (Signed)
Transition of Care [facility]) - Progression Note    Patient Details  Name: Larry Buckley MRN: [id number] Date of Birth: 10-26-1940  Transition of Care [facility]) CM/SW Contact  Beverly Sessions, RN Phone Number: 07/14/2022, 10:48 AM  Clinical Narrative:    No bed offers  [facility] leadership has been updated as placement is going to be difficult due to dementia and behaviors    Expected Discharge Plan: Memory Care Barriers to Discharge: Other (must enter comment) (Facility will not accept patient back)  Expected Discharge Plan and Services Expected Discharge Plan: Memory Care   Discharge Planning Services: CM Consult   Living arrangements for the past 2 months: Group Home                 DME Arranged: N/A DME Agency: NA       HH Arranged: NA HH Agency: NA         Social Determinants of Health (SDOH) Interventions    Readmission Risk Interventions     No data to display

## 2022-07-14 NOTE — Assessment & Plan Note (Signed)
Monitor

## 2022-07-14 NOTE — TOC Progression Note (Addendum)
Transition of Care [facility]) - Progression Note    Patient Details  Name: Larry Buckley MRN: [id number] Date of Birth: 27-Jun-1941  Transition of Care [facility]) CM/SW Contact  Beverly Sessions, RN Phone Number: 07/14/2022, 12:21 PM  Clinical Narrative:      Received request below via email from [facility] with DSS "After speaking with [place] about this case, would he not be considered active psychosis and that make him appropriate for inpatient treatment? Is he eligible for inpatient in a geriatric psych to adjust these medications?  Risperdal at this low dose does not seem to be working for him and the PRN's are not what facility like to see, they like to see a standing medication that is going to keep the adult a good baseline. Is the doctor willing to increase the medication and taper from there to get him to a good place that will allow a facility to assess him and be willing to take him. The facility physician will be able to make any further long term adjustments to his daily medication regimen.   We understand that the hospital is not the appropriate setting for him long term and we do not want him there long term; however, we have to think about his safety and the safety of the other residents in the facility. "  Message sent to attending and psychiatry, will update dss once response available     Update:  Emailed psychs response to DSS for review.  TOC supervisor also on email Expected Discharge Plan: Memory Care Barriers to Discharge: Other (must enter comment) (Facility will not accept patient back)  Expected Discharge Plan and Services Expected Discharge Plan: Memory Care   Discharge Planning Services: CM Consult   Living arrangements for the past 2 months: Group Home                 DME Arranged: N/A DME Agency: NA       HH Arranged: NA HH Agency: NA         Social Determinants of Health (SDOH) Interventions    Readmission Risk Interventions     No data to  display

## 2022-07-15 DIAGNOSIS — I10 Essential (primary) hypertension: Secondary | ICD-10-CM | POA: Diagnosis not present

## 2022-07-15 DIAGNOSIS — R338 Other retention of urine: Secondary | ICD-10-CM | POA: Diagnosis not present

## 2022-07-15 DIAGNOSIS — F03918 Unspecified dementia, unspecified severity, with other behavioral disturbance: Secondary | ICD-10-CM | POA: Diagnosis not present

## 2022-07-15 DIAGNOSIS — R630 Anorexia: Secondary | ICD-10-CM | POA: Diagnosis not present

## 2022-07-15 MED ORDER — HALOPERIDOL LACTATE 5 MG/ML IJ SOLN
1.0000 mg | Freq: Once | INTRAMUSCULAR | Status: DC
  Filled 2022-07-15: qty 1

## 2022-07-15 NOTE — Progress Notes (Signed)
Pt combative, agitated, attempting to get out of bed. Dr Damita Dunnings notified.

## 2022-07-15 NOTE — Assessment & Plan Note (Signed)
Complicates overall prognosis. 

## 2022-07-15 NOTE — Assessment & Plan Note (Signed)
Appreciate psych input.  Continue Depakote, Remeron, Seroquel, Risperdal and as needed Haldol.  Adjusted and increase the dose of Risperdal and Seroquel today by psych

## 2022-07-15 NOTE — Assessment & Plan Note (Signed)
Complicates overall prognosis.  Palliative care and TOC team is working with DSS for placement and goals of care 

## 2022-07-15 NOTE — Progress Notes (Signed)
  Progress Note   Patient: VRISHANK MOSTER KJZ:[id number] DOB: 20-Jun-1941 DOA: 06/19/2022     5 DOS: the patient was seen and examined on 07/15/2022   Brief hospital course: ARSAL TAPPAN is a 81 y.o. male with medical history significant for dementia with behavioral disturbance, hypertension, hyperlipidemia, AAA with repair, gout, who initially presented to [facility] ED after aggressive and combative behavior at the group home.  He was brought into the ED on 06/28/2022 via EMS under IVC.  He has been staying in the ED, seen by psychiatry and social worker, awaiting placement.  Admitted to hospitalist service on 9/19 as patient has increased weakness. Patient has significant agitation, barely eating.  Perative care consult obtained, currently working with DSS for goal of care.   9/27: Was agitated earlier required Geodon.  9/28: TOC working on placement 9/29: Required one-time Geodon this afternoon for agitation.  Psychiatry following and increased his dose of Risperdal and Seroquel today 9/30: Per nursing note patient was combative, agitated and attempting to get out of the bed last night - sleepy this morning   Assessment and Plan: * Dementia with behavioral disturbance [facility]) Appreciate psych input.  Continue Depakote, Remeron, Seroquel, Risperdal and as needed Haldol.  Adjusted and increase the dose of Risperdal and Seroquel today by psych  Essential hypertension Continue Norvasc  Central sleep apnea Monitor  Protein-calorie malnutrition, severe Complicates overall prognosis.  Failure to thrive in adult Complicates overall prognosis.  Palliative care and TOC team is working with DSS for placement and goals of care        Subjective: Sleeping comfortably  Physical Exam: Vitals:   07/14/22 0850 07/14/22 2008 07/15/22 0550 07/15/22 0836  BP: 133/66 (!) 143/64 (!) 141/70 119/83  Pulse: 76 (!) 58 (!) 48 (!) 58  Resp: 20 16 16 20   Temp: 98.3 F (36.8 C) 98.6 F (37 C) 97.7 F  (36.5 C) 97.7 F (36.5 C)  TempSrc: Oral Axillary Axillary Oral  SpO2: 97% 100% 100% 99%  Weight:       General exam:Ill-appearing, severely malnourished. Respiratory system: Clear to auscultation. Respiratory effort normal. Cardiovascular system:S1 &S2 heard, RRR. No JVD, murmurs, rubs, gallops or clicks. No pedal edema. Gastrointestinal system:Abdomen is nondistended, soft and nontender. No organomegaly or masses felt. Normal bowel sounds heard. Central nervous system:Drowsy  Extremities: Symmetric 5 x 5 power. Skin: No rashes, lesions or ulcers Data Reviewed:  There are no new results to review at this time.  Family Communication: None  Disposition: Status is: Inpatient Remains inpatient appropriate because: Waiting for safe disposition   Planned Discharge Destination: Skilled nursing facility    DVT prophylaxis-Lovenox Time spent: 35 minutes  Author: Max Sane, MD 07/15/2022 1:50 PM  For on call review [email or web address].

## 2022-07-15 NOTE — Assessment & Plan Note (Signed)
Monitor

## 2022-07-15 NOTE — Assessment & Plan Note (Signed)
Continue Norvasc

## 2022-07-15 NOTE — Progress Notes (Signed)
Physical Therapy Treatment Patient Details Name: Larry Buckley MRN: [id number] DOB: [birth year] Today's Date: 07/15/2022   History of Present Illness Pt admitted for dementia with behavior disturbance. Pt from family care home and is poor historian.    PT Comments    Pt willing and able to participate in PT today. Mitts were initially donned, taken off during PT session and then re-donned after session.  Pt is progressing towards goals performing mobility at an overall min A level mainly to aid pt in understanding instructions and cues for mobility tasks and walker management.  Current d/c recommendation for PT is appropriate.  Recommendations for follow up therapy are one component of a multi-disciplinary discharge planning process, led by the attending physician.  Recommendations may be updated based on patient status, additional functional criteria and insurance authorization.  Follow Up Recommendations  Home health PT     Assistance Recommended at Discharge Intermittent Supervision/Assistance  Patient can return home with the following A lot of help with walking and/or transfers;A lot of help with bathing/dressing/bathroom;Help with stairs or ramp for entrance   Equipment Recommendations  Rolling walker (2 wheels);BSC/3in1    Recommendations for Other Services       Precautions / Restrictions Precautions Precautions: Fall Restrictions Weight Bearing Restrictions: No     Mobility  Bed Mobility Overal bed mobility: Needs Assistance Bed Mobility: Supine to Sit, Sit to Supine Rolling: Min assist   Supine to sit: Supervision Sit to supine: Min guard   General bed mobility comments: Min A to help pt understand command to sit  up at side of bed.    Transfers Overall transfer level: Needs assistance Equipment used: Rolling walker (2 wheels) Transfers: Sit to/from Stand Sit to Stand: Min assist Stand pivot transfers: Min assist         General transfer comment:  Assist mainly to aid pt in understanding command to stand up and how to manage RW to turn and sit.    Ambulation/Gait Ambulation/Gait assistance: Min assist Gait Distance (Feet): 70 Feet Assistive device: Rolling walker (2 wheels) Gait Pattern/deviations: Step-through pattern, Knee flexed in stance - right, Knee flexed in stance - left, Trunk flexed Gait velocity: decreased     General Gait Details: needs min assist for steering and progression of RW. Has difficulty with turns often coming outside of BOS. Short reciprocal gait pattern noted.   Stairs             Wheelchair Mobility    Modified Rankin (Stroke Patients Only)       Balance Overall balance assessment: Needs assistance Sitting-balance support: Feet supported Sitting balance-Leahy Scale: Good     Standing balance support: Bilateral upper extremity supported Standing balance-Leahy Scale: Fair Standing balance comment: with RW and difficulty with upright posture.                            Cognition Arousal/Alertness: Awake/alert Behavior During Therapy: Flat affect Overall Cognitive Status: History of cognitive impairments - at baseline                                 General Comments: difficulty following commands during session and has flat affect during session.        Exercises      General Comments        Pertinent Vitals/Pain Pain Assessment Pain Assessment: No/denies pain    Home  Living                          Prior Function            PT Goals (current goals can now be found in the care plan section) Acute Rehab PT Goals Patient Stated Goal: unable to state PT Goal Formulation: Patient unable to participate in goal setting Time For Goal Achievement: 07/16/22 Potential to Achieve Goals: Fair Progress towards PT goals: Progressing toward goals    Frequency    Min 2X/week      PT Plan Current plan remains appropriate     Co-evaluation              AM-PAC PT "6 Clicks" Mobility   Outcome Measure  Help needed turning from your back to your side while in a flat bed without using bedrails?: A Little Help needed moving from lying on your back to sitting on the side of a flat bed without using bedrails?: A Little Help needed moving to and from a bed to a chair (including a wheelchair)?: A Little Help needed standing up from a chair using your arms (e.g., wheelchair or bedside chair)?: A Little Help needed to walk in hospital room?: A Little Help needed climbing 3-5 steps with a railing? : A Lot 6 Click Score: 17    End of Session Equipment Utilized During Treatment: Gait belt Activity Tolerance: Patient tolerated treatment well Patient left: in chair;with chair alarm set;Other (comment);with call bell/phone within reach (mitts reapplied) Nurse Communication: Mobility status PT Visit Diagnosis: Unsteadiness on feet (R26.81);Muscle weakness (generalized) (M62.81);Difficulty in walking, not elsewhere classified (R26.2)     Time: 6789-3810 PT Time Calculation (min) (ACUTE ONLY): 27 min  Charges:  $Gait Training: 8-22 mins $Therapeutic Activity: 8-22 mins                    Bjorn Loser, PTA  07/15/22, 2:50 PM

## 2022-07-16 DIAGNOSIS — I10 Essential (primary) hypertension: Secondary | ICD-10-CM | POA: Diagnosis not present

## 2022-07-16 DIAGNOSIS — R310 Gross hematuria: Secondary | ICD-10-CM | POA: Diagnosis not present

## 2022-07-16 DIAGNOSIS — E440 Moderate protein-calorie malnutrition: Secondary | ICD-10-CM | POA: Diagnosis not present

## 2022-07-16 DIAGNOSIS — R319 Hematuria, unspecified: Secondary | ICD-10-CM | POA: Diagnosis not present

## 2022-07-16 DIAGNOSIS — F03918 Unspecified dementia, unspecified severity, with other behavioral disturbance: Secondary | ICD-10-CM | POA: Diagnosis not present

## 2022-07-16 LAB — BASIC METABOLIC PANEL
Anion gap: 4 — ABNORMAL LOW (ref 5–15)
BUN: 21 mg/dL (ref 8–23)
CO2: 27 mmol/L (ref 22–32)
Calcium: 8.9 mg/dL (ref 8.9–10.3)
Chloride: 108 mmol/L (ref 98–111)
Creatinine, Ser: 0.85 mg/dL (ref 0.61–1.24)
GFR, Estimated: >60 mL/min (ref >60)
Glucose, Bld: 89 mg/dL (ref 70–99)
Potassium: 4.5 mmol/L (ref 3.5–5.1)
Sodium: 139 mmol/L (ref 135–145)

## 2022-07-16 LAB — CBC
HCT: 35.4 % — ABNORMAL LOW (ref 39.0–52.0)
Hemoglobin: 11.6 g/dL — ABNORMAL LOW (ref 13.0–17.0)
MCH: 30.5 pg (ref 26.0–34.0)
MCHC: 32.8 g/dL (ref 30.0–36.0)
MCV: 93.2 fL (ref 80.0–100.0)
Platelets: 322 10*3/uL (ref 150–400)
RBC: 3.8 MIL/uL — ABNORMAL LOW (ref 4.22–5.81)
RDW: 14 % (ref 11.5–15.5)
WBC: 8.6 10*3/uL (ref 4.0–10.5)
nRBC: 0 % (ref 0.0–0.2)

## 2022-07-16 NOTE — Progress Notes (Signed)
  Progress Note   Patient: Larry Buckley AUQ:[id number] DOB: 09-05-1941 DOA: 06/30/22     6 DOS: the patient was seen and examined on 07/16/2022   Brief hospital course: Larry Buckley is a 81 y.o. male with medical history significant for dementia with behavioral disturbance, hypertension, hyperlipidemia, AAA with repair, gout, who initially presented to [facility] ED after aggressive and combative behavior at the group home.  He was brought into the ED on 2022/06/30 via EMS under IVC.  He has been staying in the ED, seen by psychiatry and social worker, awaiting placement.  Admitted to hospitalist service on 9/19 as patient has increased weakness. Patient has significant agitation, barely eating.  Perative care consult obtained, currently working with DSS for goal of care.   9/27: Was agitated earlier required Geodon.  9/28: TOC working on placement 9/29: Required one-time Geodon this afternoon for agitation.  Psychiatry following and increased his dose of Risperdal and Seroquel today 9/30: Per nursing note patient was combative, agitated and attempting to get out of the bed last night - sleepy this morning 10/1: Urology consulted for hematuria and change of Foley catheter   Assessment and Plan: * Dementia with behavioral disturbance [name]) Appreciate psych input.  Continue Depakote, Remeron, Seroquel, Risperdal and as needed Haldol.  Psychiatry increased the dose of Risperdal and Seroquel on 9/29  Patient continues to have intermittent agitation at night/sundowning.  Last night he pulled Foley resulting in hematuria and reinsertion of Foley  Essential hypertension Continue Norvasc  Hematuria Patient pulled out Foley catheter last night leading to reinsertion by nurse overnight but patient was continued to have gross hematuria so urology consult was obtained.  Appreciate urology intervention the catheter was in the wrong passage and has been changed over and has been irrigating  well    Central sleep apnea Monitor  Protein-calorie malnutrition, severe Complicates overall prognosis.  Failure to thrive in adult Complicates overall prognosis.  Palliative care and TOC team is working with DSS for placement and goals of care        Subjective: Seems somewhat agitated has mittens on  Physical Exam: Vitals:   07/16/22 0313 07/16/22 0814 07/16/22 1732 07/16/22 1957  BP: (!) 135/59 137/60 (!) 106/48 (!) 118/53  Pulse: 72 62 65 68  Resp: 17 18 18 18   Temp: 99.8 F (37.7 C) 97.8 F (36.6 C) 97.6 F (36.4 C) 98.9 F (37.2 C)  TempSrc:      SpO2: 100% 100% 100% 100%  Weight:       General exam:Ill-appearing, severely malnourished. Respiratory system: Clear to auscultation. Respiratory effort normal. Cardiovascular system: Regular rate and rhythm Gastrointestinal system:Abdomen soft and benign Central nervous system:Drowsy  GU: Foley is inserted with blood all around his private parts Extremities: Symmetric 5 x 5 power. Skin: No rashes, lesions or ulcers Data Reviewed:  Hemoglobin 11.6  Family Communication: None  Disposition: Status is: Inpatient Remains inpatient appropriate because: Waiting for safe placement   Planned Discharge Destination: Skilled nursing facility    DVT prophylaxis-SCDs Time spent: 25 minutes  Author: Max Sane, MD 07/16/2022 10:08 PM  For on call review [email or web address].

## 2022-07-16 NOTE — Assessment & Plan Note (Signed)
Complicates overall prognosis.  Palliative care and TOC team is working with DSS for placement and goals of care 

## 2022-07-16 NOTE — Assessment & Plan Note (Signed)
Patient pulled out Foley catheter last night leading to reinsertion by nurse overnight but patient was continued to have gross hematuria so urology consult was obtained.  Appreciate urology intervention the catheter was in the wrong passage and has been changed over and has been irrigating well

## 2022-07-16 NOTE — Assessment & Plan Note (Signed)
Continue Norvasc

## 2022-07-16 NOTE — Assessment & Plan Note (Signed)
Monitor

## 2022-07-16 NOTE — Assessment & Plan Note (Signed)
Complicates overall prognosis. 

## 2022-07-16 NOTE — Consult Note (Signed)
I have been asked to see the patient by Dr. Delfino Lovett, for evaluation and management of gross hematuria.  History of present illness: 81 year old male with history of dementia, failure to thrive and host of other comorbidities who last night was agitated and pulled on his catheter creating quite a bit of hematuria.  The nurse was able to irrigate, but there were large clots and ultimately the catheter stopped draining.  She then removed that Foley catheter and placed another 1.  At the time of placement, there was some blood draining, but there was no urine.  I was consulted for further evaluation.  Its not clear to me why the patient has a Foley catheter, but he is intermittently lucid, is able to participate in conversation, but is unable to explain any sort of current event.  Review of systems: A 12 point comprehensive review of systems was obtained and is negative unless otherwise stated in the history of present illness.  Patient Active Problem List   Diagnosis Date Noted   Central sleep apnea 07/11/2022   Acute urinary retention 07/11/2022   Protein-calorie malnutrition, severe 07/07/2022   Anorexia 07/05/2022   Failure to thrive in adult 07/04/2022   Acute pain of right shoulder 06/08/2022   Acute hip pain, left 06/08/2022   Low hemoglobin 04/26/2022   Edema of both legs 01/27/2022   Prediabetes 01/04/2022   Dementia with behavioral disturbance (HCC) 12/29/2021   History of abdominal aortic aneurysm (AAA) repair 09/25/2021   B12 deficiency 07/11/2021   Hyperlipidemia, mixed    Essential hypertension    Gout, arthropathy     No current facility-administered medications on file prior to encounter.   Current Outpatient Medications on File Prior to Encounter  Medication Sig Dispense Refill   amLODipine (NORVASC) 5 MG tablet Take 1 tablet (5 mg total) by mouth daily. 90 tablet 4   aspirin EC 81 MG tablet Take 1 tablet (81 mg total) by mouth daily. Swallow whole. 90 tablet 4    atorvastatin (LIPITOR) 20 MG tablet Take 1 tablet (20 mg total) by mouth daily. 90 tablet 4   divalproex (DEPAKOTE) 500 MG DR tablet Take 500 mg by mouth 2 (two) times daily.     Melatonin 10 MG SUBL Place 1 tablet under the tongue at bedtime. 90 tablet 4   mirtazapine (REMERON) 15 MG tablet Take 1 tablet (15 mg total) by mouth at bedtime. 90 tablet 4   risperiDONE (RISPERDAL) 0.5 MG tablet Take 1 tablet (0.5 mg total) by mouth 2 (two) times daily. (Patient taking differently: Take 0.75 mg by mouth 2 (two) times daily.) 60 tablet 1   Cholecalciferol (VITAMIN D-1000 MAX ST) 25 MCG (1000 UT) tablet Take by mouth. (Patient not taking: Reported on 07/02/2022)     divalproex (DEPAKOTE) 125 MG DR tablet Take by mouth. (Patient not taking: Reported on 06/16/2022)     LORazepam (ATIVAN) 0.5 MG tablet Take 0.5 mg by mouth 3 (three) times daily as needed. (Patient not taking: Reported on 06/28/2022)      Past Medical History:  Diagnosis Date   AAA (abdominal aortic aneurysm) (HCC)    Dementia (HCC) 05/21/2020   Gout, arthropathy    Hyperlipidemia    Hypertension     Past Surgical History:  Procedure Laterality Date   PERIPHERAL VASCULAR CATHETERIZATION N/A 02/08/2016   Procedure: Endovascular Repair/Stent Graft;  Surgeon: Annice Needy, MD;  Location: [facility] INVASIVE CV LAB;  Service: Cardiovascular;  Laterality: N/A;    Social History  Tobacco Use   Smoking status: Former   Smokeless tobacco: Never  Substance Use Topics   Alcohol use: Yes   Drug use: No    Family History  Problem Relation Age of Onset   Cancer Mother    Diabetes Mother    Heart disease Father    Hypertension Father    Heart disease Brother     PE: Vitals:   07/15/22 1617 07/15/22 1857 07/16/22 0313 07/16/22 0814  BP: (!) 115/56 120/64 (!) 135/59 137/60  Pulse: 60 (!) 57 72 62  Resp: 18 18 17 18   Temp: 98.8 F (37.1 C) 98.5 F (36.9 C) 99.8 F (37.7 C) 97.8 F (36.6 C)  TempSrc:      SpO2: 96% 100% 100% 100%   Weight:       Patient appears to be in no acute distress  patient is alert and oriented x3 Atraumatic normocephalic head No cervical or supraclavicular lymphadenopathy appreciated No increased work of breathing, no audible wheezes/rhonchi Regular sinus rhythm/rate Abdomen is soft, nontender, nondistended, no CVA or suprapubic tenderness Lower extremities are symmetric without appreciable edema Grossly neurologically intact No identifiable skin lesions  Recent Labs    07/14/22 0746 07/16/22 0659  WBC 4.7 8.6  HGB 13.1 11.6*  HCT 39.9 35.4*   Recent Labs    07/14/22 0746 07/16/22 0659  NA 139 139  K 4.0 4.5  CL 110 108  CO2 23 27  GLUCOSE 94 89  BUN 19 21  CREATININE 0.81 0.85  CALCIUM 9.1 8.9   No results for input(s): "LABPT", "INR" in the last 72 hours. No results for input(s): "LABURIN" in the last 72 hours. Results for orders placed or performed during the hospital encounter of 07/10/2022  Resp Panel by RT-PCR (Flu A&B, Covid) Anterior Nasal Swab     Status: None   Collection Time: 06/19/2022  2:17 PM   Specimen: Anterior Nasal Swab  Result Value Ref Range Status   SARS Coronavirus 2 by RT PCR NEGATIVE NEGATIVE Final    Comment: (NOTE) SARS-CoV-2 target nucleic acids are NOT DETECTED.  The SARS-CoV-2 RNA is generally detectable in upper respiratory specimens during the acute phase of infection. The lowest concentration of SARS-CoV-2 viral copies this assay can detect is 138 copies/mL. A negative result does not preclude SARS-Cov-2 infection and should not be used as the sole basis for treatment or other patient management decisions. A negative result may occur with  improper specimen collection/handling, submission of specimen other than nasopharyngeal swab, presence of viral mutation(s) within the areas targeted by this assay, and inadequate number of viral copies(<138 copies/mL). A negative result must be combined with clinical observations, patient history,  and epidemiological information. The expected result is Negative.  Fact Sheet for Patients:  EntrepreneurPulse.com.au  Fact Sheet for Healthcare Providers:  IncredibleEmployment.be  This test is no t yet approved or cleared by the [place] FDA and  has been authorized for detection and/or diagnosis of SARS-CoV-2 by FDA under an Emergency Use Authorization (EUA). This EUA will remain  in effect (meaning this test can be used) for the duration of the COVID-19 declaration under Section 564(b)(1) of the Act, 21 U.S.C.section 360bbb-3(b)(1), unless the authorization is terminated  or revoked sooner.       Influenza A by PCR NEGATIVE NEGATIVE Final   Influenza B by PCR NEGATIVE NEGATIVE Final    Comment: (NOTE) The Xpert Xpress SARS-CoV-2/FLU/RSV plus assay is intended as an aid in the diagnosis of influenza from Nasopharyngeal  swab specimens and should not be used as a sole basis for treatment. Nasal washings and aspirates are unacceptable for Xpert Xpress SARS-CoV-2/FLU/RSV testing.  Fact Sheet for Patients: BloggerCourse.com  Fact Sheet for Healthcare Providers: SeriousBroker.it  This test is not yet approved or cleared by the [place] FDA and has been authorized for detection and/or diagnosis of SARS-CoV-2 by FDA under an Emergency Use Authorization (EUA). This EUA will remain in effect (meaning this test can be used) for the duration of the COVID-19 declaration under Section 564(b)(1) of the Act, 21 U.S.C. section 360bbb-3(b)(1), unless the authorization is terminated or revoked.  Performed at [facility], [address] [place], [place] [address]   SARS Coronavirus 2 by RT PCR (hospital order, performed in [facility] hospital lab) *cepheid single result test* Anterior Nasal Swab     Status: None   Collection Time: 07/01/22  2:20 AM   Specimen: Anterior Nasal Swab   Result Value Ref Range Status   SARS Coronavirus 2 by RT PCR NEGATIVE NEGATIVE Final    Comment: (NOTE) SARS-CoV-2 target nucleic acids are NOT DETECTED.  The SARS-CoV-2 RNA is generally detectable in upper and lower respiratory specimens during the acute phase of infection. The lowest concentration of SARS-CoV-2 viral copies this assay can detect is 250 copies / mL. A negative result does not preclude SARS-CoV-2 infection and should not be used as the sole basis for treatment or other patient management decisions.  A negative result may occur with improper specimen collection / handling, submission of specimen other than nasopharyngeal swab, presence of viral mutation(s) within the areas targeted by this assay, and inadequate number of viral copies (<250 copies / mL). A negative result must be combined with clinical observations, patient history, and epidemiological information.  Fact Sheet for Patients:   RoadLapTop.co.za  Fact Sheet for Healthcare Providers: http://kim-miller.com/  This test is not yet approved or  cleared by the [place] FDA and has been authorized for detection and/or diagnosis of SARS-CoV-2 by FDA under an Emergency Use Authorization (EUA).  This EUA will remain in effect (meaning this test can be used) for the duration of the COVID-19 declaration under Section 564(b)(1) of the Act, 21 U.S.C. section 360bbb-3(b)(1), unless the authorization is terminated or revoked sooner.  Performed at [facility], [address], [place], [place] [address]     Procedure: This 16 French Foley catheter that was not draining was removed from his bladder.  I then attempted to place a 24 [place] three-way coud tipped catheter, but was unable to get it into his bladder.  I subsequently then used the flexible cystoscope and noted a false passage in the patient's membranous urethra posteriorly.  I was able to push the  scope into the patient's bladder and then advanced a wire through the scope into the patient's bladder removing the scope over the wire.  I then subsequently tried to advance the 24 [place] hematuria catheter over the wire unsuccessfully.  I repassed the scope alongside the wire noting the wire to be in the appropriate place but that the patient did appear to have a bladder neck and posterior urethral scar/stricture.  I then advanced an 50 [place] council tip to a catheter over the wire successfully.  The urine was bloody, but the catheter was irrigating, and the urine appeared to be clearing.  15 cc of sterile water was placed in the patient's catheter balloon.   Imp: False passed 16 French Foley catheter followed by 24  [place] hematuria catheter through the membranous urethra posteriorly.  Ultimately, an 27 [place] council tip catheter is now in place and draining fairly thin cranberry colored urine, which is irrigating well and draining well.  Recommendations: I am hopeful that the patient's bleeding will settle down, most of the bleeding is per urethra.  It is not entirely clear to me why the patient has a Foley catheter, but he should keep this catheter for a minimum of 1 week to allow the urethra to heal prior to removal.   Thank you for involving me in this patient's care, Please page with any further questions or concerns. Crist Fat

## 2022-07-16 NOTE — Assessment & Plan Note (Signed)
Appreciate psych input.  Continue Depakote, Remeron, Seroquel, Risperdal and as needed Haldol.  Psychiatry increased the dose of Risperdal and Seroquel on 9/29  Patient continues to have intermittent agitation at night/sundowning.  Last night he pulled Foley resulting in hematuria and reinsertion of Foley

## 2022-07-16 NOTE — Progress Notes (Addendum)
Pt has attempted to get out of bed several times this shift.  He required Haldol IM x 1 at 2315 due to agitation and attempting to get out of bed.  Pt has also pulled on his foley and blood clots now noted in tubing and into drainage bag.  Sharion Settler NP made aware.   Ayesha Mohair BSN RN CMSRN 07/16/2022, 2:59 AM  Foley irrigated with 531ml Normal Saline.  Large blood clots removed and urine is now cherry red.  Some bleeding noted at meatus. Ayesha Mohair BSN RN [place] 07/16/2022, 3:44 AM  Foley with no urine output despite irrigation a 2nd time.  Order obtained from Sharion Settler to replace foley.  Foley removed easily, but about 53ml blood noted dripping from meatus.  16 fr straight tip foley placed without resistance but urine is bloody.   Ayesha Mohair BSN RN CMSRN 07/16/2022, 6:48 AM

## 2022-07-16 DEATH — deceased

## 2022-07-17 DIAGNOSIS — R310 Gross hematuria: Secondary | ICD-10-CM | POA: Diagnosis not present

## 2022-07-17 DIAGNOSIS — I10 Essential (primary) hypertension: Secondary | ICD-10-CM | POA: Diagnosis not present

## 2022-07-17 DIAGNOSIS — E440 Moderate protein-calorie malnutrition: Secondary | ICD-10-CM | POA: Diagnosis not present

## 2022-07-17 DIAGNOSIS — F03918 Unspecified dementia, unspecified severity, with other behavioral disturbance: Secondary | ICD-10-CM | POA: Diagnosis not present

## 2022-07-17 LAB — BASIC METABOLIC PANEL
Anion gap: 8 (ref 5–15)
BUN: 31 mg/dL — ABNORMAL HIGH (ref 8–23)
CO2: 24 mmol/L (ref 22–32)
Calcium: 8.5 mg/dL — ABNORMAL LOW (ref 8.9–10.3)
Chloride: 105 mmol/L (ref 98–111)
Creatinine, Ser: 0.92 mg/dL (ref 0.61–1.24)
GFR, Estimated: >60 mL/min (ref >60)
Glucose, Bld: 100 mg/dL — ABNORMAL HIGH (ref 70–99)
Potassium: 4.2 mmol/L (ref 3.5–5.1)
Sodium: 137 mmol/L (ref 135–145)

## 2022-07-17 LAB — CBC
HCT: 29.9 % — ABNORMAL LOW (ref 39.0–52.0)
Hemoglobin: 9.9 g/dL — ABNORMAL LOW (ref 13.0–17.0)
MCH: 30.6 pg (ref 26.0–34.0)
MCHC: 33.1 g/dL (ref 30.0–36.0)
MCV: 92.3 fL (ref 80.0–100.0)
Platelets: 289 10*3/uL (ref 150–400)
RBC: 3.24 MIL/uL — ABNORMAL LOW (ref 4.22–5.81)
RDW: 14 % (ref 11.5–15.5)
WBC: 7.9 10*3/uL (ref 4.0–10.5)
nRBC: 0 % (ref 0.0–0.2)

## 2022-07-17 NOTE — Progress Notes (Signed)
Mobility Specialist - Progress Note   07/17/22 1400  Mobility  Activity Ambulated with assistance in hallway  Activity Response Tolerated well  Distance Ambulated (ft) 40 ft  $Mobility charge 1 Mobility  Level of Assistance Minimal assist, patient does 75% or more  Assistive Device Front wheel walker;None     Pt attempting to get OOB upon arrival, sitting at the foot of the bed utilizing RA. Confused. Pt agreeable to activity but agitated on arrival. Tries to swing at staff several times during session when managing lines and providing tactile cueing. Follows commands intermittently. MAX cueing for maintaining hand placement. MAX redirection. Does begin ambulation with RW with poor biomechanics. Trials return distance with HHA with better posture and increased speed. Manual facilitation at hips to return to seated position. Pt left in bed with alarm set, B mitts donned, and needs in reach. RN notified.   Kathee Delton Mobility Specialist 07/17/22, 3:04 PM

## 2022-07-17 NOTE — Assessment & Plan Note (Signed)
Continue Norvasc

## 2022-07-17 NOTE — Progress Notes (Signed)
  Progress Note   Patient: Larry Buckley ZOX:[id number] DOB: 10-02-1941 DOA: 07/03/2022     7 DOS: the patient was seen and examined on 07/17/2022   Brief hospital course: DEONDRAY OSPINA is a 81 y.o. male with medical history significant for dementia with behavioral disturbance, hypertension, hyperlipidemia, AAA with repair, gout, who initially presented to [facility] ED after aggressive and combative behavior at the group home.  He was brought into the ED on 06/23/2022 via EMS under IVC.  He has been staying in the ED, seen by psychiatry and social worker, awaiting placement.  Admitted to hospitalist service on 9/19 as patient has increased weakness. Patient has significant agitation, barely eating.  Perative care consult obtained, currently working with DSS for goal of care.   9/27: Was agitated earlier required Geodon.  9/28: TOC working on placement 9/29: Required one-time Geodon this afternoon for agitation.  Psychiatry following and increased his dose of Risperdal and Seroquel today 9/30: Per nursing note patient was combative, agitated and attempting to get out of the bed last night - sleepy this morning 10/1: Urology consulted for hematuria and change of Foley catheter 10/2: Waiting for placement   Assessment and Plan: * Dementia with behavioral disturbance [facility]) Appreciate psych input.  Continue Depakote, Remeron, Seroquel, Risperdal and as needed Haldol.  Psychiatry increased the dose of Risperdal and Seroquel on 9/29  Patient continues to have intermittent agitation at night/sundowning.   Essential hypertension Continue Norvasc  Hematuria Patient pulled out Foley catheter last night leading to reinsertion by nurse overnight but patient was continued to have gross hematuria so urology consult was obtained.  Appreciate urology intervention the catheter was in the wrong passage and has been changed over and has been irrigating well    Central sleep apnea Monitor  Protein-calorie  malnutrition, severe Complicates overall prognosis.  Failure to thrive in adult Complicates overall prognosis.  Palliative care and TOC team is working with DSS for placement and goals of care        Subjective: Laying in the bed comfortably, no new issues per sitter he does still get agitated at night  Physical Exam: Vitals:   07/16/22 1957 07/17/22 0430 07/17/22 0748 07/17/22 1532  BP: (!) 118/53 106/60 (!) 121/56 (!) 125/91  Pulse: 68 65 (!) 102 61  Resp: 18 18 16 14   Temp: 98.9 F (37.2 C) 98.1 F (36.7 C) 98.6 F (37 C) 98.3 F (36.8 C)  TempSrc:  Axillary Oral Oral  SpO2: 100% 94% 93%   Weight:       General exam:Ill-appearing, severely malnourished. Respiratory system: Clear to auscultation. Respiratory effort normal. Cardiovascular system: Regular rate and rhythm Gastrointestinal system:Abdomen soft and benign Central nervous system:Drowsy  GU: Foley is inserted with blood all around his private parts Extremities: Symmetric 5 x 5 power. Skin: No rashes, lesions or ulcers Data Reviewed:  There are no new results to review at this time.  Family Communication: None  Disposition: Status is: Inpatient Remains inpatient appropriate because: Waiting for placement   Planned Discharge Destination: Skilled nursing facility    DVT prophylaxis-SCDs Time spent: 15 minutes  Author: Max Sane, MD 07/17/2022 8:04 PM  For on call review [email or web address].

## 2022-07-17 NOTE — Assessment & Plan Note (Signed)
Complicates overall prognosis.  Palliative care and [facility] team is working with DSS for placement and goals of care

## 2022-07-17 NOTE — Assessment & Plan Note (Signed)
Patient pulled out Foley catheter last night leading to reinsertion by nurse overnight but patient was continued to have gross hematuria so urology consult was obtained.  Appreciate urology intervention the catheter was in the wrong passage and has been changed over and has been irrigating well   

## 2022-07-17 NOTE — Assessment & Plan Note (Signed)
Complicates overall prognosis. 

## 2022-07-17 NOTE — Assessment & Plan Note (Addendum)
Appreciate psych input.  Continue Depakote, Remeron, Seroquel, Risperdal and as needed Haldol.  Psychiatry increased the dose of Risperdal and Seroquel on 9/29  Patient continues to have intermittent agitation at night/sundowning.

## 2022-07-18 DIAGNOSIS — I10 Essential (primary) hypertension: Secondary | ICD-10-CM | POA: Diagnosis not present

## 2022-07-18 DIAGNOSIS — R338 Other retention of urine: Secondary | ICD-10-CM | POA: Diagnosis not present

## 2022-07-18 DIAGNOSIS — F03918 Unspecified dementia, unspecified severity, with other behavioral disturbance: Secondary | ICD-10-CM | POA: Diagnosis not present

## 2022-07-18 DIAGNOSIS — Z7189 Other specified counseling: Secondary | ICD-10-CM | POA: Diagnosis not present

## 2022-07-18 DIAGNOSIS — R630 Anorexia: Secondary | ICD-10-CM | POA: Diagnosis not present

## 2022-07-18 NOTE — Progress Notes (Signed)
Daily Progress Note   Patient Name: Larry Buckley       Date: 07/18/2022 DOB: 07-Feb-1941  Age: 81 y.o. MRN#: [id number] Attending Physician: Max Sane, MD Primary Care Physician: Venita Lick, NP Admit Date: 06/21/2022  Reason for Consultation/Follow-up: Establishing goals of care  Subjective: Notes reviewed.  Patient is resting in bed with eyes closed.  Spoke with TOC.  We discussed that patient continues to intermittently try to hit staff members.  Discussed that psychiatry is following for behavioral management.  Discussed that I have spoken with DSS regarding goals of care and recommendations, but I have not been able to speak with them since. TOC states that they have been interacting with DSS via email. I left an additional VM for DSS Barrie Lyme today with PMT team contact information.  PMT will sign off at this time. Recommend outpatient palliative to follow. Please reconsult if DSS wishes to speak with a member of our team.   Length of Stay: 8  Current Medications: Scheduled Meds:   amLODipine  5 mg Oral Daily   atorvastatin  20 mg Oral QHS   Chlorhexidine Gluconate Cloth  6 each Topical Daily   divalproex  500 mg Oral BID   feeding supplement  237 mL Oral TID BM   haloperidol lactate  1 mg Intravenous Once   megestrol  400 mg Oral Daily   melatonin  10 mg Oral QHS   mirtazapine  15 mg Oral QHS   multivitamin with minerals  1 tablet Oral Daily   polyethylene glycol  17 g Oral BID   QUEtiapine  100 mg Oral QHS   risperiDONE  1 mg Oral BID   senna-docusate  2 tablet Oral BID    Continuous Infusions:   PRN Meds: acetaminophen, haloperidol **OR** haloperidol lactate, lactulose, ondansetron (ZOFRAN) IV, mouth rinse  Physical Exam Constitutional:      Comments: Eyes  closed.  Pulmonary:     Effort: Pulmonary effort is normal.             Vital Signs: BP (!) 150/79   Pulse 94   Temp 98.4 F (36.9 C)   Resp 18   Wt 82 kg   SpO2 100%   BMI 23.21 kg/m  SpO2: SpO2: 100 % O2 Device: O2 Device: Room Air O2 Flow Rate:  O2 Flow Rate (L/min): 3 L/min  Intake/output summary:  Intake/Output Summary (Last 24 hours) at 07/18/2022 1311 Last data filed at 07/18/2022 0127 Gross per 24 hour  Intake 240 ml  Output 800 ml  Net -560 ml   LBM: Last BM Date : 07/16/22 Baseline Weight: Weight: 84 kg (historic weight. Please update during this admission.) Most recent weight: Weight: 82 kg    Patient Active Problem List   Diagnosis Date Noted   Hematuria 07/16/2022   Central sleep apnea 07/11/2022   Acute urinary retention 07/11/2022   Protein-calorie malnutrition, severe 07/07/2022   Anorexia 07/05/2022   Failure to thrive in adult 07/04/2022   Acute pain of right shoulder 06/08/2022   Acute hip pain, left 06/08/2022   Low hemoglobin 04/26/2022   Edema of both legs 01/27/2022   Prediabetes 01/04/2022   Dementia with behavioral disturbance (HCC) 12/29/2021   History of abdominal aortic aneurysm (AAA) repair 09/25/2021   B12 deficiency 07/11/2021   Hyperlipidemia, mixed    Essential hypertension    Gout, arthropathy     Palliative Care Assessment & Plan     Recommendations/Plan: VM left for DSS today with our contact information. Please see previous notes for further; discussion with DSS outlined in initial consult note. Recommend outpatient palliative to follow at D/C.  PMT will sign off at this time. Please reconsult if DSS wishes to speak with a member of our team regarding care moving forward.   Code Status:    Code Status Orders  (From admission, onward)           Start     Ordered   07/04/22 2111  Full code  Continuous        07/04/22 2110           Code Status History     Date Active Date Inactive Code Status Order ID  Comments User Context   07/15/2022 1337 07/04/2022 2110 Full Code [id number]  Minna Antis, MD ED   05/25/2022 2232 05/26/2022 0836 Full Code [id number]  Wynonia Sours, RN ED   03/30/2022 1406 04/03/2022 1950 Full Code [id number]  Minna Antis, MD ED   12/29/2021 0041 01/11/2022 1936 Full Code [id number]  Andris Baumann, MD ED      Care plan was discussed with [facility]  Thank you for allowing the Palliative Medicine Team to assist in the care of this patient.  Morton Stall, NP  Please contact Palliative Medicine Team phone at [phone] for questions and concerns.

## 2022-07-18 NOTE — Assessment & Plan Note (Signed)
Complicates overall prognosis.  Palliative care and TOC team is working with DSS for placement and goals of care 

## 2022-07-18 NOTE — Assessment & Plan Note (Signed)
Continue Norvasc

## 2022-07-18 NOTE — Progress Notes (Signed)
  Progress Note   Patient: Larry Buckley TDV:[id number] DOB: 28-Sep-1941 DOA: 06/26/2022     8 DOS: the patient was seen and examined on 07/18/2022   Brief hospital course: Larry Buckley is a 81 y.o. male with medical history significant for dementia with behavioral disturbance, hypertension, hyperlipidemia, AAA with repair, gout, who initially presented to [facility] ED after aggressive and combative behavior at the group home.  He was brought into the ED on 06/21/2022 via EMS under IVC.  He has been staying in the ED, seen by psychiatry and social worker, awaiting placement.  Admitted to hospitalist service on 9/19 as patient has increased weakness. Patient has significant agitation, barely eating.  Perative care consult obtained, currently working with DSS for goal of care.   9/27: Was agitated earlier required Geodon.  9/28: TOC working on placement 9/29: Required one-time Geodon this afternoon for agitation.  Psychiatry following and increased his dose of Risperdal and Seroquel today 9/30: Per nursing note patient was combative, agitated and attempting to get out of the bed last night - sleepy this morning 10/1: Urology consulted for hematuria and change of Foley catheter 10/2-10/3: Waiting for placement   Assessment and Plan: * Dementia with behavioral disturbance [facility]) Appreciate psych input.  Continue Depakote, Remeron, Seroquel, Risperdal and as needed Haldol.  Psychiatry increased the dose of Risperdal and Seroquel on 9/29  Patient continues to have intermittent agitation at night/sundowning.   Essential hypertension Continue Norvasc  Hematuria Patient pulled out Foley catheter on the night of 9/30 leading to reinsertion by nurse overnight but patient was continued to have gross hematuria so urology consult was obtained on 10/1.  Appreciate urology intervention the catheter was in the wrong passage and has been changed over and has been irrigating well    Central sleep  apnea Monitor  Protein-calorie malnutrition, severe Complicates overall prognosis.  Failure to thrive in adult Complicates overall prognosis.  Palliative care and TOC team is working with DSS for placement and goals of care        Subjective: No new issues  Physical Exam: Vitals:   07/17/22 2046 07/18/22 0223 07/18/22 0836 07/18/22 1255  BP: (!) 152/64 (!) 141/55 (!) 108/54 (!) 150/79  Pulse: 66 82 94   Resp: 18 20 18    Temp: 97.6 F (36.4 C) 97.6 F (36.4 C) 98.4 F (36.9 C)   TempSrc:      SpO2: 93%  100%   Weight:       General exam:Ill-appearing, severely malnourished. Respiratory system: Clear to auscultation. Respiratory effort normal. Cardiovascular system:Regular rate and rhythm Gastrointestinal system:Abdomen soft and benign Central nervous system:Drowsy  GG:YIRSW is inserted with blood all around his private parts Extremities: Symmetric 5 x 5 power. Skin: No rashes, lesions or ulcers Data Reviewed:  There are no new results to review at this time.  Family Communication: None, I tried calling DSS/legal guardian Barrie Lyme and also tried reaching her supervisor but no luck  Disposition: Status is: Inpatient Remains inpatient appropriate because: Waiting for safe disposition   Planned Discharge Destination: Skilled nursing facility    DVT prophylaxis-SCDs Time spent: 15 minutes  Author: Max Sane, MD 07/18/2022 1:17 PM  For on call review [email or web address].

## 2022-07-18 NOTE — TOC Progression Note (Signed)
Transition of Care [facility]) - Progression Note    Patient Details  Name: Larry Buckley MRN: [id number] Date of Birth: [birth year]  Transition of Care [facility]) CM/SW [place], LCSW Phone Number: 07/18/2022, 11:01 AM  Clinical Narrative:  Emailed legal guardian to see if there are any updates.   Expected Discharge Plan: Memory Care Barriers to Discharge: Other (must enter comment) (Facility will not accept patient back)  Expected Discharge Plan and Services Expected Discharge Plan: Memory Care   Discharge Planning Services: CM Consult   Living arrangements for the past 2 months: Group Home                 DME Arranged: N/A DME Agency: NA       HH Arranged: NA HH Agency: NA         Social Determinants of Health (SDOH) Interventions    Readmission Risk Interventions     No data to display

## 2022-07-18 NOTE — Assessment & Plan Note (Signed)
Complicates overall prognosis. 

## 2022-07-18 NOTE — Assessment & Plan Note (Signed)
Patient pulled out Foley catheter on the night of 9/30 leading to reinsertion by nurse overnight but patient was continued to have gross hematuria so urology consult was obtained on 10/1.  Appreciate urology intervention the catheter was in the wrong passage and has been changed over and has been irrigating well

## 2022-07-18 NOTE — Assessment & Plan Note (Signed)
Appreciate psych input.  Continue Depakote, Remeron, Seroquel, Risperdal and as needed Haldol.  Psychiatry increased the dose of Risperdal and Seroquel on 9/29  Patient continues to have intermittent agitation at night/sundowning.  

## 2022-07-18 NOTE — Assessment & Plan Note (Signed)
Monitor

## 2022-07-18 NOTE — Progress Notes (Signed)
Physical Therapy Treatment Patient Details Name: Larry Buckley MRN: [id number] DOB: 1941/10/02 Today's Date: 07/18/2022   History of Present Illness Pt admitted for dementia with behavior disturbance. Pt from family care home and is poor historian.    PT Comments    Pt due for re-evaluation and progression towards goals. Pt with inconsistent progression towards goals mostly limited by cognition. Due to agitation, have attempted to see pt earlier in day to facilitate OOB mobility. Pt hasn't demonstrated consistent enough participation to maintain on therapy caseload. Recommendation to continue nursing led mobility attempts. If pt able to consistently participate, please re-order PT as necessary. Will dc current order.  Recommendations for follow up therapy are one component of a multi-disciplinary discharge planning process, led by the attending physician.  Recommendations may be updated based on patient status, additional functional criteria and insurance authorization.  Follow Up Recommendations  Long-term institutional care without follow-up therapy Can patient physically be transported by private vehicle: No   Assistance Recommended at Discharge Intermittent Supervision/Assistance  Patient can return home with the following A lot of help with walking and/or transfers;A lot of help with bathing/dressing/bathroom;Help with stairs or ramp for entrance   Equipment Recommendations   (TBD)    Recommendations for Other Services       Precautions / Restrictions Precautions Precautions: Fall Restrictions Weight Bearing Restrictions: No     Mobility  Bed Mobility Overal bed mobility: Needs Assistance Bed Mobility: Supine to Sit, Sit to Supine     Supine to sit: Total assist Sit to supine: Total assist   General bed mobility comments: total assist for mobility to EOB in attempts for improved participation. Pt still unable to follow commands and remains lethargic although does keep  eyes open throughout    Transfers                   General transfer comment: not appropriate to attempt    Ambulation/Gait                   Stairs             Wheelchair Mobility    Modified Rankin (Stroke Patients Only)       Balance Overall balance assessment: Needs assistance Sitting-balance support: Feet supported Sitting balance-Leahy Scale: Zero                                      Cognition Arousal/Alertness: Lethargic, Suspect due to medications Behavior During Therapy: Flat affect Overall Cognitive Status: History of cognitive impairments - at baseline                                 General Comments: heavily sleeping upon arrival, however awakens to verbal/tactile cues. Has difficulty following commands at this time        Exercises Other Exercises Other Exercises: attempted to perform HEP, however unable to coordinate facilitate meaningful task.    General Comments        Pertinent Vitals/Pain Pain Assessment Pain Assessment: No/denies pain    Home Living                          Prior Function            PT Goals (current goals can now be found in the  care plan section) Acute Rehab PT Goals Patient Stated Goal: unable to state PT Goal Formulation: Patient unable to participate in goal setting Time For Goal Achievement: 07/18/22 Potential to Achieve Goals: Fair Progress towards PT goals: Not progressing toward goals - comment    Frequency    Min 2X/week      PT Plan Current plan remains appropriate    Co-evaluation              AM-PAC PT "6 Clicks" Mobility   Outcome Measure  Help needed turning from your back to your side while in a flat bed without using bedrails?: Total Help needed moving from lying on your back to sitting on the side of a flat bed without using bedrails?: Total Help needed moving to and from a bed to a chair (including a wheelchair)?:  Total Help needed standing up from a chair using your arms (e.g., wheelchair or bedside chair)?: Total Help needed to walk in hospital room?: Total Help needed climbing 3-5 steps with a railing? : Total 6 Click Score: 6    End of Session   Activity Tolerance: Patient limited by lethargy Patient left: in bed;with bed alarm set (notified staff that pt is awake and wanting breakfast tray) Nurse Communication: Mobility status PT Visit Diagnosis: Unsteadiness on feet (R26.81);Muscle weakness (generalized) (M62.81);Difficulty in walking, not elsewhere classified (R26.2)     Time: [id number] PT Time Calculation (min) (ACUTE ONLY): 11 min  Charges:  $Therapeutic Activity: 8-22 mins                     [name], PT, DPT, GCS [phone]    Tashala Cumbo 07/18/2022, 1:18 PM

## 2022-07-19 DIAGNOSIS — F03918 Unspecified dementia, unspecified severity, with other behavioral disturbance: Secondary | ICD-10-CM | POA: Diagnosis not present

## 2022-07-19 NOTE — Progress Notes (Signed)
Pt became slightly agitated around 0200, prn oral haldol given with some relief of agitation. Louanne Skye 07/19/22 5:41 AM

## 2022-07-19 NOTE — Progress Notes (Signed)
Progress Note   Patient: Larry Buckley DOB: 1941/01/29 DOA: July 15, 2022     9 DOS: the patient was seen and examined on 07/19/2022   Brief hospital course: Larry Buckley is a 81 y.o. male with medical history significant for dementia with behavioral disturbance, hypertension, hyperlipidemia, AAA with repair, gout, who initially presented to [facility] ED after aggressive and combative behavior at the group home.  He was brought into the ED on 15-Jul-2022 via EMS under IVC.  He has been staying in the ED, seen by psychiatry and social worker, awaiting placement.  Admitted to hospitalist service on 9/19 as patient has increased weakness. Patient has significant agitation, barely eating.  Perative care consult obtained, currently working with DSS for goal of care.   9/27: Was agitated earlier required Geodon.  9/28: TOC working on placement 9/29: Required one-time Geodon this afternoon for agitation.  Psychiatry following and increased his dose of Risperdal and Seroquel today 9/30: Per nursing note patient was combative, agitated and attempting to get out of the bed last night - sleepy this morning 10/1: Urology consulted for hematuria and change of Foley catheter 10/2-10/3: Waiting for placement. 10/4: Patient remained stable.  Waiting for placement.  Patient need LTC placement.  DSS is involved. Palliative care will follow-up as an outpatient.    Assessment and Plan: * Dementia with behavioral disturbance [facility]) Appreciate psych input.  Continue Depakote, Remeron, Seroquel, Risperdal and as needed Haldol.  Psychiatry increased the dose of Risperdal and Seroquel on 9/29  Patient continues to have intermittent agitation at night/sundowning.   Essential hypertension Continue Norvasc  Hematuria Patient pulled out Foley catheter on the night of 9/30 leading to reinsertion by nurse overnight but patient was continued to have gross hematuria so urology consult was obtained on 10/1.   Appreciate urology intervention the catheter was in the wrong passage and has been changed over and has been irrigating well    Protein-calorie malnutrition, severe Complicates overall prognosis.  Failure to thrive in adult Complicates overall prognosis.  Palliative care and TOC team is working with DSS for placement and goals of care  Central sleep apnea Monitor     Subjective: Patient was seen and examined today.  No new complaints.  Physical Exam: Vitals:   07/18/22 2204 07/19/22 0332 07/19/22 0803 07/19/22 1552  BP: (!) 123/55 (!) 169/67 131/60 127/71  Pulse: 73 79 (!) 55 63  Resp: 18 20 18 18   Temp: 99.8 F (37.7 C)  97.8 F (36.6 C) 97.8 F (36.6 C)  TempSrc: Oral     SpO2:    100%  Weight:       General.  Ill-appearing elderly man, in no acute distress. Pulmonary.  Lungs clear bilaterally, normal respiratory effort. CV.  Regular rate and rhythm, no JVD, rub or murmur. Abdomen.  Soft, nontender, nondistended, BS positive. CNS.  Alert and oriented to self.  No focal neurologic deficit. Extremities.  No edema, no cyanosis, pulses intact and symmetrical. Psychiatry.  Judgment and insight appears impaired  Data Reviewed: Prior data reviewed  Family Communication: Patient has a legal guardian  Disposition: Status is: Inpatient Remains inpatient appropriate because: Unsafe discharge, difficult disposition as patient need memory care unit   Planned Discharge Destination: Skilled nursing facility  Time spent: 40 minutes  This record has been created using Systems analyst. Errors have been sought and corrected,but may not always be located. Such creation errors do not reflect on the standard of care.  Author: Soundra Pilon  Reesa Chew, MD 07/19/2022 5:06 PM  For on call review [email or web address].

## 2022-07-19 NOTE — Progress Notes (Signed)
[facility]) Hospital Liaison note:  This patient is currently enrolled in [facility] outpatient-based Palliative Care. Will continue to follow for disposition.  Please call with any outpatient palliative questions or concerns.  Thank you, Lorelee Market, LPN [facility] Psychiatric Liaison [phone]

## 2022-07-19 NOTE — Progress Notes (Signed)
Nutrition Follow Up Note   DOCUMENTATION CODES:   Severe malnutrition in context of social or environmental circumstances  INTERVENTION:   Ensure Enlive po TID, each supplement provides 350 kcal and 20 grams of protein.  Magic cup TID with meals, each supplement provides 290 kcal and 9 grams of protein  MVI po daily   Dysphagia 3 diet   Pt at high refeed risk; recommend monitor potassium, magnesium and phosphorus labs daily until stable  Assist with meals   NUTRITION DIAGNOSIS:   Severe Malnutrition related to social / environmental circumstances as evidenced by severe fat depletion, severe muscle depletion, 8 percent weight loss in 2 months.  GOAL:   Patient will meet greater than or equal to 90% of their needs -not met   MONITOR:   PO intake, Supplement acceptance, Labs, Weight trends, Skin, I & O's  ASSESSMENT:   81 y.o. male with medical history significant for dementia with behavioral disturbance, hypertension, hyperlipidemia, AAA with repair and gout who is admitted after aggressive and combative behavior at the group home.  Pt with improved appetite and oral intake; pt eating 50-100% of meals with meal assistance. Pt is being offered Ensure and Magic Cups. Pt remains on appetite stimulant. No weight since 9/21; pt is ordered for weekly weights but none documented. Pt is currently pending placement to memory care unit.    Medications reviewed and include: megace, melatonin, remeron, MVI, miralax, senokot  Labs reviewed:   Diet Order:   Diet Order             DIET DYS 3 Room service appropriate? No; Fluid consistency: Thin  Diet effective now                  EDUCATION NEEDS:   No education needs have been identified at this time  Skin:  Skin Assessment: Reviewed RN Assessment  Last BM:  10/1- type 2  Height:   Ht Readings from Last 1 Encounters:  06/08/22 _0  (1.88 m)    Weight:   Wt Readings from Last 1 Encounters:  07/06/22 82 kg     Ideal Body Weight:  86.3 kg  BMI:  Body mass index is 23.21 kg/m.  Estimated Nutritional Needs:   Kcal:  2100-2400kcal/day  Protein:  105-120g/day  Fluid:  2.1-2.4L/day  Koleen Distance MS, RD, LDN Please refer to [facility] for RD and/or RD on-call/weekend/after hours pager

## 2022-07-20 DIAGNOSIS — F03918 Unspecified dementia, unspecified severity, with other behavioral disturbance: Secondary | ICD-10-CM | POA: Diagnosis not present

## 2022-07-20 NOTE — Progress Notes (Signed)
Mobility Specialist - Progress Note   07/20/22 1600  Mobility  Activity Ambulated with assistance in hallway  Activity Response Tolerated well  Distance Ambulated (ft) 60 ft  $Mobility charge 1 Mobility  Level of Assistance Minimal assist, patient does 75% or more  Assistive Device Front wheel walker  Mobility Referral Yes     Pt lying in bed upon arrival, utilizing RA. Able to come into sitting with minA+1. STS from EOB with modA+2 and extra time needed for weight shifting forward. Multi-modal cueing for hand placement. Redirection needed. MinA to facilitate RW for continued ambulation and sequencing steps; especially during turns. Short, shuffled gait. Pt returned to bed with alarm set, needs in reach.    Kathee Delton Mobility Specialist 07/20/22, 4:06 PM

## 2022-07-20 NOTE — Progress Notes (Signed)
Progress Note   Patient: Larry Buckley EYC:[id number] DOB: [birth year] DOA: 07/12/2022     10 DOS: the patient was seen and examined on 07/20/2022   Brief hospital course: Larry Buckley is a 81 y.o. male with medical history significant for dementia with behavioral disturbance, hypertension, hyperlipidemia, AAA with repair, gout, who initially presented to [facility] ED after aggressive and combative behavior at the group home.  He was brought into the ED on 06/26/2022 via EMS under IVC.  He has been staying in the ED, seen by psychiatry and social worker, awaiting placement.  Admitted to hospitalist service on 9/19 as patient has increased weakness. Patient has significant agitation, barely eating.  Perative care consult obtained, currently working with DSS for goal of care.   9/27: Was agitated earlier required Geodon.  9/28: TOC working on placement 9/29: Required one-time Geodon this afternoon for agitation.  Psychiatry following and increased his dose of Risperdal and Seroquel today 9/30: Per nursing note patient was combative, agitated and attempting to get out of the bed last night - sleepy this morning 10/1: Urology consulted for hematuria and change of Foley catheter 10/2-10/3: Waiting for placement. 10/4: Patient remained stable.  Waiting for placement.  Patient need LTC placement.  DSS is involved. Palliative care will follow-up as an outpatient. 10/5: Remains stable.  Still waiting for placement.   Assessment and Plan: * Dementia with behavioral disturbance [name]) Appreciate psych input.  Continue Depakote, Remeron, Seroquel, Risperdal and as needed Haldol.  Psychiatry increased the dose of Risperdal and Seroquel on 9/29  Patient continues to have intermittent agitation at night/sundowning.   Essential hypertension Continue Norvasc  Hematuria Patient pulled out Foley catheter on the night of 9/30 leading to reinsertion by nurse overnight but patient was continued to have gross  hematuria so urology consult was obtained on 10/1.  Appreciate urology intervention the catheter was in the wrong passage and has been changed over and has been irrigating well    Protein-calorie malnutrition, severe Complicates overall prognosis.  Failure to thrive in adult Complicates overall prognosis.  Palliative care and TOC team is working with DSS for placement and goals of care  Central sleep apnea Monitor     Subjective: Patient was seen and examined today.  No new complaints.  He was asking some help with the lunch.  Physical Exam: Vitals:   07/19/22 2037 07/20/22 0419 07/20/22 1051 07/20/22 1525  BP: 110/64 120/60 (!) 146/70 134/69  Pulse: 78 64 78 75  Resp: 16 14 18    Temp: 97.8 F (36.6 C) 97.8 F (36.6 C) 97.9 F (36.6 C) 99 F (37.2 C)  TempSrc: Oral Oral Axillary   SpO2: 100% 100% 96% 97%  Weight:       General.  Cognitively impaired elderly man, in no acute distress. Pulmonary.  Lungs clear bilaterally, normal respiratory effort. CV.  Regular rate and rhythm, no JVD, rub or murmur. Abdomen.  Soft, nontender, nondistended, BS positive. CNS.  Alert and oriented .  No focal neurologic deficit. Extremities.  No edema, no cyanosis, pulses intact and symmetrical. Psychiatry.  Judgment and insight appears impaired  Data Reviewed: Prior data reviewed  Family Communication: Patient has a legal guardian  Disposition: Status is: Inpatient Remains inpatient appropriate because: Unsafe discharge, difficult disposition as patient need memory care unit   Planned Discharge Destination: Skilled nursing facility  Time spent: 38 minutes  This record has been created using . Errors have been sought and corrected,but may not  always be located. Such creation errors do not reflect on the standard of care.  Author: Lorella Nimrod, MD 07/20/2022 4:33 PM  For on call review [email or web address].

## 2022-07-21 DIAGNOSIS — F03918 Unspecified dementia, unspecified severity, with other behavioral disturbance: Secondary | ICD-10-CM | POA: Diagnosis not present

## 2022-07-21 NOTE — TOC Progression Note (Signed)
Transition of Care [facility]) - Progression Note    Patient Details  Name: Larry Buckley MRN: [id number] Date of Birth: 1941-01-17  Transition of Care [facility]) CM/SW Joanna, LCSW Phone Number: 07/21/2022, 1:39 PM  Clinical Narrative:   Tawni Carnes to legal guardian. She is going to ask [employer] to assess him.  Expected Discharge Plan: Memory Care Barriers to Discharge: Other (must enter comment) (Facility will not accept patient back)  Expected Discharge Plan and Services Expected Discharge Plan: Memory Care   Discharge Planning Services: CM Consult   Living arrangements for the past 2 months: Group Home                 DME Arranged: N/A DME Agency: NA       HH Arranged: NA HH Agency: NA         Social Determinants of Health (SDOH) Interventions    Readmission Risk Interventions     No data to display

## 2022-07-21 NOTE — Progress Notes (Signed)
AAOx1. VSS. O2 on room air, maintaining sats > 92%. Denied pain, no s/s of distress.  Foley cath cont drain blood tinged urine. Pt safety risks identified, addressed, and maintained to prevent injury. Comfort and hygiene measures provided, q1 intentional rounding/safety checks completed and hygiene assistance provided as needed. RN and NT intermittently sitting with patient as he was restless throughout the night. Bed in lowest position. Fresh water kept at bedside as well. Call light within reach. Will continue to monitor and endorse.

## 2022-07-21 NOTE — Progress Notes (Signed)
Progress Note   Patient: Larry Buckley DOB: 06-23-1941 DOA: 06/16/2022     11 DOS: the patient was seen and examined on 07/21/2022   Brief hospital course: Larry Buckley is a 81 y.o. male with medical history significant for dementia with behavioral disturbance, hypertension, hyperlipidemia, AAA with repair, gout, who initially presented to [facility] ED after aggressive and combative behavior at the group home.  He was brought into the ED on 07/09/2022 via EMS under IVC.  He has been staying in the ED, seen by psychiatry and social worker, awaiting placement.  Admitted to hospitalist service on 9/19 as patient has increased weakness. Patient has significant agitation, barely eating.  Perative care consult obtained, currently working with DSS for goal of care.   9/27: Was agitated earlier required Geodon.  9/28: TOC working on placement 9/29: Required one-time Geodon this afternoon for agitation.  Psychiatry following and increased his dose of Risperdal and Seroquel today 9/30: Per nursing note patient was combative, agitated and attempting to get out of the bed last night - sleepy this morning 10/1: Urology consulted for hematuria and change of Foley catheter 10/2-10/3: Waiting for placement. 10/4: Patient remained stable.  Waiting for placement.  Patient need LTC placement.  DSS is involved. Palliative care will follow-up as an outpatient. 10/5: Remains stable.  Still waiting for placement. 10/6: No change today.No news on placement.   Assessment and Plan: * Dementia with behavioral disturbance [facility]) Appreciate psych input.  Continue Depakote, Remeron, Seroquel, Risperdal and as needed Haldol.  Psychiatry increased the dose of Risperdal and Seroquel on 9/29  Patient continues to have intermittent agitation at night/sundowning.   Essential hypertension Continue Norvasc  Hematuria Patient pulled out Foley catheter on the night of 9/30 leading to reinsertion by nurse  overnight but patient was continued to have gross hematuria so urology consult was obtained on 10/1.  Appreciate urology intervention the catheter was in the wrong passage and has been changed over and has been irrigating well    Protein-calorie malnutrition, severe Complicates overall prognosis.  Failure to thrive in adult Complicates overall prognosis.  Palliative care and TOC team is working with DSS for placement and goals of care  Central sleep apnea Monitor     Subjective: Patient was trying to get out of bed when seen this morning.  Had to call nursing staff to help to prevent any injury or pull on his Foley catheter.  Physical Exam: Vitals:   07/20/22 1923 07/21/22 0341 07/21/22 0958 07/21/22 1408  BP: (!) 128/59 137/60 122/65 (!) 105/54  Pulse: 89 76 72 72  Resp: 18 17 16 14   Temp: 98.4 F (36.9 C)  98.2 F (36.8 C) 98.1 F (36.7 C)  TempSrc:    Axillary  SpO2: 99% 99% 99% 98%  Weight:       General.     In no acute distress. Pulmonary.  Lungs clear bilaterally, normal respiratory effort. CV.  Regular rate and rhythm, no JVD, rub or murmur. Abdomen.  Soft, nontender, nondistended, BS positive. CNS.  Alert and oriented .  No focal neurologic deficit. Extremities.  No edema, no cyanosis, pulses intact and symmetrical. Psychiatry.  Judgment and insight appears impaired.  Data Reviewed: Prior data reviewed  Family Communication: Patient has a legal guardian  Disposition: Status is: Inpatient Remains inpatient appropriate because: Unsafe discharge, difficult disposition as patient need memory care unit   Planned Discharge Destination: Skilled nursing facility  Time spent: 37 minutes  This record has  been created using Systems analyst. Errors have been sought and corrected,but may not always be located. Such creation errors do not reflect on the standard of care.  Author: Lorella Nimrod, MD 07/21/2022 4:02 PM  For on call review  [email or web address].

## 2022-07-22 DIAGNOSIS — F03918 Unspecified dementia, unspecified severity, with other behavioral disturbance: Secondary | ICD-10-CM | POA: Diagnosis not present

## 2022-07-22 NOTE — Progress Notes (Signed)
Progress Note   Patient: Larry Buckley WUJ:[id number] DOB: 1941/03/18 DOA: 06/26/2022     12 DOS: the patient was seen and examined on 07/22/2022   Brief hospital course: Larry Buckley is a 81 y.o. male with medical history significant for dementia with behavioral disturbance, hypertension, hyperlipidemia, AAA with repair, gout, who initially presented to [facility] ED after aggressive and combative behavior at the group home.  He was brought into the ED on 07/11/2022 via EMS under IVC.  He has been staying in the ED, seen by psychiatry and social worker, awaiting placement.  Admitted to hospitalist service on 9/19 as patient has increased weakness. Patient has significant agitation, barely eating.  Perative care consult obtained, currently working with DSS for goal of care.   9/27: Was agitated earlier required Geodon.  9/28: TOC working on placement 9/29: Required one-time Geodon this afternoon for agitation.  Psychiatry following and increased his dose of Risperdal and Seroquel today 9/30: Per nursing note patient was combative, agitated and attempting to get out of the bed last night - sleepy this morning 10/1: Urology consulted for hematuria and change of Foley catheter 10/2-10/3: Waiting for placement. 10/4: Patient remained stable.  Waiting for placement.  Patient need LTC placement.  DSS is involved. Palliative care will follow-up as an outpatient. 10/5: Remains stable.  Still waiting for placement. 10/6: No change today.No news on placement.   Assessment and Plan: * Dementia with behavioral disturbance [facility]) Appreciate psych input.  Continue Depakote, Remeron, Seroquel, Risperdal and as needed Haldol.  Psychiatry increased the dose of Risperdal and Seroquel on 9/29  Patient continues to have intermittent agitation at night/sundowning.   Essential hypertension Continue Norvasc  Hematuria Patient pulled out Foley catheter on the night of 9/30 leading to reinsertion by nurse  overnight but patient was continued to have gross hematuria so urology consult was obtained on 10/1.  Appreciate urology intervention the catheter was in the wrong passage and has been changed over and has been irrigating well    Protein-calorie malnutrition, severe Complicates overall prognosis.  Failure to thrive in adult Complicates overall prognosis.  Palliative care and TOC team is working with DSS for placement and goals of care  Central sleep apnea Monitor     Subjective: Patient was sleeping comfortably when seen today.  No new concerns.  Physical Exam: Vitals:   07/21/22 0958 07/21/22 1408 07/21/22 1921 07/22/22 0702  BP: 122/65 (!) 105/54 (!) 119/54 (!) 124/48  Pulse: 72 72 83 (!) 59  Resp: 16 14 16 16   Temp: 98.2 F (36.8 C) 98.1 F (36.7 C) 99.5 F (37.5 C) (!) 96.9 F (36.1 C)  TempSrc:  Axillary Oral   SpO2: 99% 98% 100% 99%  Weight:       General.     In no acute distress. Pulmonary.  Lungs clear bilaterally, normal respiratory effort. CV.  Regular rate and rhythm, no JVD, rub or murmur. Abdomen.  Soft, nontender, nondistended, BS positive. CNS.  Alert and oriented .  No focal neurologic deficit. Extremities.  No edema, no cyanosis, pulses intact and symmetrical. Psychiatry.  Judgment and insight appears impaired.  Data Reviewed: Prior data reviewed  Family Communication: Patient has a legal guardian  Disposition: Status is: Inpatient Remains inpatient appropriate because: Unsafe discharge, difficult disposition as patient need memory care unit   Planned Discharge Destination: Skilled nursing facility  Time spent: 35 minutes  This record has been created using . Errors have been sought and corrected,but  may not always be located. Such creation errors do not reflect on the standard of care.  Author: Lorella Nimrod, MD 07/22/2022 2:47 PM  For on call review [email or web address].

## 2022-07-23 DIAGNOSIS — F03918 Unspecified dementia, unspecified severity, with other behavioral disturbance: Secondary | ICD-10-CM | POA: Diagnosis not present

## 2022-07-23 NOTE — Progress Notes (Signed)
Progress Note   Patient: Larry Buckley:[id number] DOB: 11-06-1940 DOA: 06/19/2022     13 DOS: the patient was seen and examined on 07/23/2022   Brief hospital course: Larry Buckley is a 81 y.o. male with medical history significant for dementia with behavioral disturbance, hypertension, hyperlipidemia, AAA with repair, gout, who initially presented to [facility] ED after aggressive and combative behavior at the group home.  He was brought into the ED on 06/21/2022 via EMS under IVC.  He has been staying in the ED, seen by psychiatry and social worker, awaiting placement.  Admitted to hospitalist service on 9/19 as patient has increased weakness. Patient has significant agitation, barely eating.  Perative care consult obtained, currently working with DSS for goal of care.   9/27: Was agitated earlier required Geodon.  9/28: TOC working on placement 9/29: Required one-time Geodon this afternoon for agitation.  Psychiatry following and increased his dose of Risperdal and Seroquel today 9/30: Per nursing note patient was combative, agitated and attempting to get out of the bed last night - sleepy this morning 10/1: Urology consulted for hematuria and change of Foley catheter 10/2-10/3: Waiting for placement. 10/4: Patient remained stable.  Waiting for placement.  Patient need LTC placement.  DSS is involved. Palliative care will follow-up as an outpatient. 10/5: Remains stable.  Still waiting for placement. 10/6: No change today.No news on placement. 10/8: Remains stable.  Still waiting for placement   Assessment and Plan: * Dementia with behavioral disturbance [facility]) Appreciate psych input.  Continue Depakote, Remeron, Seroquel, Risperdal and as needed Haldol.  Psychiatry increased the dose of Risperdal and Seroquel on 9/29  Patient continues to have intermittent agitation at night/sundowning.   Essential hypertension Continue Norvasc  Hematuria Patient pulled out Foley catheter on the  night of 9/30 leading to reinsertion by nurse overnight but patient was continued to have gross hematuria so urology consult was obtained on 10/1.  Appreciate urology intervention the catheter was in the wrong passage and has been changed over and has been irrigating well    Protein-calorie malnutrition, severe Complicates overall prognosis.  Failure to thrive in adult Complicates overall prognosis.  Palliative care and TOC team is working with DSS for placement and goals of care  Central sleep apnea Monitor     Subjective: Patient was lying comfortably in bed.  No new complaints.  Physical Exam: Vitals:   07/22/22 1544 07/22/22 1919 07/23/22 0238 07/23/22 0733  BP: (!) 110/45 (!) 160/74 (!) 167/75 117/72  Pulse: (!) 47 83 93 75  Resp:  16 16 16   Temp:  98.4 F (36.9 C) 98.4 F (36.9 C) 97.8 F (36.6 C)  TempSrc:  Oral Oral   SpO2: 91% 100% 98% 99%  Weight:       General.  Elderly gentleman, in no acute distress. Pulmonary.  Lungs clear bilaterally, normal respiratory effort. CV.  Regular rate and rhythm, no JVD, rub or murmur. Abdomen.  Soft, nontender, nondistended, BS positive. CNS.  Alert and oriented .  No focal neurologic deficit. Extremities.  No edema, no cyanosis, pulses intact and symmetrical. Psychiatry.  Judgment and insight appears impaired.  Data Reviewed: Prior data reviewed  Family Communication: Patient has a legal guardian  Disposition: Status is: Inpatient Remains inpatient appropriate because: Unsafe discharge, difficult disposition as patient need memory care unit   Planned Discharge Destination: Skilled nursing facility  Time spent: 35 minutes  This record has been created using . Errors have been sought  and corrected,but may not always be located. Such creation errors do not reflect on the standard of care.  Author: Lorella Nimrod, MD 07/23/2022 2:13 PM  For on call review [email or web address].

## 2022-07-24 DIAGNOSIS — F03918 Unspecified dementia, unspecified severity, with other behavioral disturbance: Secondary | ICD-10-CM | POA: Diagnosis not present

## 2022-07-24 NOTE — Progress Notes (Signed)
Progress Note   Patient: Larry Buckley:[id number] DOB: 09-Jul-1941 DOA: 07/14/2022     14 DOS: the patient was seen and examined on 07/24/2022   Brief hospital course: Larry Buckley is a 81 y.o. male with medical history significant for dementia with behavioral disturbance, hypertension, hyperlipidemia, AAA with repair, gout, who initially presented to [facility] ED after aggressive and combative behavior at the group home.  He was brought into the ED on 07/12/2022 via EMS under IVC.  He has been staying in the ED, seen by psychiatry and social worker, awaiting placement.  Admitted to hospitalist service on 9/19 as patient has increased weakness. Patient has significant agitation, barely eating.  Perative care consult obtained, currently working with DSS for goal of care.   9/27: Was agitated earlier required Geodon.  9/28: TOC working on placement 9/29: Required one-time Geodon this afternoon for agitation.  Psychiatry following and increased his dose of Risperdal and Seroquel today 9/30: Per nursing note patient was combative, agitated and attempting to get out of the bed last night - sleepy this morning 10/1: Urology consulted for hematuria and change of Foley catheter 10/2-10/3: Waiting for placement. 10/4: Patient remained stable.  Waiting for placement.  Patient need LTC placement.  DSS is involved. Palliative care will follow-up as an outpatient. 10/5: Remains stable.  Still waiting for placement. 10/6: No change today.No news on placement. 10/8: Remains stable.  Still waiting for placement   Assessment and Plan: * Dementia with behavioral disturbance [facility]) Appreciate psych input.  Continue Depakote, Remeron, Seroquel, Risperdal and as needed Haldol.  Psychiatry increased the dose of Risperdal and Seroquel on 9/29  Patient continues to have intermittent agitation at night/sundowning.   Essential hypertension Continue Norvasc  Hematuria Patient pulled out Foley catheter on the  night of 9/30 leading to reinsertion by nurse overnight but patient was continued to have gross hematuria so urology consult was obtained on 10/1.  Appreciate urology intervention the catheter was in the wrong passage and has been changed over and has been irrigating well    Protein-calorie malnutrition, severe Complicates overall prognosis.  Failure to thrive in adult Complicates overall prognosis.  Palliative care and TOC team is working with DSS for placement and goals of care  Central sleep apnea Monitor     Subjective: Patient was seen today.  No new concerns.  Physical Exam: Vitals:   07/23/22 0733 07/23/22 1516 07/23/22 1921 07/24/22 0633  BP: 117/72 (!) 144/79 (!) 149/83 (!) 157/73  Pulse: 75 (!) 55 78 65  Resp: 16 18 20 16   Temp: 97.8 F (36.6 C) 97.7 F (36.5 C) 98.6 F (37 C) 97.9 F (36.6 C)  TempSrc:  Oral Oral Oral  SpO2: 99% 94% 96% 100%  Weight:       General.  Elderly gentleman, in no acute distress. Pulmonary.  Lungs clear bilaterally, normal respiratory effort. CV.  Regular rate and rhythm, no JVD, rub or murmur. Abdomen.  Soft, nontender, nondistended, BS positive. CNS.  Alert and oriented .  No focal neurologic deficit. Extremities.  No edema, no cyanosis, pulses intact and symmetrical. Psychiatry.  Judgment and insight appears impaired.  Data Reviewed: Prior data reviewed  Family Communication: Patient has a legal guardian  Disposition: Status is: Inpatient Remains inpatient appropriate because: Unsafe discharge, difficult disposition as patient need memory care unit   Planned Discharge Destination: Skilled nursing facility  Time spent: 32 minutes  This record has been created using . Errors have been  sought and corrected,but may not always be located. Such creation errors do not reflect on the standard of care.  Author: Lorella Nimrod, MD 07/24/2022 1:47 PM  For on call review [email or web address].

## 2022-07-25 DIAGNOSIS — F03918 Unspecified dementia, unspecified severity, with other behavioral disturbance: Secondary | ICD-10-CM | POA: Diagnosis not present

## 2022-07-25 LAB — GLUCOSE, CAPILLARY: Glucose-Capillary: 70 mg/dL (ref 70–99)

## 2022-07-25 NOTE — Plan of Care (Signed)

## 2022-07-25 NOTE — Plan of Care (Signed)
Pt is alert and oriented to self. Pt med compliant. HS meds effective in relaxing patient and helping him rest during overnight. VS stable. Foley intact draining clear yellow urine.  Problem: Education: Goal: Knowledge of General Education information will improve Description: Including pain rating scale, medication(s)/side effects and non-pharmacologic comfort measures Outcome: Progressing   Problem: Health Behavior/Discharge Planning: Goal: Ability to manage health-related needs will improve Outcome: Progressing   Problem: Clinical Measurements: Goal: Ability to maintain clinical measurements within normal limits will improve Outcome: Progressing Goal: Will remain free from infection Outcome: Progressing Goal: Diagnostic test results will improve Outcome: Progressing Goal: Respiratory complications will improve Outcome: Progressing Goal: Cardiovascular complication will be avoided Outcome: Progressing   Problem: Activity: Goal: Risk for activity intolerance will decrease Outcome: Progressing   Problem: Nutrition: Goal: Adequate nutrition will be maintained Outcome: Progressing   Problem: Coping: Goal: Level of anxiety will decrease Outcome: Progressing   Problem: Elimination: Goal: Will not experience complications related to bowel motility Outcome: Progressing Goal: Will not experience complications related to urinary retention Outcome: Progressing   Problem: Pain Managment: Goal: General experience of comfort will improve Outcome: Progressing   Problem: Safety: Goal: Ability to remain free from injury will improve Outcome: Progressing   Problem: Skin Integrity: Goal: Risk for impaired skin integrity will decrease Outcome: Progressing

## 2022-07-25 NOTE — Progress Notes (Signed)
Progress Note   Patient: Larry Buckley DOB: July 17, 1941 DOA: 06/21/2022     15 DOS: the patient was seen and examined on 07/25/2022   Brief hospital course: Larry Buckley is a 81 y.o. male with medical history significant for dementia with behavioral disturbance, hypertension, hyperlipidemia, AAA with repair, gout, who initially presented to [facility] ED after aggressive and combative behavior at the group home.  He was brought into the ED on 07/14/2022 via EMS under IVC.  He has been staying in the ED, seen by psychiatry and social worker, awaiting placement.  Admitted to hospitalist service on 9/19 as patient has increased weakness. Patient has significant agitation, barely eating.  Perative care consult obtained, currently working with DSS for goal of care.   9/27: Was agitated earlier required Geodon.  9/28: TOC working on placement 9/29: Required one-time Geodon this afternoon for agitation.  Psychiatry following and increased his dose of Risperdal and Seroquel today 9/30: Per nursing note patient was combative, agitated and attempting to get out of the bed last night - sleepy this morning 10/1: Urology consulted for hematuria and change of Foley catheter 10/2-10/3: Waiting for placement. 10/4: Patient remained stable.  Waiting for placement.  Patient need LTC placement.  DSS is involved. Palliative care will follow-up as an outpatient. 10/5: Remains stable.  Still waiting for placement. 10/6: No change today.No news on placement. 10/8: Remains stable.  Still waiting for placement   Assessment and Plan: * Dementia with behavioral disturbance Larry Buckley) Appreciate psych input.  Continue Depakote, Remeron, Seroquel, Risperdal and as needed Haldol.  Psychiatry increased the dose of Risperdal and Seroquel on 9/29  Patient continues to have intermittent agitation at night/sundowning.   Essential hypertension Continue Norvasc  Hematuria Patient pulled out Foley catheter on the  night of 9/30 leading to reinsertion by nurse overnight but patient was continued to have gross hematuria so urology consult was obtained on 10/1.  Appreciate urology intervention the catheter was in the wrong passage and has been changed over and has been irrigating well    Protein-calorie malnutrition, severe Complicates overall prognosis.  Failure to thrive in adult Complicates overall prognosis.  Palliative care and TOC team is working with DSS for placement and goals of care  Central sleep apnea Monitor     Subjective: Patient was sleeping and does not want to be disturbed.  Physical Exam: Vitals:   07/24/22 0633 07/24/22 2014 07/25/22 0526 07/25/22 0908  BP: (!) 157/73 (!) 146/90 (!) 143/66 118/62  Pulse: 65 (!) 106 63 70  Resp: 16 16 15 16   Temp: 97.9 F (36.6 C) 99.1 F (37.3 C) 98.6 F (37 C) 98 F (36.7 C)  TempSrc: Oral Oral Oral Oral  SpO2: 100% 97% 100% 98%  Weight:       General.  Elderly gentleman, in no acute distress. Pulmonary.  Lungs clear bilaterally, normal respiratory effort. CV.  Regular rate and rhythm, no JVD, rub or murmur. Abdomen.  Soft, nontender, nondistended, BS positive. CNS.  Alert and oriented .  No focal neurologic deficit. Extremities.  No edema, no cyanosis, pulses intact and symmetrical. Psychiatry.  Judgment and insight appears impaired.  Data Reviewed: Prior data reviewed  Family Communication: Patient has a legal guardian  Disposition: Status is: Inpatient Remains inpatient appropriate because: Unsafe discharge, difficult disposition as patient need memory care unit   Planned Discharge Destination: Skilled nursing facility  Time spent: 30 minutes  This record has been created using Systems analyst. Errors  have been sought and corrected,but may not always be located. Such creation errors do not reflect on the standard of care.  Author: Arnetha Courser, MD 07/25/2022 3:33 PM  For on call review  [email or web address].

## 2022-07-26 DIAGNOSIS — F03918 Unspecified dementia, unspecified severity, with other behavioral disturbance: Secondary | ICD-10-CM | POA: Diagnosis not present

## 2022-07-26 MED ORDER — TAMSULOSIN HCL 0.4 MG PO CAPS
0.4000 mg | ORAL_CAPSULE | Freq: Every day | ORAL | Status: DC
  Administered 2022-07-26 – 2022-10-02 (×68): 0.4 mg via ORAL
  Filled 2022-07-26 (×67): qty 1

## 2022-07-26 NOTE — TOC Progression Note (Addendum)
Transition of Care [facility]) - Progression Note    Patient Details  Name: Larry Buckley MRN: [id number] Date of Birth: [birth year]  Transition of Care [facility]) CM/SW [place], LCSW Phone Number: 07/26/2022, 11:40 AM  Clinical Narrative:   Guardian is checking status of [place] coming to assess patient.  1:42 pm: Per RN, [place] staff was here assessing patient earlier today.  Expected Discharge Plan: Memory Care Barriers to Discharge: Other (must enter comment) (Facility will not accept patient back)  Expected Discharge Plan and Services Expected Discharge Plan: Memory Care   Discharge Planning Services: CM Consult   Living arrangements for the past 2 months: Group Home                 DME Arranged: N/A DME Agency: NA       HH Arranged: NA HH Agency: NA         Social Determinants of Health (SDOH) Interventions    Readmission Risk Interventions     No data to display

## 2022-07-26 NOTE — Progress Notes (Signed)
Nutrition Follow Up Note   DOCUMENTATION CODES:   Severe malnutrition in context of social or environmental circumstances  INTERVENTION:   Ensure Enlive po TID, each supplement provides 350 kcal and 20 grams of protein.  Magic cup TID with meals, each supplement provides 290 kcal and 9 grams of protein  MVI po daily   Dysphagia 3 diet   Assist with meals   NUTRITION DIAGNOSIS:   Severe Malnutrition related to social / environmental circumstances as evidenced by severe fat depletion, severe muscle depletion, 8 percent weight loss in 2 months.  GOAL:   Patient will meet greater than or equal to 90% of their needs -progressing   MONITOR:   PO intake, Supplement acceptance, Labs, Weight trends, Skin, I & O's  ASSESSMENT:   81 y.o. male with medical history significant for dementia with behavioral disturbance, hypertension, hyperlipidemia, AAA with repair and gout who is admitted after aggressive and combative behavior at the group home.  Pt with inconsistent appetite and oral intake; pt eating anywhere from 0-100% of meals but does better with meal assistance. Pt is being offered Ensure and Magic Cups. Pt remains on appetite stimulants. No weight since 9/21; pt is ordered for weekly weights but none documented. Pt is currently pending placement to memory care unit.    Medications reviewed and include: megace, melatonin, remeron, MVI, miralax, senokot  Labs reviewed:   Diet Order:   Diet Order             DIET DYS 3 Room service appropriate? No; Fluid consistency: Thin  Diet effective now                  EDUCATION NEEDS:   No education needs have been identified at this time  Skin:  Skin Assessment: Reviewed RN Assessment  Last BM:  10/11- type 6  Height:   Ht Readings from Last 1 Encounters:  06/08/22 6\' 2"  (1.88 m)    Weight:   Wt Readings from Last 1 Encounters:  07/06/22 82 kg    Ideal Body Weight:  86.3 kg  BMI:  Body mass index is 23.21  kg/m.  Estimated Nutritional Needs:   Kcal:  2100-2400kcal/day  Protein:  105-120g/day  Fluid:  2.1-2.4L/day  Koleen Distance MS, RD, LDN Please refer to [facility] for RD and/or RD on-call/weekend/after hours pager

## 2022-07-26 NOTE — Progress Notes (Signed)
Progress Note   Patient: Larry Buckley [id number] DOB: 23-Jan-1941 DOA: 07/06/2022     16 DOS: the patient was seen and examined on 07/26/2022   Brief hospital course: Larry Buckley is a 81 y.o. male with medical history significant for dementia with behavioral disturbance, hypertension, hyperlipidemia, AAA with repair, gout, who initially presented to [facility] ED after aggressive and combative behavior at the group home.  He was brought into the ED on 07/05/2022 via EMS under IVC.  He has been staying in the ED, seen by psychiatry and social worker, awaiting placement.  Admitted to hospitalist service on 9/19 as patient has increased weakness. Patient has significant agitation, barely eating.  Perative care consult obtained, currently working with DSS for goal of care.   9/27: Was agitated earlier required Geodon.  9/28: TOC working on placement 9/29: Required one-time Geodon this afternoon for agitation.  Psychiatry following and increased his dose of Risperdal and Seroquel today 9/30: Per nursing note patient was combative, agitated and attempting to get out of the bed last night - sleepy this morning 10/1: Urology consulted for hematuria and change of Foley catheter 10/2-10/3: Waiting for placement. 10/4: Patient remained stable.  Waiting for placement.  Patient need LTC placement.  DSS is involved. Palliative care will follow-up as an outpatient. 10/5: Remains stable.  Still waiting for placement. 10/6: No change today.No news on placement. 10/8: Remains stable.  Still waiting for placement 10/11: Patient remained stable.  Larry Buckley evaluated him, if we can remove Foley catheter.  On chart review could not find a reason for Foley catheter except urology note on 10/1 stating that keep Foley for 1 week for concern of falls posterior passage created during an attempt to replace Foley catheter when he accidentally pulled it out causing hematuria.  It has been more than 1 week, we will  remove Foley and give him a voiding trial. Also added Flomax   Assessment and Plan: * Dementia with behavioral disturbance [facility]) Appreciate psych input.  Continue Depakote, Remeron, Seroquel, Risperdal and as needed Haldol.  Psychiatry increased the dose of Risperdal and Seroquel on 9/29  Patient continues to have intermittent agitation at night/sundowning.   Essential hypertension Continue Norvasc  Hematuria Resolved. Patient pulled out Foley catheter on the night of 9/30 leading to reinsertion by nurse overnight but patient was continued to have gross hematuria so urology consult was obtained on 10/1.  Appreciate urology intervention the catheter was in the wrong passage and has been changed over and has been irrigating well. Not sure why he had Foley catheter.  Urology was recommending keeping Foley in for 1 week which has been completed. -Remove Foley catheter and give him a voiding trial. -Start him on Flomax    Protein-calorie malnutrition, severe Complicates overall prognosis.  Failure to thrive in adult Complicates overall prognosis.  Palliative care and TOC team is working with DSS for placement and goals of care  Central sleep apnea Monitor     Subjective: Patient was resting comfortably when seen today.  No new complaints.  Physical Exam: Vitals:   07/25/22 0908 07/25/22 2014 07/26/22 0536 07/26/22 0901  BP: 118/62 118/60 (!) 141/69 (!) 125/56  Pulse: 70 75 66 61  Resp: 16 18 16 18   Temp: 98 F (36.7 C) 98.2 F (36.8 C) 98.9 F (37.2 C) 98.3 F (36.8 C)  TempSrc: Oral Oral Axillary   SpO2: 98% 100% 98%   Weight:       General.  Frail elderly  man, in no acute distress. Pulmonary.  Lungs clear bilaterally, normal respiratory effort. CV.  Regular rate and rhythm, no JVD, rub or murmur. Abdomen.  Soft, nontender, nondistended, BS positive. CNS.  Alert and oriented .  No focal neurologic deficit. Extremities.  No edema, no cyanosis, pulses intact and  symmetrical. Psychiatry.  Judgment and insight appears impaired.  Data Reviewed: Prior data reviewed  Family Communication: Patient has a legal guardian  Disposition: Status is: Inpatient Remains inpatient appropriate because: Unsafe discharge, difficult disposition as patient need memory care unit   Planned Discharge Destination: Skilled nursing facility  Time spent: 35 minutes  This record has been created using Conservation officer, historic buildings. Errors have been sought and corrected,but may not always be located. Such creation errors do not reflect on the standard of care.  Author: Arnetha Courser, MD 07/26/2022 12:57 PM  For on call review [email or web address].

## 2022-07-26 NOTE — Assessment & Plan Note (Addendum)
Resolved

## 2022-07-27 DIAGNOSIS — F03918 Unspecified dementia, unspecified severity, with other behavioral disturbance: Secondary | ICD-10-CM | POA: Diagnosis not present

## 2022-07-27 DIAGNOSIS — I10 Essential (primary) hypertension: Secondary | ICD-10-CM | POA: Diagnosis not present

## 2022-07-27 DIAGNOSIS — R627 Adult failure to thrive: Secondary | ICD-10-CM | POA: Diagnosis not present

## 2022-07-27 DIAGNOSIS — N3000 Acute cystitis without hematuria: Secondary | ICD-10-CM | POA: Diagnosis not present

## 2022-07-27 NOTE — Progress Notes (Addendum)
Mobility Specialist - Progress Note   07/27/22 1100  Mobility  Activity Ambulated with assistance in hallway  Level of Assistance Moderate assist, patient does 50-74%  Assistive Device Front wheel walker  Distance Ambulated (ft) 30 ft  Activity Response Tolerated well  $Mobility charge 1 Mobility     Pt lying in bed upon arrival, utilizing RA. Pt came into sitting EOB with maxA; Pryor Curia has witnessed pt being able to complete bed mobility on his own but limited by cognition this date. Intermittently following commands. MaxA STS and VC for hand placement. Extra time needed for anterior weight shifting. Flexed trunk. Narrow BOS and difficulty demonstrating proper biomechanics with RW. Assist for facilitating movement and navigating RW. Pt left in bed with alarm set, needs in reach.    Kathee Delton Mobility Specialist 07/27/22, 11:44 AM

## 2022-07-27 NOTE — Progress Notes (Signed)
Progress Note   Patient: Larry Buckley VFI:[id number] DOB: 08/05/1941 DOA: 07-19-22     17 DOS: the patient was seen and examined on 07/27/2022   Brief hospital course: Larry Buckley is a 81 y.o. male with medical history significant for dementia with behavioral disturbance, hypertension, hyperlipidemia, AAA with repair, gout, who initially presented to [facility] ED after aggressive and combative behavior at the group home.  He was brought into the ED on July 19, 2022 via EMS under IVC.  He has been staying in the ED, seen by psychiatry and social worker, awaiting placement.  Admitted to hospitalist service on 9/19 as patient has increased weakness. Patient has significant agitation, barely eating.  Perative care consult obtained, currently working with DSS for goal of care.   9/27: Was agitated earlier required Geodon.  9/28: TOC working on placement 9/29: Required one-time Geodon this afternoon for agitation.  Psychiatry following and increased his dose of Risperdal and Seroquel today 9/30: Per nursing note patient was combative, agitated and attempting to get out of the bed last night - sleepy this morning 10/1: Urology consulted for hematuria and change of Foley catheter 10/2-10/3: Waiting for placement. 10/4: Patient remained stable.  Waiting for placement.  Patient need LTC placement.  DSS is involved. Palliative care will follow-up as an outpatient. 10/5: Remains stable.  Still waiting for placement. 10/6: No change today.No news on placement. 10/8: Remains stable.  Still waiting for placement 10/11: Patient remained stable.  [place] [facility] evaluated him, if we can remove Foley catheter.  On chart review could not find a reason for Foley catheter except urology note on 10/1 stating that keep Foley for 1 week for concern of falls posterior passage created during an attempt to replace Foley catheter when he accidentally pulled it out causing hematuria.  It has been more than 1 week, we will  remove Foley and give him a voiding trial. Also added Flomax 10/12: No agitation was reported overnight.  Hematuria resolved.   Assessment and Plan: * Dementia with behavioral disturbance [facility]) Appreciate psych input.  Continue Depakote, Remeron, Seroquel, Risperdal and as needed Haldol.  Psychiatry increased the dose of Risperdal and Seroquel on 9/29  Patient continues to have intermittent agitation at night/sundowning.  Last 24 hours no reports of agitation.  Essential hypertension Continue Norvasc  Hematuria Resolved. Patient pulled out Foley catheter on the night of 9/30 leading to reinsertion by nurse overnight but patient was continued to have gross hematuria so urology consult was obtained on 10/1.  Appreciate urology intervention the catheter was in the wrong passage and has been changed over and has been irrigating well. Not sure why he had Foley catheter.  Urology was recommending keeping Foley in for 1 week which has been completed. -Remove Foley catheter and give him a voiding trial. -ContinueFlomax    Protein-calorie malnutrition, severe Complicates overall prognosis.  Failure to thrive in adult Complicates overall prognosis.  Palliative care and TOC team is working with DSS for placement and goals of care  Central sleep apnea Monitor     Subjective: Patient was seen and examined at bedside.  Patient had no new.  No events reported overnight by the nurse. Physical Exam: Vitals:   07/26/22 0901 07/26/22 1612 07/26/22 2048 07/27/22 0728  BP: (!) 125/56 (!) 131/48 121/60 122/60  Pulse: 61 (!) 55 72 65  Resp: 18 18 17 14   Temp: 98.3 F (36.8 C) 98.3 F (36.8 C) 98.7 F (37.1 C)   TempSrc:   Oral  SpO2:   100% 100%  Weight:       General.  Frail elderly man, in no acute distress. Pulmonary.  Lungs clear bilaterally, normal respiratory effort. CV.  Regular rate and rhythm, no JVD, rub or murmur. Abdomen.  Soft, nontender, nondistended, BS positive. CNS.   Alert and oriented .  No focal neurologic deficit. Extremities.  No edema, no cyanosis, pulses intact and symmetrical. Psychiatry.  Judgment and insight appears impaired.  Data Reviewed: Prior data reviewed  Family Communication: Patient has a legal guardian  Disposition: Status is: Inpatient Remains inpatient appropriate because: Unsafe discharge, difficult disposition as patient need memory care unit   Planned Discharge Destination: Skilled nursing facility  Time spent: 35 minutes  This record has been created using Systems analyst. Errors have been sought and corrected,but may not always be located. Such creation errors do not reflect on the standard of care.  Author: Carlyle Lipa, MD 07/27/2022 4:06 PM  For on call review [email or web address].

## 2022-07-27 NOTE — TOC Progression Note (Signed)
Transition of Care [facility]) - Progression Note    Patient Details  Name: BREYDON SENTERS MRN: [id number] Date of Birth: 1940-12-14  Transition of Care [facility]) CM/SW [place], LCSW Phone Number: 07/27/2022, 10:23 AM  Clinical Narrative:   Left message for guardian to see if she received any updates from [facility] after yesterday's assessment.  Expected Discharge Plan: Memory Care Barriers to Discharge: Other (must enter comment) (Facility will not accept patient back)  Expected Discharge Plan and Services Expected Discharge Plan: Memory Care   Discharge Planning Services: CM Consult   Living arrangements for the past 2 months: Group Home                 DME Arranged: N/A DME Agency: NA       HH Arranged: NA HH Agency: NA         Social Determinants of Health (SDOH) Interventions    Readmission Risk Interventions     No data to display

## 2022-07-28 DIAGNOSIS — F03918 Unspecified dementia, unspecified severity, with other behavioral disturbance: Secondary | ICD-10-CM | POA: Diagnosis not present

## 2022-07-28 NOTE — Plan of Care (Signed)

## 2022-07-28 NOTE — Progress Notes (Signed)
Progress Note   Patient: Larry Buckley [id number] DOB: Sep 13, 1941 DOA: Jul 22, 2022     18 DOS: the patient was seen and examined on 07/28/2022   Brief hospital course: ORLANDO DEVEREUX is a 81 y.o. male with medical history significant for dementia with behavioral disturbance, hypertension, hyperlipidemia, AAA with repair, gout, who initially presented to [facility] ED after aggressive and combative behavior at the group home.  He was brought into the ED on July 22, 2022 via EMS under IVC.  He has been staying in the ED, seen by psychiatry and social worker, awaiting placement.  Admitted to hospitalist service on 9/19 as patient has increased weakness. Patient has significant agitation, barely eating.  Perative care consult obtained, currently working with DSS for goal of care.   9/27: Was agitated earlier required Geodon.  9/28: TOC working on placement 9/29: Required one-time Geodon this afternoon for agitation.  Psychiatry following and increased his dose of Risperdal and Seroquel today 9/30: Per nursing note patient was combative, agitated and attempting to get out of the bed last night - sleepy this morning 10/1: Urology consulted for hematuria and change of Foley catheter 10/2-10/3: Waiting for placement. 10/4: Patient remained stable.  Waiting for placement.  Patient need LTC placement.  DSS is involved. Palliative care will follow-up as an outpatient. 10/5: Remains stable.  Still waiting for placement. 10/6: No change today.No news on placement. 10/8: Remains stable.  Still waiting for placement 10/11: Patient remained stable.  [place] evaluated him, if we can remove Foley catheter.  On chart review could not find a reason for Foley catheter except urology note on 10/1 stating that keep Foley for 1 week for concern of falls posterior passage created during an attempt to replace Foley catheter when he accidentally pulled it out causing hematuria.  It has been more than 1 week, we will  remove Foley and give him a voiding trial. Also added Flomax.  10/13: Foley was removed and patient is voiding well.  We will check postvoid bladder scan to rule out any abnormal retention. [place] evaluated him, pending formal assessment note and acceptance.  No other acute concerns   Assessment and Plan: * Dementia with behavioral disturbance [facility]) Appreciate psych input.  Continue Depakote, Remeron, Seroquel, Risperdal and as needed Haldol.  Psychiatry increased the dose of Risperdal and Seroquel on 9/29  Patient continues to have intermittent agitation at night/sundowning.   Essential hypertension Continue Norvasc  Hematuria Resolved. Foley catheter was removed and patient is voiding well now Patient pulled out Foley catheter on the night of 9/30 leading to reinsertion by nurse overnight but patient was continued to have gross hematuria so urology consult was obtained on 10/1.  Appreciate urology intervention the catheter was in the wrong passage and has been changed over and has been irrigating well. Not sure why he had Foley catheter.  Urology was recommending keeping Foley in for 1 week which has been completed. -Start him on Flomax-we will continue    Protein-calorie malnutrition, severe Complicates overall prognosis.  Failure to thrive in adult Complicates overall prognosis.  Palliative care and TOC team is working with DSS for placement and goals of care  Central sleep apnea Monitor     Subjective: Patient was resting in bed when seen today.  No new concern.  Denies any pain.  Physical Exam: Vitals:   07/27/22 1642 07/27/22 1948 07/28/22 0351 07/28/22 0755  BP: 119/71 (!) 151/65 (!) 140/67 (!) 135/55  Pulse:  73 76 64  Resp: 16 18 18 14   Temp: 98.1 F (36.7 C) 98.7 F (37.1 C) 98 F (36.7 C) (!) 97.5 F (36.4 C)  TempSrc: Oral Oral Oral   SpO2: 99% 98% 99% 100%  Weight:       General.  Frail elderly man, in no acute distress. Pulmonary.  Lungs  clear bilaterally, normal respiratory effort. CV.  Regular rate and rhythm, no JVD, rub or murmur. Abdomen.  Soft, nontender, nondistended, BS positive. CNS.  Alert and oriented .  No focal neurologic deficit. Extremities.  No edema, no cyanosis, pulses intact and symmetrical. Psychiatry.  Judgment and insight appears impaired.  Data Reviewed: Prior data reviewed  Family Communication: Patient has a legal guardian, this is left with legal guardian.  Disposition: Status is: Inpatient Remains inpatient appropriate because: Unsafe discharge, difficult disposition as patient need memory care unit   Planned Discharge Destination: Skilled nursing facility  Time spent: 36 minutes  This record has been created using Systems analyst. Errors have been sought and corrected,but may not always be located. Such creation errors do not reflect on the standard of care.  Author: Lorella Nimrod, MD 07/28/2022 2:24 PM  For on call review [email or web address].

## 2022-07-29 DIAGNOSIS — F03918 Unspecified dementia, unspecified severity, with other behavioral disturbance: Secondary | ICD-10-CM | POA: Diagnosis not present

## 2022-07-29 NOTE — Plan of Care (Signed)

## 2022-07-29 NOTE — Progress Notes (Signed)
Progress Note   Patient: Larry Buckley CVE:[id number] DOB: [birth year] DOA: 07/10/2022     19 DOS: the patient was seen and examined on 07/29/2022   Brief hospital course: Larry Buckley is a 81 y.o. male with medical history significant for dementia with behavioral disturbance, hypertension, hyperlipidemia, AAA with repair, gout, who initially presented to [facility] ED after aggressive and combative behavior at the group home.  He was brought into the ED on 07/04/2022 via EMS under IVC.  He has been staying in the ED, seen by psychiatry and social worker, awaiting placement.  Admitted to hospitalist service on 9/19 as patient has increased weakness. Patient has significant agitation, barely eating.  Perative care consult obtained, currently working with DSS for goal of care.   9/27: Was agitated earlier required Geodon.  9/28: TOC working on placement 9/29: Required one-time Geodon this afternoon for agitation.  Psychiatry following and increased his dose of Risperdal and Seroquel today 9/30: Per nursing note patient was combative, agitated and attempting to get out of the bed last night - sleepy this morning 10/1: Urology consulted for hematuria and change of Foley catheter 10/2-10/3: Waiting for placement. 10/4: Patient remained stable.  Waiting for placement.  Patient need LTC placement.  DSS is involved. Palliative care will follow-up as an outpatient. 10/5: Remains stable.  Still waiting for placement. 10/6: No change today.No news on placement. 10/8: Remains stable.  Still waiting for placement 10/11: Patient remained stable.  [facility] [facility] evaluated him, if we can remove Foley catheter.  On chart review could not find a reason for Foley catheter except urology note on 10/1 stating that keep Foley for 1 week for concern of falls posterior passage created during an attempt to replace Foley catheter when he accidentally pulled it out causing hematuria.  It has been more than 1 week, we will  remove Foley and give him a voiding trial. Also added Flomax.  10/13: Foley was removed and patient is voiding well.  We will check postvoid bladder scan to rule out any abnormal retention. [place] [facility] evaluated him, pending formal assessment note and acceptance.  No other acute concerns.  10/14; still no definitive answers for disposition.  No other concern   Assessment and Plan: * Dementia with behavioral disturbance [facility]) Appreciate psych input.  Continue Depakote, Remeron, Seroquel, Risperdal and as needed Haldol.  Psychiatry increased the dose of Risperdal and Seroquel on 9/29  Patient continues to have intermittent agitation at night/sundowning.   Essential hypertension Continue Norvasc  Hematuria Resolved. Foley catheter was removed and patient is voiding well now Patient pulled out Foley catheter on the night of 9/30 leading to reinsertion by nurse overnight but patient was continued to have gross hematuria so urology consult was obtained on 10/1.  Appreciate urology intervention the catheter was in the wrong passage and has been changed over and has been irrigating well. Not sure why he had Foley catheter.  Urology was recommending keeping Foley in for 1 week which has been completed. -Start him on Flomax-we will continue    Protein-calorie malnutrition, severe Complicates overall prognosis.  Failure to thrive in adult Complicates overall prognosis.  Palliative care and TOC team is working with DSS for placement and goals of care  Central sleep apnea Monitor     Subjective: Patient was resting comfortably when seen today.  No new concern.  Physical Exam: Vitals:   07/28/22 1608 07/28/22 2039 07/29/22 0547 07/29/22 0909  BP: 118/63 (!) 119/53 (!) 140/73 (!) 155/78  Pulse: 82 77 69 64  Resp: 16 18 14 16   Temp: 98.8 F (37.1 C) 98.6 F (37 C) 97.8 F (36.6 C) 97.8 F (36.6 C)  TempSrc: Oral Oral  Oral  SpO2: 100% 100% 98% 100%  Weight:       General.   Frail elderly man, in no acute distress. Pulmonary.  Lungs clear bilaterally, normal respiratory effort. CV.  Regular rate and rhythm, no JVD, rub or murmur. Abdomen.  Soft, nontender, nondistended, BS positive. CNS.  Alert and oriented .  No focal neurologic deficit. Extremities.  No edema, no cyanosis, pulses intact and symmetrical. Psychiatry.  Judgment and insight appears impaired.  Data Reviewed: Prior data reviewed  Family Communication: Patient has a legal guardian, this is left with legal guardian.  Disposition: Status is: Inpatient Remains inpatient appropriate because: Unsafe discharge, difficult disposition as patient need memory care unit   Planned Discharge Destination: Skilled nursing facility  Time spent: 33 minutes  This record has been created using Systems analyst. Errors have been sought and corrected,but may not always be located. Such creation errors do not reflect on the standard of care.  Author: Lorella Nimrod, MD 07/29/2022 4:22 PM  For on call review [email or web address].

## 2022-07-30 DIAGNOSIS — F03918 Unspecified dementia, unspecified severity, with other behavioral disturbance: Secondary | ICD-10-CM | POA: Diagnosis not present

## 2022-07-30 NOTE — Plan of Care (Signed)
  Problem: Education: Goal: Knowledge of General Education information will improve Description: Including pain rating scale, medication(s)/side effects and non-pharmacologic comfort measures Outcome: Not Progressing   Problem: Health Behavior/Discharge Planning: Goal: Ability to manage health-related needs will improve Outcome: Not Progressing   

## 2022-07-30 NOTE — Progress Notes (Signed)
Progress Note   Patient: Larry Buckley:[id number] DOB: Sep 12, 1941 DOA: 06/23/2022     20 DOS: the patient was seen and examined on 07/30/2022   Brief hospital course: Larry Buckley is a 81 y.o. male with medical history significant for dementia with behavioral disturbance, hypertension, hyperlipidemia, AAA with repair, gout, who initially presented to [facility] ED after aggressive and combative behavior at the group home.  He was brought into the ED on 07/03/2022 via EMS under IVC.  He has been staying in the ED, seen by psychiatry and social worker, awaiting placement.  Admitted to hospitalist service on 9/19 as patient has increased weakness. Patient has significant agitation, barely eating.  Perative care consult obtained, currently working with DSS for goal of care.   9/27: Was agitated earlier required Geodon.  9/28: TOC working on placement 9/29: Required one-time Geodon this afternoon for agitation.  Psychiatry following and increased his dose of Risperdal and Seroquel today 9/30: Per nursing note patient was combative, agitated and attempting to get out of the bed last night - sleepy this morning 10/1: Urology consulted for hematuria and change of Foley catheter 10/2-10/3: Waiting for placement. 10/4: Patient remained stable.  Waiting for placement.  Patient need LTC placement.  DSS is involved. Palliative care will follow-up as an outpatient. 10/5: Remains stable.  Still waiting for placement. 10/6: No change today.No news on placement. 10/8: Remains stable.  Still waiting for placement 10/11: Patient remained stable.  [place] evaluated him, if we can remove Foley catheter.  On chart review could not find a reason for Foley catheter except urology note on 10/1 stating that keep Foley for 1 week for concern of falls posterior passage created during an attempt to replace Foley catheter when he accidentally pulled it out causing hematuria.  It has been more than 1 week, we will  remove Foley and give him a voiding trial. Also added Flomax.  10/13: Foley was removed and patient is voiding well.  We will check postvoid bladder scan to rule out any abnormal retention. [place] evaluated him, pending formal assessment note and acceptance.  No other acute concerns.  10/14; still no definitive answers for disposition.  No other concern   Assessment and Plan: * Dementia with behavioral disturbance [facility]) Appreciate psych input.  Continue Depakote, Remeron, Seroquel, Risperdal and as needed Haldol.  Psychiatry increased the dose of Risperdal and Seroquel on 9/29  Patient continues to have intermittent agitation at night/sundowning.   Essential hypertension Continue Norvasc  Hematuria Resolved. Foley catheter was removed and patient is voiding well now Patient pulled out Foley catheter on the night of 9/30 leading to reinsertion by nurse overnight but patient was continued to have gross hematuria so urology consult was obtained on 10/1.  Appreciate urology intervention the catheter was in the wrong passage and has been changed over and has been irrigating well. Not sure why he had Foley catheter.  Urology was recommending keeping Foley in for 1 week which has been completed. -Start him on Flomax-we will continue    Protein-calorie malnutrition, severe Complicates overall prognosis.  Failure to thrive in adult Complicates overall prognosis.  Palliative care and TOC team is working with DSS for placement and goals of care  Central sleep apnea Monitor     Subjective: No new concern.  Physical Exam: Vitals:   07/29/22 1947 07/29/22 1949 07/30/22 0600 07/30/22 0900  BP: (!) 147/71  139/78 (!) 146/71  Pulse: 89 91 89 80  Resp: 16  17 17  Temp: 99.5 F (37.5 C)  98.8 F (37.1 C) 98.7 F (37.1 C)  TempSrc: Oral  Oral Oral  SpO2: (!) 81% 100% 100% 100%  Weight:       General.  Frail elderly man, in no acute distress. Pulmonary.  Lungs clear  bilaterally, normal respiratory effort. CV.  Regular rate and rhythm, no JVD, rub or murmur. Abdomen.  Soft, nontender, nondistended, BS positive. CNS.  Alert and oriented .  No focal neurologic deficit. Extremities.  No edema, no cyanosis, pulses intact and symmetrical. Psychiatry.  Judgment and insight appears impaired.  Data Reviewed: Prior data reviewed  Family Communication: Patient has a legal guardian, this is left with legal guardian.  Disposition: Status is: Inpatient Remains inpatient appropriate because: Unsafe discharge, difficult disposition as patient need memory care unit   Planned Discharge Destination: Skilled nursing facility  Time spent: 30 minutes  This record has been created using Conservation officer, historic buildings. Errors have been sought and corrected,but may not always be located. Such creation errors do not reflect on the standard of care.  Author: Arnetha Courser, MD 07/30/2022 1:40 PM  For on call review [email or web address].

## 2022-07-31 DIAGNOSIS — N3000 Acute cystitis without hematuria: Secondary | ICD-10-CM | POA: Diagnosis not present

## 2022-07-31 DIAGNOSIS — R627 Adult failure to thrive: Secondary | ICD-10-CM | POA: Diagnosis not present

## 2022-07-31 DIAGNOSIS — I10 Essential (primary) hypertension: Secondary | ICD-10-CM | POA: Diagnosis not present

## 2022-07-31 DIAGNOSIS — F03918 Unspecified dementia, unspecified severity, with other behavioral disturbance: Secondary | ICD-10-CM | POA: Diagnosis not present

## 2022-07-31 NOTE — Progress Notes (Signed)
Progress Note   Patient: Larry Buckley ZJI:[id number] DOB: 1941/06/23 DOA: 06/22/2022     21 DOS: the patient was seen and examined on 07/31/2022   Brief hospital course: Larry Buckley is a 81 y.o. male with medical history significant for dementia with behavioral disturbance, hypertension, hyperlipidemia, AAA with repair, gout, who initially presented to [facility] ED after aggressive and combative behavior at the group home.  He was brought into the ED on 06/22/2022 via EMS under IVC.  He has been staying in the ED, seen by psychiatry and social worker, awaiting placement.  Admitted to hospitalist service on 9/19 as patient has increased weakness. Patient has significant agitation, barely eating.  Perative care consult obtained, currently working with DSS for goal of care.   9/27: Was agitated earlier required Geodon.  9/28: TOC working on placement 9/29: Required one-time Geodon this afternoon for agitation.  Psychiatry following and increased his dose of Risperdal and Seroquel today 9/30: Per nursing note patient was combative, agitated and attempting to get out of the bed last night - sleepy this morning 10/1: Urology consulted for hematuria and change of Foley catheter 10/2-10/3: Waiting for placement. 10/4: Patient remained stable.  Waiting for placement.  Patient need LTC placement.  DSS is involved. Palliative care will follow-up as an outpatient. 10/5: Remains stable.  Still waiting for placement. 10/6: No change today.No news on placement. 10/8: Remains stable.  Still waiting for placement 10/11: Patient remained stable.  [facility] evaluated him, if we can remove Foley catheter.  On chart review could not find a reason for Foley catheter except urology note on 10/1 stating that keep Foley for 1 week for concern of falls posterior passage created during an attempt to replace Foley catheter when he accidentally pulled it out causing hematuria.  It has been more than 1 week, we will  remove Foley and give him a voiding trial. Also added Flomax.  10/13: Foley was removed and patient is voiding well.  We will check postvoid bladder scan to rule out any abnormal retention. [place] [facility] evaluated him, pending formal assessment note and acceptance.  No other acute concerns.  10/14; still no definitive answers for disposition.  No other concern  10/15: No new events patient still awaiting for disposition, plan for memory care unit   Assessment and Plan: * Dementia with behavioral disturbance [facility]) Appreciate psych input.  Continue Depakote, Remeron, Seroquel, Risperdal and as needed Haldol.  Psychiatry increased the dose of Risperdal and Seroquel on 9/29  Patient continues to have intermittent agitation at night/sundowning.   Essential hypertension Continue Norvasc  Hematuria Resolved. Foley catheter was removed and patient is voiding well now Patient pulled out Foley catheter on the night of 9/30 leading to reinsertion by nurse overnight but patient was continued to have gross hematuria so urology consult was obtained on 10/1.  Appreciate urology intervention the catheter was in the wrong passage and has been changed over and has been irrigating well. Not sure why he had Foley catheter.  Urology was recommending keeping Foley in for 1 week which has been completed. -Continue Flomax    Protein-calorie malnutrition, severe Complicates overall prognosis.  Failure to thrive in adult Complicates overall prognosis.  Palliative care and TOC team is working with DSS for placement and goals of care  Central sleep apnea Monitor     Subjective: No new concern.  Physical Exam: Vitals:   07/30/22 0900 07/30/22 2011 07/31/22 0535 07/31/22 0843  BP: (!) 146/71 (!) 148/66 [facility])  159/76 (!) 158/66  Pulse: 80 80 83 77  Resp: 17 16 16 18   Temp: 98.7 F (37.1 C) 98.1 F (36.7 C) 97.9 F (36.6 C) (!) 97.5 F (36.4 C)  TempSrc: Oral   Oral  SpO2: 100% (!) 70% 100% 100%   Weight:       General.  Frail elderly man, in no acute distress. Pulmonary.  Lungs clear bilaterally, normal respiratory effort. CV.  Regular rate and rhythm, no JVD, rub or murmur. Abdomen.  Soft, nontender, nondistended, BS positive. CNS.  Alert and oriented .  No focal neurologic deficit. Extremities.  No edema, no cyanosis, pulses intact and symmetrical. Psychiatry.  Judgment and insight appears impaired.  Data Reviewed: Prior data reviewed  Family Communication: Patient has a legal guardian, this is left with legal guardian.  Disposition: Status is: Inpatient Remains inpatient appropriate because: Unsafe discharge, difficult disposition as patient need memory care unit   Planned Discharge Destination: Skilled nursing facility  Time spent: 30 minutes  This record has been created using Systems analyst. Errors have been sought and corrected,but may not always be located. Such creation errors do not reflect on the standard of care.  Author: Carlyle Lipa, MD 07/31/2022 2:05 PM  For on call review [email or web address].

## 2022-07-31 NOTE — TOC Progression Note (Signed)
Transition of Care [facility]) - Progression Note    Patient Details  Name: Larry Buckley MRN: [id number] Date of Birth: 1941-03-12  Transition of Care [facility]) CM/SW Contact  Beverly Sessions, RN Phone Number: 07/31/2022, 3:50 PM  Clinical Narrative:     Secure Email sent to [facility] with DSS to determine is there is an update from [facility]   Expected Discharge Plan: Memory Care Barriers to Discharge: Other (must enter comment) (Facility will not accept patient back)  Expected Discharge Plan and Services Expected Discharge Plan: Memory Care   Discharge Planning Services: CM Consult   Living arrangements for the past 2 months: Group Home                 DME Arranged: N/A DME Agency: NA       HH Arranged: NA HH Agency: NA         Social Determinants of Health (SDOH) Interventions    Readmission Risk Interventions     No data to display

## 2022-07-31 NOTE — Plan of Care (Signed)
  Problem: Education: Goal: Knowledge of General Education information will improve Description: Including pain rating scale, medication(s)/side effects and non-pharmacologic comfort measures Outcome: Not Progressing   Problem: Health Behavior/Discharge Planning: Goal: Ability to manage health-related needs will improve Outcome: Not Progressing   

## 2022-07-31 NOTE — Progress Notes (Signed)
Mobility Specialist - Progress Note   07/31/22 1500  Mobility  Activity Ambulated with assistance in hallway;Transferred from bed to chair;Transferred from chair to bed  Level of Assistance Moderate assist, patient does 50-74%  Assistive Device Front wheel walker  Distance Ambulated (ft) 30 ft  Activity Response Tolerated well  $Mobility charge 1 Mobility     Pt lying in bed upon arrival, utilizing RA. Pt completed bed mobility with modA. STS with maxA +2 and extra time. Flexed posture throughout activity, but does require multi-modal cues for anterior weight shifting in standing. Ambulated hallway modA with assist needed to facilitate RW and initiate turns. Pt returned to chair for linen change and peri-hygiene before return to bed. Pt left in bed with alarm set, needs in reach.    Larry Buckley Mobility Specialist 07/31/22, 3:47 PM

## 2022-08-01 DIAGNOSIS — R627 Adult failure to thrive: Secondary | ICD-10-CM | POA: Diagnosis not present

## 2022-08-01 DIAGNOSIS — F03918 Unspecified dementia, unspecified severity, with other behavioral disturbance: Secondary | ICD-10-CM | POA: Diagnosis not present

## 2022-08-01 DIAGNOSIS — N3000 Acute cystitis without hematuria: Secondary | ICD-10-CM | POA: Diagnosis not present

## 2022-08-01 DIAGNOSIS — I10 Essential (primary) hypertension: Secondary | ICD-10-CM | POA: Diagnosis not present

## 2022-08-01 NOTE — Progress Notes (Signed)
Progress Note   Patient: Larry Buckley NID:[id number] DOB: 01/23/1941 DOA: 07/08/2022     22 DOS: the patient was seen and examined on 08/01/2022   Brief hospital course: DAVYN MORANDI is a 81 y.o. male with medical history significant for dementia with behavioral disturbance, hypertension, hyperlipidemia, AAA with repair, gout, who initially presented to [facility] ED after aggressive and combative behavior at the group home.  He was brought into the ED on 06/16/2022 via EMS under IVC.  He has been staying in the ED, seen by psychiatry and social worker, awaiting placement.  Admitted to hospitalist service on 9/19 as patient has increased weakness. Patient has significant agitation, barely eating.  Perative care consult obtained, currently working with DSS for goal of care.   9/27: Was agitated earlier required Geodon.  9/28: TOC working on placement 9/29: Required one-time Geodon this afternoon for agitation.  Psychiatry following and increased his dose of Risperdal and Seroquel today 9/30: Per nursing note patient was combative, agitated and attempting to get out of the bed last night - sleepy this morning 10/1: Urology consulted for hematuria and change of Foley catheter 10/2-10/3: Waiting for placement. 10/4: Patient remained stable.  Waiting for placement.  Patient need LTC placement.  DSS is involved. Palliative care will follow-up as an outpatient. 10/5: Remains stable.  Still waiting for placement. 10/6: No change today.No news on placement. 10/8: Remains stable.  Still waiting for placement 10/11: Patient remained stable.  [place] [facility] evaluated him, if we can remove Foley catheter.  On chart review could not find a reason for Foley catheter except urology note on 10/1 stating that keep Foley for 1 week for concern of falls posterior passage created during an attempt to replace Foley catheter when he accidentally pulled it out causing hematuria.  It has been more than 1 week, we will  remove Foley and give him a voiding trial. Also added Flomax.  10/13: Foley was removed and patient is voiding well.  We will check postvoid bladder scan to rule out any abnormal retention. [place] [facility] evaluated him, pending formal assessment note and acceptance.  No other acute concerns.  10/14; still no definitive answers for disposition.  No other concern  10/15: No new events patient still awaiting for disposition, plan for memory care unit 10/17 : No new events, patient at times gets irritable.  Currently awaiting disposition in the memory care unit.   Assessment and Plan: * Dementia with behavioral disturbance [facility]) Appreciate psych input.  Continue Depakote, Remeron, Seroquel, Risperdal and as needed Haldol.  Psychiatry increased the dose of Risperdal and Seroquel on 9/29  Patient intermittently gets irritable and at night/sundowning.   Essential hypertension Continue Norvasc  Hematuria Resolved. Foley catheter was removed and patient is voiding well now Patient pulled out Foley catheter on the night of 9/30 leading to reinsertion by nurse overnight but patient was continued to have gross hematuria so urology consult was obtained on 10/1.  Appreciate urology intervention the catheter was in the wrong passage and has been changed over and has been irrigating well. Not sure why he had Foley catheter.  Urology was recommending keeping Foley in for 1 week which has been completed. -Continue Flomax    Protein-calorie malnutrition, severe Complicates overall prognosis.  Failure to thrive in adult Complicates overall prognosis.  Palliative care and TOC team is working with DSS for placement and goals of care  Central sleep apnea Monitor     Subjective: No new concern.  Physical  Exam: Vitals:   08/01/22 0405 08/01/22 0407 08/01/22 0432 08/01/22 0827  BP: (!) 164/93 (!) 161/80 (!) 146/81 134/89  Pulse: (!) 108 (!) 26 70 79  Resp: 16  18 16   Temp: 98.3 F (36.8 C)  98  F (36.7 C) 98.6 F (37 C)  TempSrc:   Oral   SpO2: (!) 89% (!) 87% 100% 91%  Weight:       General.  Frail elderly man, in no acute distress. Pulmonary.  Lungs clear bilaterally, normal respiratory effort. CV.  Regular rate and rhythm, no JVD, rub or murmur. Abdomen.  Soft, nontender, nondistended, BS positive. CNS.  Alert and oriented .  No focal neurologic deficit. Extremities.  No edema, no cyanosis, pulses intact and symmetrical. Psychiatry.  Judgment and insight appears impaired.  Data Reviewed: Prior data reviewed  Family Communication: Patient has a legal guardian, this is left with legal guardian.  Disposition: Status is: Inpatient Remains inpatient appropriate because: Unsafe discharge, difficult disposition as patient need memory care unit   Planned Discharge Destination: Skilled nursing facility  Time spent: 30 minutes  This record has been created using Systems analyst. Errors have been sought and corrected,but may not always be located. Such creation errors do not reflect on the standard of care.  Author: Carlyle Lipa, MD 08/01/2022 2:31 PM  For on call review [email or web address].

## 2022-08-01 NOTE — Progress Notes (Signed)
Mobility Specialist - Progress Note   08/01/22 1225  Mobility  Activity Ambulated with assistance in hallway  Level of Assistance Contact guard assist, steadying assist  Assistive Device Front wheel walker  Distance Ambulated (ft) 60 ft  Activity Response Tolerated well  Mobility Referral Yes  $Mobility charge 1 Mobility   MS responding to bed alarm. Pt sitting EOB upon entry, utilizing RA. Pt STS to RW MinA and AMB CGA. Pt ambulated 60 ft in the hallway, Min VC to continue to push the RW and take full steps. Pt returned to bed with needs within reach and alarm set.   [place]ie [place] Mobility Specialist 08/01/22 12:30 PM

## 2022-08-01 NOTE — Plan of Care (Signed)

## 2022-08-01 NOTE — Progress Notes (Signed)
Nutrition Follow Up Note   DOCUMENTATION CODES:   Severe malnutrition in context of social or environmental circumstances  INTERVENTION:   Ensure Enlive po TID, each supplement provides 350 kcal and 20 grams of protein.  Magic cup TID with meals, each supplement provides 290 kcal and 9 grams of protein  MVI po daily   Dysphagia 3 diet   Assist with meals   NUTRITION DIAGNOSIS:   Severe Malnutrition related to social / environmental circumstances as evidenced by severe fat depletion, severe muscle depletion, 8 percent weight loss in 2 months.  GOAL:   Patient will meet greater than or equal to 90% of their needs -progressing   MONITOR:   PO intake, Supplement acceptance, Labs, Weight trends, Skin, I & O's  ASSESSMENT:   81 y.o. male with medical history significant for dementia with behavioral disturbance, hypertension, hyperlipidemia, AAA with repair and gout who is admitted after aggressive and combative behavior at the group home.  Pt with inconsistent appetite and oral intake; pt eating anywhere from 0-100% of meals but does better with meal assistance. Pt is being offered Ensure and Magic Cups. Pt remains on appetite stimulants. No weight since 9/21; pt is ordered for weekly weights but none documented. Pt is currently pending placement to memory care unit.    Medications reviewed and include: megace, melatonin, remeron, MVI, miralax, senokot  Labs reviewed:   Diet Order:   Diet Order             DIET DYS 3 Room service appropriate? No; Fluid consistency: Thin  Diet effective now                  EDUCATION NEEDS:   No education needs have been identified at this time  Skin:  Skin Assessment: Reviewed RN Assessment  Last BM:  10/17- type 6  Height:   Ht Readings from Last 1 Encounters:  06/08/22 6\' 2"  (1.88 m)    Weight:   Wt Readings from Last 1 Encounters:  07/06/22 82 kg    Ideal Body Weight:  86.3 kg  BMI:  Body mass index is 23.21  kg/m.  Estimated Nutritional Needs:   Kcal:  2100-2400kcal/day  Protein:  105-120g/day  Fluid:  2.1-2.4L/day  Koleen Distance MS, RD, LDN Please refer to [facility] for RD and/or RD on-call/weekend/after hours pager

## 2022-08-02 DIAGNOSIS — F03918 Unspecified dementia, unspecified severity, with other behavioral disturbance: Secondary | ICD-10-CM | POA: Diagnosis not present

## 2022-08-02 LAB — CBC
HCT: 31.8 % — ABNORMAL LOW (ref 39.0–52.0)
Hemoglobin: 10.2 g/dL — ABNORMAL LOW (ref 13.0–17.0)
MCH: 30 pg (ref 26.0–34.0)
MCHC: 32.1 g/dL (ref 30.0–36.0)
MCV: 93.5 fL (ref 80.0–100.0)
Platelets: 413 10*3/uL — ABNORMAL HIGH (ref 150–400)
RBC: 3.4 MIL/uL — ABNORMAL LOW (ref 4.22–5.81)
RDW: 13.8 % (ref 11.5–15.5)
WBC: 6.3 10*3/uL (ref 4.0–10.5)
nRBC: 0 % (ref 0.0–0.2)

## 2022-08-02 LAB — BASIC METABOLIC PANEL
Anion gap: 4 — ABNORMAL LOW (ref 5–15)
BUN: 19 mg/dL (ref 8–23)
CO2: 26 mmol/L (ref 22–32)
Calcium: 8.8 mg/dL — ABNORMAL LOW (ref 8.9–10.3)
Chloride: 110 mmol/L (ref 98–111)
Creatinine, Ser: 0.81 mg/dL (ref 0.61–1.24)
GFR, Estimated: >60 mL/min (ref >60)
Glucose, Bld: 98 mg/dL (ref 70–99)
Potassium: 4.3 mmol/L (ref 3.5–5.1)
Sodium: 140 mmol/L (ref 135–145)

## 2022-08-02 LAB — MAGNESIUM: Magnesium: 2.4 mg/dL (ref 1.7–2.4)

## 2022-08-02 MED ORDER — SENNOSIDES-DOCUSATE SODIUM 8.6-50 MG PO TABS: 1.0000 | ORAL_TABLET | Freq: Every evening | ORAL | Status: DC | PRN

## 2022-08-02 MED ORDER — HYDRALAZINE HCL 20 MG/ML IJ SOLN: 10.0000 mg | INTRAMUSCULAR | Status: DC | PRN

## 2022-08-02 MED ORDER — METOPROLOL TARTRATE 5 MG/5ML IV SOLN: 5.0000 mg | INTRAVENOUS | Status: DC | PRN

## 2022-08-02 MED ORDER — TRAZODONE HCL 50 MG PO TABS
50.0000 mg | ORAL_TABLET | Freq: Every evening | ORAL | Status: DC | PRN
  Administered 2022-08-06 – 2022-09-14 (×23): 50 mg via ORAL
  Filled 2022-08-02 (×23): qty 1

## 2022-08-02 MED ORDER — GUAIFENESIN 100 MG/5ML PO LIQD: 5.0000 mL | ORAL | Status: DC | PRN

## 2022-08-02 MED ORDER — IPRATROPIUM-ALBUTEROL 0.5-2.5 (3) MG/3ML IN SOLN: 3.0000 mL | RESPIRATORY_TRACT | Status: DC | PRN

## 2022-08-02 NOTE — Progress Notes (Signed)
[facility] Hospital Liaison note:  This patient is currently enrolled in [facility] outpatient-based Palliative Care. Will continue to follow for disposition.  Please call with any outpatient palliative questions or concerns.  Thank you, Dee Curry, LPN [facility] Hospital Liaison [phone] 

## 2022-08-02 NOTE — TOC Progression Note (Signed)
Transition of Care [facility]) - Progression Note    Patient Details  Name: Larry Buckley MRN: [id number] Date of Birth: 1941/03/18  Transition of Care [facility]) CM/SW Contact  Beverly Sessions, RN Phone Number: 08/02/2022, 4:19 PM  Clinical Narrative:     Per Barrie Lyme with DSS "I spoke with Anderson Malta and she stated they completed an assessment and he was asleep. She needed some H&P on him. I will create a new email thread and connect you to her."  I have not received an email with the information needed.  Text message sent to directly to [facility] at [place] to see what information is require.  Included [place] on TOC as she will be assigned to patient tomorrow   Expected Discharge Plan: Memory Care Barriers to Discharge: Other (must enter comment) (Facility will not accept patient back)  Expected Discharge Plan and Services Expected Discharge Plan: Memory Care   Discharge Planning Services: CM Consult   Living arrangements for the past 2 months: Group Home                 DME Arranged: N/A DME Agency: NA       HH Arranged: NA HH Agency: NA         Social Determinants of Health (SDOH) Interventions    Readmission Risk Interventions     No data to display

## 2022-08-02 NOTE — Progress Notes (Addendum)
Mobility Specialist - Progress Note    08/02/22 1012  Mobility  Activity Turned to right side;Turned to left side;Turned to back - supine  Level of Assistance Maximum assist, patient does 25-49%  Assistive Device None  Activity Response Tolerated well  Mobility Referral Yes  $Mobility charge 1 Mobility   Pt sleeping and supine upon entry, utilizing RA. Pt is given a bath by author +1, and linen change. +2 for turning left and right due to pt being lethargic, restrictive UE to this date. Pt left with bed in chair position, alarm set and needs within reach.   Candie Mile Mobility Specialist 08/02/22 10:25 AM

## 2022-08-02 NOTE — TOC Progression Note (Signed)
Transition of Care [facility]) - Progression Note    Patient Details  Name: Larry Buckley MRN: [id number] Date of Birth: 1941/05/29  [facility]) CM/SW Contact  Beverly Sessions, RN Phone Number: 08/02/2022, 9:39 AM  Clinical Narrative:     Secure Email sent to [place] supervisor to determine if there is an update on placement   Expected Discharge Plan: Memory Care Barriers to Discharge: Other (must enter comment) (Facility will not accept patient back)  Expected Discharge Plan and Services Expected Discharge Plan: Memory Care   Discharge Planning Services: CM Consult   Living arrangements for the past 2 months: Group Home                 DME Arranged: N/A DME Agency: NA       HH Arranged: NA HH Agency: NA         Social Determinants of Health (SDOH) Interventions    Readmission Risk Interventions     No data to display

## 2022-08-02 NOTE — TOC Progression Note (Signed)
Transition of Care [facility]) - Progression Note    Patient Details  Name: Larry Buckley MRN: [id number] Date of Birth: [birth year]  Transition of Care [facility]) CM/SW Contact  Beverly Sessions, RN Phone Number: 08/02/2022, 4:39 PM  Clinical Narrative:     Secure Emailed last 2 progress notes and 2 recent know where patient worked with mobility tech to [place] at [employer] and [place] with DSS  Notified Anderson Malta that per chart patient does not have foley  She states they will be coming to assess patient tomorrow for the 2nd time and if patient does not cooperate they will not return at 3 rd time for assessment.  Interdisciplinary team notified   Expected Discharge Plan: Memory Care Barriers to Discharge: Other (must enter comment) (Facility will not accept patient back)  Expected Discharge Plan and Services Expected Discharge Plan: Memory Care   Discharge Planning Services: CM Consult   Living arrangements for the past 2 months: Group Home                 DME Arranged: N/A DME Agency: NA       HH Arranged: NA HH Agency: NA         Social Determinants of Health (SDOH) Interventions    Readmission Risk Interventions     No data to display

## 2022-08-02 NOTE — Progress Notes (Signed)
PROGRESS NOTE    Larry Buckley  IZT:[id number] DOB: Dec 08, 1940 DOA: 2022/07/15 PCP: Marjie Skiff, NP   Brief Narrative:  81 y.o. male with medical history significant for dementia with behavioral disturbance, hypertension, hyperlipidemia, AAA with repair, gout, who initially presented to [facility] ED after aggressive and combative behavior at the group home.  He was brought into the ED on 15-Jul-2022 via EMS under IVC.  He has been staying in the ED, seen by psychiatry and social worker, awaiting placement.  Admitted to hospitalist service on 9/19 as patient has increased weakness. Patient has significant agitation, barely eating.  Perative care consult obtained, currently working with DSS for goal of care.  Off-and-on patient has required Geodon.  Psychiatry adjusted Risperdal and Seroquel.  Now overall remained stable awaiting placement.  Hospital course also complicated by urinary retention and Foley catheter placement by urology but eventually patient pulled it out and Flomax was added.   Assessment & Plan:  Principal Problem:   Dementia with behavioral disturbance (HCC) Active Problems:   Essential hypertension   Hematuria   Protein-calorie malnutrition, severe   Failure to thrive in adult   Anorexia   Central sleep apnea   Dementia with behavioral disturbance (HCC) Appreciate psych input.  Continue Depakote, Remeron, Seroquel, Risperdal and as needed Haldol.  Psychiatry increased the dose of Risperdal and Seroquel on 9/29   Patient intermittently gets irritable and at night/sundowning.    Essential hypertension Continue Norvasc   Hematuria, resolved -Initial issues with retention requiring Foley catheter placement but due to blockage urology was consulted who placed Foley catheter was left in for a week and eventually removed.  Now on Flomax     Protein-calorie malnutrition, severe Complicates overall prognosis.   Failure to thrive in adult Complicates overall prognosis.   Palliative care and TOC team is working with DSS for placement and goals of care   Central sleep apnea Monitor   Hyperlipidemia - Statin  Dysphagia 3 diet   DVT prophylaxis:  Code Status:  Family Communication:    Status is: Inpatient Awaiting safe dispo/placement.    Nutritional status    Signs/Symptoms: severe fat depletion, severe muscle depletion, percent weight loss Percent weight loss: 8 %     Body mass index is 23.21 kg/m.         Subjective: Seen and examined at bedside, does not have any complaints at this time.   Examination:  General exam: Appears calm and comfortable, elderly frail Respiratory system: Clear to auscultation. Respiratory effort normal. Cardiovascular system: S1 & S2 heard, RRR. No JVD, murmurs, rubs, gallops or clicks. No pedal edema. Gastrointestinal system: Abdomen is nondistended, soft and nontender. No organomegaly or masses felt. Normal bowel sounds heard. Central nervous system: Alert and oriented. No focal neurological deficits. Extremities: Symmetric 5 x 5 power. Skin: No rashes, lesions or ulcers Psychiatry: Judgement and insight appear poor. Mood & affect appropriate.     Objective: Vitals:   08/01/22 1515 08/01/22 2107 08/02/22 0418 08/02/22 0754  BP: 115/60 (!) 146/76 (!) 144/86 (!) 144/62  Pulse: 71 73 79 75  Resp: 16 18 18 16   Temp: 99 F (37.2 C) 98.4 F (36.9 C) 97.6 F (36.4 C)   TempSrc:  Oral    SpO2: 100% 95%  100%  Weight:       No intake or output data in the 24 hours ending 08/02/22 0834 Filed Weights   07/04/22 2230 07/06/22 1533  Weight: 84 kg 82 kg  Data Reviewed:   CBC: No results for input(s): "WBC", "NEUTROABS", "HGB", "HCT", "MCV", "PLT" in the last 168 hours. Basic Metabolic Panel: No results for input(s): "NA", "K", "CL", "CO2", "GLUCOSE", "BUN", "CREATININE", "CALCIUM", "MG", "PHOS" in the last 168 hours. GFR: Estimated Creatinine Clearance: 71.8 mL/min (by C-G formula  based on SCr of 0.92 mg/dL). Liver Function Tests: No results for input(s): "AST", "ALT", "ALKPHOS", "BILITOT", "PROT", "ALBUMIN" in the last 168 hours. No results for input(s): "LIPASE", "AMYLASE" in the last 168 hours. No results for input(s): "AMMONIA" in the last 168 hours. Coagulation Profile: No results for input(s): "INR", "PROTIME" in the last 168 hours. Cardiac Enzymes: No results for input(s): "CKTOTAL", "CKMB", "CKMBINDEX", "TROPONINI" in the last 168 hours. BNP (last 3 results) No results for input(s): "PROBNP" in the last 8760 hours. HbA1C: No results for input(s): "HGBA1C" in the last 72 hours. CBG: No results for input(s): "GLUCAP" in the last 168 hours. Lipid Profile: No results for input(s): "CHOL", "HDL", "LDLCALC", "TRIG", "CHOLHDL", "LDLDIRECT" in the last 72 hours. Thyroid Function Tests: No results for input(s): "TSH", "T4TOTAL", "FREET4", "T3FREE", "THYROIDAB" in the last 72 hours. Anemia Panel: No results for input(s): "VITAMINB12", "FOLATE", "FERRITIN", "TIBC", "IRON", "RETICCTPCT" in the last 72 hours. Sepsis Labs: No results for input(s): "PROCALCITON", "LATICACIDVEN" in the last 168 hours.  No results found for this or any previous visit (from the past 240 hour(s)).       Radiology Studies: No results found.      Scheduled Meds:  amLODipine  5 mg Oral Daily   atorvastatin  20 mg Oral QHS   divalproex  500 mg Oral BID   feeding supplement  237 mL Oral TID BM   megestrol  400 mg Oral Daily   melatonin  10 mg Oral QHS   mirtazapine  15 mg Oral QHS   multivitamin with minerals  1 tablet Oral Daily   polyethylene glycol  17 g Oral BID   QUEtiapine  100 mg Oral QHS   risperiDONE  1 mg Oral BID   senna-docusate  2 tablet Oral BID   tamsulosin  0.4 mg Oral QPC supper   Continuous Infusions:   LOS: 23 days   Time spent= 35 mins    Reshunda Strider Arsenio Loader, MD [facility]  If 7PM-7AM, please contact night-coverage  08/02/2022, 8:34 AM

## 2022-08-03 DIAGNOSIS — F03918 Unspecified dementia, unspecified severity, with other behavioral disturbance: Secondary | ICD-10-CM | POA: Diagnosis not present

## 2022-08-03 NOTE — Plan of Care (Signed)
°  Problem: Activity: °Goal: Risk for activity intolerance will decrease °Outcome: Progressing °  °Problem: Nutrition: °Goal: Adequate nutrition will be maintained °Outcome: Progressing °  °Problem: Elimination: °Goal: Will not experience complications related to bowel motility °Outcome: Progressing °  °Problem: Safety: °Goal: Ability to remain free from injury will improve °Outcome: Progressing °  °

## 2022-08-03 NOTE — Progress Notes (Signed)
PROGRESS NOTE    Larry Buckley  DGU:[id number] DOB: [birth year] DOA: 06/21/2022 PCP: Marjie Skiff, NP   Brief Narrative:  81 y.o. male with medical history significant for dementia with behavioral disturbance, hypertension, hyperlipidemia, AAA with repair, gout, who initially presented to [facility] ED after aggressive and combative behavior at the group home.  He was brought into the ED on 06/26/2022 via EMS under IVC.  He has been staying in the ED, seen by psychiatry and social worker, awaiting placement.  Admitted to hospitalist service on 9/19 as patient has increased weakness. Patient has significant agitation, barely eating.  Perative care consult obtained, currently working with DSS for goal of care.  Off-and-on patient has required Geodon.  Psychiatry adjusted Risperdal and Seroquel.  Now overall remained stable awaiting placement.  Hospital course also complicated by urinary retention and Foley catheter placement by urology but eventually patient pulled it out and Flomax was added.   Assessment & Plan:  Principal Problem:   Dementia with behavioral disturbance (HCC) Active Problems:   Essential hypertension   Hematuria   Protein-calorie malnutrition, severe   Failure to thrive in adult   Anorexia   Central sleep apnea   Dementia with behavioral disturbance (HCC) Appreciate psych input.  Continue Depakote, Remeron, Seroquel, Risperdal and as needed Haldol.  Psychiatry increased the dose of Risperdal and Seroquel on 9/29   Patient intermittently gets irritable and at night/sundowning.    Essential hypertension Continue Norvasc   Hematuria, resolved -Initial issues with retention requiring Foley catheter placement but due to blockage urology was consulted who placed Foley catheter was left in for a week and eventually removed.  Doing well on Flomax   Protein-calorie malnutrition, severe Complicates overall prognosis.   Failure to thrive in adult Complicates overall  prognosis.  Palliative care and TOC team is working with DSS for placement and goals of care   Central sleep apnea Monitor   Hyperlipidemia - Statin  Dysphagia 3 diet   DVT prophylaxis:  Code Status:  Family Communication:    Status is: Inpatient TOC working on placement Nutritional status    Signs/Symptoms: severe fat depletion, severe muscle depletion, percent weight loss Percent weight loss: 8 %     Body mass index is 23.21 kg/m.         Subjective: Resting comfortably, no complaints this morning. Examination:  Constitutional: Does not appear to be in acute distress Respiratory: Clear to auscultation bilaterally Cardiovascular: Normal sinus rhythm, no rubs Abdomen: Nontender nondistended good bowel sounds Musculoskeletal: No edema noted   Objective: Vitals:   08/02/22 1600 08/02/22 1930 08/03/22 0418 08/03/22 0714  BP:  (!) 141/74 (!) 147/89 121/64  Pulse: 86 (!) 102 67 65  Resp:  18 18 16   Temp:  97.8 F (36.6 C) 98.5 F (36.9 C) (!) 97.1 F (36.2 C)  TempSrc:  Oral Oral Axillary  SpO2:  96% 100% 100%  Weight:        Intake/Output Summary (Last 24 hours) at 08/03/2022 1100 Last data filed at 08/03/2022 0714 Gross per 24 hour  Intake --  Output 600 ml  Net -600 ml   Filed Weights   07/04/22 2230 07/06/22 1533  Weight: 84 kg 82 kg     Data Reviewed:   CBC: Recent Labs  Lab 08/02/22 0914  WBC 6.3  HGB 10.2*  HCT 31.8*  MCV 93.5  PLT 413*   Basic Metabolic Panel: Recent Labs  Lab 08/02/22 0914  NA 140  K 4.3  CL 110  CO2 26  GLUCOSE 98  BUN 19  CREATININE 0.81  CALCIUM 8.8*  MG 2.4   GFR: Estimated Creatinine Clearance: 81.6 mL/min (by C-G formula based on SCr of 0.81 mg/dL). Liver Function Tests: No results for input(s): "AST", "ALT", "ALKPHOS", "BILITOT", "PROT", "ALBUMIN" in the last 168 hours. No results for input(s): "LIPASE", "AMYLASE" in the last 168 hours. No results for input(s): "AMMONIA" in the last  168 hours. Coagulation Profile: No results for input(s): "INR", "PROTIME" in the last 168 hours. Cardiac Enzymes: No results for input(s): "CKTOTAL", "CKMB", "CKMBINDEX", "TROPONINI" in the last 168 hours. BNP (last 3 results) No results for input(s): "PROBNP" in the last 8760 hours. HbA1C: No results for input(s): "HGBA1C" in the last 72 hours. CBG: No results for input(s): "GLUCAP" in the last 168 hours. Lipid Profile: No results for input(s): "CHOL", "HDL", "LDLCALC", "TRIG", "CHOLHDL", "LDLDIRECT" in the last 72 hours. Thyroid Function Tests: No results for input(s): "TSH", "T4TOTAL", "FREET4", "T3FREE", "THYROIDAB" in the last 72 hours. Anemia Panel: No results for input(s): "VITAMINB12", "FOLATE", "FERRITIN", "TIBC", "IRON", "RETICCTPCT" in the last 72 hours. Sepsis Labs: No results for input(s): "PROCALCITON", "LATICACIDVEN" in the last 168 hours.  No results found for this or any previous visit (from the past 240 hour(s)).       Radiology Studies: No results found.      Scheduled Meds:  amLODipine  5 mg Oral Daily   atorvastatin  20 mg Oral QHS   divalproex  500 mg Oral BID   feeding supplement  237 mL Oral TID BM   megestrol  400 mg Oral Daily   melatonin  10 mg Oral QHS   mirtazapine  15 mg Oral QHS   multivitamin with minerals  1 tablet Oral Daily   polyethylene glycol  17 g Oral BID   QUEtiapine  100 mg Oral QHS   risperiDONE  1 mg Oral BID   senna-docusate  2 tablet Oral BID   tamsulosin  0.4 mg Oral QPC supper   Continuous Infusions:   LOS: 24 days   Time spent= 35 mins    Carrie Schoonmaker Arsenio Loader, MD [facility]  If 7PM-7AM, please contact night-coverage  08/03/2022, 11:00 AM

## 2022-08-03 NOTE — Progress Notes (Signed)
Mobility Specialist - Progress Note   08/03/22 1400  Mobility  Activity Stood at bedside  Level of Assistance Moderate assist, patient does 50-74%  Assistive Device Front wheel walker  Distance Ambulated (ft) 0 ft  Activity Response Tolerated well  $Mobility charge 1 Mobility     Pt lying in bed upon arrival, utilizing RA. Pt initially agreeable to ambulation this date, but declines once EOB. Completed bed mobility modA; STS x4 with min-modA. Pt returned to bed with assist onto LE and +2 was provided for L/R rolling during bed linen change. Pt left in bed with alarm set, needs in reach.    Kathee Delton Mobility Specialist 08/03/22, 2:52 PM

## 2022-08-04 DIAGNOSIS — F03918 Unspecified dementia, unspecified severity, with other behavioral disturbance: Secondary | ICD-10-CM | POA: Diagnosis not present

## 2022-08-04 NOTE — Plan of Care (Signed)

## 2022-08-04 NOTE — Progress Notes (Signed)
PROGRESS NOTE    CASE VASSELL  LPF:[id number] DOB: [birth year] DOA: 06/30/2022 PCP: Venita Lick, NP   Brief Narrative:  81 y.o. male with medical history significant for dementia with behavioral disturbance, hypertension, hyperlipidemia, AAA with repair, gout, who initially presented to [facility] ED after aggressive and combative behavior at the group home.  He was brought into the ED on 07/02/2022 via EMS under IVC.  He has been staying in the ED, seen by psychiatry and social worker, awaiting placement.  Admitted to hospitalist service on 9/19 as patient has increased weakness. Patient has significant agitation, barely eating.  Perative care consult obtained, currently working with DSS for goal of care.  Off-and-on patient has required Geodon.  Psychiatry adjusted Risperdal and Seroquel.  Now overall remained stable awaiting placement.  Hospital course also complicated by urinary retention and Foley catheter placement by urology but eventually patient pulled it out and Flomax was added.  TOC team working on placement   Assessment & Plan:  Principal Problem:   Dementia with behavioral disturbance ([place]) Active Problems:   Essential hypertension   Hematuria   Protein-calorie malnutrition, severe   Failure to thrive in adult   Anorexia   Central sleep apnea   Dementia with behavioral disturbance ([place]) Appreciate psych input.  Continue Depakote, Remeron, Seroquel, Risperdal and as needed Haldol.  Psychiatry increased the dose of Risperdal and Seroquel on 9/29   Intermittent irritability and sundowning   Essential hypertension Continue Norvasc   Hematuria, resolved -Initial issues with retention requiring Foley catheter placement but due to blockage urology was consulted who placed Foley catheter was left in for a week and eventually removed.  Doing well with Flomax   Protein-calorie malnutrition, severe Complicates overall prognosis.  Encourage oral intake   Failure to thrive in  adult Complicates overall prognosis.  Palliative care and TOC team is working with DSS for placement and goals of care   Central sleep apnea Monitor   Hyperlipidemia - Statin  Dysphagia 3 diet   DVT prophylaxis: Place and maintain sequential compression device Start: 07/16/22 2211  Code Status: Full code Family Communication:    Status is: Inpatient TOC working on placement Nutritional status    Signs/Symptoms: severe fat depletion, severe muscle depletion, percent weight loss Percent weight loss: 8 %     Body mass index is 23.21 kg/m.     Subjective:  Patient is doing okay, no complaints.  Watching TV in his room  Examination: Constitutional: Not in acute distress Respiratory: Clear to auscultation bilaterally Cardiovascular: Normal sinus rhythm, no rubs Abdomen: Nontender nondistended good bowel sounds Musculoskeletal: No edema noted Skin: No rashes seen Neurologic: CN 2-12 grossly intact.  And nonfocal Psychiatric: Some flat affect    Objective: Vitals:   08/03/22 0714 08/03/22 1647 08/03/22 1936 08/04/22 0246  BP: 121/64 127/61 (!) 140/73 134/66  Pulse: 65 69 99 74  Resp: 16 16 17 18   Temp: (!) 97.1 F (36.2 C) 98.3 F (36.8 C) 97.6 F (36.4 C) 98.3 F (36.8 C)  TempSrc: Axillary Axillary  Oral  SpO2: 100% 100% 98% 92%  Weight:        Intake/Output Summary (Last 24 hours) at 08/04/2022 0759 Last data filed at 08/04/2022 0304 Gross per 24 hour  Intake 720 ml  Output 400 ml  Net 320 ml   Filed Weights   07/04/22 2230 07/06/22 1533  Weight: 84 kg 82 kg     Data Reviewed:   CBC: Recent Labs  Lab 08/02/22  0914  WBC 6.3  HGB 10.2*  HCT 31.8*  MCV 93.5  PLT 123XX123*   Basic Metabolic Panel: Recent Labs  Lab 08/02/22 0914  NA 140  K 4.3  CL 110  CO2 26  GLUCOSE 98  BUN 19  CREATININE 0.81  CALCIUM 8.8*  MG 2.4   GFR: Estimated Creatinine Clearance: 81.6 mL/min (by C-G formula based on SCr of 0.81 mg/dL). Liver Function  Tests: No results for input(s): "AST", "ALT", "ALKPHOS", "BILITOT", "PROT", "ALBUMIN" in the last 168 hours. No results for input(s): "LIPASE", "AMYLASE" in the last 168 hours. No results for input(s): "AMMONIA" in the last 168 hours. Coagulation Profile: No results for input(s): "INR", "PROTIME" in the last 168 hours. Cardiac Enzymes: No results for input(s): "CKTOTAL", "CKMB", "CKMBINDEX", "TROPONINI" in the last 168 hours. BNP (last 3 results) No results for input(s): "PROBNP" in the last 8760 hours. HbA1C: No results for input(s): "HGBA1C" in the last 72 hours. CBG: No results for input(s): "GLUCAP" in the last 168 hours. Lipid Profile: No results for input(s): "CHOL", "HDL", "LDLCALC", "TRIG", "CHOLHDL", "LDLDIRECT" in the last 72 hours. Thyroid Function Tests: No results for input(s): "TSH", "T4TOTAL", "FREET4", "T3FREE", "THYROIDAB" in the last 72 hours. Anemia Panel: No results for input(s): "VITAMINB12", "FOLATE", "FERRITIN", "TIBC", "IRON", "RETICCTPCT" in the last 72 hours. Sepsis Labs: No results for input(s): "PROCALCITON", "LATICACIDVEN" in the last 168 hours.  No results found for this or any previous visit (from the past 240 hour(s)).       Radiology Studies: No results found.      Scheduled Meds:  amLODipine  5 mg Oral Daily   atorvastatin  20 mg Oral QHS   divalproex  500 mg Oral BID   feeding supplement  237 mL Oral TID BM   megestrol  400 mg Oral Daily   melatonin  10 mg Oral QHS   mirtazapine  15 mg Oral QHS   multivitamin with minerals  1 tablet Oral Daily   polyethylene glycol  17 g Oral BID   QUEtiapine  100 mg Oral QHS   risperiDONE  1 mg Oral BID   senna-docusate  2 tablet Oral BID   tamsulosin  0.4 mg Oral QPC supper   Continuous Infusions:   LOS: 25 days   Time spent= 35 mins    Ameilia Rattan Arsenio Loader, MD [facility]  If 7PM-7AM, please contact night-coverage  08/04/2022, 7:59 AM

## 2022-08-04 NOTE — Progress Notes (Signed)
Two call attempts made  to contact [facility] to confirm assessment was completed yesterday and if patient would be accepted back to facility. No answer.    9:17am Spoke with Zigmund Daniel at [facility]. Assessment was completed. Bed offer declined. Per Zigmund Daniel patient was determined to need skilled care. Email update sent to Toula Moos at [facility]

## 2022-08-04 NOTE — Progress Notes (Signed)
Mobility Specialist - Progress Note   08/04/22 1600  Mobility  Activity Ambulated with assistance in hallway  Level of Assistance Minimal assist, patient does 75% or more  Assistive Device Front wheel walker  Distance Ambulated (ft) 30 ft  Activity Response Tolerated well  $Mobility charge 1 Mobility     Pt ambulated hallway with min-modA for facilitating RW and turns. MaxA completing bed mobility and STS. VC for anterior weight shifting to stand. Pt returned to bed with alarm set, needs in reach.    Kathee Delton Mobility Specialist 08/04/22, 4:15 PM

## 2022-08-05 DIAGNOSIS — F03918 Unspecified dementia, unspecified severity, with other behavioral disturbance: Secondary | ICD-10-CM | POA: Diagnosis not present

## 2022-08-05 LAB — BASIC METABOLIC PANEL
Anion gap: 6 (ref 5–15)
BUN: 27 mg/dL — ABNORMAL HIGH (ref 8–23)
CO2: 24 mmol/L (ref 22–32)
Calcium: 8.6 mg/dL — ABNORMAL LOW (ref 8.9–10.3)
Chloride: 108 mmol/L (ref 98–111)
Creatinine, Ser: 0.77 mg/dL (ref 0.61–1.24)
GFR, Estimated: >60 mL/min (ref >60)
Glucose, Bld: 102 mg/dL — ABNORMAL HIGH (ref 70–99)
Potassium: 4.4 mmol/L (ref 3.5–5.1)
Sodium: 138 mmol/L (ref 135–145)

## 2022-08-05 LAB — CBC
HCT: 32.7 % — ABNORMAL LOW (ref 39.0–52.0)
Hemoglobin: 10.4 g/dL — ABNORMAL LOW (ref 13.0–17.0)
MCH: 30.1 pg (ref 26.0–34.0)
MCHC: 31.8 g/dL (ref 30.0–36.0)
MCV: 94.8 fL (ref 80.0–100.0)
Platelets: 367 10*3/uL (ref 150–400)
RBC: 3.45 MIL/uL — ABNORMAL LOW (ref 4.22–5.81)
RDW: 14 % (ref 11.5–15.5)
WBC: 6.7 10*3/uL (ref 4.0–10.5)
nRBC: 0 % (ref 0.0–0.2)

## 2022-08-05 LAB — MAGNESIUM: Magnesium: 2.3 mg/dL (ref 1.7–2.4)

## 2022-08-05 NOTE — Progress Notes (Addendum)
PROGRESS NOTE    Larry Buckley  WUJ:[id number] DOB: [birth year] DOA: 06/17/2022 PCP: Venita Lick, NP   Brief Narrative:  81 y.o. male with medical history significant for dementia with behavioral disturbance, hypertension, hyperlipidemia, AAA with repair, gout, who initially presented to [facility] ED after aggressive and combative behavior at the group home.  He was brought into the ED on 06/19/2022 via EMS under IVC.  He has been staying in the ED, seen by psychiatry and social worker, awaiting placement.  Admitted to hospitalist service on 9/19 as patient has increased weakness. Patient has significant agitation, barely eating.  Perative care consult obtained, currently working with DSS for goal of care.  Off-and-on patient has required Geodon.  Psychiatry adjusted Risperdal and Seroquel.  Now overall remained stable awaiting placement.  Hospital course also complicated by urinary retention and Foley catheter placement by urology but eventually patient pulled it out and Flomax was added.  TOC team working on placement   Assessment & Plan:  Principal Problem:   Dementia with behavioral disturbance ([place]) Active Problems:   Essential hypertension   Hematuria   Protein-calorie malnutrition, severe   Failure to thrive in adult   Anorexia   Central sleep apnea   Dementia with behavioral disturbance ([place]) Appreciate psych input.  Continue Depakote, Remeron, Seroquel, Risperdal and as needed Haldol.  Psychiatry increased the dose of Risperdal and Seroquel on 9/29   Intermittent irritability and sundowning   Essential hypertension On Norvasc.  IV as needed   Hematuria, resolved -Initial issues with retention requiring Foley catheter placement but due to blockage urology was consulted who placed Foley catheter was left in for a week and eventually removed.  Doing well with Flomax   Protein-calorie malnutrition, severe Encourage oral intake   Failure to thrive in adult Complicates  overall prognosis.  Palliative care and TOC team is working with DSS for placement and goals of care   Central sleep apnea Monitor   Hyperlipidemia - Statin  Dysphagia 3 diet   DVT prophylaxis: Place and maintain sequential compression device Start: 07/16/22 2211  Code Status: Full code Family Communication:    Status is: Inpatient TOC working on placement Nutritional status    Signs/Symptoms: severe fat depletion, severe muscle depletion, percent weight loss Percent weight loss: 8 %     Body mass index is 23.21 kg/m.     Subjective:  Doing okay no complaints. Laying in the bed no complaints.  Spoke with his RN as well. No active issues at this time.  Examination: Constitutional: Not in acute distress, cachectic frail Respiratory: Clear to auscultation bilaterally Cardiovascular: Normal sinus rhythm, no rubs Abdomen: Nontender nondistended good bowel sounds Musculoskeletal: No edema noted Skin: No rashes seen Neurologic: CN 2-12 grossly intact.  And nonfocal Psychiatric: Very flat affect Objective: Vitals:   08/04/22 0816 08/04/22 1618 08/04/22 1953 08/05/22 0450  BP: (!) 142/70 109/61 136/62 (!) 121/59  Pulse: 90 78 70 66  Resp: 18 14 18    Temp: 98 F (36.7 C) 98.4 F (36.9 C) 99.1 F (37.3 C) 98.2 F (36.8 C)  TempSrc: Oral Oral Oral Oral  SpO2: 92% 100% 100% 100%  Weight:        Intake/Output Summary (Last 24 hours) at 08/05/2022 0931 Last data filed at 08/05/2022 0400 Gross per 24 hour  Intake --  Output 350 ml  Net -350 ml   Filed Weights   07/04/22 2230 07/06/22 1533  Weight: 84 kg 82 kg     Data  Reviewed:   CBC: Recent Labs  Lab 08/02/22 0914 08/05/22 0818  WBC 6.3 6.7  HGB 10.2* 10.4*  HCT 31.8* 32.7*  MCV 93.5 94.8  PLT 413* 367   Basic Metabolic Panel: Recent Labs  Lab 08/02/22 0914 08/05/22 0818  NA 140 138  K 4.3 4.4  CL 110 108  CO2 26 24  GLUCOSE 98 102*  BUN 19 27*  CREATININE 0.81 0.77  CALCIUM 8.8*  8.6*  MG 2.4 2.3   GFR: Estimated Creatinine Clearance: 82.6 mL/min (by C-G formula based on SCr of 0.77 mg/dL). Liver Function Tests: No results for input(s): "AST", "ALT", "ALKPHOS", "BILITOT", "PROT", "ALBUMIN" in the last 168 hours. No results for input(s): "LIPASE", "AMYLASE" in the last 168 hours. No results for input(s): "AMMONIA" in the last 168 hours. Coagulation Profile: No results for input(s): "INR", "PROTIME" in the last 168 hours. Cardiac Enzymes: No results for input(s): "CKTOTAL", "CKMB", "CKMBINDEX", "TROPONINI" in the last 168 hours. BNP (last 3 results) No results for input(s): "PROBNP" in the last 8760 hours. HbA1C: No results for input(s): "HGBA1C" in the last 72 hours. CBG: No results for input(s): "GLUCAP" in the last 168 hours. Lipid Profile: No results for input(s): "CHOL", "HDL", "LDLCALC", "TRIG", "CHOLHDL", "LDLDIRECT" in the last 72 hours. Thyroid Function Tests: No results for input(s): "TSH", "T4TOTAL", "FREET4", "T3FREE", "THYROIDAB" in the last 72 hours. Anemia Panel: No results for input(s): "VITAMINB12", "FOLATE", "FERRITIN", "TIBC", "IRON", "RETICCTPCT" in the last 72 hours. Sepsis Labs: No results for input(s): "PROCALCITON", "LATICACIDVEN" in the last 168 hours.  No results found for this or any previous visit (from the past 240 hour(s)).       Radiology Studies: No results found.      Scheduled Meds:  amLODipine  5 mg Oral Daily   atorvastatin  20 mg Oral QHS   divalproex  500 mg Oral BID   feeding supplement  237 mL Oral TID BM   megestrol  400 mg Oral Daily   melatonin  10 mg Oral QHS   mirtazapine  15 mg Oral QHS   multivitamin with minerals  1 tablet Oral Daily   polyethylene glycol  17 g Oral BID   QUEtiapine  100 mg Oral QHS   risperiDONE  1 mg Oral BID   senna-docusate  2 tablet Oral BID   tamsulosin  0.4 mg Oral QPC supper   Continuous Infusions:   LOS: 26 days   Time spent= 35 mins    Johntay Doolen Joline Maxcy,  MD [facility]  If 7PM-7AM, please contact night-coverage  08/05/2022, 9:31 AM

## 2022-08-06 DIAGNOSIS — F03918 Unspecified dementia, unspecified severity, with other behavioral disturbance: Secondary | ICD-10-CM | POA: Diagnosis not present

## 2022-08-06 NOTE — Plan of Care (Signed)

## 2022-08-06 NOTE — Progress Notes (Signed)
PROGRESS NOTE    Larry Buckley  UUV:[id number]  DOB: Feb 17, 1941  DOA: Jul 10, 2022 PCP: Marjie Skiff, NP Outpatient Specialists:   Hospital course:  81 y.o. male with medical history significant for dementia with behavioral disturbance, hypertension, hyperlipidemia, AAA with repair, gout, who initially presented to [facility] ED after aggressive and combative behavior at the group home.  He was brought into the ED on July 10, 2022 via EMS under IVC.  He has been staying in the ED, seen by psychiatry and social worker, awaiting placement.  Admitted to hospitalist service on 9/19 as patient has increased weakness.  Patient has significant agitation, barely eating.  Perative care consult obtained, currently working with DSS for goal of care.  Off-and-on patient has required Geodon.  Psychiatry adjusted Risperdal and Seroquel.  Now overall remained stable awaiting placement.  Hospital course also complicated by urinary retention and Foley catheter placement by urology but eventually patient pulled it out and Flomax was added.  TOC team working on placement  Subjective:  Patient was sleeping, sitter at bedside.  Sitter states that he has been somewhat agitated when awake but no change from baseline.   Objective: Vitals:   08/05/22 2036 08/06/22 0500 08/06/22 0921 08/06/22 1303  BP: (!) 164/87 (!) 166/87 (!) 145/73 134/66  Pulse:  81 73 63  Resp: 18 18 18 17   Temp: 98.5 F (36.9 C) 97.9 F (36.6 C) 98.1 F (36.7 C) 98.7 F (37.1 C)  TempSrc: Oral Oral Oral   SpO2: 96% 97% 100% 100%  Weight:        Intake/Output Summary (Last 24 hours) at 08/06/2022 1656 Last data filed at 08/05/2022 1924 Gross per 24 hour  Intake 360 ml  Output --  Net 360 ml   Filed Weights   07/04/22 2230 07/06/22 1533  Weight: 84 kg 82 kg     Exam:  General: Patient sleeping comfortably on his left side in NAD CVS: S1-S2, regular  Respiratory:  decreased air entry bilaterally secondary to decreased  inspiratory effort, rales at bases  GI: NABS, soft, NT  LE: No edema.   Assessment & Plan:   81 year old with dementia and behavioral disturbance is awaiting placement at SNF however has been difficult to place.  Patient has intermittent irritability and sundowning.  No change in management as previously instituted.   Per previous note: Dementia with behavioral disturbance [facility]) Appreciate psych input.  Continue Depakote, Remeron, Seroquel, Risperdal and as needed Haldol.  Psychiatry increased the dose of Risperdal and Seroquel on 9/29   Intermittent irritability and sundowning   Essential hypertension On Norvasc.  IV as needed   Hematuria, resolved -Initial issues with retention requiring Foley catheter placement but due to blockage urology was consulted who placed Foley catheter was left in for a week and eventually removed.  Doing well with Flomax   Protein-calorie malnutrition, severe Encourage oral intake   Failure to thrive in adult Complicates overall prognosis.  Palliative care and TOC team is working with DSS for placement and goals of care   Central sleep apnea Monitor   Hyperlipidemia - Statin   Dysphagia 3 diet     Scheduled Meds:  amLODipine  5 mg Oral Daily   atorvastatin  20 mg Oral QHS   divalproex  500 mg Oral BID   feeding supplement  237 mL Oral TID BM   megestrol  400 mg Oral Daily   melatonin  10 mg Oral QHS   mirtazapine  15 mg Oral QHS  multivitamin with minerals  1 tablet Oral Daily   polyethylene glycol  17 g Oral BID   QUEtiapine  100 mg Oral QHS   risperiDONE  1 mg Oral BID   senna-docusate  2 tablet Oral BID   tamsulosin  0.4 mg Oral QPC supper   Continuous Infusions:  Data Reviewed:  Basic Metabolic Panel: Recent Labs  Lab 08/02/22 0914 08/05/22 0818  NA 140 138  K 4.3 4.4  CL 110 108  CO2 26 24  GLUCOSE 98 102*  BUN 19 27*  CREATININE 0.81 0.77  CALCIUM 8.8* 8.6*  MG 2.4 2.3    CBC: Recent Labs  Lab  08/02/22 0914 08/05/22 0818  WBC 6.3 6.7  HGB 10.2* 10.4*  HCT 31.8* 32.7*  MCV 93.5 94.8  PLT 413* 367    Studies: No results found.  Principal Problem:   Dementia with behavioral disturbance ([place]) Active Problems:   Essential hypertension   Hematuria   Protein-calorie malnutrition, severe   Failure to thrive in adult   Anorexia   Central sleep apnea     Breianna Delfino Larry Buckley, [facility]  If 7PM-7AM, please contact night-coverage [email or web address]   LOS: 27 days

## 2022-08-07 DIAGNOSIS — F03918 Unspecified dementia, unspecified severity, with other behavioral disturbance: Secondary | ICD-10-CM | POA: Diagnosis not present

## 2022-08-07 NOTE — Plan of Care (Signed)
  Problem: Education: Goal: Knowledge of General Education information will improve Description: Including pain rating scale, medication(s)/side effects and non-pharmacologic comfort measures 08/07/2022 2022 by Samuella Cota, RN Outcome: Progressing 08/07/2022 2002 by Samuella Cota, RN Outcome: Progressing   Problem: Health Behavior/Discharge Planning: Goal: Ability to manage health-related needs will improve 08/07/2022 2022 by Samuella Cota, RN Outcome: Progressing 08/07/2022 2002 by Samuella Cota, RN Outcome: Progressing   Problem: Clinical Measurements: Goal: Ability to maintain clinical measurements within normal limits will improve 08/07/2022 2022 by Samuella Cota, RN Outcome: Progressing 08/07/2022 2002 by Samuella Cota, RN Outcome: Progressing Goal: Will remain free from infection 08/07/2022 2022 by Samuella Cota, RN Outcome: Progressing 08/07/2022 2002 by Samuella Cota, RN Outcome: Progressing Goal: Diagnostic test results will improve 08/07/2022 2022 by Samuella Cota, RN Outcome: Progressing 08/07/2022 2002 by Samuella Cota, RN Outcome: Progressing Goal: Respiratory complications will improve 08/07/2022 2022 by Samuella Cota, RN Outcome: Progressing 08/07/2022 2002 by Samuella Cota, RN Outcome: Progressing Goal: Cardiovascular complication will be avoided 08/07/2022 2022 by Samuella Cota, RN Outcome: Progressing 08/07/2022 2002 by Samuella Cota, RN Outcome: Progressing   Problem: Activity: Goal: Risk for activity intolerance will decrease 08/07/2022 2022 by Samuella Cota, RN Outcome: Progressing 08/07/2022 2002 by Samuella Cota, RN Outcome: Progressing   Problem: Nutrition: Goal: Adequate nutrition will be maintained 08/07/2022 2022 by Samuella Cota, RN Outcome: Progressing 08/07/2022 2002 by Samuella Cota, RN Outcome: Progressing   Problem: Coping: Goal: Level  of anxiety will decrease 08/07/2022 2022 by Samuella Cota, RN Outcome: Progressing 08/07/2022 2002 by Samuella Cota, RN Outcome: Progressing   Problem: Elimination: Goal: Will not experience complications related to bowel motility 08/07/2022 2022 by Samuella Cota, RN Outcome: Progressing 08/07/2022 2002 by Samuella Cota, RN Outcome: Progressing Goal: Will not experience complications related to urinary retention 08/07/2022 2022 by Samuella Cota, RN Outcome: Progressing 08/07/2022 2002 by Samuella Cota, RN Outcome: Progressing   Problem: Pain Managment: Goal: General experience of comfort will improve 08/07/2022 2022 by Samuella Cota, RN Outcome: Progressing 08/07/2022 2002 by Samuella Cota, RN Outcome: Progressing   Problem: Safety: Goal: Ability to remain free from injury will improve 08/07/2022 2022 by Samuella Cota, RN Outcome: Progressing 08/07/2022 2002 by Samuella Cota, RN Outcome: Progressing   Problem: Skin Integrity: Goal: Risk for impaired skin integrity will decrease 08/07/2022 2022 by Samuella Cota, RN Outcome: Progressing 08/07/2022 2002 by Samuella Cota, RN Outcome: Progressing

## 2022-08-07 NOTE — Plan of Care (Signed)

## 2022-08-07 NOTE — Progress Notes (Signed)
PROGRESS NOTE    Larry Buckley  IWP:[id number] DOB: 07-Oct-1941 DOA: 07/15/2022 PCP: Venita Lick, NP   Brief Narrative:  81 y.o. male with medical history significant for dementia with behavioral disturbance, hypertension, hyperlipidemia, AAA with repair, gout, who initially presented to [facility] ED after aggressive and combative behavior at the group home.  He was brought into the ED on 06/19/2022 via EMS under IVC.  He has been staying in the ED, seen by psychiatry and social worker, awaiting placement.  Admitted to hospitalist service on 9/19 as patient has increased weakness. Patient has significant agitation, barely eating.  Perative care consult obtained, currently working with DSS for goal of care.  Off-and-on patient has required Geodon.  Psychiatry adjusted Risperdal and Seroquel.  Now overall remained stable awaiting placement.  Hospital course also complicated by urinary retention and Foley catheter placement by urology but eventually patient pulled it out and Flomax was added.  TOC team working on placement   Assessment & Plan:  Principal Problem:   Dementia with behavioral disturbance ([place]) Active Problems:   Essential hypertension   Hematuria   Protein-calorie malnutrition, severe   Failure to thrive in adult   Anorexia   Central sleep apnea   Dementia with behavioral disturbance ([place]) Appreciate psych input.  Continue Depakote, Remeron, Seroquel, Risperdal and as needed Haldol.  Psychiatry increased the dose of Risperdal and Seroquel on 9/29   Intermittent irritability and sundowning but otherwise ok.    Essential hypertension On Norvasc.  IV as needed   Hematuria, resolved -Initial issues with retention requiring Foley catheter placement but due to blockage urology was consulted who placed Foley catheter was left in for a week and eventually removed.  Doing well with Flomax   Protein-calorie malnutrition, severe Encourage oral intake   Failure to thrive in  adult Complicates overall prognosis.  Palliative care and TOC team is working with DSS for placement and goals of care   Central sleep apnea Monitor   Hyperlipidemia - Statin  Dysphagia 3 diet   DVT prophylaxis: Place and maintain sequential compression device Start: 07/16/22 2211  Code Status: Full code Family Communication:    Status is: Inpatient TOC working on placement Nutritional status    Signs/Symptoms: severe fat depletion, severe muscle depletion, percent weight loss Percent weight loss: 8 %     Body mass index is 23.21 kg/m.     Subjective: Laying in the bed no complaints Follow basic commands  Examination: Constitutional: Not in acute distress, elderly frail Respiratory: Clear to auscultation bilaterally Cardiovascular: Normal sinus rhythm, no rubs Abdomen: Nontender nondistended good bowel sounds Musculoskeletal: No edema noted Skin: No rashes seen Neurologic: CN 2-12 grossly intact.  And nonfocal Psychiatric: Affect.  Poor judgment and insight Objective: Vitals:   08/06/22 1303 08/06/22 1658 08/06/22 2021 08/07/22 0415  BP: 134/66 (!) 145/71 (!) 109/57 (!) 151/89  Pulse: 63 90 66 76  Resp: 17 19 20 16   Temp: 98.7 F (37.1 C) 98.5 F (36.9 C) 99.3 F (37.4 C) (!) 97.5 F (36.4 C)  TempSrc:  Oral Oral Oral  SpO2: 100% 90% 95% 100%  Weight:        Intake/Output Summary (Last 24 hours) at 08/07/2022 0857 Last data filed at 08/06/2022 1907 Gross per 24 hour  Intake 100 ml  Output 550 ml  Net -450 ml   Filed Weights   07/04/22 2230 07/06/22 1533  Weight: 84 kg 82 kg     Data Reviewed:   CBC: Recent  Labs  Lab 08/02/22 0914 08/05/22 0818  WBC 6.3 6.7  HGB 10.2* 10.4*  HCT 31.8* 32.7*  MCV 93.5 94.8  PLT 413* 367   Basic Metabolic Panel: Recent Labs  Lab 08/02/22 0914 08/05/22 0818  NA 140 138  K 4.3 4.4  CL 110 108  CO2 26 24  GLUCOSE 98 102*  BUN 19 27*  CREATININE 0.81 0.77  CALCIUM 8.8* 8.6*  MG 2.4 2.3    GFR: Estimated Creatinine Clearance: 82.6 mL/min (by C-G formula based on SCr of 0.77 mg/dL). Liver Function Tests: No results for input(s): "AST", "ALT", "ALKPHOS", "BILITOT", "PROT", "ALBUMIN" in the last 168 hours. No results for input(s): "LIPASE", "AMYLASE" in the last 168 hours. No results for input(s): "AMMONIA" in the last 168 hours. Coagulation Profile: No results for input(s): "INR", "PROTIME" in the last 168 hours. Cardiac Enzymes: No results for input(s): "CKTOTAL", "CKMB", "CKMBINDEX", "TROPONINI" in the last 168 hours. BNP (last 3 results) No results for input(s): "PROBNP" in the last 8760 hours. HbA1C: No results for input(s): "HGBA1C" in the last 72 hours. CBG: No results for input(s): "GLUCAP" in the last 168 hours. Lipid Profile: No results for input(s): "CHOL", "HDL", "LDLCALC", "TRIG", "CHOLHDL", "LDLDIRECT" in the last 72 hours. Thyroid Function Tests: No results for input(s): "TSH", "T4TOTAL", "FREET4", "T3FREE", "THYROIDAB" in the last 72 hours. Anemia Panel: No results for input(s): "VITAMINB12", "FOLATE", "FERRITIN", "TIBC", "IRON", "RETICCTPCT" in the last 72 hours. Sepsis Labs: No results for input(s): "PROCALCITON", "LATICACIDVEN" in the last 168 hours.  No results found for this or any previous visit (from the past 240 hour(s)).       Radiology Studies: No results found.      Scheduled Meds:  amLODipine  5 mg Oral Daily   atorvastatin  20 mg Oral QHS   divalproex  500 mg Oral BID   feeding supplement  237 mL Oral TID BM   megestrol  400 mg Oral Daily   melatonin  10 mg Oral QHS   mirtazapine  15 mg Oral QHS   multivitamin with minerals  1 tablet Oral Daily   polyethylene glycol  17 g Oral BID   QUEtiapine  100 mg Oral QHS   risperiDONE  1 mg Oral BID   senna-docusate  2 tablet Oral BID   tamsulosin  0.4 mg Oral QPC supper   Continuous Infusions:   LOS: 28 days   Time spent= 35 mins    Damain Broadus Joline Maxcy, MD [facility]  If 7PM-7AM, please contact night-coverage  08/07/2022, 8:58 AM

## 2022-08-08 DIAGNOSIS — F03918 Unspecified dementia, unspecified severity, with other behavioral disturbance: Secondary | ICD-10-CM | POA: Diagnosis not present

## 2022-08-08 LAB — CBC
HCT: 33.3 % — ABNORMAL LOW (ref 39.0–52.0)
Hemoglobin: 10.9 g/dL — ABNORMAL LOW (ref 13.0–17.0)
MCH: 30.6 pg (ref 26.0–34.0)
MCHC: 32.7 g/dL (ref 30.0–36.0)
MCV: 93.5 fL (ref 80.0–100.0)
Platelets: 330 10*3/uL (ref 150–400)
RBC: 3.56 MIL/uL — ABNORMAL LOW (ref 4.22–5.81)
RDW: 13.8 % (ref 11.5–15.5)
WBC: 5.6 10*3/uL (ref 4.0–10.5)
nRBC: 0 % (ref 0.0–0.2)

## 2022-08-08 LAB — BASIC METABOLIC PANEL
Anion gap: 5 (ref 5–15)
BUN: 24 mg/dL — ABNORMAL HIGH (ref 8–23)
CO2: 26 mmol/L (ref 22–32)
Calcium: 8.7 mg/dL — ABNORMAL LOW (ref 8.9–10.3)
Chloride: 108 mmol/L (ref 98–111)
Creatinine, Ser: 0.84 mg/dL (ref 0.61–1.24)
GFR, Estimated: >60 mL/min (ref >60)
Glucose, Bld: 100 mg/dL — ABNORMAL HIGH (ref 70–99)
Potassium: 4.2 mmol/L (ref 3.5–5.1)
Sodium: 139 mmol/L (ref 135–145)

## 2022-08-08 LAB — MAGNESIUM: Magnesium: 2.4 mg/dL (ref 1.7–2.4)

## 2022-08-08 NOTE — NC FL2 (Signed)
[place] LEVEL OF CARE SCREENING TOOL     IDENTIFICATION  Patient Name: Larry Buckley Birthdate: 1941-01-31 Sex: male Admission Date (Current Location): 06/18/2022  [facility] and [place] Number:  Engineering geologist and Address:  [facility], [address], [place], [place] [address]      Provider Number: [id number]  Attending Physician Name and Address:  Damita Lack, MD  Relative Name and Phone Number:  Toula Moos - [phone]    Current Level of Care: Hospital Recommended Level of Care: [place] Prior Approval Number:    Date Approved/Denied:   PASRR Number: [id number]  Discharge Plan: SNF    Current Diagnoses: Patient Active Problem List   Diagnosis Date Noted   Hematuria 07/16/2022   Central sleep apnea 07/11/2022   Acute urinary retention 07/11/2022   Protein-calorie malnutrition, severe 07/07/2022   Anorexia 07/05/2022   Failure to thrive in adult 07/04/2022   Acute pain of right shoulder 06/08/2022   Acute hip pain, left 06/08/2022   Low hemoglobin 04/26/2022   Edema of both legs 01/27/2022   Prediabetes 01/04/2022   Dementia with behavioral disturbance ([place]) 12/29/2021   History of abdominal aortic aneurysm (AAA) repair 09/25/2021   B12 deficiency 07/11/2021   Hyperlipidemia, mixed    Essential hypertension    Gout, arthropathy     Orientation RESPIRATION BLADDER Height & Weight     Self  Normal Incontinent, External catheter Weight: 180 lb 12.4 oz (82 kg) Height:     BEHAVIORAL SYMPTOMS/MOOD NEUROLOGICAL BOWEL NUTRITION STATUS  Other (Comment) (Calm, cooperative)  (Dementia) Incontinent Diet (DYS 3)  AMBULATORY STATUS COMMUNICATION OF NEEDS Skin   Limited Assist Verbally Normal                       Personal Care Assistance Level of Assistance  Bathing, Feeding, Dressing Bathing Assistance: Maximum assistance Feeding assistance: Limited assistance Dressing Assistance:  Maximum assistance     Functional Limitations Info  Sight, Hearing, Speech Sight Info: Adequate Hearing Info: Adequate Speech Info: Adequate    SPECIAL CARE FACTORS FREQUENCY                   Contractures Contractures Info: Not present    Additional Factors Info  Code Status, Allergies Code Status Info: Full code Allergies Info: Lotensin (Benazepril Hcl)           Current Medications (08/08/2022):  This is the current hospital active medication list Current Facility-Administered Medications  Medication Dose Route Frequency Provider Last Rate Last Admin   acetaminophen (TYLENOL) tablet 650 mg  650 mg Oral Q6H PRN Kayleen Memos, DO   650 mg at 07/16/22 2108   amLODipine (NORVASC) tablet 5 mg  5 mg Oral Daily Waldon Merl F, NP   5 mg at 08/08/22 1009   atorvastatin (LIPITOR) tablet 20 mg  20 mg Oral QHS Waldon Merl F, NP   20 mg at 08/07/22 2017   divalproex (DEPAKOTE) DR tablet 500 mg  500 mg Oral BID Waldon Merl F, NP   500 mg at 08/08/22 1009   feeding supplement (ENSURE ENLIVE / ENSURE PLUS) liquid 237 mL  237 mL Oral TID BM Sharen Hones, MD   237 mL at 08/08/22 1010   guaiFENesin (ROBITUSSIN) 100 MG/5ML liquid 5 mL  5 mL Oral Q4H PRN Amin, Ankit Chirag, MD       haloperidol (HALDOL) tablet 2 mg  2 mg Oral Q6H  PRN Marrion Coy, MD   2 mg at 08/06/22 5929   Or   haloperidol lactate (HALDOL) injection 2 mg  2 mg Intramuscular Q6H PRN Marrion Coy, MD   2 mg at 07/30/22 1719   hydrALAZINE (APRESOLINE) injection 10 mg  10 mg Intravenous Q4H PRN Amin, Ankit Chirag, MD       ipratropium-albuterol (DUONEB) 0.5-2.5 (3) MG/3ML nebulizer solution 3 mL  3 mL Nebulization Q4H PRN Amin, Ankit Chirag, MD       lactulose (CHRONULAC) 10 GM/15ML solution 20 g  20 g Oral BID PRN Marrion Coy, MD       melatonin tablet 10 mg  10 mg Oral QHS Gabriel Cirri F, NP   10 mg at 08/07/22 2016   metoprolol tartrate (LOPRESSOR) injection 5 mg  5 mg Intravenous Q4H PRN Amin,  Ankit Chirag, MD       mirtazapine (REMERON) tablet 15 mg  15 mg Oral QHS Gabriel Cirri F, NP   15 mg at 08/07/22 2015   multivitamin with minerals tablet 1 tablet  1 tablet Oral Daily Marrion Coy, MD   1 tablet at 08/08/22 1009   ondansetron (ZOFRAN) injection 4 mg  4 mg Intravenous Q6H PRN Darlin Drop, DO       Oral care mouth rinse  15 mL Mouth Rinse PRN Marrion Coy, MD       polyethylene glycol (MIRALAX / GLYCOLAX) packet 17 g  17 g Oral BID Marrion Coy, MD   17 g at 08/08/22 1011   QUEtiapine (SEROQUEL) tablet 100 mg  100 mg Oral QHS Clapacs, John T, MD   100 mg at 08/07/22 2014   risperiDONE (RISPERDAL M-TABS) disintegrating tablet 1 mg  1 mg Oral BID Clapacs, John T, MD   1 mg at 08/08/22 1010   senna-docusate (Senokot-S) tablet 1 tablet  1 tablet Oral QHS PRN Amin, Ankit Chirag, MD       senna-docusate (Senokot-S) tablet 2 tablet  2 tablet Oral BID Marrion Coy, MD   2 tablet at 08/08/22 1009   tamsulosin (FLOMAX) capsule 0.4 mg  0.4 mg Oral QPC supper Arnetha Courser, MD   0.4 mg at 08/07/22 1800   traZODone (DESYREL) tablet 50 mg  50 mg Oral QHS PRN Dimple Nanas, MD   50 mg at 08/06/22 2100     Discharge Medications: Please see discharge summary for a list of discharge medications.  Relevant Imaging Results:  Relevant Lab Results:   Additional Information SS#: [id number]  Margarito Liner, LCSW

## 2022-08-08 NOTE — Progress Notes (Signed)
Nutrition Follow Up Note   DOCUMENTATION CODES:   Severe malnutrition in context of social or environmental circumstances  INTERVENTION:   Ensure Enlive po TID, each supplement provides 350 kcal and 20 grams of protein.  Magic cup TID with meals, each supplement provides 290 kcal and 9 grams of protein  MVI po daily   Dysphagia 3 diet   Assist with meals   NUTRITION DIAGNOSIS:   Severe Malnutrition related to social / environmental circumstances as evidenced by severe fat depletion, severe muscle depletion, 8 percent weight loss in 2 months.  GOAL:   Patient will meet greater than or equal to 90% of their needs -progressing   MONITOR:   PO intake, Supplement acceptance, Labs, Weight trends, Skin, I & O's  ASSESSMENT:   81 y.o. male with medical history significant for dementia with behavioral disturbance, hypertension, hyperlipidemia, AAA with repair and gout who is admitted after aggressive and combative behavior at the group home.  Pt with inconsistent appetite and oral intake; pt eating anywhere from 0-100% of meals but does better with meal assistance. Pt is being offered Ensure and Magic Cups. Pt remains on appetite stimulants. No weight since 9/21; pt is ordered for weekly weights but none documented. Pt is currently pending placement to memory care unit.    Medications reviewed and include: melatonin, remeron, MVI, miralax, senokot  Labs reviewed: BUN 24(H), Mg 2.4 wnl Hgb 10.9(L), Hct 33.3(L)  Diet Order:   Diet Order             DIET DYS 3 Room service appropriate? No; Fluid consistency: Thin  Diet effective now                  EDUCATION NEEDS:   No education needs have been identified at this time  Skin:  Skin Assessment: Reviewed RN Assessment  Last BM:  10/24- type 6  Height:   Ht Readings from Last 1 Encounters:  06/08/22 6\' 2"  (1.88 m)    Weight:   Wt Readings from Last 1 Encounters:  07/06/22 82 kg    Ideal Body Weight:  86.3  kg  BMI:  Body mass index is 23.21 kg/m.  Estimated Nutritional Needs:   Kcal:  2100-2400kcal/day  Protein:  105-120g/day  Fluid:  2.1-2.4L/day  Koleen Distance MS, RD, LDN Please refer to [facility] for RD and/or RD on-call/weekend/after hours pager

## 2022-08-08 NOTE — Progress Notes (Signed)
PROGRESS NOTE    Larry Buckley  HAL:[id number] DOB: December 06, 1940 DOA: 06/24/2022 PCP: Marjie Skiff, NP   Brief Narrative:  81 y.o. male with medical history significant for dementia with behavioral disturbance, hypertension, hyperlipidemia, AAA with repair, gout, who initially presented to [facility] ED after aggressive and combative behavior at the group home.  He was brought into the ED on 07/09/2022 via EMS under IVC.  He has been staying in the ED, seen by psychiatry and social worker, awaiting placement.  Admitted to hospitalist service on 9/19 as patient has increased weakness. Patient has significant agitation, barely eating.  Previously seen by palliative care.  Off-and-on patient has required Geodon.  Psychiatry adjusted Risperdal and Seroquel.  Now overall remained stable awaiting placement.  Hospital course also complicated by urinary retention and Foley catheter placement by urology but eventually patient pulled it out and Flomax was added.  TOC team working on placement   Assessment & Plan:  Principal Problem:   Dementia with behavioral disturbance ([facility]) Active Problems:   Essential hypertension   Hematuria   Protein-calorie malnutrition, severe   Failure to thrive in adult   Anorexia   Central sleep apnea   Dementia with behavioral disturbance ([facility]) Appreciate psych input.  Continue Depakote, Remeron, Seroquel, Risperdal and as needed Haldol.  Psychiatry increased the dose of Risperdal and Seroquel on 9/29   Essential hypertension On Norvasc.  IV as needed   Hematuria, resolved -Initial issues with retention requiring Foley catheter placement but due to blockage urology was consulted who placed Foley catheter was left in for a week and eventually removed.  Continue Flomax   Protein-calorie malnutrition, severe Encourage oral intake   Failure to thrive in adult Complicates overall prognosis.  Seen by palliative care in the past.  When now unable to reach family    Central sleep apnea Monitor   Hyperlipidemia - Statin  Dysphagia 3 diet   DVT prophylaxis: Place and maintain sequential compression device Start: 07/16/22 2211  Code Status: Full code Family Communication: We will do recently [place]  Status is: Inpatient TOC working on placement Nutritional status    Signs/Symptoms: severe fat depletion, severe muscle depletion, percent weight loss Percent weight loss: 8 %     Body mass index is 23.21 kg/m.     Subjective: Sitting up no complaints  Examination: Constitutional: Not in acute distress.  Elderly frail Respiratory: Clear to auscultation bilaterally Cardiovascular: Normal sinus rhythm, no rubs Abdomen: Nontender nondistended good bowel sounds Musculoskeletal: No edema noted Skin: No rashes seen Neurologic: CN 2-12 grossly intact.  And nonfocal Psychiatric: Poor judgment and insight.  Flat affect Objective: Vitals:   08/07/22 2030 08/08/22 0340 08/08/22 0806 08/08/22 0806  BP: 128/63 (!) 149/70 138/74 138/74  Pulse: 80 73 67 67  Resp: 16 17 18 18   Temp: 99 F (37.2 C) 99 F (37.2 C) 98 F (36.7 C) 98 F (36.7 C)  TempSrc:   Oral Oral  SpO2: 100% 100% 100% 100%  Weight:        Intake/Output Summary (Last 24 hours) at 08/08/2022 1050 Last data filed at 08/07/2022 2231 Gross per 24 hour  Intake 120 ml  Output 400 ml  Net -280 ml   Filed Weights   07/04/22 2230 07/06/22 1533  Weight: 84 kg 82 kg     Data Reviewed:   CBC: Recent Labs  Lab 08/02/22 0914 08/05/22 0818 08/08/22 0809  WBC 6.3 6.7 5.6  HGB 10.2* 10.4* 10.9*  HCT 31.8* 32.7*  33.3*  MCV 93.5 94.8 93.5  PLT 413* 367 194   Basic Metabolic Panel: Recent Labs  Lab 08/02/22 0914 08/05/22 0818 08/08/22 0809  NA 140 138 139  K 4.3 4.4 4.2  CL 110 108 108  CO2 26 24 26   GLUCOSE 98 102* 100*  BUN 19 27* 24*  CREATININE 0.81 0.77 0.84  CALCIUM 8.8* 8.6* 8.7*  MG 2.4 2.3 2.4   GFR: Estimated Creatinine Clearance: 78.6 mL/min  (by C-G formula based on SCr of 0.84 mg/dL). Liver Function Tests: No results for input(s): "AST", "ALT", "ALKPHOS", "BILITOT", "PROT", "ALBUMIN" in the last 168 hours. No results for input(s): "LIPASE", "AMYLASE" in the last 168 hours. No results for input(s): "AMMONIA" in the last 168 hours. Coagulation Profile: No results for input(s): "INR", "PROTIME" in the last 168 hours. Cardiac Enzymes: No results for input(s): "CKTOTAL", "CKMB", "CKMBINDEX", "TROPONINI" in the last 168 hours. BNP (last 3 results) No results for input(s): "PROBNP" in the last 8760 hours. HbA1C: No results for input(s): "HGBA1C" in the last 72 hours. CBG: No results for input(s): "GLUCAP" in the last 168 hours. Lipid Profile: No results for input(s): "CHOL", "HDL", "LDLCALC", "TRIG", "CHOLHDL", "LDLDIRECT" in the last 72 hours. Thyroid Function Tests: No results for input(s): "TSH", "T4TOTAL", "FREET4", "T3FREE", "THYROIDAB" in the last 72 hours. Anemia Panel: No results for input(s): "VITAMINB12", "FOLATE", "FERRITIN", "TIBC", "IRON", "RETICCTPCT" in the last 72 hours. Sepsis Labs: No results for input(s): "PROCALCITON", "LATICACIDVEN" in the last 168 hours.  No results found for this or any previous visit (from the past 240 hour(s)).       Radiology Studies: No results found.      Scheduled Meds:  amLODipine  5 mg Oral Daily   atorvastatin  20 mg Oral QHS   divalproex  500 mg Oral BID   feeding supplement  237 mL Oral TID BM   melatonin  10 mg Oral QHS   mirtazapine  15 mg Oral QHS   multivitamin with minerals  1 tablet Oral Daily   polyethylene glycol  17 g Oral BID   QUEtiapine  100 mg Oral QHS   risperiDONE  1 mg Oral BID   senna-docusate  2 tablet Oral BID   tamsulosin  0.4 mg Oral QPC supper   Continuous Infusions:   LOS: 29 days   Time spent= 35 mins    Autym Siess Arsenio Loader, MD [facility]  If 7PM-7AM, please contact night-coverage  08/08/2022, 10:50 AM

## 2022-08-08 NOTE — Plan of Care (Addendum)
Patient alert to self. Problem: Education: Goal: Knowledge of General Education information will improve Description: Including pain rating scale, medication(s)/side effects and non-pharmacologic comfort measures Outcome: Progressing   Problem: Health Behavior/Discharge Planning: Goal: Ability to manage health-related needs will improve Outcome: Progressing   Problem: Clinical Measurements: Goal: Ability to maintain clinical measurements within normal limits will improve Outcome: Progressing Goal: Will remain free from infection Outcome: Progressing Goal: Diagnostic test results will improve Outcome: Progressing Goal: Respiratory complications will improve Outcome: Progressing Goal: Cardiovascular complication will be avoided Outcome: Progressing

## 2022-08-08 NOTE — TOC Progression Note (Signed)
Transition of Care [facility]) - Progression Note    Patient Details  Name: HAITHAM DOLINSKY MRN: [id number] Date of Birth: 03-11-1941  Transition of Care [facility]) CM/SW [place], LCSW Phone Number: 08/08/2022, 1:56 PM  Clinical Narrative:  Looking for SNF to accept patient.   Expected Discharge Plan: Memory Care Barriers to Discharge: Other (must enter comment) (Facility will not accept patient back)  Expected Discharge Plan and Services Expected Discharge Plan: Memory Care   Discharge Planning Services: CM Consult   Living arrangements for the past 2 months: Group Home                 DME Arranged: N/A DME Agency: NA       HH Arranged: NA HH Agency: NA         Social Determinants of Health (SDOH) Interventions    Readmission Risk Interventions     No data to display

## 2022-08-09 ENCOUNTER — Other Ambulatory Visit: Payer: Self-pay

## 2022-08-09 DIAGNOSIS — F03918 Unspecified dementia, unspecified severity, with other behavioral disturbance: Secondary | ICD-10-CM | POA: Diagnosis not present

## 2022-08-09 MED ORDER — ENOXAPARIN SODIUM 40 MG/0.4ML IJ SOSY
40.0000 mg | PREFILLED_SYRINGE | INTRAMUSCULAR | Status: DC
  Administered 2022-08-09 – 2022-10-01 (×53): 40 mg via SUBCUTANEOUS
  Filled 2022-08-09 (×55): qty 0.4

## 2022-08-09 NOTE — TOC Progression Note (Signed)
Transition of Care [facility]) - Progression Note    Patient Details  Name: Larry Buckley MRN: [id number] Date of Birth: 1941-04-14  Transition of [facility]) CM/SW Contact  Beverly Sessions, RN Phone Number: 08/09/2022, 3:15 PM  Clinical Narrative:     No bed offers  Expected Discharge Plan: Memory Care Barriers to Discharge: Other (must enter comment) (Facility will not accept patient back)  Expected Discharge Plan and Services Expected Discharge Plan: Memory Care   Discharge Planning Services: CM Consult   Living arrangements for the past 2 months: Group Home                 DME Arranged: N/A DME Agency: NA       HH Arranged: NA HH Agency: NA         Social Determinants of Health (SDOH) Interventions    Readmission Risk Interventions     No data to display

## 2022-08-09 NOTE — Progress Notes (Signed)
Contacted via MyChart   Good afternoon, have not seen Mr. Enberg since August.  His hip imaging just returned and no fractures seen.  Some mild arthritic changes noted.  Any questions? Keep being stellar!!  Thank you for allowing me to participate in your care.  I appreciate you. Kindest regards, Erby Sanderson

## 2022-08-09 NOTE — Progress Notes (Signed)
  PROGRESS NOTE    Larry Buckley  WCB:[id number] DOB: 05/01/1941 DOA: 07/12/2022 PCP: Venita Lick, NP  224A/224A-AA  LOS: 30 days   Brief hospital course:   Assessment & Plan: Larry Buckley is a 81 y.o. male with medical history significant for dementia with behavioral disturbance, hypertension, hyperlipidemia, AAA with repair, gout, who initially presented to [facility] ED after aggressive and combative behavior at the group home.  He was brought into the ED on 07/15/2022 via EMS under IVC.  He has been staying in the ED, seen by psychiatry and social worker, awaiting placement.  Admitted to hospitalist service on 9/19 as patient has increased weakness. Patient has significant agitation, barely eating.  Previously seen by palliative care.  Off-and-on patient has required Geodon.  Psychiatry adjusted Risperdal and Seroquel.  Now overall remained stable awaiting placement.  Hospital course also complicated by urinary retention and Foley catheter placement by urology but eventually patient pulled it out and Flomax was added.  TOC team working on placement  Dementia with behavioral disturbance ([place]) --psych consulted --Continue Depakote, Remeron, Seroquel, Risperdal and as needed Haldol.     Essential hypertension --cont amlodipine   Hematuria, resolved Urinary retention, resolved -Initial issues with retention requiring Foley catheter placement but due to blockage urology was consulted who placed Foley catheter was left in for a week and eventually removed.   --Continue Flomax   Protein-calorie malnutrition, severe --supplements per dietician   Failure to thrive in adult Complicates overall prognosis.  Seen by palliative care in the past.     Central sleep apnea Monitor   Hyperlipidemia - Statin   DVT prophylaxis: Lovenox SQ Code Status: Full code  Family Communication:  Level of care: Med-Surg Dispo:   The patient is from: group home Anticipated d/c is to: to be  determined Anticipated d/c date is: whenever placement found   Subjective and Interval History:  Good oral intake.  No new issues.   Objective: Vitals:   08/09/22 0308 08/09/22 0643 08/09/22 0800 08/09/22 1543  BP:  124/64 (!) 117/58 126/63  Pulse:  61 61 67  Resp:  16 15 19   Temp:  98.7 F (37.1 C) 98.2 F (36.8 C) 97.8 F (36.6 C)  TempSrc:  Oral Axillary Oral  SpO2:  100% 100% 91%  Weight:      Height: 6\' 2"  (1.88 m)       Intake/Output Summary (Last 24 hours) at 08/09/2022 1608 Last data filed at 08/09/2022 1442 Gross per 24 hour  Intake 480 ml  Output 1200 ml  Net -720 ml   Filed Weights   07/04/22 2230 07/06/22 1533  Weight: 84 kg 82 kg    Examination:   Constitutional: NAD, alert, eating meal HEENT: conjunctivae and lids normal, EOMI CV: No cyanosis.   RESP: normal respiratory effort, on RA Extremities: No effusions, edema in BLE SKIN: warm, dry Neuro: II - XII grossly intact.     Data Reviewed: I have personally reviewed labs and imaging studies  Time spent: 25 minutes  Enzo Bi, MD [facility] If 7PM-7AM, please contact night-coverage 08/09/2022, 4:08 PM

## 2022-08-09 NOTE — Progress Notes (Signed)
Mobility Specialist - Progress Note   08/09/22 1442  Mobility  Activity Turned to right side;Turned to left side;Turned to back - supine  Level of Assistance Moderate assist, patient does 50-74%  Assistive Device None  Activity Response Tolerated well  Mobility Referral Yes  $Mobility charge 1 Mobility   Pt supine upon entry, utilizing RA. +2 was provided for L/R rolling during personally cleaning and bed linen change. Pt left in bed with alarm set, needs in reach.   Candie Mile Mobility Specialist 08/09/22 2:44 PM

## 2022-08-10 DIAGNOSIS — F03918 Unspecified dementia, unspecified severity, with other behavioral disturbance: Secondary | ICD-10-CM | POA: Diagnosis not present

## 2022-08-10 NOTE — Progress Notes (Signed)
[facility] Hospital Liaison note:  This patient is currently enrolled in [facility] outpatient-based Palliative Care. Will continue to follow for disposition.  Please call with any outpatient palliative questions or concerns.  Thank you, Dee Curry, LPN [facility] Hospital Liaison [phone] 

## 2022-08-10 NOTE — Plan of Care (Signed)

## 2022-08-10 NOTE — Progress Notes (Signed)
  PROGRESS NOTE    Larry Buckley  [id number]:[id number] DOB: 1941-08-28 DOA: Jul 10, 2022 PCP: Venita Lick, NP  224A/[id number]-AA  LOS: 31 days   Brief hospital course:   Assessment & Plan: Larry Buckley is a 81 y.o. male with medical history significant for dementia with behavioral disturbance, hypertension, hyperlipidemia, AAA with repair, gout, who initially presented to [facility] ED after aggressive and combative behavior at the group home.  He was brought into the ED on Jul 10, 2022 via EMS under IVC.  He has been staying in the ED, seen by psychiatry and social worker, awaiting placement.  Admitted to hospitalist service on 9/19 as patient has increased weakness. Patient has significant agitation, barely eating.  Previously seen by palliative care.  Off-and-on patient has required Geodon.  Psychiatry adjusted Risperdal and Seroquel.  Now overall remained stable awaiting placement.  Hospital course also complicated by urinary retention and Foley catheter placement by urology but eventually patient pulled it out and Flomax was added.  TOC team working on placement  Dementia with behavioral disturbance ([place]) --psych consulted --Continue Depakote, Remeron, Seroquel, Risperdal and as needed Haldol.     Essential hypertension --cont amlodipine   Hematuria, resolved Urinary retention, resolved -Initial issues with retention requiring Foley catheter placement but due to blockage urology was consulted who placed Foley catheter was left in for a week and eventually removed.   --Continue Flomax   Protein-calorie malnutrition, severe --supplements per dietician   Failure to thrive in adult Complicates overall prognosis.  Seen by palliative care in the past.     Central sleep apnea Monitor   Hyperlipidemia - Statin   DVT prophylaxis: Lovenox SQ Code Status: Full code  Family Communication:  Level of care: Med-Surg Dispo:   The patient is from: group home Anticipated d/c is to: to be  determined Anticipated d/c date is: whenever placement found   Subjective and Interval History:  No change.   Objective: Vitals:   08/09/22 1543 08/09/22 2009 08/10/22 0546 08/10/22 0814  BP: 126/63 (!) 148/67 135/67 (!) 172/76  Pulse: 67 78 70 65  Resp: 19 18 15 18   Temp: 97.8 F (36.6 C) 98.6 F (37 C) 98.4 F (36.9 C) 98.5 F (36.9 C)  TempSrc: Oral Oral Oral Oral  SpO2: 91% 94% 100% 98%  Weight:      Height:        Intake/Output Summary (Last 24 hours) at 08/10/2022 1601 Last data filed at 08/10/2022 1421 Gross per 24 hour  Intake 600 ml  Output --  Net 600 ml   Filed Weights   07/04/22 2230 07/06/22 1533  Weight: 84 kg 82 kg    Examination:   Constitutional: NAD, sleeping but easily arousable, mittens on HEENT: conjunctivae and lids normal, EOMI CV: No cyanosis.   RESP: normal respiratory effort, on RA   Data Reviewed: I have personally reviewed labs and imaging studies  Time spent: 25 minutes  Enzo Bi, MD [facility] If 7PM-7AM, please contact night-coverage 08/10/2022, 4:01 PM

## 2022-08-11 DIAGNOSIS — F03918 Unspecified dementia, unspecified severity, with other behavioral disturbance: Secondary | ICD-10-CM | POA: Diagnosis not present

## 2022-08-11 NOTE — Progress Notes (Signed)
  PROGRESS NOTE    Larry Buckley  [id number]:[id number] DOB: 1941/08/27 DOA: 07/08/2022 PCP: Venita Lick, NP  224A/[id number]-AA  LOS: 32 days   Brief hospital course:   Assessment & Plan: Larry Buckley is a 81 y.o. male with medical history significant for dementia with behavioral disturbance, hypertension, hyperlipidemia, AAA with repair, gout, who initially presented to [facility] ED after aggressive and combative behavior at the group home.  He was brought into the ED on 07/15/2022 via EMS under IVC.  He has been staying in the ED, seen by psychiatry and social worker, awaiting placement.  Admitted to hospitalist service on 9/19 as patient has increased weakness. Patient has significant agitation, barely eating.  Previously seen by palliative care.  Off-and-on patient has required Geodon.  Psychiatry adjusted Risperdal and Seroquel.  Now overall remained stable awaiting placement.  Hospital course also complicated by urinary retention and Foley catheter placement by urology but eventually patient pulled it out and Flomax was added.  TOC team working on placement  Dementia with behavioral disturbance ([place]) --psych consulted --Continue Depakote, Remeron, Seroquel, Risperdal and as needed Haldol.     Essential hypertension --cont amlodipine   Hematuria, resolved Urinary retention, resolved -Initial issues with retention requiring Foley catheter placement but due to blockage urology was consulted who placed Foley catheter was left in for a week and eventually removed.   --Continue Flomax   Protein-calorie malnutrition, severe --supplements per dietician   Failure to thrive in adult Complicates overall prognosis.  Seen by palliative care in the past.     Central sleep apnea Monitor   Hyperlipidemia - Statin   DVT prophylaxis: Lovenox SQ Code Status: Full code  Family Communication:  Level of care: Med-Surg Dispo:   The patient is from: group home Anticipated d/c is to: to be  determined Anticipated d/c date is: whenever placement found   Subjective and Interval History:  No active issue.   Objective: Vitals:   08/11/22 0411 08/11/22 0725 08/11/22 0734 08/11/22 1539  BP: (!) 150/75 (!) 151/64  (!) 143/64  Pulse: (!) 51 76 96 71  Resp: 18 17    Temp: 97.6 F (36.4 C) 98.3 F (36.8 C) 98 F (36.7 C) 98.1 F (36.7 C)  TempSrc:  Oral Oral Oral  SpO2: 90% (!) 85% 100% 100%  Weight:      Height:        Intake/Output Summary (Last 24 hours) at 08/11/2022 1615 Last data filed at 08/11/2022 1340 Gross per 24 hour  Intake 140 ml  Output 1400 ml  Net -1260 ml   Filed Weights   07/04/22 2230 07/06/22 1533  Weight: 84 kg 82 kg    Examination:   Constitutional: NAD, sleeping CV: No cyanosis.   RESP: normal respiratory effort, on RA   Data Reviewed: I have personally reviewed labs and imaging studies  Time spent: 25 minutes  Enzo Bi, MD [facility] If 7PM-7AM, please contact night-coverage 08/11/2022, 4:15 PM

## 2022-08-11 NOTE — TOC Progression Note (Signed)
Transition of Care [facility]) - Progression Note    Patient Details  Name: Larry Buckley MRN: [id number] Date of Birth: 1940-10-30  Transition of Care [facility]) CM/SW [place], LCSW Phone Number: 08/11/2022, 8:19 AM  Clinical Narrative:   No bed offers this morning.  Expected Discharge Plan: Memory Care Barriers to Discharge: Other (must enter comment) (Facility will not accept patient back)  Expected Discharge Plan and Services Expected Discharge Plan: Memory Care   Discharge Planning Services: CM Consult   Living arrangements for the past 2 months: Group Home                 DME Arranged: N/A DME Agency: NA       HH Arranged: NA HH Agency: NA         Social Determinants of Health (SDOH) Interventions    Readmission Risk Interventions     No data to display

## 2022-08-12 DIAGNOSIS — F03918 Unspecified dementia, unspecified severity, with other behavioral disturbance: Secondary | ICD-10-CM | POA: Diagnosis not present

## 2022-08-12 NOTE — Plan of Care (Signed)

## 2022-08-12 NOTE — Progress Notes (Signed)
  PROGRESS NOTE    Larry Buckley  [id number]:[id number] DOB: [birth year] DOA: 07/15/2022 PCP: Larry Lick, NP  [id number]  LOS: 33 days   Brief hospital course:   Assessment & Plan: Larry Buckley is a 81 y.o. male with medical history significant for dementia with behavioral disturbance, hypertension, hyperlipidemia, AAA with repair, gout, who initially presented to [facility] ED after aggressive and combative behavior at the group home.  He was brought into the ED on 07/12/2022 via EMS under IVC.  He has been staying in the ED, seen by psychiatry and social worker, awaiting placement.  Admitted to hospitalist service on 9/19 as patient has increased weakness. Patient has significant agitation, barely eating.  Previously seen by palliative care.  Off-and-on patient has required Geodon.  Psychiatry adjusted Risperdal and Seroquel.  Now overall remained stable awaiting placement.  Hospital course also complicated by urinary retention and Foley catheter placement by urology but eventually patient pulled it out and Flomax was added.  TOC team working on placement  Dementia with behavioral disturbance ([name]) --psych consulted --Continue Depakote, Remeron, Seroquel, Risperdal and as needed Haldol.     Essential hypertension --cont amlodipine   Hematuria, resolved Urinary retention, resolved -Initial issues with retention requiring Foley catheter placement but due to blockage urology was consulted who placed Foley catheter was left in for a week and eventually removed.   --Continue Flomax   Protein-calorie malnutrition, severe --supplements per dietician   Failure to thrive in adult Complicates overall prognosis.  Seen by palliative care in the past.     Central sleep apnea Monitor   Hyperlipidemia - Statin   DVT prophylaxis: Lovenox SQ Code Status: Full code  Family Communication:  Level of care: Med-Surg Dispo:   The patient is from: group home Anticipated d/c is to: to be  determined Anticipated d/c date is: whenever placement found   Subjective and Interval History:  No changes.  Pt eating well.   Objective: Vitals:   08/11/22 1539 08/11/22 1935 08/12/22 0350 08/12/22 0825  BP: (!) 143/64 135/70 119/71 (!) 163/65  Pulse: 71 92 82 72  Resp: 18 18 18 20   Temp: 98.1 F (36.7 C) 98.9 F (37.2 C) 98.6 F (37 C) 98.4 F (36.9 C)  TempSrc: Oral Oral  Oral  SpO2: 100% 100% 100% 95%  Weight:      Height:        Intake/Output Summary (Last 24 hours) at 08/12/2022 1324 Last data filed at 08/11/2022 1912 Gross per 24 hour  Intake 300 ml  Output --  Net 300 ml   Filed Weights   07/04/22 2230 07/06/22 1533  Weight: 84 kg 82 kg    Examination:   Constitutional: NAD, dozing  HEENT: conjunctivae and lids normal, EOMI CV: No cyanosis.   RESP: normal respiratory effort, on RA SKIN: warm, dry   Data Reviewed: I have personally reviewed labs and imaging studies  Time spent: 25 minutes  Enzo Bi, MD [facility] If 7PM-7AM, please contact night-coverage 08/12/2022, 1:24 PM

## 2022-08-13 DIAGNOSIS — F03918 Unspecified dementia, unspecified severity, with other behavioral disturbance: Secondary | ICD-10-CM | POA: Diagnosis not present

## 2022-08-13 NOTE — Progress Notes (Signed)
  PROGRESS NOTE    Larry Buckley  ATF:[id number] DOB: 10-21-1940 DOA: Jul 13, 2022 PCP: Larry Lick, NP  [id number]-AA  LOS: 34 days   Brief hospital course:   Assessment & Plan: Larry Buckley is a 81 y.o. male with medical history significant for dementia with behavioral disturbance, hypertension, hyperlipidemia, AAA with repair, gout, who initially presented to [facility] ED after aggressive and combative behavior at the group home.  He was brought into the ED on 07-13-22 via EMS under IVC.  He has been staying in the ED, seen by psychiatry and social worker, awaiting placement.  Admitted to hospitalist service on 9/19 as patient has increased weakness. Patient has significant agitation, barely eating.  Previously seen by palliative care.  Off-and-on patient has required Geodon.  Psychiatry adjusted Risperdal and Seroquel.  Now overall remained stable awaiting placement.  Hospital course also complicated by urinary retention and Foley catheter placement by urology but eventually patient pulled it out and Flomax was added.  TOC team working on placement  Dementia with behavioral disturbance ([place]) --psych consulted --Continue Depakote, Remeron, Seroquel, Risperdal and as needed Haldol.     Essential hypertension --cont amlodipine   Hematuria, resolved Urinary retention, resolved -Initial issues with retention requiring Foley catheter placement but due to blockage urology was consulted who placed Foley catheter was left in for a week and eventually removed.   --Continue Flomax   Protein-calorie malnutrition, severe --supplements per dietician   Failure to thrive in adult Complicates overall prognosis.  Seen by palliative care in the past.     Central sleep apnea Monitor   Hyperlipidemia - Statin   DVT prophylaxis: Lovenox SQ Code Status: Full code  Family Communication:  Level of care: Med-Surg Dispo:   The patient is from: group home Anticipated d/c is to: to be  determined Anticipated d/c date is: whenever placement found   Subjective and Interval History:  Pt was awake, but staring off into space.     Objective: Vitals:   08/12/22 1525 08/12/22 1940 08/13/22 0437 08/13/22 0743  BP: (!) 140/66 (!) 153/69 137/64 (!) 146/68  Pulse: 81 83 66 68  Resp: 18 20 18 14   Temp: 98 F (36.7 C) 98.7 F (37.1 C) 98.1 F (36.7 C) 98.2 F (36.8 C)  TempSrc: Oral Oral  Oral  SpO2: 96% 95% 100% 100%  Weight:      Height:        Intake/Output Summary (Last 24 hours) at 08/13/2022 1543 Last data filed at 08/13/2022 0500 Gross per 24 hour  Intake --  Output 550 ml  Net -550 ml   Filed Weights   07/04/22 2230 07/06/22 1533  Weight: 84 kg 82 kg    Examination:   Constitutional: NAD, awake and staring off into space HEENT: conjunctivae and lids normal, EOMI CV: No cyanosis.   RESP: normal respiratory effort, on RA Psych: subdued mood and affect.     Data Reviewed: I have personally reviewed labs and imaging studies  Time spent: 25 minutes  Enzo Bi, MD [facility] If 7PM-7AM, please contact night-coverage 08/13/2022, 3:43 PM

## 2022-08-14 ENCOUNTER — Encounter (INDEPENDENT_AMBULATORY_CARE_PROVIDER_SITE_OTHER): Payer: Self-pay

## 2022-08-14 DIAGNOSIS — F03918 Unspecified dementia, unspecified severity, with other behavioral disturbance: Secondary | ICD-10-CM | POA: Diagnosis not present

## 2022-08-14 NOTE — Progress Notes (Signed)
  PROGRESS NOTE    Larry Buckley  QVZ:[id number] DOB: 12-Mar-1941 DOA: 07/11/22 PCP: Larry Lick, NP  [id number]  LOS: 35 days   Brief hospital course:   Assessment & Plan: Larry Buckley is a 81 y.o. male with medical history significant for dementia with behavioral disturbance, hypertension, hyperlipidemia, AAA with repair, gout, who initially presented to [facility] ED after aggressive and combative behavior at the group home.  He was brought into the ED on 2022-07-11 via EMS under IVC.  He has been staying in the ED, seen by psychiatry and social worker, awaiting placement.  Admitted to hospitalist service on 9/19 as patient has increased weakness. Patient has significant agitation, barely eating.  Previously seen by palliative care.  Off-and-on patient has required Geodon.  Psychiatry adjusted Risperdal and Seroquel.  Now overall remained stable awaiting placement.  Hospital course also complicated by urinary retention and Foley catheter placement by urology but eventually patient pulled it out and Flomax was added.  TOC team working on placement  Dementia with behavioral disturbance ([place]) --psych consulted --Continue Depakote, Remeron, Seroquel, Risperdal and as needed Haldol.     Essential hypertension --cont amlodipine   Hematuria, resolved Urinary retention, resolved -Initial issues with retention requiring Foley catheter placement but due to blockage urology was consulted who placed Foley catheter was left in for a week and eventually removed.   --Continue Flomax   Protein-calorie malnutrition, severe --supplements per dietician   Failure to thrive in adult Complicates overall prognosis.  Seen by palliative care in the past.     Central sleep apnea Monitor   Hyperlipidemia - Statin   DVT prophylaxis: Lovenox SQ Code Status: Full code  Family Communication:  Level of care: Med-Surg Dispo:   The patient is from: group home Anticipated d/c is to: to be  determined Anticipated d/c date is: whenever placement found   Subjective and Interval History:  No change.     Objective: Vitals:   08/13/22 2047 08/14/22 0400 08/14/22 0821 08/14/22 1536  BP: 126/79 130/65 (!) 131/56 129/62  Pulse: 100 67 (!) 46 93  Resp: 15 14 18 18   Temp: 99.4 F (37.4 C) 97.7 F (36.5 C) 98.1 F (36.7 C) 98.4 F (36.9 C)  TempSrc: Oral Axillary    SpO2: 100% 100% 100% 94%  Weight:      Height:        Intake/Output Summary (Last 24 hours) at 08/14/2022 1754 Last data filed at 08/14/2022 1500 Gross per 24 hour  Intake 0 ml  Output 1000 ml  Net -1000 ml   Filed Weights   07/04/22 2230 07/06/22 1533  Weight: 84 kg 82 kg    Examination:   Constitutional: NAD, mittens on CV: No cyanosis.   RESP: normal respiratory effort, on RA SKIN: warm, dry   Data Reviewed: I have personally reviewed labs and imaging studies  Time spent: 25 minutes  Larry Bi, MD [facility] If 7PM-7AM, please contact night-coverage 08/14/2022, 5:54 PM

## 2022-08-14 NOTE — Progress Notes (Signed)
Nutrition Follow Up Note   DOCUMENTATION CODES:   Severe malnutrition in context of social or environmental circumstances  INTERVENTION:   Ensure Enlive po TID, each supplement provides 350 kcal and 20 grams of protein.  Magic cup TID with meals, each supplement provides 290 kcal and 9 grams of protein  MVI po daily   Dysphagia 3 diet   Assist with meals   NUTRITION DIAGNOSIS:   Severe Malnutrition related to social / environmental circumstances as evidenced by severe fat depletion, severe muscle depletion, 8 percent weight loss in 2 months.  GOAL:   Patient will meet greater than or equal to 90% of their needs -progressing   MONITOR:   PO intake, Supplement acceptance, Labs, Weight trends, Skin, I & O's  ASSESSMENT:   81 y.o. male with medical history significant for dementia with behavioral disturbance, hypertension, hyperlipidemia, AAA with repair and gout who is admitted after aggressive and combative behavior at the group home.  Pt with inconsistent appetite and oral intake; pt eating anywhere from 0-100% of meals but does better with meal assistance. Pt is being offered Ensure and Magic Cups. Pt remains on appetite stimulant. No weight since 9/21; pt is ordered for weekly weights but none documented. Pt is currently pending placement to memory care unit.    Medications reviewed and include: lovenox, melatonin, remeron, MVI, miralax, senokot  Labs reviewed: none recent   Diet Order:   Diet Order             DIET DYS 3 Room service appropriate? No; Fluid consistency: Thin  Diet effective now                  EDUCATION NEEDS:   No education needs have been identified at this time  Skin:  Skin Assessment: Reviewed RN Assessment  Last BM:  10/29- type 6  Height:   Ht Readings from Last 1 Encounters:  08/09/22 6\' 2"  (1.88 m)    Weight:   Wt Readings from Last 1 Encounters:  07/06/22 82 kg    Ideal Body Weight:  86.3 kg  BMI:  Body mass index  is 23.21 kg/m.  Estimated Nutritional Needs:   Kcal:  2100-2400kcal/day  Protein:  105-120g/day  Fluid:  2.1-2.4L/day  Koleen Distance MS, RD, LDN Please refer to [facility] for RD and/or RD on-call/weekend/after hours pager

## 2022-08-14 NOTE — TOC Progression Note (Signed)
Transition of Care [facility]) - Progression Note    Patient Details  Name: Larry Buckley MRN: [id number] Date of Birth: Nov 03, 1940  Transition of Care [facility]) CM/SW [place], LCSW Phone Number: 08/14/2022, 8:37 AM  Clinical Narrative:   No bed offers this morning. TOC continues to send clinicals to facilities that have not responded.  Expected Discharge Plan: Memory Care Barriers to Discharge: Other (must enter comment) (Facility will not accept patient back)  Expected Discharge Plan and Services Expected Discharge Plan: Memory Care   Discharge Planning Services: CM Consult   Living arrangements for the past 2 months: Group Home                 DME Arranged: N/A DME Agency: NA       HH Arranged: NA HH Agency: NA         Social Determinants of Health (SDOH) Interventions    Readmission Risk Interventions     No data to display

## 2022-08-15 DIAGNOSIS — F03918 Unspecified dementia, unspecified severity, with other behavioral disturbance: Secondary | ICD-10-CM | POA: Diagnosis not present

## 2022-08-15 NOTE — TOC Progression Note (Addendum)
Transition of Care [facility]) - Progression Note    Patient Details  Name: Larry Buckley MRN: [id number] Date of Birth: [birth year]  Transition of Care [facility]) CM/SW [place], LCSW Phone Number: 08/15/2022, 10:27 AM  Clinical Narrative:   [facility] in [place] put that they are considering him on the hub. Admissions coordinator is checking to see what additional information they need.  2:16 pm: [facility] cannot offer a bed.  2:25 pm: Spoke to [employer], regional clinical liaison for [place]. He will review referral and see if he can assist with placement.  Expected Discharge Plan: Memory Care Barriers to Discharge: Other (must enter comment) (Facility will not accept patient back)  Expected Discharge Plan and Services Expected Discharge Plan: Memory Care   Discharge Planning Services: CM Consult   Living arrangements for the past 2 months: Group Home                 DME Arranged: N/A DME Agency: NA       HH Arranged: NA HH Agency: NA         Social Determinants of Health (SDOH) Interventions    Readmission Risk Interventions     No data to display

## 2022-08-15 NOTE — Plan of Care (Signed)

## 2022-08-15 NOTE — Progress Notes (Signed)
  PROGRESS NOTE    Larry Buckley  ZOX:[id number] DOB: 1941/06/27 DOA: 27-Jul-2022 PCP: Venita Lick, NP  [id number]/[id number]-AA  LOS: 36 days   Brief hospital course:   Assessment & Plan: Larry Buckley is a 81 y.o. male with medical history significant for dementia with behavioral disturbance, hypertension, hyperlipidemia, AAA with repair, gout, who initially presented to [facility] ED after aggressive and combative behavior at the group home.  He was brought into the ED on 2022/07/27 via EMS under IVC.  He has been staying in the ED, seen by psychiatry and social worker, awaiting placement.  Admitted to hospitalist service on 9/19 as patient has increased weakness. Patient has significant agitation, barely eating.  Previously seen by palliative care.  Off-and-on patient has required Geodon.  Psychiatry adjusted Risperdal and Seroquel.  Now overall remained stable awaiting placement.  Hospital course also complicated by urinary retention and Foley catheter placement by urology but eventually patient pulled it out and Flomax was added.  TOC team working on placement  Dementia with behavioral disturbance ([place]) --psych consulted --Continue Depakote, Remeron, Seroquel, Risperdal and as needed Haldol.     Essential hypertension --cont amlodipine   Hematuria, resolved Urinary retention, resolved -Initial issues with retention requiring Foley catheter placement but due to blockage urology was consulted who placed Foley catheter was left in for a week and eventually removed.   --Continue Flomax   Protein-calorie malnutrition, severe --supplements per dietician   Failure to thrive in adult Complicates overall prognosis.  Seen by palliative care in the past.     Central sleep apnea Monitor   Hyperlipidemia - Statin   DVT prophylaxis: Lovenox SQ Code Status: Full code  Family Communication:  Level of care: Med-Surg Dispo:   The patient is from: group home Anticipated d/c is to: to be  determined Anticipated d/c date is: whenever placement found   Subjective and Interval History:  No change.    Objective: Vitals:   08/14/22 2050 08/15/22 0457 08/15/22 0825 08/15/22 1607  BP: 138/71 (!) 150/75 (!) 132/58 123/65  Pulse: 88 81 64 71  Resp: 18 18 18 16   Temp:  99 F (37.2 C) (!) 97.3 F (36.3 C) 98.4 F (36.9 C)  TempSrc:  Oral Oral   SpO2: 97% 100% 100% 99%  Weight:      Height:       No intake or output data in the 24 hours ending 08/15/22 1931  Filed Weights   07/04/22 2230 07/06/22 1533  Weight: 84 kg 82 kg    Examination:   Constitutional: NAD, alert, mittens on HEENT: conjunctivae and lids normal, EOMI CV: No cyanosis.   RESP: normal respiratory effort, on RA Neuro: II - XII grossly intact.     Data Reviewed: I have personally reviewed labs and imaging studies  Time spent: 25 minutes  Enzo Bi, MD [facility] If 7PM-7AM, please contact night-coverage 08/15/2022, 7:31 PM

## 2022-08-16 DIAGNOSIS — F03918 Unspecified dementia, unspecified severity, with other behavioral disturbance: Secondary | ICD-10-CM | POA: Diagnosis not present

## 2022-08-16 NOTE — Progress Notes (Addendum)
9528 - report received, pt repositioned with off-going nurse 2052 - scheduled medication administered 2305 - sitting up in bed, no complaints 0156 - sitting up in bed, no complaints 0300 - fluids offered and accepted, no complaints 0530 - resting with eyes closed, respirations even and unlabored, no obvious signs or symptoms of distress or discomfort

## 2022-08-16 NOTE — Progress Notes (Signed)
[facility] [place] [facility] [place] NAME: Larry Buckley    MR#:  [id number]  DATE OF BIRTH:  [birth year]  SUBJECTIVE:   Patient has dementia at baseline. Not CSN noticed some of the questions. Being fed lunch earlier by CNA. No issues per RN.   VITALS:  Blood pressure (!) 145/96, pulse 86, temperature 98 F (36.7 C), resp. rate 17, height 6\' 2"  (1.88 m), weight 82 kg, SpO2 100 %.  PHYSICAL EXAMINATION:   GENERAL:  81 y.o.-year-old patient lying in the bed with no acute distress.  LUNGS: Normal breath sounds bilaterally, no wheezing CARDIOVASCULAR: S1, S2 normal. No murmurs,   ABDOMEN: Soft, nontender, nondistended. Bowel sounds present.  EXTREMITIES: No  edema b/l.    NEUROLOGIC: nonfocal  patient is alert and awake, dementia at baseline SKIN: per RN Assessment and Plan  Larry Buckley is a 81 y.o. male with medical history significant for dementia with behavioral disturbance, hypertension, hyperlipidemia, AAA with repair, gout, who initially presented to [facility] ED after aggressive and combative behavior at the group home.  He was brought into the ED on June 28, 2022 via EMS under IVC.  He has been staying in the ED, seen by psychiatry and social worker, awaiting placement.  Admitted to hospitalist service on 9/19 as patient has increased weakness. Psychiatry adjusted Risperdal and Seroquel.  Now overall remained stable awaiting placement.  Hospital course also complicated by urinary retention and Foley catheter placement by urology but eventually patient pulled it out and Flomax was added.  TOC team working on placement   Dementia with behavioral disturbance ([place]) --psych consulted --Continue Depakote, Remeron, Seroquel, Risperdal and as needed Haldol.     Essential hypertension --cont amlodipine   Hematuria, resolved Urinary retention, resolved -Initial issues with retention requiring Foley catheter placement but due to blockage urology was consulted who  placed Foley catheter was left in for a week and eventually removed.   --Continue Flomax   Protein-calorie malnutrition, severe --supplements per dietician   Failure to thrive in adult Complicates overall prognosis.  Seen by palliative care in the past.     Central sleep apnea Monitor   Hyperlipidemia - Statin     DVT prophylaxis: Lovenox SQ Code Status: Full code  Family Communication: none Level of care: Med-Surg Dispo:   The patient is from: group home Anticipated d/c is to: to be determined Anticipated d/c date is: whenever placement found       TOTAL TIME TAKING CARE OF THIS PATIENT: 25 minutes.  >50% time spent on counselling and coordination of care  Note: This dictation was prepared with Dragon dictation along with smaller phrase technology. Any transcriptional errors that result from this process are unintentional.  Fritzi Mandes M.D    [facility]   CC: Primary care physician; Venita Lick, NP

## 2022-08-17 DIAGNOSIS — F03918 Unspecified dementia, unspecified severity, with other behavioral disturbance: Secondary | ICD-10-CM | POA: Diagnosis not present

## 2022-08-17 NOTE — Progress Notes (Signed)
[facility] [place] at [place] NAME: Larry Buckley    MR#:  [id number]  DATE OF BIRTH:  1941/02/04  SUBJECTIVE:   Patient has dementia at baseline.No issues per RN.   VITALS:  Blood pressure 117/74, pulse 77, temperature 97.9 F (36.6 C), temperature source Oral, resp. rate 14, height 6\' 2"  (1.88 m), weight 82 kg, SpO2 100 %.  PHYSICAL EXAMINATION:   GENERAL:  81 y.o.-year-old patient lying in the bed with no acute distress.  LUNGS: Normal breath sounds bilaterally, no wheezing CARDIOVASCULAR: S1, S2 normal. No murmurs,   NEUROLOGIC: nonfocal  patient is awake, dementia at baseline SKIN: per RN   Assessment and Plan  Larry Buckley is a 81 y.o. male with medical history significant for dementia with behavioral disturbance, hypertension, hyperlipidemia, AAA with repair, gout, who initially presented to [facility] ED after aggressive and combative behavior at the group home.  He was brought into the ED on 06/18/2022 via EMS under IVC.  TOC team working on placement   Dementia with behavioral disturbance (Larry Buckley) --psych consulted --Continue Depakote, Remeron, Seroquel, Risperdal and as needed Haldol.     Essential hypertension --cont amlodipine   Hematuria, resolved Urinary retention, resolved -Initial issues with retention requiring Foley catheter placement but due to blockage urology was consulted who placed Foley catheter was left in for a week and eventually removed.   --Continue Flomax   Protein-calorie malnutrition, severe --supplements per dietician   Failure to thrive in adult Complicates overall prognosis.  Seen by palliative care in the past.     Central sleep apnea Monitor   Hyperlipidemia - Statin     DVT prophylaxis: Lovenox SQ Code Status: Full code  Family Communication: none Level of care: Med-Surg Dispo:   The patient is from: group home Anticipated d/c is to: to be determined Anticipated d/c date is: whenever  placement found       TOTAL TIME TAKING CARE OF THIS PATIENT: 25 minutes.  >50% time spent on counselling and coordination of care  Note: This dictation was prepared with Dragon dictation along with smaller phrase technology. Any transcriptional errors that result from this process are unintentional.  Fritzi Mandes M.D    [facility]   CC: Primary care physician; Venita Lick, NP

## 2022-08-17 NOTE — TOC Progression Note (Signed)
Transition of Care [facility]) - Progression Note    Patient Details  Name: Larry Buckley MRN: [id number] Date of Birth: 09-Feb-1941  Transition of Care [facility]) CM/SW [place], LCSW Phone Number: 08/17/2022, 10:38 AM  Clinical Narrative:   No updates from [place]. Liaison is hoping to have an update by tomorrow.  Expected Discharge Plan: Memory Care Barriers to Discharge: Other (must enter comment) (Facility will not accept patient back)  Expected Discharge Plan and Services Expected Discharge Plan: Memory Care   Discharge Planning Services: CM Consult   Living arrangements for the past 2 months: Group Home                 DME Arranged: N/A DME Agency: NA       HH Arranged: NA HH Agency: NA         Social Determinants of Health (SDOH) Interventions    Readmission Risk Interventions     No data to display

## 2022-08-17 NOTE — Progress Notes (Addendum)
61 - received report 2046 scheduled medication administration 2300 - pt restless, repositioned with CNA Hailey, fluids offered and accepted 0015 - pt resting with eyes closed, respirations even and unlabored, no obvious signs of distress or discomfort noted 0156 - pt resting in bed, denies needs 0349 - pt resting with eyes closed, respirations even and unlabored, no obvious signs of distress or discomfort noted 0515 - continues to rest with eyes closed, respirations even and unlabored, no signs of distress 0630 - pt repositioned with CNA Oley Balm

## 2022-08-17 NOTE — Progress Notes (Addendum)
61 - received report 2154 - scheduled medication administration 2330 - resting with eyes closed, respirations even and unlabored, no obvious signs or symptoms of distress or discomfort noted 0138 - resting with eyes closed, respirations even and unlabored, no obvious signs or symptoms of distress or discomfort noted 0330 - pt continues to rest with eyes closed, respirations even and unlabored, no obvious signs of distress or discomfort noted

## 2022-08-18 DIAGNOSIS — F03918 Unspecified dementia, unspecified severity, with other behavioral disturbance: Secondary | ICD-10-CM | POA: Diagnosis not present

## 2022-08-18 LAB — BASIC METABOLIC PANEL
Anion gap: 6 (ref 5–15)
BUN: 24 mg/dL — ABNORMAL HIGH (ref 8–23)
CO2: 26 mmol/L (ref 22–32)
Calcium: 8.7 mg/dL — ABNORMAL LOW (ref 8.9–10.3)
Chloride: 110 mmol/L (ref 98–111)
Creatinine, Ser: 0.72 mg/dL (ref 0.61–1.24)
GFR, Estimated: >60 mL/min (ref >60)
Glucose, Bld: 98 mg/dL (ref 70–99)
Potassium: 4.1 mmol/L (ref 3.5–5.1)
Sodium: 142 mmol/L (ref 135–145)

## 2022-08-18 LAB — CBC
HCT: 31.8 % — ABNORMAL LOW (ref 39.0–52.0)
Hemoglobin: 10.4 g/dL — ABNORMAL LOW (ref 13.0–17.0)
MCH: 30.5 pg (ref 26.0–34.0)
MCHC: 32.7 g/dL (ref 30.0–36.0)
MCV: 93.3 fL (ref 80.0–100.0)
Platelets: 341 10*3/uL (ref 150–400)
RBC: 3.41 MIL/uL — ABNORMAL LOW (ref 4.22–5.81)
RDW: 13.7 % (ref 11.5–15.5)
WBC: 5 10*3/uL (ref 4.0–10.5)
nRBC: 0 % (ref 0.0–0.2)

## 2022-08-18 NOTE — TOC Progression Note (Addendum)
Transition of Care [facility]) - Progression Note    Patient Details  Name: Larry Buckley MRN: [id number] Date of Birth: 12/31/1940  Transition of Care [facility]) CM/SW [place], LCSW Phone Number: 08/18/2022, 11:02 AM  Clinical Narrative:   Spoke to liaison for [place]. He is looking for memory care placement and may have found a place for him to go. Issue is prn Haldol. It has to be either scheduled or discontinued. Unable to see MAR history since past 10/31 but it appears he has not received it since before that date. MD will discontinue it. [facility] liaison will come assess him on Monday.  Expected Discharge Plan: Memory Care Barriers to Discharge: Other (must enter comment) (Facility will not accept patient back)  Expected Discharge Plan and Services Expected Discharge Plan: Memory Care   Discharge Planning Services: CM Consult   Living arrangements for the past 2 months: Group Home                 DME Arranged: N/A DME Agency: NA       HH Arranged: NA HH Agency: NA         Social Determinants of Health (SDOH) Interventions    Readmission Risk Interventions     No data to display

## 2022-08-18 NOTE — Progress Notes (Signed)
[facility] [place] at [place] NAME: Larry Buckley    MR#:  [id number]  DATE OF BIRTH:  27-Apr-1941  SUBJECTIVE:   Patient has dementia at baseline.No issues per RN.   VITALS:  Blood pressure (!) 106/54, pulse 62, temperature 97.8 F (36.6 C), temperature source Oral, resp. rate 14, height 6\' 2"  (1.88 m), weight 82 kg, SpO2 100 %.  PHYSICAL EXAMINATION:   GENERAL:  81 y.o.-year-old patient lying in the bed with no acute distress.  LUNGS: Normal breath sounds bilaterally, no wheezing CARDIOVASCULAR: S1, S2 normal. No murmurs,   NEUROLOGIC: nonfocal  patient is awake, dementia at baseline SKIN: per RN   Assessment and Plan  Larry Buckley is a 81 y.o. male with medical history significant for dementia with behavioral disturbance, hypertension, hyperlipidemia, AAA with repair, gout, who initially presented to [facility] ED after aggressive and combative behavior at the group home.  He was brought into the ED on 06/30/2022 via EMS under IVC.  TOC team working on placement   Dementia with behavioral disturbance ([place]) --psych consulted --Continue Depakote, Remeron, Seroquel, Risperdal  --pt has been cooperative    Essential hypertension --cont amlodipine   Hematuria, resolved Urinary retention, resolved -Initial issues with retention requiring Foley catheter placement but due to blockage urology was consulted who placed Foley catheter was left in for a week and eventually removed.   --Continue Flomax   Protein-calorie malnutrition, severe --supplements per dietician   Failure to thrive in adult Complicates overall prognosis.  Seen by palliative care in the past.     Central sleep apnea Monitor   Hyperlipidemia - Statin     DVT prophylaxis: Lovenox SQ Code Status: Full code  Family Communication: none Level of care: Med-Surg Dispo:   The patient is from: group home Anticipated d/c is to: to be determined Anticipated d/c date is:  whenever placement found       TOTAL TIME TAKING CARE OF THIS PATIENT: 25 minutes.  >50% time spent on counselling and coordination of care  Note: This dictation was prepared with Dragon dictation along with smaller phrase technology. Any transcriptional errors that result from this process are unintentional.  Fritzi Mandes M.D    [facility]   CC: Primary care physician; Venita Lick, NP

## 2022-08-18 NOTE — Progress Notes (Signed)
Mobility Specialist - Progress Note    08/18/22 1400  Mobility  Activity Stood at bedside;Dangled on edge of bed  Level of Assistance Dependent, patient does less than 25%  Assistive Device Front wheel walker  Activity Response Tolerated fair  $Mobility charge 1 Mobility    Responded to bed alarm. Pt attempting to get out of bed on arrival. Pt came into sitting EOB with maxA. Significant R lateral lean in sitting. Unable to attain full standing position this date with maxA +2. Pt returned supine with alarm set, needs in reach.    Kathee Delton Mobility Specialist 08/18/22, 2:29 PM

## 2022-08-19 DIAGNOSIS — F03918 Unspecified dementia, unspecified severity, with other behavioral disturbance: Secondary | ICD-10-CM | POA: Diagnosis not present

## 2022-08-19 NOTE — Progress Notes (Signed)
[facility] [place] at [place] NAME: Larry Buckley    MR#:  [id number]  DATE OF BIRTH:  02/24/1941  SUBJECTIVE:   Patient has dementia at baseline.No issues per RN.   VITALS:  Blood pressure 115/64, pulse 64, temperature 98 F (36.7 C), temperature source Oral, resp. rate 16, height 6\' 2"  (1.88 m), weight 82 kg, SpO2 97 %.  PHYSICAL EXAMINATION:   GENERAL:  81 y.o.-year-old patient lying in the bed with no acute distress.  LUNGS: Normal breath sounds bilaterally, no wheezing CARDIOVASCULAR: S1, S2 normal. No murmurs,   NEUROLOGIC: nonfocal  patient is awake, dementia at baseline SKIN: per RN   Assessment and Plan  Larry Buckley is a 81 y.o. male with medical history significant for dementia with behavioral disturbance, hypertension, hyperlipidemia, AAA with repair, gout, who initially presented to [facility] ED after aggressive and combative behavior at the group home.  He was brought into the ED on 06/25/2022 via EMS under IVC.  TOC team working on placement   Dementia with behavioral disturbance ([place]) --psych consulted --Continue Depakote, Remeron, Seroquel, Risperdal  --pt has been cooperative    Essential hypertension --cont amlodipine   Hematuria, resolved Urinary retention, resolved -Initial issues with retention requiring Foley catheter placement but due to blockage urology was consulted who placed Foley catheter was left in for a week and eventually removed.   --Continue Flomax   Protein-calorie malnutrition, severe --supplements per dietician   Failure to thrive in adult Complicates overall prognosis.  Seen by palliative care in the past.     Central sleep apnea Monitor   Hyperlipidemia - Statin     DVT prophylaxis: Lovenox SQ Code Status: Full code  Family Communication: none Level of care: Med-Surg Dispo:   The patient is from: group home Anticipated d/c is to: to be determined Anticipated d/c date is: whenever  placement found       TOTAL TIME TAKING CARE OF THIS PATIENT: 25 minutes.  >50% time spent on counselling and coordination of care  Note: This dictation was prepared with Dragon dictation along with smaller phrase technology. Any transcriptional errors that result from this process are unintentional.  Fritzi Mandes M.D    [facility]   CC: Primary care physician; Venita Lick, NP

## 2022-08-20 DIAGNOSIS — F03918 Unspecified dementia, unspecified severity, with other behavioral disturbance: Secondary | ICD-10-CM | POA: Diagnosis not present

## 2022-08-20 NOTE — Progress Notes (Signed)
[name] at [place] NAME: Larry Buckley    MR#:  [id number]  DATE OF BIRTH:  Feb 18, 1941  SUBJECTIVE:   Patient has dementia at baseline.No issues per RN.   VITALS:  Blood pressure (!) 122/59, pulse 75, temperature 97.7 F (36.5 C), temperature source Oral, resp. rate 16, height 6\' 2"  (1.88 m), weight 82 kg, SpO2 100 %.  PHYSICAL EXAMINATION:   GENERAL:  81 y.o.-year-old patient lying in the bed with no acute distress.  LUNGS: Normal breath sounds bilaterally, no wheezing CARDIOVASCULAR: S1, S2 normal. No murmurs,   NEUROLOGIC: nonfocal  patient is awake, dementia at baseline SKIN: per RN   Assessment and Plan  Larry Buckley is a 81 y.o. male with medical history significant for dementia with behavioral disturbance, hypertension, hyperlipidemia, AAA with repair, gout, who initially presented to [facility] ED after aggressive and combative behavior at the group home.  He was brought into the ED on 06/16/2022 via EMS under IVC.  TOC team working on placement   Dementia with behavioral disturbance ([place]) --psych consulted --Continue Depakote, Remeron, Seroquel, Risperdal  --pt has been cooperative    Essential hypertension --cont amlodipine   Hematuria, resolved Urinary retention, resolved -Initial issues with retention requiring Foley catheter placement but due to blockage urology was consulted who placed Foley catheter was left in for a week and eventually removed.   --Continue Flomax   Protein-calorie malnutrition, severe --supplements per dietician   Failure to thrive in adult Complicates overall prognosis.  Seen by palliative care in the past.     Central sleep apnea Monitor   Hyperlipidemia - Statin     DVT prophylaxis: Lovenox SQ Code Status: Full code  Family Communication: none Level of care: Med-Surg Dispo:   The patient is from: group home Anticipated d/c is to: to be determined Anticipated d/c date is:  whenever placement found       TOTAL TIME TAKING CARE OF THIS PATIENT: 25 minutes.  >50% time spent on counselling and coordination of care  Note: This dictation was prepared with Dragon dictation along with smaller phrase technology. Any transcriptional errors that result from this process are unintentional.  Fritzi Mandes M.D    [facility]   CC: Primary care physician; Venita Lick, NP

## 2022-08-21 DIAGNOSIS — F03918 Unspecified dementia, unspecified severity, with other behavioral disturbance: Secondary | ICD-10-CM | POA: Diagnosis not present

## 2022-08-21 NOTE — Progress Notes (Signed)
Nutrition Follow Up Note   DOCUMENTATION CODES:   Severe malnutrition in context of social or environmental circumstances  INTERVENTION:   Ensure Enlive po TID, each supplement provides 350 kcal and 20 grams of protein.  Magic cup TID with meals, each supplement provides 290 kcal and 9 grams of protein  MVI po daily   Dysphagia 3 diet   Assist with meals   NUTRITION DIAGNOSIS:   Severe Malnutrition related to social / environmental circumstances as evidenced by severe fat depletion, severe muscle depletion, 8 percent weight loss in 2 months.  GOAL:   Patient will meet greater than or equal to 90% of their needs -progressing   MONITOR:   PO intake, Supplement acceptance, Labs, Weight trends, Skin, I & O's  ASSESSMENT:   81 y.o. male with medical history significant for dementia with behavioral disturbance, hypertension, hyperlipidemia, AAA with repair and gout who is admitted after aggressive and combative behavior at the group home.  Pt with inconsistent appetite and oral intake; pt eating anywhere from 0-100% of meals but does better with meal assistance. Pt is being offered Ensure and Magic Cups. Pt remains on appetite stimulant. No weight since 9/21; pt is ordered for weekly weights but none documented. Pt is currently pending placement to memory care unit.    Medications reviewed and include: lovenox, melatonin, remeron, MVI, miralax, senokot  Labs reviewed: none recent   Diet Order:   Diet Order             DIET DYS 3 Room service appropriate? No; Fluid consistency: Thin  Diet effective now                  EDUCATION NEEDS:   No education needs have been identified at this time  Skin:  Skin Assessment: Reviewed RN Assessment  Last BM:  11/5- type 4  Height:   Ht Readings from Last 1 Encounters:  08/09/22 6\' 2"  (1.88 m)    Weight:   Wt Readings from Last 1 Encounters:  07/06/22 82 kg    Ideal Body Weight:  86.3 kg  BMI:  Body mass index  is 23.21 kg/m.  Estimated Nutritional Needs:   Kcal:  2100-2400kcal/day  Protein:  105-120g/day  Fluid:  2.1-2.4L/day  Koleen Distance MS, RD, LDN Please refer to [facility] for RD and/or RD on-call/weekend/after hours pager

## 2022-08-21 NOTE — Progress Notes (Signed)
[facility] [place] [facility] [place] NAME: Larry Buckley    MR#:  [id number]  DATE OF BIRTH:  08-26-1941  SUBJECTIVE:   Patient has dementia at baseline.No issues per RN.   VITALS:  Blood pressure (!) 143/65, pulse (!) 56, temperature 97.9 F (36.6 C), resp. rate 18, height 6\' 2"  (1.88 m), weight 82 kg, SpO2 100 %.  PHYSICAL EXAMINATION:   GENERAL:  81 y.o.-year-old patient lying in the bed with no acute distress.  LUNGS: Normal breath sounds bilaterally, no wheezing CARDIOVASCULAR: S1, S2 normal. No murmurs,   NEUROLOGIC: nonfocal  patient is awake, dementia at baseline SKIN: per RN   Assessment and Plan  Larry Buckley is a 81 y.o. male with medical history significant for dementia with behavioral disturbance, hypertension, hyperlipidemia, AAA with repair, gout, who initially presented to [facility] ED after aggressive and combative behavior at the group home.  He was brought into the ED on 2022/07/01 via EMS under IVC.  TOC team working on placement   Dementia with behavioral disturbance ([place]) --psych consulted --Continue Depakote, Remeron, Seroquel, Risperdal  --pt has been cooperative    Essential hypertension --cont amlodipine   Hematuria, resolved Urinary retention, resolved -Initial issues with retention requiring Foley catheter placement but due to blockage urology was consulted who placed Foley catheter was left in for a week and eventually removed.   --Continue Flomax   Protein-calorie malnutrition, severe --supplements per dietician   Failure to thrive in adult Complicates overall prognosis.  Seen by palliative care in the past.     Central sleep apnea Monitor   Hyperlipidemia - Statin     DVT prophylaxis: Lovenox SQ Code Status: Full code  Family Communication: none Level of care: Med-Surg Dispo:   The patient is from: group home Anticipated d/c is to: to be determined Anticipated d/c date is: whenever placement found        TOTAL TIME TAKING CARE OF THIS PATIENT: 25 minutes.  >50% time spent on counselling and coordination of care  Note: This dictation was prepared with Dragon dictation along with smaller phrase technology. Any transcriptional errors that result from this process are unintentional.  Fritzi Mandes M.D    [facility]   CC: Primary care physician; Venita Lick, NP

## 2022-08-22 DIAGNOSIS — F03918 Unspecified dementia, unspecified severity, with other behavioral disturbance: Secondary | ICD-10-CM | POA: Diagnosis not present

## 2022-08-22 NOTE — Progress Notes (Signed)
[facility] NAME: Stockton Nunley    MR#:  [id number]  DATE OF BIRTH:  Jan 25, 1941  SUBJECTIVE:   Patient has dementia at baseline.No issues per RN.   VITALS:  Blood pressure (!) 141/62, pulse (!) 57, temperature 98.3 F (36.8 C), resp. rate 18, height 6\' 2"  (1.88 m), weight 82 kg, SpO2 99 %.  PHYSICAL EXAMINATION:   GENERAL:  81 y.o.-year-old patient lying in the bed with no acute distress.  LUNGS: Normal breath sounds bilaterally, no wheezing CARDIOVASCULAR: S1, S2 normal. No murmurs,   NEUROLOGIC: nonfocal  patient is awake, dementia at baseline SKIN: per RN   Assessment and Plan  KAHIL AGNER is a 81 y.o. male with medical history significant for dementia with behavioral disturbance, hypertension, hyperlipidemia, AAA with repair, gout, who initially presented to [facility] ED after aggressive and combative behavior at the group home.  He was brought into the ED on 07/04/2022 via EMS under IVC.  TOC team working on placement   Dementia with behavioral disturbance ([place]) --psych consulted --Continue Depakote, Remeron, Seroquel, Risperdal  --pt has been cooperative    Essential hypertension --cont amlodipine   Hematuria, resolved Urinary retention, resolved -Initial issues with retention requiring Foley catheter placement but due to blockage urology was consulted who placed Foley catheter was left in for a week and eventually removed.   --Continue Flomax   Protein-calorie malnutrition, severe --supplements per dietician   Failure to thrive in adult Complicates overall prognosis.  Seen by palliative care in the past.     Central sleep apnea Monitor   Hyperlipidemia - Statin     DVT prophylaxis: Lovenox SQ Code Status: Full code  Family Communication: none Level of care: Med-Surg Dispo:   The patient is from: group home Anticipated d/c is to: to be determined Anticipated d/c date is: whenever placement found        TOTAL TIME TAKING CARE OF THIS PATIENT: 25 minutes.  >50% time spent on counselling and coordination of care  Note: This dictation was prepared with Dragon dictation along with smaller phrase technology. Any transcriptional errors that result from this process are unintentional.  Fritzi Mandes M.D    [facility]   CC: Primary care physician; Venita Lick, NP

## 2022-08-23 DIAGNOSIS — F03918 Unspecified dementia, unspecified severity, with other behavioral disturbance: Secondary | ICD-10-CM | POA: Diagnosis not present

## 2022-08-23 NOTE — TOC Progression Note (Signed)
Transition of Care [facility]) - Progression Note    Patient Details  Name: Larry Buckley MRN: [id number] Date of Birth: [birth year]  Transition of Care [facility]) CM/SW Contact  Margarito Liner, LCSW Phone Number: 08/23/2022, 2:49 PM  Clinical Narrative:  Patient has a memory care bed offer at [facility] at [address], [place], [place] [address]. Sent secure email to DSS social workers to provide information. Will follow up with admissions once they have confirmed acceptance.   Expected Discharge Plan: Memory Care Barriers to Discharge: Other (must enter comment) (Facility will not accept patient back)  Expected Discharge Plan and Services Expected Discharge Plan: Memory Care   Discharge Planning Services: CM Consult   Living arrangements for the past 2 months: Group Home                 DME Arranged: N/A DME Agency: NA       HH Arranged: NA HH Agency: NA         Social Determinants of Health (SDOH) Interventions    Readmission Risk Interventions     No data to display

## 2022-08-23 NOTE — Progress Notes (Signed)
  PROGRESS NOTE    Larry Buckley  DEY:[id number] DOB: 03-23-1941 DOA: 07/09/2022 PCP: Marjie Skiff, NP  [id number]-AA  LOS: 44 days   Brief hospital course:   Assessment & Plan: Larry Buckley is a 81 y.o. male with medical history significant for dementia with behavioral disturbance, hypertension, hyperlipidemia, AAA with repair, gout, who initially presented to [facility] ED after aggressive and combative behavior at the group home.  He was brought into the ED on 07/01/2022 via EMS under IVC.  TOC team working on placement   Dementia with behavioral disturbance [facility]) --psych consulted --pt has been cooperative  --Continue Depakote, Remeron, Seroquel, Risperdal   Essential hypertension --cont amlodipine   Hematuria, resolved Urinary retention, resolved -Initial issues with retention requiring Foley catheter placement but due to blockage urology was consulted who placed Foley catheter was left in for a week and eventually removed.   --Continue Flomax   Protein-calorie malnutrition, severe --supplements per dietician   Failure to thrive in adult Complicates overall prognosis.  Seen by palliative care in the past.     Central sleep apnea Monitor   Hyperlipidemia - Statin    DVT prophylaxis: Lovenox SQ Code Status: Full code  Family Communication:  Level of care: Med-Surg Dispo:   The patient is from: group home Anticipated d/c is to: to be determined Anticipated d/c date is: whenever placement found   Subjective and Interval History:  Pt was awake, with mittens, said he was ready to eat his lunch.   Objective: Vitals:   08/22/22 2126 08/23/22 0550 08/23/22 0758 08/23/22 1446  BP: 132/66 132/66 (!) 140/63 115/60  Pulse: 83 64 97 69  Resp: 18 18 18 16   Temp: 98.5 F (36.9 C) 98.2 F (36.8 C) 97.8 F (36.6 C) 98.3 F (36.8 C)  TempSrc: Oral Oral Oral Oral  SpO2: 100% 99% 97% 100%  Weight:      Height:        Intake/Output Summary (Last 24 hours) at  08/23/2022 1624 Last data filed at 08/23/2022 1000 Gross per 24 hour  Intake --  Output 1650 ml  Net -1650 ml   Filed Weights   07/04/22 2230 07/06/22 1533  Weight: 84 kg 82 kg    Examination:   Constitutional: NAD, alert, not oriented, mittens on HEENT: conjunctivae and lids normal, EOMI CV: No cyanosis.   RESP: normal respiratory effort, on RA   Data Reviewed: I have personally reviewed labs and imaging studies  Time spent: 25 minutes  07/08/22, MD [facility] If 7PM-7AM, please contact night-coverage 08/23/2022, 4:24 PM

## 2022-08-24 DIAGNOSIS — F03918 Unspecified dementia, unspecified severity, with other behavioral disturbance: Secondary | ICD-10-CM | POA: Diagnosis not present

## 2022-08-24 NOTE — Progress Notes (Signed)
  PROGRESS NOTE    Larry Buckley  [id number]:[id number] DOB: 1941/01/29 DOA: 07/08/2022 PCP: Marjie Skiff, NP  [id number]-AA  LOS: 45 days   Brief hospital course:   Assessment & Plan: Larry Buckley is a 81 y.o. male with medical history significant for dementia with behavioral disturbance, hypertension, hyperlipidemia, AAA with repair, gout, who initially presented to [facility] ED after aggressive and combative behavior at the group home.  He was brought into the ED on 06/26/2022 via EMS under IVC.  TOC team working on placement   Dementia with behavioral disturbance [facility]) --psych consulted --pt has been cooperative  --Continue Depakote, Remeron, Seroquel, Risperdal   Essential hypertension --cont amlodipine   Hematuria, resolved Urinary retention, resolved -Initial issues with retention requiring Foley catheter placement but due to blockage urology was consulted who placed Foley catheter was left in for a week and eventually removed.   --Continue Flomax   Protein-calorie malnutrition, severe --supplements per dietician   Failure to thrive in adult Complicates overall prognosis.  Seen by palliative care in the past.     Central sleep apnea Monitor   Hyperlipidemia - Statin    DVT prophylaxis: Lovenox SQ Code Status: Full code  Family Communication:  Level of care: Med-Surg Dispo:   The patient is from: group home Anticipated d/c is to: to be determined Anticipated d/c date is: whenever placement found   Subjective and Interval History:  No acute event.     Objective: Vitals:   08/23/22 2000 08/24/22 0347 08/24/22 0758 08/24/22 1629  BP: 122/60 112/60 129/70 112/61  Pulse: 84 (!) 42 60 75  Resp: 16 20 14 16   Temp: 99.2 F (37.3 C) 98 F (36.7 C) 97.6 F (36.4 C) 98.9 F (37.2 C)  TempSrc: Oral Oral Oral Oral  SpO2: 100% 100% 99% 98%  Weight:      Height:        Intake/Output Summary (Last 24 hours) at 08/24/2022 1709 Last data filed at 08/24/2022  0714 Gross per 24 hour  Intake 240 ml  Output --  Net 240 ml   Filed Weights   07/04/22 2230 07/06/22 1533  Weight: 84 kg 82 kg    Examination:   Constitutional: NAD, not oriented CV: No cyanosis.   RESP: normal respiratory effort, on RA Extremities: No effusions, edema in BLE   Data Reviewed: I have personally reviewed labs and imaging studies  Time spent: 25 minutes  07/08/22, MD [facility] If 7PM-7AM, please contact night-coverage 08/24/2022, 5:09 PM

## 2022-08-24 NOTE — Progress Notes (Signed)
[facility] [employer] [facility]) Hospital Liaison note:  This is a current outpatient-based Palliative Care patient. [facility] is unable to follow this patient at [facility] since we do not cover [facility].  Please call with any outpatient palliative questions or concerns.  Thank you, Abran Cantor, LPN [facility] Liaison [phone]

## 2022-08-24 NOTE — TOC Progression Note (Signed)
Transition of Care [facility]) - Progression Note    Patient Details  Name: Larry Buckley MRN: [id number] Date of Birth: November 09, 1940  Transition of Care [facility]) CM/SW Contact  Margarito Liner, LCSW Phone Number: 08/24/2022, 3:52 PM  Clinical Narrative:  DSS has accepted bed offer from [facility]. Admissions is checking on bed availability and then CSW will check on ambulance transport availability.   Expected Discharge Plan: Memory Care Barriers to Discharge: Other (must enter comment) (Facility will not accept patient back)  Expected Discharge Plan and Services Expected Discharge Plan: Memory Care   Discharge Planning Services: CM Consult   Living arrangements for the past 2 months: Group Home                 DME Arranged: N/A DME Agency: NA       HH Arranged: NA HH Agency: NA         Social Determinants of Health (SDOH) Interventions    Readmission Risk Interventions     No data to display

## 2022-08-24 NOTE — Progress Notes (Signed)
End of shift note:   There were not any significant events during this shift. Schedule medications were given. VSS.

## 2022-08-25 DIAGNOSIS — F03918 Unspecified dementia, unspecified severity, with other behavioral disturbance: Secondary | ICD-10-CM | POA: Diagnosis not present

## 2022-08-25 MED ORDER — DIVALPROEX SODIUM 125 MG PO CSDR
500.0000 mg | DELAYED_RELEASE_CAPSULE | Freq: Two times a day (BID) | ORAL | Status: DC
  Administered 2022-08-25 – 2022-10-02 (×74): 500 mg via ORAL
  Filled 2022-08-25 (×77): qty 4

## 2022-08-25 NOTE — TOC Progression Note (Signed)
Transition of Care [facility]) - Progression Note    Patient Details  Name: DEMARKO ZEIMET MRN: [id number] Date of Birth: [birth year]  Transition of Care [facility]) CM/SW Contact  Margarito Liner, LCSW Phone Number: 08/25/2022, 1:57 PM  Clinical Narrative:  [name] will have a bed on Monday. Asked admissions to find out what time transport can be arranged for.    Expected Discharge Plan: Memory Care Barriers to Discharge: Other (must enter comment) (Facility will not accept patient back)  Expected Discharge Plan and Services Expected Discharge Plan: Memory Care   Discharge Planning Services: CM Consult   Living arrangements for the past 2 months: Group Home                 DME Arranged: N/A DME Agency: NA       HH Arranged: NA HH Agency: NA         Social Determinants of Health (SDOH) Interventions    Readmission Risk Interventions     No data to display

## 2022-08-25 NOTE — Plan of Care (Signed)
Patient is alert to self, slept through the morning and early afternoon. Patient able to feed self, takes meds whole. No c/o pain. Male condom cath in place, incontinent bowels. Ate minimal breakfast and was sleeping when lunch delivered tray left at bedside will attempt to set patient up to eat when he is awake. Problem: Education: Goal: Knowledge of General Education information will improve Description: Including pain rating scale, medication(s)/side effects and non-pharmacologic comfort measures Outcome: Not Applicable   Problem: Health Behavior/Discharge Planning: Goal: Ability to manage health-related needs will improve Outcome: Not Applicable   Problem: Clinical Measurements: Goal: Ability to maintain clinical measurements within normal limits will improve Outcome: Progressing Goal: Will remain free from infection Outcome: Progressing Goal: Diagnostic test results will improve Outcome: Progressing Goal: Respiratory complications will improve Outcome: Progressing Goal: Cardiovascular complication will be avoided Outcome: Progressing   Problem: Activity: Goal: Risk for activity intolerance will decrease Outcome: Not Progressing   Problem: Nutrition: Goal: Adequate nutrition will be maintained Outcome: Progressing   Problem: Coping: Goal: Level of anxiety will decrease Outcome: Progressing   Problem: Elimination: Goal: Will not experience complications related to bowel motility Outcome: Progressing Goal: Will not experience complications related to urinary retention Outcome: Progressing   Problem: Pain Managment: Goal: General experience of comfort will improve Outcome: Progressing   Problem: Safety: Goal: Ability to remain free from injury will improve Outcome: Progressing   Problem: Skin Integrity: Goal: Risk for impaired skin integrity will decrease Outcome: Progressing

## 2022-08-25 NOTE — Progress Notes (Signed)
Bedside report received  

## 2022-08-25 NOTE — Progress Notes (Signed)
  PROGRESS NOTE    Larry Buckley  [id number]:[id number] DOB: Dec 16, 1940 DOA: 06/26/2022 PCP: Marjie Skiff, NP  [id number]-AA  LOS: 46 days   Brief hospital course:   Assessment & Plan: Larry Buckley is a 81 y.o. male with medical history significant for dementia with behavioral disturbance, hypertension, hyperlipidemia, AAA with repair, gout, who initially presented to [facility] ED after aggressive and combative behavior at the group home.  He was brought into the ED on 07/02/2022 via EMS under IVC.  TOC team working on placement   Dementia with behavioral disturbance [facility]) --psych consulted --pt has been cooperative  --Continue Depakote, Remeron, Seroquel, Risperdal   Essential hypertension --cont amlodipine   Hematuria, resolved Urinary retention, resolved -Initial issues with retention requiring Foley catheter placement but due to blockage urology was consulted who placed Foley catheter was left in for a week and eventually removed.   --Continue Flomax   Protein-calorie malnutrition, severe --supplements per dietician   Failure to thrive in adult Complicates overall prognosis.  Seen by palliative care in the past.     Central sleep apnea Monitor   Hyperlipidemia - Statin    DVT prophylaxis: Lovenox SQ Code Status: Full code  Family Communication:  Level of care: Med-Surg Dispo:   The patient is from: group home Anticipated d/c is to: to be determined Anticipated d/c date is: whenever placement found   Subjective and Interval History:  No acute event.   Objective: Vitals:   08/24/22 2100 08/25/22 0540 08/25/22 0748 08/25/22 1516  BP: (!) 119/58 (!) 126/54 129/64 122/70  Pulse: 82 (!) 58 (!) 57 62  Resp: 18 16 18 19   Temp: 98.1 F (36.7 C) 97.7 F (36.5 C) 97.6 F (36.4 C) 97.9 F (36.6 C)  TempSrc: Oral Axillary Oral Oral  SpO2: 97% 100% 99% 99%  Weight:      Height:        Intake/Output Summary (Last 24 hours) at 08/25/2022 1654 Last data filed at  08/25/2022 0718 Gross per 24 hour  Intake 120 ml  Output 800 ml  Net -680 ml   Filed Weights   07/04/22 2230 07/06/22 1533  Weight: 84 kg 82 kg    Examination:   Constitutional: NAD CV: No cyanosis.   RESP: normal respiratory effort, on RA Extremities: No effusions, edema in BLE SKIN: warm, dry   Data Reviewed: I have personally reviewed labs and imaging studies  Time spent: 25 minutes  07/08/22, MD [facility] If 7PM-7AM, please contact night-coverage 08/25/2022, 4:54 PM

## 2022-08-26 DIAGNOSIS — F03918 Unspecified dementia, unspecified severity, with other behavioral disturbance: Secondary | ICD-10-CM | POA: Diagnosis not present

## 2022-08-26 NOTE — Progress Notes (Signed)
  PROGRESS NOTE    Larry Buckley  ZOX:[id number] DOB: 1941/03/15 DOA: 07/23/2022 PCP: Marjie Skiff, NP  [id number]  LOS: 47 days   Brief hospital course:   Assessment & Plan: Larry Buckley is a 81 y.o. male with medical history significant for dementia with behavioral disturbance, hypertension, hyperlipidemia, AAA with repair, gout, who initially presented to [facility] ED after aggressive and combative behavior at the group home.  He was brought into the ED on July 23, 2022 via EMS under IVC.  TOC team working on placement   Dementia with behavioral disturbance [facility]) --psych consulted --pt has been cooperative  --Continue Depakote, Remeron, Seroquel, Risperdal   Essential hypertension --cont amlodipine   Hematuria, resolved Urinary retention, resolved -Initial issues with retention requiring Foley catheter placement but due to blockage urology was consulted who placed Foley catheter was left in for a week and eventually removed.   --Continue Flomax   Protein-calorie malnutrition, severe --supplements per dietician   Failure to thrive in adult Complicates overall prognosis.  Seen by palliative care in the past.     Central sleep apnea Monitor   Hyperlipidemia - Statin    DVT prophylaxis: Lovenox SQ Code Status: Full code  Family Communication:  Level of care: Med-Surg Dispo:   The patient is from: group home Anticipated d/c is to: to be determined Anticipated d/c date is: whenever placement found   Subjective and Interval History:  Pt asked for a soda during rounds.     Objective: Vitals:   08/25/22 0748 08/25/22 1516 08/25/22 1954 08/26/22 0840  BP: 129/64 122/70 (!) 143/73 (!) 140/88  Pulse: (!) 57 62 67 72  Resp: 18 19 16 17   Temp: 97.6 F (36.4 C) 97.9 F (36.6 C) 98.7 F (37.1 C) 98.6 F (37 C)  TempSrc: Oral Oral Oral Oral  SpO2: 99% 99% 97%   Weight:      Height:        Intake/Output Summary (Last 24 hours) at 08/26/2022 1530 Last data  filed at 08/26/2022 0655 Gross per 24 hour  Intake --  Output 1700 ml  Net -1700 ml   Filed Weights   07/04/22 2230 07/06/22 1533  Weight: 84 kg 82 kg    Examination:   Constitutional: NAD, alert, not oriented, sitting up in bed HEENT: conjunctivae and lids normal, EOMI CV: No cyanosis.   RESP: normal respiratory effort, on RA Extremities: No effusions, edema in BLE SKIN: warm, dry Neuro: II - XII grossly intact.     Data Reviewed: I have personally reviewed labs and imaging studies  Time spent: 25 minutes  07/08/22, MD [facility] If 7PM-7AM, please contact night-coverage 08/26/2022, 3:30 PM

## 2022-08-26 NOTE — Progress Notes (Addendum)
87 - received report 2010 - obtain VS 2031 - scheduled medication administration, prn medication administered per physician's orders, assisted with drinking ensure 2212 - pt resting in bed with eyes closed, respirations even and unlabored, no obvious signs of distress or discomfort noted 2302 - pt continues to rest with eyes closed, even respirations noted, no obvious signs of distress or discomfort noted 0018 - restless in bed, denies needs 0205 - pt resting in bed, fluids offered and refused 0345 - pt resting in bed with eyes closed, respirations even and unlabored, no signs of distress noted 0453 - VS obtained 0555 - fluids offered, repositioned

## 2022-08-27 DIAGNOSIS — F03918 Unspecified dementia, unspecified severity, with other behavioral disturbance: Secondary | ICD-10-CM | POA: Diagnosis not present

## 2022-08-27 NOTE — Progress Notes (Signed)
  PROGRESS NOTE    Larry Buckley  [id number] DOB: May 07, 1941 DOA: 07/09/2022 PCP: Marjie Skiff, NP  [id number]  LOS: 48 days   Brief hospital course:   Assessment & Plan: Larry Buckley is a 81 y.o. male with medical history significant for dementia with behavioral disturbance, hypertension, hyperlipidemia, AAA with repair, gout, who initially presented to [facility] ED after aggressive and combative behavior at the group home.  He was brought into the ED on 06/19/2022 via EMS under IVC.  TOC team working on placement   Dementia with behavioral disturbance [facility]) --psych consulted --pt has been cooperative  --Continue Depakote, Remeron, Seroquel, Risperdal   Essential hypertension --cont amlodipine   Hematuria, resolved Urinary retention, resolved -Initial issues with retention requiring Foley catheter placement but due to blockage urology was consulted who placed Foley catheter was left in for a week and eventually removed.   --Continue Flomax   Protein-calorie malnutrition, severe --supplements per dietician   Failure to thrive in adult Complicates overall prognosis.  Seen by palliative care in the past.     Central sleep apnea Monitor   Hyperlipidemia - Statin    DVT prophylaxis: Lovenox SQ Code Status: Full code  Family Communication:  Level of care: Med-Surg Dispo:   The patient is from: group home Anticipated d/c is to: to be determined Anticipated d/c date is: whenever placement found   Subjective and Interval History:  Pt asked for water today.   Objective: Vitals:   08/26/22 0840 08/26/22 2009 08/27/22 0453 08/27/22 0729  BP: (!) 140/88 135/67 130/80 (!) 135/55  Pulse: 72 79 82 73  Resp: 17 16 16 12   Temp: 98.6 F (37 C) 98.4 F (36.9 C) 98.9 F (37.2 C) 97.6 F (36.4 C)  TempSrc: Oral Oral Axillary Oral  SpO2:    100%  Weight:      Height:        Intake/Output Summary (Last 24 hours) at 08/27/2022 1647 Last data filed at 08/27/2022  0555 Gross per 24 hour  Intake 720 ml  Output --  Net 720 ml   Filed Weights   07/04/22 2230 07/06/22 1533  Weight: 84 kg 82 kg    Examination:   Constitutional: NAD, alert, not oriented HEENT: conjunctivae and lids normal, EOMI CV: No cyanosis.   RESP: normal respiratory effort, on RA SKIN: warm, dry Neuro: II - XII grossly intact.     Data Reviewed: I have personally reviewed labs and imaging studies  Time spent: 25 minutes  07/08/22, MD [facility] If 7PM-7AM, please contact night-coverage 08/27/2022, 4:47 PM

## 2022-08-28 DIAGNOSIS — F03918 Unspecified dementia, unspecified severity, with other behavioral disturbance: Secondary | ICD-10-CM | POA: Diagnosis not present

## 2022-08-28 NOTE — Progress Notes (Signed)
  PROGRESS NOTE    Larry Buckley  [id number] DOB: 1941/05/24 DOA: 07/03/2022 PCP: Marjie Skiff, NP  [id number]  LOS: 49 days   Brief hospital course:   Assessment & Plan: Larry Buckley is a 81 y.o. male with medical history significant for dementia with behavioral disturbance, hypertension, hyperlipidemia, AAA with repair, gout, who initially presented to [facility] ED after aggressive and combative behavior at the group home.  He was brought into the ED on 07/09/2022 via EMS under IVC.  TOC team working on placement   Dementia with behavioral disturbance [facility]) --psych consulted --pt has been cooperative  --Continue Depakote, Remeron, Seroquel, Risperdal   Essential hypertension --cont amlodipine   Hematuria, resolved Urinary retention, resolved -Initial issues with retention requiring Foley catheter placement but due to blockage urology was consulted who placed Foley catheter was left in for a week and eventually removed.   --Continue Flomax   Protein-calorie malnutrition, severe --supplements per dietician   Failure to thrive in adult Complicates overall prognosis.  Seen by palliative care in the past.     Central sleep apnea Monitor   Hyperlipidemia - Statin    DVT prophylaxis: Lovenox SQ Code Status: Full code  Family Communication:  Level of care: Med-Surg Dispo:   The patient is from: group home Anticipated d/c is to: to be determined Anticipated d/c date is: whenever placement found   Subjective and Interval History:  No change today.   Objective: Vitals:   08/27/22 1946 08/28/22 0655 08/28/22 0755 08/28/22 1605  BP: 135/88 (!) 103/56 (!) 125/57 137/79  Pulse: 70 (!) 54 99 69  Resp: 16 16 12 16   Temp: 98.3 F (36.8 C) (!) 97.5 F (36.4 C) (!) 97.4 F (36.3 C) 97.7 F (36.5 C)  TempSrc: Oral Oral Oral   SpO2: 100% 100% 100% 100%  Weight:      Height:        Intake/Output Summary (Last 24 hours) at 08/28/2022 1738 Last data filed at  08/28/2022 0657 Gross per 24 hour  Intake --  Output 400 ml  Net -400 ml   Filed Weights   07/04/22 2230 07/06/22 1533  Weight: 84 kg 82 kg    Examination:   Constitutional: NAD CV: No cyanosis.   RESP: normal respiratory effort, on RA Extremities: No effusions, edema in BLE   Data Reviewed: I have personally reviewed labs and imaging studies  Time spent: 25 minutes  07/08/22, MD [facility] If 7PM-7AM, please contact night-coverage 08/28/2022, 5:38 PM

## 2022-08-29 DIAGNOSIS — F03918 Unspecified dementia, unspecified severity, with other behavioral disturbance: Secondary | ICD-10-CM | POA: Diagnosis not present

## 2022-08-29 NOTE — Progress Notes (Signed)
Nutrition Follow Up Note   DOCUMENTATION CODES:   Severe malnutrition in context of social or environmental circumstances  INTERVENTION:   Ensure Enlive po TID, each supplement provides 350 kcal and 20 grams of protein.  Magic cup TID with meals, each supplement provides 290 kcal and 9 grams of protein  MVI po daily   Dysphagia 3 diet   Assist with meals   NUTRITION DIAGNOSIS:   Severe Malnutrition related to social / environmental circumstances as evidenced by severe fat depletion, severe muscle depletion, 8 percent weight loss in 2 months.  GOAL:   Patient will meet greater than or equal to 90% of their needs -progressing   MONITOR:   PO intake, Supplement acceptance, Labs, Weight trends, Skin, I & O's  ASSESSMENT:   81 y.o. male with medical history significant for dementia with behavioral disturbance, hypertension, hyperlipidemia, AAA with repair and gout who is admitted after aggressive and combative behavior at the group home.  Pt with inconsistent appetite and oral intake; pt eating anywhere from 0-100% of meals but does better with meal assistance. Pt is being offered Ensure and Magic Cups. Pt remains on appetite stimulant. Pt and family have declined feeding tube. No weight since 9/21; pt is ordered for weekly weights but none documented. Pt is currently pending placement to memory care unit. RD will sign off secondary to medical stability. If any further nutrition concerns are identified, please re-consult RD.   Medications reviewed and include: lovenox, melatonin, remeron, MVI, miralax, senokot  Labs reviewed: none recent   Diet Order:   Diet Order             DIET DYS 3 Room service appropriate? No; Fluid consistency: Thin  Diet effective now                  EDUCATION NEEDS:   No education needs have been identified at this time  Skin:  Skin Assessment: Reviewed RN Assessment  Last BM:  11/14- type 6  Height:   Ht Readings from Last 1  Encounters:  08/09/22 6\' 2"  (1.88 m)    Weight:   Wt Readings from Last 1 Encounters:  07/06/22 82 kg    Ideal Body Weight:  86.3 kg  BMI:  Body mass index is 23.21 kg/m.  Estimated Nutritional Needs:   Kcal:  2100-2400kcal/day  Protein:  105-120g/day  Fluid:  2.1-2.4L/day  07/08/22 MS, RD, LDN Please refer to [facility] for RD and/or RD on-call/weekend/after hours pager

## 2022-08-29 NOTE — TOC Progression Note (Signed)
Transition of Care [facility]) - Progression Note    Patient Details  Name: Larry Buckley MRN: [id number] Date of Birth: 1941-05-19  [facility]) CM/SW Contact  Margarito Liner, LCSW Phone Number: 08/29/2022, 12:58 PM  Clinical Narrative:   No LTC bed available at [facility] at this time. Updated DSS and TOC leadership team.  Expected Discharge Plan: Memory Care Barriers to Discharge: Other (must enter comment) (Facility will not accept patient back)  Expected Discharge Plan and Services Expected Discharge Plan: Memory Care   Discharge Planning Services: CM Consult   Living arrangements for the past 2 months: Group Home                 DME Arranged: N/A DME Agency: NA       HH Arranged: NA HH Agency: NA         Social Determinants of Health (SDOH) Interventions    Readmission Risk Interventions     No data to display

## 2022-08-29 NOTE — Progress Notes (Signed)
  PROGRESS NOTE    Larry Buckley  KZS:[id number] DOB: [birth year] DOA: 06/16/2022 PCP: Marjie Skiff, NP  [id number]  LOS: 50 days   Brief hospital course:   Assessment & Plan: Larry Buckley is a 81 y.o. male with medical history significant for dementia with behavioral disturbance, hypertension, hyperlipidemia, AAA with repair, gout, who initially presented to [facility] ED after aggressive and combative behavior at the group home.  He was brought into the ED on 07/05/2022 via EMS under IVC.  TOC team working on placement   Dementia with behavioral disturbance [facility]) --psych consulted --pt has been cooperative  --Continue Depakote, Remeron, Seroquel, Risperdal   Essential hypertension --cont amlodipine   Hematuria, resolved Urinary retention, resolved -Initial issues with retention requiring Foley catheter placement but due to blockage urology was consulted who placed Foley catheter was left in for a week and eventually removed.   --Continue Flomax   Protein-calorie malnutrition, severe --supplements per dietician   Failure to thrive in adult Complicates overall prognosis.  Seen by palliative care in the past.     Central sleep apnea Monitor   Hyperlipidemia - Statin    DVT prophylaxis: Lovenox SQ Code Status: Full code  Family Communication:  Level of care: Med-Surg Dispo:   The patient is from: group home Anticipated d/c is to: to be determined Anticipated d/c date is: whenever placement found   Subjective and Interval History:  No change today.   Objective: Vitals:   08/28/22 1946 08/29/22 0414 08/29/22 0814 08/29/22 1610  BP: (!) 142/70 (!) 154/70 (!) 135/97 129/67  Pulse: 90 88 92 62  Resp: 20 20 16 16   Temp: 98.7 F (37.1 C) 98.3 F (36.8 C) 97.8 F (36.6 C) 97.9 F (36.6 C)  TempSrc: Oral Oral  Oral  SpO2: 100% 95% 99% 100%  Weight:      Height:        Intake/Output Summary (Last 24 hours) at 08/29/2022 1745 Last data filed at 08/29/2022  0538 Gross per 24 hour  Intake 480 ml  Output 600 ml  Net -120 ml   Filed Weights   07/04/22 2230 07/06/22 1533  Weight: 84 kg 82 kg    Examination:   Constitutional: NAD CV: No cyanosis.   RESP: normal respiratory effort, on RA Extremities: No effusions, edema in BLE SKIN: warm, dry   Data Reviewed: I have personally reviewed labs and imaging studies  Time spent: 25 minutes  07/08/22, MD [facility] If 7PM-7AM, please contact night-coverage 08/29/2022, 5:45 PM

## 2022-08-30 DIAGNOSIS — I10 Essential (primary) hypertension: Secondary | ICD-10-CM | POA: Diagnosis not present

## 2022-08-30 DIAGNOSIS — R627 Adult failure to thrive: Secondary | ICD-10-CM | POA: Diagnosis not present

## 2022-08-30 DIAGNOSIS — F03918 Unspecified dementia, unspecified severity, with other behavioral disturbance: Secondary | ICD-10-CM | POA: Diagnosis not present

## 2022-08-30 NOTE — Progress Notes (Signed)
Progress Note   Patient: Larry Buckley ERX:[id number] DOB: 08/22/1941 DOA: Jul 20, 2022     51 DOS: the patient was seen and examined on 08/30/2022   Brief hospital course: [name] is a 81 y.o. male with medical history significant for dementia with behavioral disturbance, hypertension, hyperlipidemia, AAA with repair, gout, who initially presented to [facility] ED after aggressive and combative behavior at the group home.  He was brought into the ED on July 20, 2022 via EMS under IVC.  He has been staying in the ED, seen by psychiatry and social worker, awaiting placement.  Admitted to hospitalist service on 9/19 as patient has increased weakness. Patient has significant agitation, barely eating.  Perative care consult obtained, currently working with DSS for goal of care.   9/27: Was agitated earlier required Geodon.  9/28: TOC working on placement 9/29: Required one-time Geodon this afternoon for agitation.  Psychiatry following and increased his dose of Risperdal and Seroquel today 9/30: Per nursing note patient was combative, agitated and attempting to get out of the bed last night - sleepy this morning 10/1: Urology consulted for hematuria and change of Foley catheter 10/2-10/3: Waiting for placement. 10/4: Patient remained stable.  Waiting for placement.  Patient need LTC placement.  DSS is involved. Palliative care will follow-up as an outpatient. 10/5: Remains stable.  Still waiting for placement. 10/6: No change today.No news on placement. 10/8: Remains stable.  Still waiting for placement 10/11: Patient remained stable.  [place] [facility] evaluated him, if we can remove Foley catheter.  On chart review could not find a reason for Foley catheter except urology note on 10/1 stating that keep Foley for 1 week for concern of falls posterior passage created during an attempt to replace Foley catheter when he accidentally pulled it out causing hematuria.  It has been more than 1 week, we will  remove Foley and give him a voiding trial. Also added Flomax.  10/13: Foley was removed and patient is voiding well.  We will check postvoid bladder scan to rule out any abnormal retention. [place] evaluated him, pending formal assessment note and acceptance.  No other acute concerns.  10/14; still no definitive answers for disposition.  No other concern  Assessment and Plan:  Dementia with behavioral disturbance (HCC) Continue Depakote, Remeron, Seroquel, Risperdal  Patient condition has been stabilized, currently pending nursing home placement.   Essential hypertension Blood pressures controlled on amlodipine.   Hematuria, resolved Urinary retention, resolved Patient has resolved, continue Flomax.   Protein-calorie malnutrition, severe Failure to thrive in adult Continue protein supplements.   Central sleep apnea Monitor   Hyperlipidemia  Statin  Discharge planning. Peer to peer review performed with the insurance company physician, they request PT/OT reeval and submit in 2 hours. They will make determination at that time if patient is approved for nursing placement.     Subjective:  Patient is confused, but no complaints.  Physical Exam: Vitals:   08/29/22 0814 08/29/22 1610 08/29/22 2022 08/30/22 0508  BP: (!) 135/97 129/67 124/75 (!) 116/59  Pulse: 92 62 (!) 109 (!) 57  Resp: 16 16 18 18   Temp: 97.8 F (36.6 C) 97.9 F (36.6 C) 99.2 F (37.3 C) 97.7 F (36.5 C)  TempSrc:  Oral Oral Oral  SpO2: 99% 100% 90% 100%  Weight:      Height:       General exam: Appears calm and comfortable  Respiratory system: Clear to auscultation. Respiratory effort normal. Cardiovascular system: S1 & S2 heard,  RRR. No JVD, murmurs, rubs, gallops or clicks. No pedal edema. Gastrointestinal system: Abdomen is nondistended, soft and nontender. No organomegaly or masses felt. Normal bowel sounds heard. Central nervous system: Alert and oriented x1. No focal neurological  deficits. Extremities: Symmetric 5 x 5 power. Skin: No rashes, lesions or ulcers    Data Reviewed:  Reviewed recent lab results.  Family Communication: None  Disposition: Status is: Inpatient Remains inpatient appropriate because: Unsafe discharge.  Planned Discharge Destination: Skilled nursing facility    Time spent: 50 minutes  Author: Marrion Coy, MD 08/30/2022 11:49 AM  For on call review [email or web address].

## 2022-08-30 NOTE — TOC Progression Note (Addendum)
Transition of Care [facility]) - Progression Note    Patient Details  Name: Larry Buckley MRN: [id number] Date of Birth: [birth year]  Transition [facility] CM/SW Contact  Margarito Liner, LCSW Phone Number: 08/30/2022, 9:59 AM  Clinical Narrative:  SNF started insurance authorization. [employer] called requesting peer-to-peer review. Sent details to MD. Deadline is 2:00 pm today.   2:02 pm: Uploaded today's PT note into [employer] portal. Per RN, son has arrived to room and said no one told him he was int he hospital. Relayed information to DSS.  Expected Discharge Plan: Memory Care Barriers to Discharge: Other (must enter comment) (Facility will not accept patient back)  Expected Discharge Plan and Services Expected Discharge Plan: Memory Care   Discharge Planning Services: CM Consult   Living arrangements for the past 2 months: Group Home                 DME Arranged: N/A DME Agency: NA       HH Arranged: NA HH Agency: NA         Social Determinants of Health (SDOH) Interventions    Readmission Risk Interventions     No data to display

## 2022-08-30 NOTE — Evaluation (Signed)
Physical Therapy Evaluation Patient Details Name: Larry Buckley MRN: [id number] DOB: [birth year] Today's Date: 08/30/2022  History of Present Illness  Pt admitted for dementia with behavior disturbance (appears improved since admission). Pt from family care home and is poor historian.  Clinical Impression  Asked to re-evaluate patient per request of attending physician.  Patient sleeping soundly upon arrival to room; safey mittens in place.  Patient awakens to voice, light touch; oriented to self only once awakened.  Calm and cooperative throughout session, but limited ability to purposely follow commands, often requiring hand-over-hand for initiation and sequencing of all functional tasks.  No clinical indicators of pain appreciated during session.  Patient generally deconditioned due to prolonged hospitalization and limited active participation in mobility tasks to date; however, no focal weakness appreciated.  Currently requiring mod/max assist for bed mobility; min assist for unsupported sitting balance; max assist +2 for partial sit/stand and standing balance efforts.  Demonstrates total assist for LE/foot placement and lift off from bed surface (hand-over-hand on RW); unable to achieve full, upright posture with very heavy posterior bias. Absent awareness and/or ability to self-correct. Unsafe to attempt with less than heavy +2; anticipate need for mechanical lift equipment to safely transfer patient with any/all OOB efforts.  Anticipate total/dependent care required for all self-care and mobility needs at discharge (hospital bed, hoyer lift, BSC). Given fluctuation in mobility performance and improvement in overall behavior throughout admission, will trial re-introduction of PT services, but anticipate current status may be new functional baseline for patient. Thoughout trial, will closely monitor ability to actively participate/progress.  Anticipate cognition and ability to carry-over/integrate  skilled intervention will be very limited due to baseline dementia (as already demonstrated during this hospital stay).  Anticipate need for LTC for overall care needs outside of hospital environment.       Recommendations for follow up therapy are one component of a multi-disciplinary discharge planning process, led by the attending physician.  Recommendations may be updated based on patient status, additional functional criteria and insurance authorization.  Follow Up Recommendations Follow physician's recommendations for discharge plan and follow up therapies (LTC to manage care outside of hospital environment) Can patient physically be transported by private vehicle: No    Assistance Recommended at Discharge Frequent or constant Supervision/Assistance  Patient can return home with the following  Two people to help with walking and/or transfers;Two people to help with bathing/dressing/bathroom    Equipment Recommendations    Recommendations for Other Services       Functional Status Assessment Patient has had a recent decline in their functional status and/or demonstrates limited ability to make significant improvements in function in a reasonable and predictable amount of time     Precautions / Restrictions Precautions Precautions: Fall Restrictions Weight Bearing Restrictions: No      Mobility  Bed Mobility Overal bed mobility: Needs Assistance Bed Mobility: Supine to Sit, Sit to Supine     Supine to sit: Max assist, Total assist Sit to supine: Mod assist, Max assist   General bed mobility comments: total assist +2 for scooting up in bed    Transfers Overall transfer level: Needs assistance Equipment used: Rolling walker (2 wheels) Transfers: Sit to/from Stand Sit to Stand: Max assist, +2 physical assistance           General transfer comment: total assist for LE/foot placement and lift off from bed surface (hand-over-hand on RW); unable to achieve full, upright  posture with very heavy posterior bias.  Absent awareness and/or  ability to self-correct.  Unsafe to attempt with less than heavy +2; anticipate need for mechanical lift equipment to safely transfer patient with any/all OOB efforts.    Ambulation/Gait               General Gait Details: unsafe/unable  Stairs            Wheelchair Mobility    Modified Rankin (Stroke Patients Only)       Balance Overall balance assessment: Needs assistance Sitting-balance support: No upper extremity supported, Feet supported Sitting balance-Leahy Scale: Fair Sitting balance - Comments: maintains with min assist   Standing balance support: Bilateral upper extremity supported Standing balance-Leahy Scale: Zero Standing balance comment: very heavy posterior bias, absent awareness, absent righting reactions                             Pertinent Vitals/Pain Pain Assessment Pain Assessment: Faces Faces Pain Scale: No hurt    Home Living Family/patient expects to be discharged to:: Unsure                   Additional Comments: pt is poor historian, per chart pt from family care home, however are unwilling to let him return due to agitation    Prior Function               Mobility Comments: Patient poor historian and unable to provide history.  Per chart, functional status fluctutes based on cognitive status and ability/willingness to participate.       Hand Dominance        Extremity/Trunk Assessment   Upper Extremity Assessment Upper Extremity Assessment: Generalized weakness    Lower Extremity Assessment Lower Extremity Assessment: Generalized weakness (grossly 3-/5; unable to formally participate with isolated MMT)       Communication   Communication:  (speech intermittently garbled, non-sensical at times; difficulty following commands and articulating needs)  Cognition Arousal/Alertness: Awake/alert Behavior During Therapy: Flat  affect Overall Cognitive Status: No family/caregiver present to determine baseline cognitive functioning                                 General Comments: Inconsistent ability to follow even simple commands; requiring hand-over-hand assist for initiation of all functional tasks        General Comments      Exercises Other Exercises Other Exercises: Positioned in midline, upright in bed and set up with meal tray.  Patient using R UE to feed self; fair recognition of items on tray, fair accuracy with hand-to-mouth.  CNA left with patient at bedside to assist with completion of meal and to replace safety mittens once meal complete.   Assessment/Plan    PT Assessment All further PT needs can be met in the next venue of care (will plan for trial as cognition allows)  PT Problem List         PT Treatment Interventions DME instruction;Gait training;Therapeutic exercise    PT Goals (Current goals can be found in the Care Plan section)  Acute Rehab PT Goals Patient Stated Goal: unable to state PT Goal Formulation: Patient unable to participate in goal setting Time For Goal Achievement: 09/13/22 Potential to Achieve Goals: Poor    Frequency Min 2X/week     Co-evaluation               AM-PAC PT "6 Clicks" Mobility  Outcome  Measure Help needed turning from your back to your side while in a flat bed without using bedrails?: A Lot Help needed moving from lying on your back to sitting on the side of a flat bed without using bedrails?: A Lot Help needed moving to and from a bed to a chair (including a wheelchair)?: Total Help needed standing up from a chair using your arms (e.g., wheelchair or bedside chair)?: Total Help needed to walk in hospital room?: Total Help needed climbing 3-5 steps with a railing? : Total 6 Click Score: 8    End of Session Equipment Utilized During Treatment: Gait belt   Patient left: in bed;with call bell/phone within reach;with  family/visitor present;with nursing/sitter in room Nurse Communication: Mobility status PT Visit Diagnosis: Unsteadiness on feet (R26.81);Muscle weakness (generalized) (M62.81);Difficulty in walking, not elsewhere classified (R26.2)    Time: 0569-7948 PT Time Calculation (min) (ACUTE ONLY): 21 min   Charges:   PT Evaluation $PT Re-evaluation: 1 Re-eval          Hadlea Furuya H. Owens Shark, PT, DPT, NCS 08/30/22, 1:55 PM [phone]

## 2022-08-30 NOTE — Plan of Care (Signed)

## 2022-08-31 DIAGNOSIS — N3000 Acute cystitis without hematuria: Secondary | ICD-10-CM | POA: Diagnosis not present

## 2022-08-31 DIAGNOSIS — N390 Urinary tract infection, site not specified: Secondary | ICD-10-CM | POA: Diagnosis present

## 2022-08-31 DIAGNOSIS — F03918 Unspecified dementia, unspecified severity, with other behavioral disturbance: Secondary | ICD-10-CM | POA: Diagnosis not present

## 2022-08-31 DIAGNOSIS — R627 Adult failure to thrive: Secondary | ICD-10-CM | POA: Diagnosis not present

## 2022-08-31 LAB — URINALYSIS, ROUTINE W REFLEX MICROSCOPIC
Bilirubin Urine: NEGATIVE
Glucose, UA: NEGATIVE mg/dL
Hgb urine dipstick: NEGATIVE
Ketones, ur: NEGATIVE mg/dL
Nitrite: NEGATIVE
Protein, ur: 30 mg/dL — AB
Specific Gravity, Urine: 1.021 (ref 1.005–1.030)
Squamous Epithelial / HPF: NONE SEEN (ref 0–5)
WBC, UA: >50 WBC/hpf — ABNORMAL HIGH (ref 0–5)
pH: 6 (ref 5.0–8.0)

## 2022-08-31 MED ORDER — CEPHALEXIN 500 MG PO CAPS
500.0000 mg | ORAL_CAPSULE | Freq: Four times a day (QID) | ORAL | Status: DC
  Administered 2022-08-31: 500 mg via ORAL
  Filled 2022-08-31: qty 1

## 2022-08-31 MED ORDER — CEPHALEXIN 500 MG PO CAPS
500.0000 mg | ORAL_CAPSULE | Freq: Two times a day (BID) | ORAL | Status: AC
  Administered 2022-08-31 – 2022-09-05 (×10): 500 mg via ORAL
  Filled 2022-08-31 (×10): qty 1

## 2022-08-31 NOTE — Progress Notes (Signed)
Progress Note   Patient: Larry Buckley ZOX:[id number] DOB: July 21, 1941 DOA: 07/02/2022     52 DOS: the patient was seen and examined on 08/31/2022   Brief hospital course: JESAIAH FABIANO is a 81 y.o. male with medical history significant for dementia with behavioral disturbance, hypertension, hyperlipidemia, AAA with repair, gout, who initially presented to [facility] ED after aggressive and combative behavior at the group home.  He was brought into the ED on 07/01/2022 via EMS under IVC.  He has been staying in the ED, seen by psychiatry and social worker, awaiting placement.  Admitted to hospitalist service on 9/19 as patient has increased weakness. Patient has significant agitation, barely eating.  Perative care consult obtained, currently working with DSS for goal of care.   9/27: Was agitated earlier required Geodon.  9/28: TOC working on placement 9/29: Required one-time Geodon this afternoon for agitation.  Psychiatry following and increased his dose of Risperdal and Seroquel today 9/30: Per nursing note patient was combative, agitated and attempting to get out of the bed last night - sleepy this morning 10/1: Urology consulted for hematuria and change of Foley catheter 10/2-10/3: Waiting for placement. 10/4: Patient remained stable.  Waiting for placement.  Patient need LTC placement.  DSS is involved. Palliative care will follow-up as an outpatient. 10/5: Remains stable.  Still waiting for placement. 10/6: No change today.No news on placement. 10/8: Remains stable.  Still waiting for placement 10/11: Patient remained stable.  [place] [facility] evaluated him, if we can remove Foley catheter.  On chart review could not find a reason for Foley catheter except urology note on 10/1 stating that keep Foley for 1 week for concern of falls posterior passage created during an attempt to replace Foley catheter when he accidentally pulled it out causing hematuria.  It has been more than 1 week, we will  remove Foley and give him a voiding trial. Also added Flomax.  10/13: Foley was removed and patient is voiding well.  We will check postvoid bladder scan to rule out any abnormal retention. [place] [facility] evaluated him, pending formal assessment note and acceptance.  No other acute concerns.  10/14; still no definitive answers for disposition.  No other concern  Assessment and Plan: Dementia with behavioral disturbance (HCC) Continue Depakote, Remeron, Seroquel, Risperdal   UTI. Patient had an increased confusion since yesterday, UA suggest UTI.  Will treat with Keflex for 5 days.   Essential hypertension Blood pressures controlled on amlodipine.   Hematuria, resolved Urinary retention, resolved Patient has resolved, continue Flomax.   Protein-calorie malnutrition, severe Failure to thrive in adult Continue protein supplements.   Central sleep apnea Monitor   Hyperlipidemia  Statin  Discharge planning. Patient has been seen by PT again yesterday after a peer to peer review with insurance company.  After submission of PT note, insurance company has denied SNF placement.  Discussed with TOC, looking for long-term care facility with self-pay.     Subjective:  Patient has more confusion today than normal.   Physical Exam: Vitals:   08/30/22 1611 08/30/22 1928 08/31/22 0335 08/31/22 0825  BP: 131/61 138/67 136/65 135/73  Pulse: 76 77 76 69  Resp: 18 16 16 18   Temp: 97.9 F (36.6 C) 98.3 F (36.8 C) 98.5 F (36.9 C) 99 F (37.2 C)  TempSrc:  Oral Oral Oral  SpO2: 100% 97% 98% 100%  Weight:      Height:       General exam: Appears calm and comfortable  Respiratory system: Clear to auscultation. Respiratory effort normal. Cardiovascular system: S1 & S2 heard, RRR. No JVD, murmurs, rubs, gallops or clicks. No pedal edema. Gastrointestinal system: Abdomen is nondistended, soft and nontender. No organomegaly or masses felt. Normal bowel sounds heard. Central nervous  system: Alert and oriented x1. No focal neurological deficits. Extremities: Symmetric 5 x 5 power. Skin: No rashes, lesions or ulcers   Data Reviewed:  Lab results reviewed.  Family Communication:   Disposition: Status is: Inpatient Remains inpatient appropriate because: Unsafe for discharge.  Planned Discharge Destination:  Long-term care facility.    Time spent: 35 minutes  Author: Marrion Coy, MD 08/31/2022 10:48 AM  For on call review [email or web address].

## 2022-08-31 NOTE — TOC Progression Note (Addendum)
Transition of Care [facility]) - Progression Note    Patient Details  Name: Larry Buckley MRN: [id number] Date of Birth: 12-17-1940  Transition of Care [facility]) CM/SW Contact  Margarito Liner, LCSW Phone Number: 08/31/2022, 9:40 AM  Clinical Narrative:  SNF auth denied. LTC bed still unavailable.  Expected Discharge Plan: Memory Care Barriers to Discharge: Other (must enter comment) (Facility will not accept patient back)  Expected Discharge Plan and Services Expected Discharge Plan: Memory Care   Discharge Planning Services: CM Consult   Living arrangements for the past 2 months: Group Home                 DME Arranged: N/A DME Agency: NA       HH Arranged: NA HH Agency: NA         Social Determinants of Health (SDOH) Interventions    Readmission Risk Interventions     No data to display

## 2022-08-31 NOTE — Evaluation (Signed)
Occupational Therapy Re-Evaluation Patient Details Name: Larry Buckley MRN: [id number] DOB: 05/12/1941 Today's Date: 08/31/2022   History of Present Illness Pt admitted for dementia with behavior disturbance (appears improved since admission). Pt from family care home and is poor historian.   Clinical Impression   Per MD request pt re-evaluated this date.  Pt presents to acute OT demonstrating near baseline impaired ADL performance and functional mobility, limited by cognition. Pt currently requires MOD A bed mobility, good sitting balance. Pt reports need to urinate and motivated to trial standing - with SBA pt clears rear into half standing on mulitple (>10) attempts however unable to achieve fully upright posture even with MAX A. Pt unable to follow commands for safe RW use. Pt requires SETUP + SUPERVISION self-drinking and face washing seated EOB - responds to presentation of object. Anticipate dependent care required for ADLs and mobility needs at discharge. Pt noted to have fluctuating cognition/participation throughout admission, suspect pt is at new functional baseline. Will trial x3 sessions of OT. Recommend LTC upon hospital d/c.    Recommendations for follow up therapy are one component of a multi-disciplinary discharge planning process, led by the attending physician.  Recommendations may be updated based on patient status, additional functional criteria and insurance authorization.   Follow Up Recommendations  Long-term institutional care without follow-up therapy     Assistance Recommended at Discharge Frequent or constant Supervision/Assistance  Patient can return home with the following A lot of help with walking and/or transfers;Assist for transportation;Direct supervision/assist for medications management;Direct supervision/assist for financial management;Assistance with cooking/housework;A lot of help with bathing/dressing/bathroom    Functional Status Assessment  Patient  has had a recent decline in their functional status and/or demonstrates limited ability to make significant improvements in function in a reasonable and predictable amount of time  Equipment Recommendations  Other (comment) (lift, hospital bed)    Recommendations for Other Services       Precautions / Restrictions Precautions Precautions: Fall Restrictions Weight Bearing Restrictions: No      Mobility Bed Mobility Overal bed mobility: Needs Assistance Bed Mobility: Supine to Sit, Sit to Supine     Supine to sit: Mod assist Sit to supine: Mod assist        Transfers Overall transfer level: Needs assistance Equipment used: Rolling walker (2 wheels) Transfers: Sit to/from Stand Sit to Stand: Max assist           General transfer comment: clears rear into half standing on mulitple (>10) attempts with SBA. UNable to achieve fully upright posture with MAX A.      Balance Overall balance assessment: Needs assistance Sitting-balance support: No upper extremity supported, Feet supported Sitting balance-Leahy Scale: Fair     Standing balance support: Bilateral upper extremity supported Standing balance-Leahy Scale: Poor                             ADL either performed or assessed with clinical judgement   ADL Overall ADL's : Needs assistance/impaired                                       General ADL Comments: SETUP + SUPERVISION self-drinking and face washing seated EOB - responds to presentation of object.      Pertinent Vitals/Pain Pain Assessment Pain Assessment: No/denies pain     Hand Dominance  Extremity/Trunk Assessment Upper Extremity Assessment Upper Extremity Assessment: Overall WFL for tasks assessed   Lower Extremity Assessment Lower Extremity Assessment: Generalized weakness       Communication Communication Communication: No difficulties   Cognition Arousal/Alertness: Awake/alert Behavior During Therapy:  Flat affect Overall Cognitive Status: No family/caregiver present to determine baseline cognitive functioning                                        Home Living Family/patient expects to be discharged to:: Unsure                                 Additional Comments: pt is poor historian, per chart pt from family care home, however are unwilling to let him return due to agitation      Prior Functioning/Environment               Mobility Comments: Patient poor historian and unable to provide history.  Per chart, functional status fluctutes based on cognitive status and ability/willingness to participate.          OT Problem List: Decreased activity tolerance;Decreased safety awareness;Impaired balance (sitting and/or standing)      OT Treatment/Interventions: Self-care/ADL training;Therapeutic exercise;Energy conservation;DME and/or AE instruction;Therapeutic activities;Patient/family education;Balance training    OT Goals(Current goals can be found in the care plan section) Acute Rehab OT Goals Patient Stated Goal: none stated OT Goal Formulation: Patient unable to participate in goal setting Time For Goal Achievement: 09/14/22 Potential to Achieve Goals: Poor ADL Goals Pt Will Perform Grooming: with set-up;with supervision;standing Pt Will Perform Lower Body Dressing: with min assist;sit to/from stand Pt Will Transfer to Toilet: with min assist;ambulating;bedside commode  OT Frequency: Min 1X/week    Co-evaluation              AM-PAC OT "6 Clicks" Daily Activity     Outcome Measure Help from another person eating meals?: A Little Help from another person taking care of personal grooming?: A Little Help from another person toileting, which includes using toliet, bedpan, or urinal?: A Lot Help from another person bathing (including washing, rinsing, drying)?: A Lot Help from another person to put on and taking off regular upper body  clothing?: A Little Help from another person to put on and taking off regular lower body clothing?: A Lot 6 Click Score: 15   End of Session Nurse Communication: Mobility status  Activity Tolerance: Patient tolerated treatment well Patient left: in bed;with call bell/phone within reach;with bed alarm set  OT Visit Diagnosis: Other abnormalities of gait and mobility (R26.89)                Time: 1540-0867 OT Time Calculation (min): 30 min Charges:  OT General Charges $OT Visit: 1 Visit OT Evaluation $OT Re-eval: 1 Re-eval OT Treatments $Self Care/Home Management : 23-37 mins  Kathie Dike, M.S. OTR/L  08/31/22, 2:19 PM  ascom [phone]

## 2022-09-01 DIAGNOSIS — R627 Adult failure to thrive: Secondary | ICD-10-CM | POA: Diagnosis not present

## 2022-09-01 DIAGNOSIS — N3000 Acute cystitis without hematuria: Secondary | ICD-10-CM | POA: Diagnosis not present

## 2022-09-01 DIAGNOSIS — F03918 Unspecified dementia, unspecified severity, with other behavioral disturbance: Secondary | ICD-10-CM | POA: Diagnosis not present

## 2022-09-01 LAB — MAGNESIUM: Magnesium: 2.3 mg/dL (ref 1.7–2.4)

## 2022-09-01 LAB — CBC
HCT: 32.4 % — ABNORMAL LOW (ref 39.0–52.0)
Hemoglobin: 10.5 g/dL — ABNORMAL LOW (ref 13.0–17.0)
MCH: 30.3 pg (ref 26.0–34.0)
MCHC: 32.4 g/dL (ref 30.0–36.0)
MCV: 93.4 fL (ref 80.0–100.0)
Platelets: 243 10*3/uL (ref 150–400)
RBC: 3.47 MIL/uL — ABNORMAL LOW (ref 4.22–5.81)
RDW: 14.1 % (ref 11.5–15.5)
WBC: 5.7 10*3/uL (ref 4.0–10.5)
nRBC: 0 % (ref 0.0–0.2)

## 2022-09-01 LAB — BASIC METABOLIC PANEL
Anion gap: 6 (ref 5–15)
BUN: 22 mg/dL (ref 8–23)
CO2: 27 mmol/L (ref 22–32)
Calcium: 8.3 mg/dL — ABNORMAL LOW (ref 8.9–10.3)
Chloride: 108 mmol/L (ref 98–111)
Creatinine, Ser: 0.81 mg/dL (ref 0.61–1.24)
GFR, Estimated: >60 mL/min (ref >60)
Glucose, Bld: 97 mg/dL (ref 70–99)
Potassium: 4.1 mmol/L (ref 3.5–5.1)
Sodium: 141 mmol/L (ref 135–145)

## 2022-09-01 NOTE — Progress Notes (Signed)
Progress Note   Patient: Larry Buckley [id number] DOB: 01-30-1941 DOA: 07/08/2022     53 DOS: the patient was seen and examined on 09/01/2022   Brief hospital course: Larry Buckley is a 81 y.o. male with medical history significant for dementia with behavioral disturbance, hypertension, hyperlipidemia, AAA with repair, gout, who initially presented to [facility] ED after aggressive and combative behavior at the group home.  He was brought into the ED on 07/06/2022 via EMS under IVC.  He has been staying in the ED, seen by psychiatry and social worker, awaiting placement.  Admitted to hospitalist service on 9/19 as patient has increased weakness. Patient has significant agitation, barely eating.  Perative care consult obtained, currently working with DSS for goal of care.   9/27: Was agitated earlier required Geodon.  9/28: TOC working on placement 9/29: Required one-time Geodon this afternoon for agitation.  Psychiatry following and increased his dose of Risperdal and Seroquel today 9/30: Per nursing note patient was combative, agitated and attempting to get out of the bed last night - sleepy this morning 10/1: Urology consulted for hematuria and change of Foley catheter 10/2-10/3: Waiting for placement. 10/4: Patient remained stable.  Waiting for placement.  Patient need LTC placement.  DSS is involved. Palliative care will follow-up as an outpatient. 10/5: Remains stable.  Still waiting for placement. 10/6: No change today.No news on placement. 10/8: Remains stable.  Still waiting for placement 10/11: Patient remained stable.  [place] [facility] evaluated him, if we can remove Foley catheter.  On chart review could not find a reason for Foley catheter except urology note on 10/1 stating that keep Foley for 1 week for concern of falls posterior passage created during an attempt to replace Foley catheter when he accidentally pulled it out causing hematuria.  It has been more than 1 week, we will  remove Foley and give him a voiding trial. Also added Flomax.  10/13: Foley was removed and patient is voiding well.  We will check postvoid bladder scan to rule out any abnormal retention. [place] [facility] evaluated him, pending formal assessment note and acceptance.  No other acute concerns.  10/14; still no definitive answers for disposition.  No other concern  Assessment and Plan: Dementia with behavioral disturbance (HCC) Continue Depakote, Remeron, Seroquel, Risperdal  Patient doing well, baseline mental status.   UTI. Keflex x5 days.  Patient appears better today.   Essential hypertension Blood pressures controlled on amlodipine.   Hematuria, resolved Urinary retention, resolved Patient has resolved, continue Flomax.   Protein-calorie malnutrition, severe Failure to thrive in adult Continue protein supplements.   Central sleep apnea Monitor   Hyperlipidemia  Statin      Subjective:  Patient seems to have a better appetite today, more awake than yesterday.  Physical Exam: Vitals:   08/31/22 1631 08/31/22 1924 09/01/22 0437 09/01/22 0856  BP: 122/72 (!) 131/59 110/60 (!) 115/55  Pulse: 67 77 (!) 58 79  Resp: 18 16 20 18   Temp: 98.6 F (37 C) 98.4 F (36.9 C) 97.8 F (36.6 C) 97.9 F (36.6 C)  TempSrc: Oral Oral Rectal   SpO2: 100% 98% 100% 100%  Weight:      Height:       General exam: Appears calm and comfortable  Respiratory system: Clear to auscultation. Respiratory effort normal. Cardiovascular system: S1 & S2 heard, RRR. No JVD, murmurs, rubs, gallops or clicks. No pedal edema. Gastrointestinal system: Abdomen is nondistended, soft and nontender. No organomegaly or masses felt.  Normal bowel sounds heard. Central nervous system: Alert and oriented x1. No focal neurological deficits. Extremities: Symmetric 5 x 5 power. Skin: No rashes, lesions or ulcers .    Data Reviewed:  Lab results reviewed.  Family Communication:  None  Disposition: Status is: Inpatient Remains inpatient appropriate because: Unsafe discharge.  Planned Discharge Destination:  Long term care    Time spent: 21 minutes  Author: Marrion Coy, MD 09/01/2022 12:19 PM  For on call review [email or web address].

## 2022-09-01 NOTE — Plan of Care (Signed)
  Problem: Safety: Goal: Ability to remain free from injury will improve Outcome: Not Progressing Note: Patient is unable to follow commands but has not eloped from bed this shift. Patient is confused and speech is incomprehensible at times. Patient is currently resting with mitts applied.

## 2022-09-02 ENCOUNTER — Encounter: Payer: Self-pay | Admitting: Internal Medicine

## 2022-09-02 DIAGNOSIS — F03918 Unspecified dementia, unspecified severity, with other behavioral disturbance: Secondary | ICD-10-CM | POA: Diagnosis not present

## 2022-09-02 DIAGNOSIS — E43 Unspecified severe protein-calorie malnutrition: Secondary | ICD-10-CM | POA: Diagnosis not present

## 2022-09-02 DIAGNOSIS — I10 Essential (primary) hypertension: Secondary | ICD-10-CM | POA: Diagnosis not present

## 2022-09-02 NOTE — Progress Notes (Signed)
Physical Therapy Treatment Patient Details Name: Larry Buckley MRN: [id number] DOB: 01/29/1941 Today's Date: 09/02/2022   History of Present Illness Pt admitted for dementia with behavior disturbance (appears improved since admission). Pt from family care home and is poor historian.    PT Comments    Pt continues to be unable to follow simple commands requiring overall +2 assist for physical assistance for bed mobility and transfers.  Current PT d/c recommendation is appropriate.  Recommendations for follow up therapy are one component of a multi-disciplinary discharge planning process, led by the attending physician.  Recommendations may be updated based on patient status, additional functional criteria and insurance authorization.  Follow Up Recommendations  Follow physician's recommendations for discharge plan and follow up therapies Can patient physically be transported by private vehicle: No   Assistance Recommended at Discharge Frequent or constant Supervision/Assistance  Patient can return home with the following Two people to help with walking and/or transfers;Two people to help with bathing/dressing/bathroom   Equipment Recommendations       Recommendations for Other Services       Precautions / Restrictions Precautions Precautions: Fall Restrictions Weight Bearing Restrictions: No     Mobility  Bed Mobility Overal bed mobility: Needs Assistance Bed Mobility: Supine to Sit, Sit to Supine, Rolling Rolling: Mod assist   Supine to sit: +2 for safety/equipment Sit to supine: +2 for safety/equipment   General bed mobility comments: total assist +2 for scooting up in bed    Transfers Overall transfer level: Needs assistance Equipment used: None Transfers: Sit to/from Stand Sit to Stand: +2 physical assistance           General transfer comment: clears rear into half standing on mulitple attempts. UNable to achieve fully upright posture with MAX A +2.     Ambulation/Gait               General Gait Details: unsafe/unable   Stairs             Wheelchair Mobility    Modified Rankin (Stroke Patients Only)       Balance Overall balance assessment: Needs assistance Sitting-balance support: Feet supported, Single extremity supported Sitting balance-Leahy Scale: Fair Sitting balance - Comments: maintains when able to attend. Postural control: Right lateral lean Standing balance support: Bilateral upper extremity supported Standing balance-Leahy Scale: Poor Standing balance comment: very heavy posterior bias, absent awareness, absent righting reactions                            Cognition Arousal/Alertness: Awake/alert   Overall Cognitive Status: No family/caregiver present to determine baseline cognitive functioning                                 General Comments: unable to follow even simple commands; requiring hand-over-hand assist for initiation of all functional tasks        Exercises      General Comments        Pertinent Vitals/Pain Pain Assessment Faces Pain Scale: No hurt    Home Living                          Prior Function            PT Goals (current goals can now be found in the care plan section) Acute Rehab PT Goals Patient Stated Goal: unable  to state PT Goal Formulation: Patient unable to participate in goal setting Time For Goal Achievement: 09/13/22 Potential to Achieve Goals: Poor Progress towards PT goals: Not progressing toward goals - comment    Frequency    Min 2X/week      PT Plan Current plan remains appropriate    Co-evaluation              AM-PAC PT "6 Clicks" Mobility   Outcome Measure  Help needed turning from your back to your side while in a flat bed without using bedrails?: A Lot Help needed moving from lying on your back to sitting on the side of a flat bed without using bedrails?: A Lot Help needed moving  to and from a bed to a chair (including a wheelchair)?: Total Help needed standing up from a chair using your arms (e.g., wheelchair or bedside chair)?: Total Help needed to walk in hospital room?: Total Help needed climbing 3-5 steps with a railing? : Total 6 Click Score: 8    End of Session Equipment Utilized During Treatment: Gait belt Activity Tolerance: Patient limited by lethargy Patient left: in bed;with call bell/phone within reach;with nursing/sitter in room Nurse Communication: Mobility status PT Visit Diagnosis: Unsteadiness on feet (R26.81);Muscle weakness (generalized) (M62.81);Difficulty in walking, not elsewhere classified (R26.2)     Time: 9604-5409 PT Time Calculation (min) (ACUTE ONLY): 16 min  Charges:  $Therapeutic Activity: 8-22 mins                    Hortencia Conradi, PTA  09/02/22, 3:29 PM

## 2022-09-02 NOTE — Progress Notes (Signed)
Progress Note   Patient: Larry Buckley [id number] DOB: 09/23/1941 DOA: 07/09/2022     54 DOS: the patient was seen and examined on 09/02/2022   Brief hospital course: LAMBERTO DINAPOLI is a 81 y.o. male with medical history significant for dementia with behavioral disturbance, hypertension, hyperlipidemia, AAA with repair, gout, who initially presented to [facility] ED after aggressive and combative behavior at the group home.  He was brought into the ED on 06/16/2022 via EMS under IVC.  He has been staying in the ED, seen by psychiatry and social worker, awaiting placement.  Admitted to hospitalist service on 9/19 as patient has increased weakness. Patient has significant agitation, barely eating.  Perative care consult obtained, currently working with DSS for goal of care.   9/27: Was agitated earlier required Geodon.  9/28: TOC working on placement 9/29: Required one-time Geodon this afternoon for agitation.  Psychiatry following and increased his dose of Risperdal and Seroquel today 9/30: Per nursing note patient was combative, agitated and attempting to get out of the bed last night - sleepy this morning 10/1: Urology consulted for hematuria and change of Foley catheter 10/2-10/3: Waiting for placement. 10/4: Patient remained stable.  Waiting for placement.  Patient need LTC placement.  DSS is involved. Palliative care will follow-up as an outpatient. 10/5: Remains stable.  Still waiting for placement. 10/6: No change today.No news on placement. 10/8: Remains stable.  Still waiting for placement 10/11: Patient remained stable.  [place] [facility] evaluated him, if we can remove Foley catheter.  On chart review could not find a reason for Foley catheter except urology note on 10/1 stating that keep Foley for 1 week for concern of falls posterior passage created during an attempt to replace Foley catheter when he accidentally pulled it out causing hematuria.  It has been more than 1 week, we will  remove Foley and give him a voiding trial. Also added Flomax.  10/13: Foley was removed and patient is voiding well.  We will check postvoid bladder scan to rule out any abnormal retention. [place] [facility] evaluated him, pending formal assessment note and acceptance.  No other acute concerns.  10/14; still no definitive answers for disposition.  No other concern  Assessment and Plan: Dementia with behavioral disturbance (HCC) Continue Depakote, Remeron, Seroquel, Risperdal  Patient doing well, baseline mental status.   UTI. Improving, continue finish 5 days of Keflex.   Essential hypertension Blood pressures controlled on amlodipine.   Hematuria, resolved Urinary retention, resolved Patient has resolved, continue Flomax.   Protein-calorie malnutrition, severe Failure to thrive in adult Continue protein supplements.   Central sleep apnea Monitor   Hyperlipidemia  Statin      Subjective:  Patient doing well, no new issues.  Physical Exam: Vitals:   09/01/22 1629 09/01/22 1918 09/02/22 0630 09/02/22 0812  BP: 133/71 (!) 152/69 106/62 120/66  Pulse: 62 96 (!) 53 100  Resp:  20 16 16   Temp: 98 F (36.7 C) 97.8 F (36.6 C) 98.1 F (36.7 C) 97.6 F (36.4 C)  TempSrc: Oral Oral  Oral  SpO2: 100% 98% 100% 97%  Weight:      Height:       General exam: Appears calm and comfortable  Respiratory system: Clear to auscultation. Respiratory effort normal. Cardiovascular system: S1 & S2 heard, RRR. No JVD, murmurs, rubs, gallops or clicks. No pedal edema. Gastrointestinal system: Abdomen is nondistended, soft and nontender. No organomegaly or masses felt. Normal bowel sounds heard. Central nervous system: Alert  and oriented x2. No focal neurological deficits. Extremities: Symmetric 5 x 5 power. Skin: No rashes, lesions or ulcers Psychiatry: Mood & affect appropriate.   Data Reviewed:  There are no new results to review at this time.  Family Communication:  None  Disposition: Status is: Inpatient Remains inpatient appropriate because: Unsafe discharge.  Planned Discharge Destination:  Long-term care with self-pay.    Time spent: 16 minutes  Author: Marrion Coy, MD 09/02/2022 11:00 AM  For on call review [email or web address].

## 2022-09-03 DIAGNOSIS — N3000 Acute cystitis without hematuria: Secondary | ICD-10-CM | POA: Diagnosis not present

## 2022-09-03 DIAGNOSIS — F03918 Unspecified dementia, unspecified severity, with other behavioral disturbance: Secondary | ICD-10-CM | POA: Diagnosis not present

## 2022-09-03 NOTE — Plan of Care (Signed)

## 2022-09-03 NOTE — Progress Notes (Signed)
Progress Note   Patient: Larry Buckley OVZ:[id number] DOB: [birth year] DOA: 07/04/2022     55 DOS: the patient was seen and examined on 09/03/2022   Brief hospital course: DWAIN HUHN is a 81 y.o. male with medical history significant for dementia with behavioral disturbance, hypertension, hyperlipidemia, AAA with repair, gout, who initially presented to [facility] ED after aggressive and combative behavior at the group home.  He was brought into the ED on 06/17/2022 via EMS under IVC.  He has been staying in the ED, seen by psychiatry and social worker, awaiting placement.  Admitted to hospitalist service on 9/19 as patient has increased weakness. Patient has significant agitation, barely eating.  Perative care consult obtained, currently working with DSS for goal of care.   9/27: Was agitated earlier required Geodon.  9/28: TOC working on placement 9/29: Required one-time Geodon this afternoon for agitation.  Psychiatry following and increased his dose of Risperdal and Seroquel today 9/30: Per nursing note patient was combative, agitated and attempting to get out of the bed last night - sleepy this morning 10/1: Urology consulted for hematuria and change of Foley catheter 10/2-10/3: Waiting for placement. 10/4: Patient remained stable.  Waiting for placement.  Patient need LTC placement.  DSS is involved. Palliative care will follow-up as an outpatient. 10/5: Remains stable.  Still waiting for placement. 10/6: No change today.No news on placement. 10/8: Remains stable.  Still waiting for placement 10/11: Patient remained stable.  [facility] evaluated him, if we can remove Foley catheter.  On chart review could not find a reason for Foley catheter except urology note on 10/1 stating that keep Foley for 1 week for concern of falls posterior passage created during an attempt to replace Foley catheter when he accidentally pulled it out causing hematuria.  It has been more than 1 week, we will  remove Foley and give him a voiding trial. Also added Flomax.  10/13: Foley was removed and patient is voiding well.  We will check postvoid bladder scan to rule out any abnormal retention.  house evaluated him, pending formal assessment note and acceptance.  No other acute concerns.  10/14; still no definitive answers for disposition.  No other concern  Assessment and Plan: Dementia with behavioral disturbance (HCC) Continue Depakote, Remeron, Seroquel, Risperdal  Patient doing well, baseline mental status.   UTI. Improving, continue finish 5 days of Keflex.   Essential hypertension Blood pressures controlled on amlodipine.   Hematuria, resolved Urinary retention, resolved Patient has resolved, continue Flomax.   Protein-calorie malnutrition, severe Failure to thrive in adult Continue protein supplements.   Central sleep apnea Monitor   Hyperlipidemia  Statin  Patient doing well, stable, no change in treatment plan.     Subjective: General exam: Appears calm and comfortable  Respiratory system: Clear to auscultation. Respiratory effort normal. Cardiovascular system: S1 & S2 heard, RRR. No JVD, murmurs, rubs, gallops or clicks. No pedal edema. Gastrointestinal system: Abdomen is nondistended, soft and nontender. No organomegaly or masses felt. Normal bowel sounds heard. Central nervous system: Alert and oriented x2. No focal neurological deficits. Extremities: Symmetric 5 x 5 power. Skin: No rashes, lesions or ulcers Psychiatry: Mood & affect appropriate.    Physical Exam: Vitals:   09/02/22 1515 09/02/22 1930 09/03/22 0532 09/03/22 0806  BP: (!) 104/52 137/71 133/67 127/60  Pulse: 95 81 (!) 49 95  Resp: 16 20 16 16   Temp: 97.8 F (36.6 C) 98.1 F (36.7 C) (!) 97.2 F (36.2 C) 97.7  F (36.5 C)  TempSrc: Oral Oral Axillary Oral  SpO2: 97% 100% 100% 98%  Weight:      Height:       General exam: Appears calm and comfortable  Respiratory system:  Clear to auscultation. Respiratory effort normal. Cardiovascular system: S1 & S2 heard, RRR. No JVD, murmurs, rubs, gallops or clicks. No pedal edema. Gastrointestinal system: Abdomen is nondistended, soft and nontender. No organomegaly or masses felt. Normal bowel sounds heard. Central nervous system: Alert and oriented x2. No focal neurological deficits. Extremities: Symmetric 5 x 5 power. Skin: No rashes, lesions or ulcers Psychiatry: Mood & affect appropriate.   Data Reviewed:  There are no new results to review at this time.  Family Communication: None  Disposition: Status is: Inpatient Remains inpatient appropriate because: Unsafe discharge plan.  Planned Discharge Destination:  Long-term care with self-pay.    Time spent: 19 minutes  Author: Marrion Coy, MD 09/03/2022 10:26 AM  For on call review [email or web address].

## 2022-09-04 DIAGNOSIS — F03918 Unspecified dementia, unspecified severity, with other behavioral disturbance: Secondary | ICD-10-CM | POA: Diagnosis not present

## 2022-09-04 DIAGNOSIS — R627 Adult failure to thrive: Secondary | ICD-10-CM | POA: Diagnosis not present

## 2022-09-04 NOTE — Progress Notes (Signed)
OT Cancellation Note  Patient Details Name: Larry Buckley MRN: [id number] DOB: June 03, 1941   Cancelled Treatment:    Reason Eval/Treat Not Completed: Fatigue/lethargy limiting ability to participate. OT attempted to wake patient by shaking the bed, providing firm pressure on knee and hand, sternal rub, and calling patient's name. OT repositioned pt dependently (as pt leaning to the R with upper body in bed) and positioned with pillow to prevent further lean; pt with slight grimace when repositioned, but no vocalization. Pt appears to be breathing comfortably. OT reported to RN.   Alvester Morin 09/04/2022, 3:01 PM

## 2022-09-04 NOTE — Progress Notes (Signed)
Progress Note   Patient: Larry Buckley:[id number] DOB: 1940-10-20 DOA: 07-22-22     56 DOS: the patient was seen and examined on 09/04/2022   Brief hospital course: Larry Buckley is a 81 y.o. male with medical history significant for dementia with behavioral disturbance, hypertension, hyperlipidemia, AAA with repair, gout, who initially presented to [facility] ED after aggressive and combative behavior at the group home.  He was brought into the ED on July 22, 2022 via EMS under IVC.  He has been staying in the ED, seen by psychiatry and social worker, awaiting placement.  Admitted to hospitalist service on 9/19 as patient has increased weakness. Patient has significant agitation, barely eating.  Perative care consult obtained, currently working with DSS for goal of care.   9/27: Was agitated earlier required Geodon.  9/28: TOC working on placement 9/29: Required one-time Geodon this afternoon for agitation.  Psychiatry following and increased his dose of Risperdal and Seroquel today 9/30: Per nursing note patient was combative, agitated and attempting to get out of the bed last night - sleepy this morning 10/1: Urology consulted for hematuria and change of Foley catheter 10/2-10/3: Waiting for placement. 10/4: Patient remained stable.  Waiting for placement.  Patient need LTC placement.  DSS is involved. Palliative care will follow-up as an outpatient. 10/5: Remains stable.  Still waiting for placement. 10/6: No change today.No news on placement. 10/8: Remains stable.  Still waiting for placement 10/11: Patient remained stable.  Larry Buckley evaluated him, if we can remove Foley catheter.  On chart review could not find a reason for Foley catheter except urology note on 10/1 stating that keep Foley for 1 week for concern of falls posterior passage created during an attempt to replace Foley catheter when he accidentally pulled it out causing hematuria.  It has been more than 1 week, we will  remove Foley and give him a voiding trial. Also added Flomax.  10/13: Foley was removed and patient is voiding well.  We will check postvoid bladder scan to rule out any abnormal retention. [place] [facility] evaluated him, pending formal assessment note and acceptance.  No other acute concerns.  10/14; still no definitive answers for disposition.  No other concern  Assessment and Plan: Dementia with behavioral disturbance (HCC) Continue Depakote, Remeron, Seroquel, Risperdal  Patient doing well, baseline mental status.   UTI. Improving, continue finish 5 days of Keflex.   Essential hypertension Blood pressures controlled on amlodipine.   Hematuria, resolved Urinary retention, resolved Patient has resolved, continue Flomax.   Protein-calorie malnutrition, severe Failure to thrive in adult Continue protein supplements.   Central sleep apnea Monitor   Hyperlipidemia  Statin    Patient doing well, no change in treatment plan.      Subjective:  Patient has no complaints.  Physical Exam: Vitals:   09/03/22 1555 09/03/22 1950 09/04/22 0622 09/04/22 0824  BP: 139/60 133/71 125/66 (!) 131/57  Pulse: (!) 54 91 60 (!) 57  Resp: 16 16 16 16   Temp: 97.9 F (36.6 C) 99.4 F (37.4 C) 97.6 F (36.4 C) (!) 97.4 F (36.3 C)  TempSrc: Oral Oral Oral Oral  SpO2: 100% 100% 99%   Weight:      Height:       General exam: Appears calm and comfortable  Respiratory system: Clear to auscultation. Respiratory effort normal. Cardiovascular system: S1 & S2 heard, RRR. No JVD, murmurs, rubs, gallops or clicks. No pedal edema. Gastrointestinal system: Abdomen is nondistended, soft and nontender. No organomegaly  or masses felt. Normal bowel sounds heard. Central nervous system: Alert and oriented x2. No focal neurological deficits. Extremities: Symmetric 5 x 5 power. Skin: No rashes, lesions or ulcers Psychiatry:  Mood & affect appropriate.   Data Reviewed:  There are no new results  to review at this time.  Family Communication: None  Disposition: Status is: Inpatient Remains inpatient appropriate because: Unsafe discharge.  Planned Discharge Destination: Skilled nursing facility    Time spent: 19 minutes  Author: Marrion Coy, MD 09/04/2022 12:32 PM  For on call review [email or web address].

## 2022-09-05 DIAGNOSIS — E43 Unspecified severe protein-calorie malnutrition: Secondary | ICD-10-CM | POA: Diagnosis not present

## 2022-09-05 DIAGNOSIS — F03918 Unspecified dementia, unspecified severity, with other behavioral disturbance: Secondary | ICD-10-CM | POA: Diagnosis not present

## 2022-09-05 DIAGNOSIS — R627 Adult failure to thrive: Secondary | ICD-10-CM | POA: Diagnosis not present

## 2022-09-05 LAB — GLUCOSE, CAPILLARY: Glucose-Capillary: 99 mg/dL (ref 70–99)

## 2022-09-05 NOTE — Progress Notes (Signed)
Occupational Therapy Treatment Patient Details Name: Larry Buckley MRN: [id number] DOB: [birth year] Today's Date: 09/05/2022   History of present illness Pt admitted for dementia with behavior disturbance (appears improved since admission). Pt from family care home and is poor historian.   OT comments  Mr Victorio was seen for OT/PT co- treatment on this date. Upon arrival to room pt reclined in bed, agreeable to tx. Pt requires MAX A x2 for bed mobility and standing at EOB. MAX A x2 for pericare in standing at EOB. MOD A tooth brushing in sitting, assist for cues and sequencing task. Pt unable to follow MAX cues or hand over hand assist to complete face washing in sitting. Pt continues to be limited by cognition, will continue to follow POC. Discharge recommendation remains appropriate.     Recommendations for follow up therapy are one component of a multi-disciplinary discharge planning process, led by the attending physician.  Recommendations may be updated based on patient status, additional functional criteria and insurance authorization.    Follow Up Recommendations  Long-term institutional care without follow-up therapy     Assistance Recommended at Discharge Frequent or constant Supervision/Assistance  Patient can return home with the following  A lot of help with walking and/or transfers;Assist for transportation;Direct supervision/assist for medications management;Direct supervision/assist for financial management;Assistance with cooking/housework;A lot of help with bathing/dressing/bathroom   Equipment Recommendations  Other (comment) (defer)    Recommendations for Other Services      Precautions / Restrictions Precautions Precautions: Fall Restrictions Weight Bearing Restrictions: No       Mobility Bed Mobility Overal bed mobility: Needs Assistance Bed Mobility: Supine to Sit, Sit to Supine     Supine to sit: Max assist, +2 for physical assistance Sit to supine:  Total assist, +2 for safety/equipment        Transfers Overall transfer level: Needs assistance Equipment used: Rolling walker (2 wheels) Transfers: Sit to/from Stand Sit to Stand: +2 physical assistance, Max assist                 Balance Overall balance assessment: Needs assistance Sitting-balance support: Feet supported, Single extremity supported, Bilateral upper extremity supported Sitting balance-Leahy Scale: Fair     Standing balance support: Bilateral upper extremity supported Standing balance-Leahy Scale: Zero                             ADL either performed or assessed with clinical judgement   ADL Overall ADL's : Needs assistance/impaired                                       General ADL Comments: MAX A x2 for pericare in standing at EOB. MOD A tooth brushing in sitting, assist for cues and sequencing task. Pt unable to follow MAX cues or hand over hand assist to complete face washing in sitting      Cognition Arousal/Alertness: Awake/alert Behavior During Therapy: Flat affect Overall Cognitive Status: No family/caregiver present to determine baseline cognitive functioning                                 General Comments: Requires MOD multimodal cues to sequence ADLs - once toothbrush placed in mouth pt able to complete brushing, verbal cues to use spit cup.  Pertinent Vitals/ Pain       Pain Assessment Pain Assessment: No/denies pain   Frequency  Min 1X/week        Progress Toward Goals  OT Goals(current goals can now be found in the care plan section)  Progress towards OT goals: Progressing toward goals  Acute Rehab OT Goals Patient Stated Goal: none stated OT Goal Formulation: Patient unable to participate in goal setting Time For Goal Achievement: 09/14/22 Potential to Achieve Goals: Poor ADL Goals Pt Will Perform Grooming: with set-up;with supervision;standing Pt Will Perform Lower  Body Dressing: with min assist;sit to/from stand Pt Will Transfer to Toilet: with min assist;ambulating;bedside commode  Plan Discharge plan remains appropriate;Frequency remains appropriate    Co-evaluation    PT/OT/SLP Co-Evaluation/Treatment: Yes Reason for Co-Treatment: Necessary to address cognition/behavior during functional activity;For patient/therapist safety;To address functional/ADL transfers PT goals addressed during session: Mobility/safety with mobility OT goals addressed during session: ADL's and self-care      AM-PAC OT "6 Clicks" Daily Activity     Outcome Measure   Help from another person eating meals?: A Little Help from another person taking care of personal grooming?: A Little Help from another person toileting, which includes using toliet, bedpan, or urinal?: A Lot Help from another person bathing (including washing, rinsing, drying)?: A Lot Help from another person to put on and taking off regular upper body clothing?: A Little Help from another person to put on and taking off regular lower body clothing?: A Lot 6 Click Score: 15    End of Session Equipment Utilized During Treatment: Rolling walker (2 wheels)  OT Visit Diagnosis: Other abnormalities of gait and mobility (R26.89)   Activity Tolerance Patient tolerated treatment well   Patient Left in bed;with call bell/phone within reach;with bed alarm set;with nursing/sitter in room   Nurse Communication Mobility status        Time: 4920-1007 OT Time Calculation (min): 22 min  Charges: OT General Charges $OT Visit: 1 Visit OT Treatments $Self Care/Home Management : 8-22 mins  Kathie Dike, M.S. OTR/L  09/05/22, 1:07 PM  ascom [phone]

## 2022-09-05 NOTE — Progress Notes (Signed)
Progress Note   Patient: Larry Buckley [id number] DOB: [birth year] DOA: 07/03/2022     57 DOS: the patient was seen and examined on 09/05/2022   Brief hospital course: Larry Buckley is a 81 y.o. male with medical history significant for dementia with behavioral disturbance, hypertension, hyperlipidemia, AAA with repair, gout, who initially presented to [facility] ED after aggressive and combative behavior at the group home.  He was brought into the ED on 07/08/2022 via EMS under IVC.  He has been staying in the ED, seen by psychiatry and social worker, awaiting placement.  Admitted to hospitalist service on 9/19 as patient has increased weakness. Patient has significant agitation, barely eating.  Perative care consult obtained, currently working with DSS for goal of care.   9/27: Was agitated earlier required Geodon.  9/28: TOC working on placement 9/29: Required one-time Geodon this afternoon for agitation.  Psychiatry following and increased his dose of Risperdal and Seroquel today 9/30: Per nursing note patient was combative, agitated and attempting to get out of the bed last night - sleepy this morning 10/1: Urology consulted for hematuria and change of Foley catheter 10/2-10/3: Waiting for placement. 10/4: Patient remained stable.  Waiting for placement.  Patient need LTC placement.  DSS is involved. Palliative care will follow-up as an outpatient. 10/5: Remains stable.  Still waiting for placement. 10/6: No change today.No news on placement. 10/8: Remains stable.  Still waiting for placement 10/11: Patient remained stable.  [facility] evaluated him, if we can remove Foley catheter.  On chart review could not find a reason for Foley catheter except urology note on 10/1 stating that keep Foley for 1 week for concern of falls posterior passage created during an attempt to replace Foley catheter when he accidentally pulled it out causing hematuria.  It has been more than 1 week, we will  remove Foley and give him a voiding trial. Also added Flomax.  10/13: Foley was removed and patient is voiding well.  We will check postvoid bladder scan to rule out any abnormal retention. [place] evaluated him, pending formal assessment note and acceptance.  No other acute concerns.  10/14; still no definitive answers for disposition.  No other concern  Assessment and Plan: Dementia with behavioral disturbance (HCC) Continue Depakote, Remeron, Seroquel, Risperdal  Patient doing well, baseline mental status.   UTI. Antibiotic completed.   Essential hypertension Blood pressures controlled on amlodipine.   Hematuria, resolved Urinary retention, resolved Patient has resolved, continue Flomax.   Protein-calorie malnutrition, severe Failure to thrive in adult Continue protein supplements.   Central sleep apnea Monitor   Hyperlipidemia  Statin  No new issues, pending nursing placement.    Subjective:  Patient is stable, no complaints today.  Physical Exam: Vitals:   09/04/22 1554 09/04/22 2016 09/05/22 0632 09/05/22 0840  BP: 125/70 125/67 129/84 (!) 142/90  Pulse: 68 91 83 (!) 45  Resp: 16 18 18 16   Temp: 97.7 F (36.5 C) 98.7 F (37.1 C) 98.7 F (37.1 C) 98.3 F (36.8 C)  TempSrc: Oral Oral    SpO2: 100% 100% 100% (!) 88%  Weight:      Height:       General exam: Appears calm and comfortable  Respiratory system: Clear to auscultation. Respiratory effort normal. Cardiovascular system: S1 & S2 heard, RRR. No JVD, murmurs, rubs, gallops or clicks. No pedal edema. Gastrointestinal system: Abdomen is nondistended, soft and nontender. No organomegaly or masses felt. Normal bowel sounds heard. Central nervous system:  Alert and oriented x2. No focal neurological deficits. Extremities: Symmetric 5 x 5 power. Skin: No rashes, lesions or ulcers Psychiatry: Judgement and insight appear normal. Mood & affect appropriate.   Data Reviewed:  There are no new  results to review at this time.  Family Communication: None  Disposition: Status is: Inpatient Remains inpatient appropriate because: Unsafe discharge.  Planned Discharge Destination: Skilled nursing facility    Time spent: 23 minutes  Author: Marrion Coy, MD 09/05/2022 1:21 PM  For on call review [email or web address].

## 2022-09-05 NOTE — TOC Progression Note (Addendum)
Transition of Care [facility]) - Progression Note    Patient Details  Name: Larry Buckley MRN: [id number] Date of Birth: 04-Sep-1941  Transition of Care [facility]) CM/SW Contact  Margarito Liner, LCSW Phone Number: 09/05/2022, 11:50 AM  Clinical Narrative:  [facility] liaison is checking on update regarding bed availability.   12:41 pm: No LTC bed available at this time. Emailed TOC/DSS team to notify.  3:44 pm: Shanda Bumps with DSS will update son today.  Expected Discharge Plan: Memory Care Barriers to Discharge: Other (must enter comment) (Facility will not accept patient back)  Expected Discharge Plan and Services Expected Discharge Plan: Memory Care   Discharge Planning Services: CM Consult   Living arrangements for the past 2 months: Group Home                 DME Arranged: N/A DME Agency: NA       HH Arranged: NA HH Agency: NA         Social Determinants of Health (SDOH) Interventions    Readmission Risk Interventions     No data to display

## 2022-09-05 NOTE — Plan of Care (Signed)
  Problem: Activity: Goal: Risk for activity intolerance will decrease Outcome: Not Progressing   Problem: Nutrition: Goal: Adequate nutrition will be maintained Outcome: Progressing   Problem: Elimination: Goal: Will not experience complications related to bowel motility Outcome: Progressing Goal: Will not experience complications related to urinary retention Outcome: Progressing   Problem: Safety: Goal: Ability to remain free from injury will improve Outcome: Progressing   Problem: Skin Integrity: Goal: Risk for impaired skin integrity will decrease Outcome: Progressing

## 2022-09-05 NOTE — Progress Notes (Signed)
Physical Therapy Treatment Patient Details Name: Larry Buckley MRN: [id number] DOB: [birth year] Today's Date: 09/05/2022   History of Present Illness Pt admitted for dementia with behavior disturbance (appears improved since admission). Pt from family care home and is poor historian.    PT Comments    Pt tolerated treatment fair today; overall limited with participation and progressing mobility secondary to dementia and difficulty with command following. Able to follow ~25-50% of simple 1 step commands, demonstrating improved sequencing and attention to task with visual cues. Increased assist and cueing for initiation of movement. Able to maintain static seated balance at EOB for ~20min with superivision while performing multiplanar reaching outside BOS, seated marches, and self-care ADL's with OT. Performed multiple STS from EOB for pericare after incontinent BM episode; increased physical assist and cueing with pt unable to achieve full upright standing. Pt will continue to benefit from skilled acute PT services on trial basis, as he has demonstrated improved participation and command following since last session. Continue to recommend long-term care at [facility].     Recommendations for follow up therapy are one component of a multi-disciplinary discharge planning process, led by the attending physician.  Recommendations may be updated based on patient status, additional functional criteria and insurance authorization.  Follow Up Recommendations  Follow physician's recommendations for discharge plan and follow up therapies Can patient physically be transported by private vehicle: No   Assistance Recommended at Discharge Frequent or constant Supervision/Assistance  Patient can return home with the following Two people to help with walking and/or transfers;Two people to help with bathing/dressing/bathroom   Equipment Recommendations   (defer to post acute)       Precautions / Restrictions  Precautions Precautions: Fall Restrictions Weight Bearing Restrictions: No     Mobility  Bed Mobility Overal bed mobility: Needs Assistance Bed Mobility: Supine to Sit, Sit to Supine     Supine to sit: Max assist Sit to supine: Total assist, +2 for safety/equipment   General bed mobility comments: max assist for supine>sit with hand over hand cues for sequencing for task completion; total assist +2 to lie supine for trunk/BLE facilitation; limited secondary to baseline dementia    Transfers Overall transfer level: Needs assistance Equipment used: Rolling walker (2 wheels) Transfers: Sit to/from Stand Sit to Stand: +2 physical assistance, Max assist           General transfer comment: stands from EOB x2 with RW, max assist +2, unable to achieve full standing despite max multimodal cues for trunk/hip extension. performs additional x2 stands with max assist from therapist in squat position for pericare after incontinent BM episode.    Ambulation/Gait               General Gait Details: unsafe/unable   Balance Overall balance assessment: Needs assistance Sitting-balance support: Feet supported, Single extremity supported, Bilateral upper extremity supported Sitting balance-Leahy Scale: Fair Sitting balance - Comments: supervision for safety; able to perform reaching outside BOS, ADL's, and seated marching without LOB     Standing balance-Leahy Scale: Zero Standing balance comment: max-total for static standing balance in squat position with bil knees blocked; unable to acheive full upright standing                            Cognition Arousal/Alertness: Awake/alert Behavior During Therapy: Flat affect Overall Cognitive Status: No family/caregiver present to determine baseline cognitive functioning  General Comments: able to follow ~25-50% of simple one step commands, with improved processing/sequencing with  visual cues. Increased time/effort for processing. Alert to self.        Exercises Other Exercises Other Exercises: Participates in bed mobility and transfers; assist for pericare in standing. Increased assist secondary to cognition and difficulty sequencing. Performs reaching outside BOS and seated marching without LOB. Performs self-care ADL's with OT seated EOB.        Pertinent Vitals/Pain Pain Assessment Pain Assessment: No/denies pain     PT Goals (current goals can now be found in the care plan section) Acute Rehab PT Goals Patient Stated Goal: unable to state PT Goal Formulation: Patient unable to participate in goal setting Time For Goal Achievement: 09/13/22 Potential to Achieve Goals: Poor Progress towards PT goals: Progressing toward goals (overall limited secondary to dementia/cognition)    Frequency    Min 2X/week      PT Plan Current plan remains appropriate    Co-evaluation PT/OT/SLP Co-Evaluation/Treatment: Yes Reason for Co-Treatment: Necessary to address cognition/behavior during functional activity;For patient/therapist safety;To address functional/ADL transfers PT goals addressed during session: Mobility/safety with mobility OT goals addressed during session: ADL's and self-care      AM-PAC PT "6 Clicks" Mobility   Outcome Measure  Help needed turning from your back to your side while in a flat bed without using bedrails?: A Lot Help needed moving from lying on your back to sitting on the side of a flat bed without using bedrails?: A Lot Help needed moving to and from a bed to a chair (including a wheelchair)?: Total Help needed standing up from a chair using your arms (e.g., wheelchair or bedside chair)?: Total Help needed to walk in hospital room?: Total Help needed climbing 3-5 steps with a railing? : Total 6 Click Score: 8    End of Session Equipment Utilized During Treatment: Gait belt Activity Tolerance: Patient limited by  lethargy Patient left: in bed;with call bell/phone within reach;with nursing/sitter in room;with bed alarm set Nurse Communication: Mobility status PT Visit Diagnosis: Unsteadiness on feet (R26.81);Muscle weakness (generalized) (M62.81);Difficulty in walking, not elsewhere classified (R26.2)     Time: 0347-4259 (co-treat with OT) PT Time Calculation (min) (ACUTE ONLY): 25 min  Charges:  $Therapeutic Activity: 8-22 mins                      Vira Blanco, PT, DPT 10:29 AM,09/05/22 Physical Therapist - [facility] [place]

## 2022-09-05 NOTE — Progress Notes (Signed)
Mobility Specialist - Progress Note   09/05/22 1500  Mobility  Activity Transferred from chair to bed  Level of Assistance +2 (takes two people)  Assistive Device None  Activity Response Tolerated well  $Mobility charge 1 Mobility     Pt sitting in recliner upon arrival, utilizing RA. Pt does not follow commands during session. Redirection needed. Pushes RW out of the way several times; +2 modA used for SPT transfer. Pt does not achieve full standing position and unable to advance feet to take steps towards bed. Pt sat EOB and returned supine with maxA. Pt left in bed with alarm set, needs in reach.    [name] Mobility Specialist 09/05/22, 3:53 PM

## 2022-09-05 NOTE — Progress Notes (Addendum)
1921 - received report 2050 - administration of scheduled hs medications 2252 - pt restless with legs dangling from side of bed. Pt repositioned. Offered and accepted drinks of water.  0121 - pt resting in bed with eyes closed, respirations even and unlabored, no signs of discomfort or distress noted 0322 - pt resting in bed asking for water 0550 - pt asking for coffee

## 2022-09-06 DIAGNOSIS — I10 Essential (primary) hypertension: Secondary | ICD-10-CM | POA: Diagnosis not present

## 2022-09-06 DIAGNOSIS — E43 Unspecified severe protein-calorie malnutrition: Secondary | ICD-10-CM | POA: Diagnosis not present

## 2022-09-06 DIAGNOSIS — F03918 Unspecified dementia, unspecified severity, with other behavioral disturbance: Secondary | ICD-10-CM | POA: Diagnosis not present

## 2022-09-06 NOTE — TOC Progression Note (Signed)
Transition of Care [facility]) - Progression Note    Patient Details  Name: Larry Buckley MRN: [id number] Date of Birth: 1941/04/12  Transition of Care [facility] CM/SW Contact  Truddie Hidden, RN Phone Number: 09/06/2022, 2:27 PM  Clinical Narrative:    Per HUB no bed offer.    Expected Discharge Plan: Memory Care Barriers to Discharge: Other (must enter comment) (Facility will not accept patient back)  Expected Discharge Plan and Services Expected Discharge Plan: Memory Care   Discharge Planning Services: CM Consult   Living arrangements for the past 2 months: Group Home                 DME Arranged: N/A DME Agency: NA       HH Arranged: NA HH Agency: NA         Social Determinants of Health (SDOH) Interventions    Readmission Risk Interventions     No data to display

## 2022-09-06 NOTE — Progress Notes (Signed)
1924 - received report, pt has legs swung over left side bedside rail. Pt repositioned 2016 - pt has legs swung over right side bed rails. Pt repositioned. Scheduled hs medications administered.  2200 - pt watching television, legs in bed. 2348 - pt resting in bed with eyes closed, respirations even and unlabored, no signs of discomfort or distress noted 0227 - pt resting in bed with eyes closed, respirations even and unlabored, no signs of discomfort or distress noted 0539 - continues to rest in bed with eyes closed, respirations even and unlabored, no signs of discomfort or distress noted. Pt easily awakened when lightly touched.

## 2022-09-06 NOTE — Progress Notes (Signed)
Physical Therapy Treatment Patient Details Name: Larry Buckley MRN: [id number] DOB: 1941-06-15 Today's Date: 09/06/2022   History of Present Illness Pt admitted for dementia with behavior disturbance (appears improved since admission). Pt from family care home and is poor historian.    PT Comments    Pt received in bed, easily awakened, agreed to OOB. Pt required MaxA to transition to edge of bed with [facility] raised and use of linen to slide pt to sitting. Pt initially required ModA to maintain sitting balance and prevent R lateral lean which he was eventually able to sustain with Supervision. Attempted transfer from raised bed to standing at RW, pt unable to attain upright stance. Squat pivot transfer bed to recliner with MaxA, ? If pt resisting movement due to impaired cognition. Pt made comfortable in chair for lunch with chair alarm on. Continue PT per POC.   Recommendations for follow up therapy are one component of a multi-disciplinary discharge planning process, led by the attending physician.  Recommendations may be updated based on patient status, additional functional criteria and insurance authorization.  Follow Up Recommendations  Follow physician's recommendations for discharge plan and follow up therapies Can patient physically be transported by private vehicle: No   Assistance Recommended at Discharge Frequent or constant Supervision/Assistance  Patient can return home with the following Two people to help with walking and/or transfers;Two people to help with bathing/dressing/bathroom   Equipment Recommendations  None recommended by PT (TBD at next facility (?RW, [facility]))    Recommendations for Other Services       Precautions / Restrictions Precautions Precautions: Fall Restrictions Weight Bearing Restrictions: No     Mobility  Bed Mobility Overal bed mobility: Needs Assistance Bed Mobility: Supine to Sit Rolling: Mod assist   Supine to sit: Max assist, HOB  elevated     General bed mobility comments: Pt sat EOB x 5 minutes    Transfers Overall transfer level: Needs assistance Equipment used: Rolling walker (2 wheels) Transfers: Sit to/from Stand Sit to Stand: Max assist, From elevated surface (unable to attain full standing)     Squat pivot transfers: Max assist, From elevated surface     General transfer comment: Unable to safely transfer with use of RW, resulting in squat pivot transfer to bedside recliner    Ambulation/Gait               General Gait Details: unsafe/unable   Stairs             Wheelchair Mobility    Modified Rankin (Stroke Patients Only)       Balance Overall balance assessment: Needs assistance Sitting-balance support: Feet supported, Single extremity supported, Bilateral upper extremity supported Sitting balance-Leahy Scale: Fair Sitting balance - Comments: supervision for safety; able to perform reaching outside BOS, ADL's, and seated marching without LOB   Standing balance support: Bilateral upper extremity supported, Reliant on assistive device for balance Standing balance-Leahy Scale: Zero Standing balance comment: max-total for static standing balance in squat position with bil knees blocked; unable to acheive full upright standing                            Cognition Arousal/Alertness: Awake/alert Behavior During Therapy: Flat affect Overall Cognitive Status: No family/caregiver present to determine baseline cognitive functioning  General Comments: Repeated simple vc's to maintain engagement        Exercises General Exercises - Lower Extremity Ankle Circles/Pumps: AAROM, Both, 10 reps Long Arc Quad: AAROM, Both, 5 reps Heel Slides: AAROM, Both, 5 reps Hip ABduction/ADduction: AAROM, Both, 10 reps    General Comments General comments (skin integrity, edema, etc.):  (Pt educated on safe transfers and proper  technique)      Pertinent Vitals/Pain Pain Assessment Pain Assessment: No/denies pain    Home Living                          Prior Function            PT Goals (current goals can now be found in the care plan section) Acute Rehab PT Goals Patient Stated Goal: unable to state    Frequency    Min 2X/week      PT Plan Current plan remains appropriate    Co-evaluation              AM-PAC PT "6 Clicks" Mobility   Outcome Measure  Help needed turning from your back to your side while in a flat bed without using bedrails?: A Lot Help needed moving from lying on your back to sitting on the side of a flat bed without using bedrails?: A Lot Help needed moving to and from a bed to a chair (including a wheelchair)?: Total Help needed standing up from a chair using your arms (e.g., wheelchair or bedside chair)?: Total Help needed to walk in hospital room?: Total Help needed climbing 3-5 steps with a railing? : Total 6 Click Score: 8    End of Session Equipment Utilized During Treatment: Gait belt Activity Tolerance: Patient limited by lethargy Patient left: in chair;with call bell/phone within reach;with chair alarm set;with nursing/sitter in room Nurse Communication: Mobility status PT Visit Diagnosis: Unsteadiness on feet (R26.81);Muscle weakness (generalized) (M62.81);Difficulty in walking, not elsewhere classified (R26.2)     Time: 5747-3403 PT Time Calculation (min) (ACUTE ONLY): 32 min  Charges:  $Therapeutic Exercise: 8-22 mins $Therapeutic Activity: 8-22 mins                    Zadie Cleverly, PTA    Jannet Askew 09/06/2022, 12:36 PM

## 2022-09-06 NOTE — Progress Notes (Signed)
Progress Note   Patient: Larry Buckley IRW:[id number] DOB: 02-Jun-1941 DOA: 07/11/2022     58 DOS: the patient was seen and examined on 09/06/2022   Brief hospital course: Larry Buckley is a 81 y.o. male with medical history significant for dementia with behavioral disturbance, hypertension, hyperlipidemia, AAA with repair, gout, who initially presented to [facility] ED after aggressive and combative behavior at the group home.  He was brought into the ED on 07/02/2022 via EMS under IVC.  He has been staying in the ED, seen by psychiatry and social worker, awaiting placement.  Admitted to hospitalist service on 9/19 as patient has increased weakness. Patient has significant agitation, barely eating.  Perative care consult obtained, currently working with DSS for goal of care.   9/27: Was agitated earlier required Geodon.  9/28: TOC working on placement 9/29: Required one-time Geodon this afternoon for agitation.  Psychiatry following and increased his dose of Risperdal and Seroquel today 9/30: Per nursing note patient was combative, agitated and attempting to get out of the bed last night - sleepy this morning 10/1: Urology consulted for hematuria and change of Foley catheter 10/2-10/3: Waiting for placement. 10/4: Patient remained stable.  Waiting for placement.  Patient need LTC placement.  DSS is involved. Palliative care will follow-up as an outpatient. 10/5: Remains stable.  Still waiting for placement. 10/6: No change today.No news on placement. 10/8: Remains stable.  Still waiting for placement 10/11: Patient remained stable.  [facility] evaluated him, if we can remove Foley catheter.  On chart review could not find a reason for Foley catheter except urology note on 10/1 stating that keep Foley for 1 week for concern of falls posterior passage created during an attempt to replace Foley catheter when he accidentally pulled it out causing hematuria.  It has been more than 1 week, we will  remove Foley and give him a voiding trial. Also added Flomax.  10/13: Foley was removed and patient is voiding well.  We will check postvoid bladder scan to rule out any abnormal retention.  11/22.  Had episode of UTI, completed antibiotics.  Still pending placement.  Assessment and Plan: Dementia with behavioral disturbance (HCC) Continue Depakote, Remeron, Seroquel, Risperdal  Patient doing well, baseline mental status.   UTI. Antibiotic completed.   Essential hypertension Blood pressures controlled on amlodipine.   Hematuria, resolved Urinary retention, resolved Patient has resolved, continue Flomax.   Protein-calorie malnutrition, severe Failure to thrive in adult Continue protein supplements.   Central sleep apnea Monitor   Hyperlipidemia  Statin  Been stable, no change in treatment plan.  Currently pending placement.     Subjective:  Patient has no complaints.  Physical Exam: Vitals:   09/05/22 1625 09/05/22 1922 09/06/22 0249 09/06/22 0758  BP: 124/66 135/70 (!) 154/84 (!) 152/94  Pulse: 74 75 80 (!) 58  Resp: 16 18 17 19   Temp: 98.3 F (36.8 C) 98.2 F (36.8 C) 98.4 F (36.9 C) 97.7 F (36.5 C)  TempSrc: Oral   Oral  SpO2:  100% 100% 100%  Weight:      Height:       General exam: Appears calm and comfortable  Respiratory system: Clear to auscultation. Respiratory effort normal. Cardiovascular system: S1 & S2 heard, RRR. No JVD, murmurs, rubs, gallops or clicks. No pedal edema. Gastrointestinal system: Abdomen is nondistended, soft and nontender. No organomegaly or masses felt. Normal bowel sounds heard. Central nervous system: Alert and oriented x2. No focal neurological deficits. Extremities: Symmetric  5 x 5 power. Skin: No rashes, lesions or ulcers Psychiatry:  Mood & affect appropriate.   Data Reviewed:  There are no new results to review at this time.  Family Communication: None  Disposition: Status is: Inpatient Remains inpatient  appropriate because: Unsafe discharge plan.  Planned Discharge Destination: Skilled nursing facility    Time spent: 25 minutes  Author: Sharen Hones, MD 09/06/2022 12:32 PM  For on call review [email or web address].

## 2022-09-06 NOTE — Progress Notes (Signed)
Physical Therapy Treatment Patient Details Name: Larry Buckley MRN: [id number] DOB: Sep 20, 1941 Today's Date: 09/06/2022   History of Present Illness Pt admitted for dementia with behavior disturbance (appears improved since admission). Pt from family care home and is poor historian.    PT Comments    Pt required additional assistance to return to bed after stool incontinence while sitting up in chair. Pt needed MaxA x 2 to stand and maintain flexed forward standing at RW for hygiene. Pt unable to follow commands for proper use of RW and bed was pulled up to pt for safe transfer to sitting.  Will continue to progress mobility as able.   Recommendations for follow up therapy are one component of a multi-disciplinary discharge planning process, led by the attending physician.  Recommendations may be updated based on patient status, additional functional criteria and insurance authorization.  Follow Up Recommendations  Follow physician's recommendations for discharge plan and follow up therapies Can patient physically be transported by private vehicle: No   Assistance Recommended at Discharge Frequent or constant Supervision/Assistance  Patient can return home with the following Two people to help with walking and/or transfers;Two people to help with bathing/dressing/bathroom   Equipment Recommendations  None recommended by PT (?RW, [facility])    Recommendations for Other Services       Precautions / Restrictions Precautions Precautions: Fall Restrictions Weight Bearing Restrictions: No     Mobility  Bed Mobility Overal bed mobility: Needs Assistance Bed Mobility: Sit to Supine Rolling: Mod assist   Supine to sit: Max assist, HOB elevated Sit to supine: Mod assist   General bed mobility comments: Pt sat EOB x 5 minutes    Transfers Overall transfer level: Needs assistance Equipment used: Rolling walker (2 wheels) Transfers: Sit to/from Stand Sit to Stand: +2 physical  assistance, Max assist Stand pivot transfers: Max assist   Squat pivot transfers: Max assist, From elevated surface     General transfer comment: Unable to safely transfer with use of RW, resulting in squat pivot transfer to bedside recliner    Ambulation/Gait               General Gait Details: unsafe/unable   Stairs             Wheelchair Mobility    Modified Rankin (Stroke Patients Only)       Balance Overall balance assessment: Needs assistance Sitting-balance support: Feet supported, Single extremity supported, Bilateral upper extremity supported Sitting balance-Leahy Scale: Fair Sitting balance - Comments: supervision for safety; able to perform reaching outside BOS, ADL's, and seated marching without LOB Postural control: Right lateral lean Standing balance support: Bilateral upper extremity supported, Reliant on assistive device for balance Standing balance-Leahy Scale: Zero Standing balance comment: max-total for static standing balance in squat position with bil knees blocked; unable to acheive full upright standing                            Cognition Arousal/Alertness: Awake/alert Behavior During Therapy: Flat affect Overall Cognitive Status: No family/caregiver present to determine baseline cognitive functioning                                 General Comments: Repeated simple vc's to maintain engagement        Exercises General Exercises - Lower Extremity Ankle Circles/Pumps: AAROM, Both, 10 reps Long Arc Quad: AAROM, Both, 5 reps Heel  Slides: AAROM, Both, 5 reps Hip ABduction/ADduction: AAROM, Both, 10 reps    General Comments General comments (skin integrity, edema, etc.): Assist x 2 needed to return to bed, pt educated on standing up straight and proper use of RW, however he is unable to cognitively understand      Pertinent Vitals/Pain Pain Assessment Pain Assessment: No/denies pain    Home Living                           Prior Function            PT Goals (current goals can now be found in the care plan section) Acute Rehab PT Goals Patient Stated Goal: unable to state    Frequency    Min 2X/week      PT Plan Current plan remains appropriate    Co-evaluation              AM-PAC PT "6 Clicks" Mobility   Outcome Measure  Help needed turning from your back to your side while in a flat bed without using bedrails?: A Lot Help needed moving from lying on your back to sitting on the side of a flat bed without using bedrails?: A Lot Help needed moving to and from a bed to a chair (including a wheelchair)?: Total Help needed standing up from a chair using your arms (e.g., wheelchair or bedside chair)?: Total Help needed to walk in hospital room?: Total Help needed climbing 3-5 steps with a railing? : Total 6 Click Score: 8    End of Session Equipment Utilized During Treatment: Gait belt Activity Tolerance: Patient limited by lethargy Patient left: with call bell/phone within reach;with nursing/sitter in room;in bed;with bed alarm set Nurse Communication: Mobility status (Pt incontinent of stool) PT Visit Diagnosis: Unsteadiness on feet (R26.81);Muscle weakness (generalized) (M62.81);Difficulty in walking, not elsewhere classified (R26.2)     Time: 1410-1420 PT Time Calculation (min) (ACUTE ONLY): 10 min  Charges:  $Therapeutic Exercise: 8-22 mins $Therapeutic Activity: 8-22 mins           Larry Buckley, PTA  Jannet Askew 09/06/2022, 2:25 PM

## 2022-09-07 DIAGNOSIS — F03918 Unspecified dementia, unspecified severity, with other behavioral disturbance: Secondary | ICD-10-CM | POA: Diagnosis not present

## 2022-09-07 NOTE — Progress Notes (Signed)
1918 - received report

## 2022-09-07 NOTE — Progress Notes (Signed)
PROGRESS NOTE    KOLSEN CHOE  Larry Buckley:[id number] DOB: [birth year] DOA: 06/20/2022  PCP: Marjie Skiff, NP   Brief Narrative:  This 81 years old male with PMH significant for dementia with behavioral disturbance, hypertension, hyperlipidemia, AAA with repair, gout, who initially presented to [facility] ED after aggressive and combative behavior at the group home.  He was brought into the ED on 07/04/2022 via EMS under IVC.  He has been staying in the ED, seen by psychiatry and social worker, awaiting placement.  Admitted to hospitalist service on 9/19 as patient has increased weakness. Patient has significant agitation, barely eating.  Palliative care consult obtained, currently working with DSS for goal of care discussion.  Patient is now medically clear, awaiting placement.  Assessment & Plan:   Principal Problem:   Dementia with behavioral disturbance (HCC) Active Problems:   Essential hypertension   Hematuria   Protein-calorie malnutrition, severe   Failure to thrive in adult   Anorexia   Central sleep apnea   UTI (urinary tract infection)  Dementia with behavioral problems: Continue Depakote, Remeron, Seroquel, Risperdal. Patient doing well, back to his baseline mental status.   Urinary tract infection: Treated and completed antibiotics course.   Essential hypertension. Blood pressure remains controlled.   Continue amlodipine.  Hematuria > Resolved.  Urinary retention> Resolved. Hematuria resolved, continue Flomax.   Protein calorie malnutrition, severe: Failure to thrive in adult: Continue nutritional support.  Supplements   Central sleep apnea: Continue supportive care.  CPAP at night   Hyperlipidemia Continue Lipitor 20 mg daily   Patient has been medically clear, no change in treatment plan.  Currently pending placement.   DVT prophylaxis: Lovenox Code Status:Full code Family Communication: No family at bed side. Disposition Plan:   Status is:  Inpatient Remains inpatient appropriate because: Unsafe discharge plan.    Consultants:  Psychiatry  Procedures:None  Antimicrobials:  Anti-infectives (From admission, onward)    Start     Dose/Rate Route Frequency Ordered Stop   08/31/22 2200  cephALEXin (KEFLEX) capsule 500 mg        500 mg Oral Every 12 hours 08/31/22 0948 09/05/22 0941   08/31/22 0800  cephALEXin (KEFLEX) capsule 500 mg  Status:  Discontinued        500 mg Oral Every 6 hours 08/31/22 0743 08/31/22 0948      Subjective: Patient was seen and examined at bedside.  Overnight events noted. Patient seems at his baseline.  Alert and oriented,  following commands.  Objective: Vitals:   09/06/22 1940 09/07/22 0445 09/07/22 0853 09/07/22 0900  BP: 125/69 125/62 131/65 128/64  Pulse: 68 (!) 57 (!) 39 60  Resp: 20 18 18 18   Temp: 99.7 F (37.6 C) 97.9 F (36.6 C) 98.1 F (36.7 C) 98.2 F (36.8 C)  TempSrc: Oral     SpO2: 98% 100% 94% 95%  Weight:      Height:        Intake/Output Summary (Last 24 hours) at 09/07/2022 1341 Last data filed at 09/07/2022 1031 Gross per 24 hour  Intake 480 ml  Output 500 ml  Net -20 ml   Filed Weights   07/04/22 2230 07/06/22 1533  Weight: 84 kg 82 kg    Examination:  General exam: Appears comfortable, not in any acute distress, very deconditioned. Respiratory system: CTA bilaterally, respiratory effort normal, RR 15. Cardiovascular system: S1 & S2 heard, regular rate and rhythm, no murmur. Gastrointestinal system: Abdomen is soft, non tender, non distended, BS+ Central  nervous system: Alert and oriented x 2. No focal neurological deficits. Extremities: No edema, no cyanosis, no clubbing. Skin: No rashes, lesions or ulcers Psychiatry: Judgement and insight appear normal. Mood & affect appropriate.     Data Reviewed: I have personally reviewed following labs and imaging studies  CBC: Recent Labs  Lab 09/01/22 0516  WBC 5.7  HGB 10.5*  HCT 32.4*  MCV 93.4   PLT 243   Basic Metabolic Panel: Recent Labs  Lab 09/01/22 0516  NA 141  K 4.1  CL 108  CO2 27  GLUCOSE 97  BUN 22  CREATININE 0.81  CALCIUM 8.3*  MG 2.3   GFR: Estimated Creatinine Clearance: 81.6 mL/min (by C-G formula based on SCr of 0.81 mg/dL). Liver Function Tests: No results for input(s): "AST", "ALT", "ALKPHOS", "BILITOT", "PROT", "ALBUMIN" in the last 168 hours. No results for input(s): "LIPASE", "AMYLASE" in the last 168 hours. No results for input(s): "AMMONIA" in the last 168 hours. Coagulation Profile: No results for input(s): "INR", "PROTIME" in the last 168 hours. Cardiac Enzymes: No results for input(s): "CKTOTAL", "CKMB", "CKMBINDEX", "TROPONINI" in the last 168 hours. BNP (last 3 results) No results for input(s): "PROBNP" in the last 8760 hours. HbA1C: No results for input(s): "HGBA1C" in the last 72 hours. CBG: Recent Labs  Lab 09/05/22 1027  GLUCAP 99   Lipid Profile: No results for input(s): "CHOL", "HDL", "LDLCALC", "TRIG", "CHOLHDL", "LDLDIRECT" in the last 72 hours. Thyroid Function Tests: No results for input(s): "TSH", "T4TOTAL", "FREET4", "T3FREE", "THYROIDAB" in the last 72 hours. Anemia Panel: No results for input(s): "VITAMINB12", "FOLATE", "FERRITIN", "TIBC", "IRON", "RETICCTPCT" in the last 72 hours. Sepsis Labs: No results for input(s): "PROCALCITON", "LATICACIDVEN" in the last 168 hours.  No results found for this or any previous visit (from the past 240 hour(s)).   Radiology Studies: No results found.  Scheduled Meds:  amLODipine  5 mg Oral Daily   atorvastatin  20 mg Oral QHS   divalproex  500 mg Oral Q12H   enoxaparin (LOVENOX) injection  40 mg Subcutaneous Q24H   feeding supplement  237 mL Oral TID BM   melatonin  10 mg Oral QHS   mirtazapine  15 mg Oral QHS   multivitamin with minerals  1 tablet Oral Daily   polyethylene glycol  17 g Oral BID   QUEtiapine  100 mg Oral QHS   risperiDONE  1 mg Oral BID    senna-docusate  2 tablet Oral BID   tamsulosin  0.4 mg Oral QPC supper   Continuous Infusions:   LOS: 59 days    Time spent: 50 mins    Kapena Hamme, MD [facility]   If 7PM-7AM, please contact night-coverage

## 2022-09-08 DIAGNOSIS — F03918 Unspecified dementia, unspecified severity, with other behavioral disturbance: Secondary | ICD-10-CM | POA: Diagnosis not present

## 2022-09-08 NOTE — Progress Notes (Signed)
PROGRESS NOTE    Larry Buckley  [id number] DOB: 1940-12-28 DOA: 07/15/2022  PCP: Venita Lick, NP   Brief Narrative:  This 81 years old male with PMH significant for dementia with behavioral disturbance, hypertension, hyperlipidemia, AAA with repair, gout, who initially presented to [facility] ED after aggressive and combative behavior at the group home.  He was brought into the ED on 07/03/2022 via EMS under IVC.  He has been staying in the ED, seen by psychiatry and social worker, awaiting placement.  Admitted to hospitalist service on 9/19 as patient has increased weakness. Patient has significant agitation, barely eating.  Palliative care consult obtained, currently working with DSS for goal of care discussion.  Patient is now medically clear, awaiting placement.  Assessment & Plan:   Principal Problem:   Dementia with behavioral disturbance (HCC) Active Problems:   Essential hypertension   Hematuria   Protein-calorie malnutrition, severe   Failure to thrive in adult   Anorexia   Central sleep apnea   UTI (urinary tract infection)  Dementia with behavioral problems: Continue Depakote, Remeron, Seroquel, Risperdal. Patient doing well, back to his baseline mental status. There is no recent behavioral problems noted.   Urinary tract infection: Treated and completed antibiotics course. He denies any further urinary symptoms.   Essential hypertension. Blood pressure remains controlled.   Continue amlodipine.  Hematuria > Resolved.  Urinary retention > Resolved. Hematuria resolved, continue Flomax.   Protein calorie malnutrition, severe: Failure to thrive in adult: Continue nutritional support.  Supplements   Central sleep apnea: Continue supportive care. CPAP at night   Hyperlipidemia Continue Lipitor 20 mg daily   Patient has been medically clear, no change in treatment plan. Currently pending placement.   DVT prophylaxis: Lovenox Code Status:Full  code Family Communication: No family at bed side. Disposition Plan:   Status is: Inpatient Remains inpatient appropriate because: Unsafe discharge plan.  TOC is working on finding a placement.    Consultants:  Psychiatry  Procedures:None  Antimicrobials:  Anti-infectives (From admission, onward)    Start     Dose/Rate Route Frequency Ordered Stop   08/31/22 2200  cephALEXin (KEFLEX) capsule 500 mg        500 mg Oral Every 12 hours 08/31/22 0948 09/05/22 0941   08/31/22 0800  cephALEXin (KEFLEX) capsule 500 mg  Status:  Discontinued        500 mg Oral Every 6 hours 08/31/22 0743 08/31/22 0948      Subjective: Patient was seen and examined at bedside.  Overnight events noted. Patient seems at his baseline. Alert and oriented following commands. He reports he is still feeling hungry after he completed his breakfast.  Objective: Vitals:   09/07/22 1636 09/07/22 2007 09/08/22 0449 09/08/22 0840  BP: 124/71 120/73 129/69 132/87  Pulse: 60 89 61 79  Resp: 16 20 16 18   Temp: 98.4 F (36.9 C) 98.3 F (36.8 C) 97.6 F (36.4 C) 97.7 F (36.5 C)  TempSrc: Oral Oral  Oral  SpO2: 98% 100% 100% 100%  Weight:      Height:        Intake/Output Summary (Last 24 hours) at 09/08/2022 1132 Last data filed at 09/08/2022 0448 Gross per 24 hour  Intake 960 ml  Output 750 ml  Net 210 ml   Filed Weights   07/04/22 2230 07/06/22 1533  Weight: 84 kg 82 kg    Examination:  General exam: Appears comfortable, not in any acute distress, very deconditioned. Respiratory system: CTA bilaterally,  respiratory effort normal, RR 13. Cardiovascular system: S1 & S2 heard, regular rate and rhythm, no murmur. Gastrointestinal system: Abdomen is soft, non tender, non distended, BS+ Central nervous system: Alert and oriented x 2, no focal neurological deficits. Extremities: No edema, no cyanosis, no clubbing. Skin: No rashes, lesions or ulcers Psychiatry: Judgement and insight appear normal.  Mood & affect appropriate.     Data Reviewed: I have personally reviewed following labs and imaging studies  CBC: No results for input(s): "WBC", "NEUTROABS", "HGB", "HCT", "MCV", "PLT" in the last 168 hours.  Basic Metabolic Panel: No results for input(s): "NA", "K", "CL", "CO2", "GLUCOSE", "BUN", "CREATININE", "CALCIUM", "MG", "PHOS" in the last 168 hours.  GFR: Estimated Creatinine Clearance: 81.6 mL/min (by C-G formula based on SCr of 0.81 mg/dL). Liver Function Tests: No results for input(s): "AST", "ALT", "ALKPHOS", "BILITOT", "PROT", "ALBUMIN" in the last 168 hours. No results for input(s): "LIPASE", "AMYLASE" in the last 168 hours. No results for input(s): "AMMONIA" in the last 168 hours. Coagulation Profile: No results for input(s): "INR", "PROTIME" in the last 168 hours. Cardiac Enzymes: No results for input(s): "CKTOTAL", "CKMB", "CKMBINDEX", "TROPONINI" in the last 168 hours. BNP (last 3 results) No results for input(s): "PROBNP" in the last 8760 hours. HbA1C: No results for input(s): "HGBA1C" in the last 72 hours. CBG: Recent Labs  Lab 09/05/22 1027  GLUCAP 99   Lipid Profile: No results for input(s): "CHOL", "HDL", "LDLCALC", "TRIG", "CHOLHDL", "LDLDIRECT" in the last 72 hours. Thyroid Function Tests: No results for input(s): "TSH", "T4TOTAL", "FREET4", "T3FREE", "THYROIDAB" in the last 72 hours. Anemia Panel: No results for input(s): "VITAMINB12", "FOLATE", "FERRITIN", "TIBC", "IRON", "RETICCTPCT" in the last 72 hours. Sepsis Labs: No results for input(s): "PROCALCITON", "LATICACIDVEN" in the last 168 hours.  No results found for this or any previous visit (from the past 240 hour(s)).   Radiology Studies: No results found.  Scheduled Meds:  amLODipine  5 mg Oral Daily   atorvastatin  20 mg Oral QHS   divalproex  500 mg Oral Q12H   enoxaparin (LOVENOX) injection  40 mg Subcutaneous Q24H   feeding supplement  237 mL Oral TID BM   melatonin  10 mg Oral  QHS   mirtazapine  15 mg Oral QHS   multivitamin with minerals  1 tablet Oral Daily   polyethylene glycol  17 g Oral BID   QUEtiapine  100 mg Oral QHS   risperiDONE  1 mg Oral BID   senna-docusate  2 tablet Oral BID   tamsulosin  0.4 mg Oral QPC supper   Continuous Infusions:   LOS: 60 days    Time spent: 35 mins    Derrian Rodak, MD [facility]   If 7PM-7AM, please contact night-coverage

## 2022-09-08 NOTE — TOC Progression Note (Signed)
Transition of Care [facility]) - Progression Note    Patient Details  Name: Larry Buckley MRN: [id number] Date of Birth: 1941-01-03  [facility] of [facility] CM/SW Contact  Chapman Fitch, RN Phone Number: 09/08/2022, 4:39 PM  Clinical Narrative:    No bed offers   Expected Discharge Plan: Memory Care Barriers to Discharge: Other (must enter comment) (Facility will not accept patient back)  Expected Discharge Plan and Services Expected Discharge Plan: Memory Care   Discharge Planning Services: CM Consult   Living arrangements for the past 2 months: Group Home                 DME Arranged: N/A DME Agency: NA       HH Arranged: NA HH Agency: NA         Social Determinants of Health (SDOH) Interventions    Readmission Risk Interventions     No data to display

## 2022-09-09 DIAGNOSIS — F03918 Unspecified dementia, unspecified severity, with other behavioral disturbance: Secondary | ICD-10-CM | POA: Diagnosis not present

## 2022-09-09 MED ORDER — RISPERIDONE 1 MG PO TBDP
1.0000 mg | ORAL_TABLET | Freq: Two times a day (BID) | ORAL | Status: DC
  Administered 2022-09-09 – 2022-10-02 (×42): 1 mg via ORAL
  Filled 2022-09-09 (×7): qty 2
  Filled 2022-09-09: qty 1
  Filled 2022-09-09: qty 2
  Filled 2022-09-09 (×2): qty 1
  Filled 2022-09-09 (×2): qty 2
  Filled 2022-09-09 (×2): qty 1
  Filled 2022-09-09: qty 2
  Filled 2022-09-09: qty 1
  Filled 2022-09-09: qty 2
  Filled 2022-09-09 (×3): qty 1
  Filled 2022-09-09 (×2): qty 2
  Filled 2022-09-09: qty 1
  Filled 2022-09-09 (×4): qty 2
  Filled 2022-09-09: qty 1
  Filled 2022-09-09 (×2): qty 2
  Filled 2022-09-09 (×2): qty 1
  Filled 2022-09-09 (×2): qty 2
  Filled 2022-09-09: qty 1
  Filled 2022-09-09: qty 2
  Filled 2022-09-09 (×2): qty 1
  Filled 2022-09-09 (×2): qty 2

## 2022-09-09 NOTE — Plan of Care (Signed)
  Problem: Clinical Measurements: Goal: Respiratory complications will improve Outcome: Progressing   Problem: Nutrition: Goal: Adequate nutrition will be maintained Outcome: Progressing   Problem: Elimination: Goal: Will not experience complications related to urinary retention Outcome: Progressing   Problem: Safety: Goal: Ability to remain free from injury will improve Outcome: Progressing   

## 2022-09-09 NOTE — Progress Notes (Signed)
PROGRESS NOTE    Larry Buckley  VEL:[id number] DOB: 06/30/1941 DOA: 06/17/2022  PCP: Marjie Skiff, NP   Brief Narrative:  This 81 years old male with PMH significant for dementia with behavioral disturbance, hypertension, hyperlipidemia, AAA with repair, gout, who initially presented to [facility] ED after aggressive and combative behavior at the group home.  He was brought into the ED on 07/03/2022 via EMS under IVC.  He has been staying in the ED, seen by psychiatry and social worker, awaiting placement.  Admitted to hospitalist service on 9/19 as patient has increased weakness. Patient has significant agitation, barely eating.  Palliative care consult obtained, currently working with DSS for goal of care discussion.  Patient is now medically clear, awaiting placement.  Assessment & Plan:   Principal Problem:   Dementia with behavioral disturbance (HCC) Active Problems:   Essential hypertension   Hematuria   Protein-calorie malnutrition, severe   Failure to thrive in adult   Anorexia   Central sleep apnea   UTI (urinary tract infection)  Dementia with behavioral problems: Continue Depakote, Remeron, Seroquel, Risperdal. Patient doing well, back to his baseline mental status. There is no recent behavioral problems noted.   Urinary tract infection: Treated and completed antibiotics course. He denies any further urinary symptoms.   Essential hypertension. Blood pressure remains controlled.   Continue amlodipine.  Hematuria > Resolved.  Urinary retention > Resolved. Hematuria resolved, continue Flomax.   Protein calorie malnutrition, severe: Failure to thrive in adult: Continue nutritional support.  Supplements   Central sleep apnea: Continue supportive care. CPAP at night   Hyperlipidemia Continue Lipitor 20 mg daily   Patient has been medically clear, no change in treatment plan. Currently pending placement.   DVT prophylaxis: Lovenox Code Status:Full  code Family Communication: No family at bed side. Disposition Plan:   Status is: Inpatient Remains inpatient appropriate because: Unsafe discharge plan.  TOC is working on finding a placement.    Consultants:  Psychiatry  Procedures:None  Antimicrobials:  Anti-infectives (From admission, onward)    Start     Dose/Rate Route Frequency Ordered Stop   08/31/22 2200  cephALEXin (KEFLEX) capsule 500 mg        500 mg Oral Every 12 hours 08/31/22 0948 09/05/22 0941   08/31/22 0800  cephALEXin (KEFLEX) capsule 500 mg  Status:  Discontinued        500 mg Oral Every 6 hours 08/31/22 0743 08/31/22 0948      Subjective: Patient was seen and examined at bedside.  Overnight events noted. Patient seems better, back to his baseline mental status. He was lying comfortably in the bed, denies any pain.  Objective: Vitals:   09/08/22 0840 09/08/22 2100 09/09/22 0431 09/09/22 0900  BP: 132/87 132/70 129/76 135/85  Pulse: 79 67 62 60  Resp: 18 18 16 15   Temp: 97.7 F (36.5 C) 98.3 F (36.8 C) 97.7 F (36.5 C) 97.6 F (36.4 C)  TempSrc: Oral Oral  Oral  SpO2: 100% 100% 100% 100%  Weight:      Height:        Intake/Output Summary (Last 24 hours) at 09/09/2022 1149 Last data filed at 09/09/2022 0440 Gross per 24 hour  Intake 360 ml  Output 700 ml  Net -340 ml   Filed Weights   07/04/22 2230 07/06/22 1533  Weight: 84 kg 82 kg    Examination:  General exam: Appears comfortable, not in any acute distress, deconditioned. Respiratory system: CTA bilaterally, respiratory effort normal, RR  13. Cardiovascular system: S1-S2 heard, regular rate and rhythm, no murmur. Gastrointestinal system: Abdomen is soft, non tender, non distended, BS+ Central nervous system: Alert and oriented x2, no focal neurological deficits. Extremities: No edema, no cyanosis, no clubbing. Skin: No rashes, lesions or ulcers Psychiatry: Judgement and insight appear normal. Mood & affect appropriate.      Data Reviewed: I have personally reviewed following labs and imaging studies  CBC: No results for input(s): "WBC", "NEUTROABS", "HGB", "HCT", "MCV", "PLT" in the last 168 hours.  Basic Metabolic Panel: No results for input(s): "NA", "K", "CL", "CO2", "GLUCOSE", "BUN", "CREATININE", "CALCIUM", "MG", "PHOS" in the last 168 hours.  GFR: Estimated Creatinine Clearance: 81.6 mL/min (by C-G formula based on SCr of 0.81 mg/dL). Liver Function Tests: No results for input(s): "AST", "ALT", "ALKPHOS", "BILITOT", "PROT", "ALBUMIN" in the last 168 hours. No results for input(s): "LIPASE", "AMYLASE" in the last 168 hours. No results for input(s): "AMMONIA" in the last 168 hours. Coagulation Profile: No results for input(s): "INR", "PROTIME" in the last 168 hours. Cardiac Enzymes: No results for input(s): "CKTOTAL", "CKMB", "CKMBINDEX", "TROPONINI" in the last 168 hours. BNP (last 3 results) No results for input(s): "PROBNP" in the last 8760 hours. HbA1C: No results for input(s): "HGBA1C" in the last 72 hours. CBG: Recent Labs  Lab 09/05/22 1027  GLUCAP 99   Lipid Profile: No results for input(s): "CHOL", "HDL", "LDLCALC", "TRIG", "CHOLHDL", "LDLDIRECT" in the last 72 hours. Thyroid Function Tests: No results for input(s): "TSH", "T4TOTAL", "FREET4", "T3FREE", "THYROIDAB" in the last 72 hours. Anemia Panel: No results for input(s): "VITAMINB12", "FOLATE", "FERRITIN", "TIBC", "IRON", "RETICCTPCT" in the last 72 hours. Sepsis Labs: No results for input(s): "PROCALCITON", "LATICACIDVEN" in the last 168 hours.  No results found for this or any previous visit (from the past 240 hour(s)).   Radiology Studies: No results found.  Scheduled Meds:  amLODipine  5 mg Oral Daily   atorvastatin  20 mg Oral QHS   divalproex  500 mg Oral Q12H   enoxaparin (LOVENOX) injection  40 mg Subcutaneous Q24H   feeding supplement  237 mL Oral TID BM   melatonin  10 mg Oral QHS   mirtazapine  15 mg  Oral QHS   multivitamin with minerals  1 tablet Oral Daily   polyethylene glycol  17 g Oral BID   QUEtiapine  100 mg Oral QHS   risperiDONE  1 mg Oral BID   senna-docusate  2 tablet Oral BID   tamsulosin  0.4 mg Oral QPC supper   Continuous Infusions:   LOS: 61 days    Time spent: 25 mins    Cipriano Bunker, MD [facility]   If 7PM-7AM, please contact night-coverage

## 2022-09-10 DIAGNOSIS — F03918 Unspecified dementia, unspecified severity, with other behavioral disturbance: Secondary | ICD-10-CM | POA: Diagnosis not present

## 2022-09-10 NOTE — Progress Notes (Signed)
PROGRESS NOTE    Larry Buckley  IFO:[id number] DOB: 11-Oct-1941 DOA: 06/26/2022  PCP: Marjie Skiff, NP   Brief Narrative:  This 81 years old male with PMH significant for dementia with behavioral disturbance, hypertension, hyperlipidemia, AAA with repair, gout, who initially presented to [facility] ED after aggressive and combative behavior at the group home.  He was brought into the ED on 07/10/2022 via EMS under IVC.  He has been staying in the ED, seen by psychiatry and social worker, awaiting placement.  Admitted to hospitalist service on 9/19 as patient has increased weakness. Patient had significant agitation, barely eating.  Palliative care consult obtained, currently working with DSS for goal of care discussion.  Patient is now medically clear, awaiting placement. He has good food intake.  Assessment & Plan:   Principal Problem:   Dementia with behavioral disturbance (HCC) Active Problems:   Essential hypertension   Hematuria   Protein-calorie malnutrition, severe   Failure to thrive in adult   Anorexia   Central sleep apnea   UTI (urinary tract infection)  Dementia with behavioral problems: Continue Depakote, Remeron, Seroquel, Risperdal. Patient is doing well, he is back to his baseline mental status. There is no recent behavioral problems noted.   Urinary tract infection: Treated and completed antibiotics course. He denies any further urinary symptoms.   Essential hypertension. Blood pressure remains controlled.   Continue amlodipine.  Hematuria > Resolved.  Urinary retention > Resolved. Hematuria resolved, continue Flomax.   Protein calorie malnutrition, severe: Failure to thrive in adult: Continue nutritional support.  Supplements   Central sleep apnea: Continue supportive care. CPAP at night   Hyperlipidemia Continue Lipitor 20 mg daily   Patient has been medically clear, no change in treatment plan. Currently pending placement.   DVT prophylaxis:  Lovenox Code Status:Full code Family Communication: No family at bed side. Disposition Plan:   Status is: Inpatient Remains inpatient appropriate because: Unsafe discharge plan.  TOC is working on finding a placement.    Consultants:  Psychiatry  Procedures:None  Antimicrobials:  Anti-infectives (From admission, onward)    Start     Dose/Rate Route Frequency Ordered Stop   08/31/22 2200  cephALEXin (KEFLEX) capsule 500 mg        500 mg Oral Every 12 hours 08/31/22 0948 09/05/22 0941   08/31/22 0800  cephALEXin (KEFLEX) capsule 500 mg  Status:  Discontinued        500 mg Oral Every 6 hours 08/31/22 0743 08/31/22 0948      Subjective: Patient was seen and examined at bedside.  Overnight events noted. Patient was sleeping, denies any problems, remains at baseline mental status.  Objective: Vitals:   09/09/22 1627 09/09/22 1937 09/10/22 0358 09/10/22 0910  BP: 138/80 (!) 134/55 131/63 (!) 151/73  Pulse: 84 86 76 67  Resp:  17 16 18   Temp: 98.7 F (37.1 C) 98.1 F (36.7 C) 98.1 F (36.7 C) 97.9 F (36.6 C)  TempSrc: Oral Oral    SpO2: 100% 99% 99% 100%  Weight:      Height:        Intake/Output Summary (Last 24 hours) at 09/10/2022 1121 Last data filed at 09/09/2022 1815 Gross per 24 hour  Intake 480 ml  Output 650 ml  Net -170 ml   Filed Weights   07/04/22 2230 07/06/22 1533  Weight: 84 kg 82 kg    Examination:  General exam: Appears comfortable, not in any acute distress, deconditioned. Respiratory system: CTA bilaterally, respiratory effort  normal, RR 13. Cardiovascular system: S1-S2 heard, regular rate and rhythm, no murmur. Gastrointestinal system: Abdomen is soft, non tender, non distended, BS+ Central nervous system: Alert and oriented x 2, no focal neurological deficits. Extremities: No edema, no cyanosis, no clubbing. Skin: No rashes, lesions or ulcers Psychiatry: Judgement and insight appear normal. Mood & affect appropriate.     Data  Reviewed: I have personally reviewed following labs and imaging studies  CBC: No results for input(s): "WBC", "NEUTROABS", "HGB", "HCT", "MCV", "PLT" in the last 168 hours.  Basic Metabolic Panel: No results for input(s): "NA", "K", "CL", "CO2", "GLUCOSE", "BUN", "CREATININE", "CALCIUM", "MG", "PHOS" in the last 168 hours.  GFR: Estimated Creatinine Clearance: 81.6 mL/min (by C-G formula based on SCr of 0.81 mg/dL). Liver Function Tests: No results for input(s): "AST", "ALT", "ALKPHOS", "BILITOT", "PROT", "ALBUMIN" in the last 168 hours. No results for input(s): "LIPASE", "AMYLASE" in the last 168 hours. No results for input(s): "AMMONIA" in the last 168 hours. Coagulation Profile: No results for input(s): "INR", "PROTIME" in the last 168 hours. Cardiac Enzymes: No results for input(s): "CKTOTAL", "CKMB", "CKMBINDEX", "TROPONINI" in the last 168 hours. BNP (last 3 results) No results for input(s): "PROBNP" in the last 8760 hours. HbA1C: No results for input(s): "HGBA1C" in the last 72 hours. CBG: Recent Labs  Lab 09/05/22 1027  GLUCAP 99   Lipid Profile: No results for input(s): "CHOL", "HDL", "LDLCALC", "TRIG", "CHOLHDL", "LDLDIRECT" in the last 72 hours. Thyroid Function Tests: No results for input(s): "TSH", "T4TOTAL", "FREET4", "T3FREE", "THYROIDAB" in the last 72 hours. Anemia Panel: No results for input(s): "VITAMINB12", "FOLATE", "FERRITIN", "TIBC", "IRON", "RETICCTPCT" in the last 72 hours. Sepsis Labs: No results for input(s): "PROCALCITON", "LATICACIDVEN" in the last 168 hours.  No results found for this or any previous visit (from the past 240 hour(s)).   Radiology Studies: No results found.  Scheduled Meds:  amLODipine  5 mg Oral Daily   atorvastatin  20 mg Oral QHS   divalproex  500 mg Oral Q12H   enoxaparin (LOVENOX) injection  40 mg Subcutaneous Q24H   feeding supplement  237 mL Oral TID BM   melatonin  10 mg Oral QHS   mirtazapine  15 mg Oral QHS    multivitamin with minerals  1 tablet Oral Daily   polyethylene glycol  17 g Oral BID   QUEtiapine  100 mg Oral QHS   risperiDONE  1 mg Oral BID   senna-docusate  2 tablet Oral BID   tamsulosin  0.4 mg Oral QPC supper   Continuous Infusions:   LOS: 62 days    Time spent: 25 mins    Shawna Clamp, MD [facility]   If 7PM-7AM, please contact night-coverage

## 2022-09-11 DIAGNOSIS — F03918 Unspecified dementia, unspecified severity, with other behavioral disturbance: Secondary | ICD-10-CM | POA: Diagnosis not present

## 2022-09-11 NOTE — TOC Progression Note (Addendum)
Transition of Care [facility]) - Progression Note    Patient Details  Name: Larry Buckley MRN: [id number] Date of Birth: Jun 15, 1941  Transition of [facility] CM/SW Contact  Margarito Liner, LCSW Phone Number: 09/11/2022, 11:07 AM  Clinical Narrative:  Still no long-term bed available at [facility].   1:47 pm: Sent SNF referral out to facilities within [place] and surrounding counties to see if any answers change.  Expected Discharge Plan: Memory Care Barriers to Discharge: Other (must enter comment) (Facility will not accept patient back)  Expected Discharge Plan and Services Expected Discharge Plan: Memory Care   Discharge Planning Services: CM Consult   Living arrangements for the past 2 months: Group Home                 DME Arranged: N/A DME Agency: NA       HH Arranged: NA HH Agency: NA         Social Determinants of Health (SDOH) Interventions    Readmission Risk Interventions     No data to display

## 2022-09-11 NOTE — Progress Notes (Signed)
PROGRESS NOTE    Larry Buckley  BOF:[id number] DOB: [birth year] DOA: 07/02/2022  PCP: Marjie Skiff, NP   Brief Narrative:  This 81 years old male with PMH significant for dementia with behavioral disturbance, hypertension, hyperlipidemia, AAA with repair, gout, who initially presented to [facility] ED after aggressive and combative behavior at the group home.  He was brought into the ED on 07/07/2022 via EMS under IVC.  He has been staying in the ED, seen by psychiatry and social worker, awaiting placement.  Admitted to hospitalist service on 9/19 as patient has increased weakness. Patient had significant agitation, barely eating.  Palliative care consult obtained, currently working with DSS for goal of care discussion.  Patient is now medically clear, awaiting placement. He has good food intake.  Assessment & Plan:   Principal Problem:   Dementia with behavioral disturbance (HCC) Active Problems:   Essential hypertension   Hematuria   Protein-calorie malnutrition, severe   Failure to thrive in adult   Anorexia   Central sleep apnea   UTI (urinary tract infection)  Dementia with behavioral problems: Continue Depakote, Remeron, Seroquel, Risperdal. Patient is doing well, he is back to his baseline mental status. There is no recent behavioral problems noted.   Urinary tract infection: Treated and completed antibiotics course. He denies any further urinary symptoms.   Essential hypertension. Blood pressure remains controlled.   Continue amlodipine.  Hematuria > Resolved.  Urinary retention > Resolved. Hematuria resolved, continue Flomax.   Protein calorie malnutrition, severe: Failure to thrive in adult: Continue nutritional support.  Supplements   Central sleep apnea: Continue supportive care. CPAP at night   Hyperlipidemia Continue Lipitor 20 mg daily   Patient has been medically clear, no change in treatment plan. Currently pending placement.   DVT prophylaxis:  Lovenox Code Status:Full code Family Communication: No family at bed side. Disposition Plan:   Status is: Inpatient Remains inpatient appropriate because: Unsafe discharge plan.  TOC is working on finding a placement.    Consultants:  Psychiatry  Procedures:None  Antimicrobials:  Anti-infectives (From admission, onward)    Start     Dose/Rate Route Frequency Ordered Stop   08/31/22 2200  cephALEXin (KEFLEX) capsule 500 mg        500 mg Oral Every 12 hours 08/31/22 0948 09/05/22 0941   08/31/22 0800  cephALEXin (KEFLEX) capsule 500 mg  Status:  Discontinued        500 mg Oral Every 6 hours 08/31/22 0743 08/31/22 0948      Subjective: Patient was seen and examined at bedside.  Overnight events noted. Patient was sleeping , denies any problems. He remains at his baseline mental status.  Objective: Vitals:   09/10/22 1728 09/10/22 2007 09/11/22 0308 09/11/22 0943  BP: (!) 141/62 138/70 112/65 125/62  Pulse: 86 79 65 (!) 54  Resp: 18 18 16 18   Temp: 97.9 F (36.6 C) 98.6 F (37 C) 97.6 F (36.4 C) 97.9 F (36.6 C)  TempSrc:      SpO2: 99% 100% 100% 100%  Weight:      Height:        Intake/Output Summary (Last 24 hours) at 09/11/2022 1158 Last data filed at 09/11/2022 0500 Gross per 24 hour  Intake 120 ml  Output 400 ml  Net -280 ml   Filed Weights   07/04/22 2230 07/06/22 1533  Weight: 84 kg 82 kg    Examination:  General exam: Appears comfortable, not in any acute distress, deconditioned. Respiratory system: CTA  bilaterally, respiratory effort normal, RR 13. Cardiovascular system: S1-S2 heard, regular rate and rhythm, no murmur. Gastrointestinal system: Abdomen is soft, non tender, non distended, BS+ Central nervous system: Alert and oriented x 2, no focal neurological deficits. Extremities: No edema, no cyanosis, no clubbing. Skin: No rashes, lesions or ulcers Psychiatry: Judgement and insight appear normal. Mood & affect appropriate.     Data  Reviewed: I have personally reviewed following labs and imaging studies  CBC: No results for input(s): "WBC", "NEUTROABS", "HGB", "HCT", "MCV", "PLT" in the last 168 hours.  Basic Metabolic Panel: No results for input(s): "NA", "K", "CL", "CO2", "GLUCOSE", "BUN", "CREATININE", "CALCIUM", "MG", "PHOS" in the last 168 hours.  GFR: Estimated Creatinine Clearance: 81.6 mL/min (by C-G formula based on SCr of 0.81 mg/dL). Liver Function Tests: No results for input(s): "AST", "ALT", "ALKPHOS", "BILITOT", "PROT", "ALBUMIN" in the last 168 hours. No results for input(s): "LIPASE", "AMYLASE" in the last 168 hours. No results for input(s): "AMMONIA" in the last 168 hours. Coagulation Profile: No results for input(s): "INR", "PROTIME" in the last 168 hours. Cardiac Enzymes: No results for input(s): "CKTOTAL", "CKMB", "CKMBINDEX", "TROPONINI" in the last 168 hours. BNP (last 3 results) No results for input(s): "PROBNP" in the last 8760 hours. HbA1C: No results for input(s): "HGBA1C" in the last 72 hours. CBG: Recent Labs  Lab 09/05/22 1027  GLUCAP 99   Lipid Profile: No results for input(s): "CHOL", "HDL", "LDLCALC", "TRIG", "CHOLHDL", "LDLDIRECT" in the last 72 hours. Thyroid Function Tests: No results for input(s): "TSH", "T4TOTAL", "FREET4", "T3FREE", "THYROIDAB" in the last 72 hours. Anemia Panel: No results for input(s): "VITAMINB12", "FOLATE", "FERRITIN", "TIBC", "IRON", "RETICCTPCT" in the last 72 hours. Sepsis Labs: No results for input(s): "PROCALCITON", "LATICACIDVEN" in the last 168 hours.  No results found for this or any previous visit (from the past 240 hour(s)).   Radiology Studies: No results found.  Scheduled Meds:  amLODipine  5 mg Oral Daily   atorvastatin  20 mg Oral QHS   divalproex  500 mg Oral Q12H   enoxaparin (LOVENOX) injection  40 mg Subcutaneous Q24H   feeding supplement  237 mL Oral TID BM   melatonin  10 mg Oral QHS   mirtazapine  15 mg Oral QHS    multivitamin with minerals  1 tablet Oral Daily   polyethylene glycol  17 g Oral BID   QUEtiapine  100 mg Oral QHS   risperiDONE  1 mg Oral BID   senna-docusate  2 tablet Oral BID   tamsulosin  0.4 mg Oral QPC supper   Continuous Infusions:   LOS: 63 days    Time spent: 25 mins    Cipriano Bunker, MD [facility]   If 7PM-7AM, please contact night-coverage

## 2022-09-11 NOTE — Progress Notes (Signed)
Occupational Therapy Treatment Patient Details Name: Larry Buckley MRN: [id number] DOB: May 22, 1941 Today's Date: 09/11/2022   History of present illness 81 years old male with PMH significant for dementia with behavioral disturbance, hypertension, hyperlipidemia, AAA with repair, gout, who initially presented to [facility] ED after aggressive and combative behavior at the group home.  He was brought into the ED on 07/10/2022 via EMS under IVC.  He has been staying in the ED, seen by psychiatry and social worker, awaiting placement.  Admitted to hospitalist service on 9/19 as patient has increased weakness.  Patient had significant agitation, barely eating.  Palliative care consult obtained, currently working with DSS for goal of care discussion.   OT comments  Larry Buckley was seen for OT treatment on this date. Upon arrival to room pt attempting to ext bed, when encouraged to stand with assistance pt states "I dont need to stand." MAX A + RW sit<>stand, pt clears rear, noted to have had BM. Returned to bed, TOTAL A pericare and rolling L +R. Pt making limited progress toward goals, cognition limiting ability to make progress at this time. Will sign off. Discharge recommendation remains appropriate.     Recommendations for follow up therapy are one component of a multi-disciplinary discharge planning process, led by the attending physician.  Recommendations may be updated based on patient status, additional functional criteria and insurance authorization.    Follow Up Recommendations  Long-term institutional care without follow-up therapy     Assistance Recommended at Discharge Frequent or constant Supervision/Assistance  Patient can return home with the following  A lot of help with walking and/or transfers;Assist for transportation;Direct supervision/assist for medications management;Direct supervision/assist for financial management;Assistance with cooking/housework;A lot of help with  bathing/dressing/bathroom   Equipment Recommendations  Other (comment) (defer)    Recommendations for Other Services      Precautions / Restrictions Precautions Precautions: Fall Restrictions Weight Bearing Restrictions: No       Mobility Bed Mobility Overal bed mobility: Needs Assistance Bed Mobility: Rolling, Sit to Supine Rolling: Max assist     Sit to supine: Mod assist        Transfers Overall transfer level: Needs assistance Equipment used: Rolling walker (2 wheels) Transfers: Sit to/from Stand Sit to Stand: Max assist           General transfer comment: clears read but unable to stand - appears limited by cognition     Balance Overall balance assessment: Needs assistance Sitting-balance support: Feet supported, Single extremity supported Sitting balance-Leahy Scale: Fair                                     ADL either performed or assessed with clinical judgement   ADL Overall ADL's : Needs assistance/impaired                                       General ADL Comments: TOTAL A x2 for toileting at bed level      Cognition Arousal/Alertness: Awake/alert Behavior During Therapy: Flat affect Overall Cognitive Status: History of cognitive impairments - at baseline                                 General Comments: unable to follow 1 step commands  Pertinent Vitals/ Pain       Pain Assessment Pain Assessment: No/denies pain   Frequency  Min 1X/week        Progress Toward Goals  OT Goals(current goals can now be found in the care plan section)  Progress towards OT goals: Progressing toward goals  Acute Rehab OT Goals Patient Stated Goal: none stated OT Goal Formulation: Patient unable to participate in goal setting Time For Goal Achievement: 09/14/22 Potential to Achieve Goals: Poor ADL Goals Pt Will Perform Grooming: with set-up;with supervision;standing Pt Will  Perform Lower Body Dressing: with min assist;sit to/from stand Pt Will Transfer to Toilet: with min assist;ambulating;bedside commode  Plan Discharge plan remains appropriate;Frequency needs to be updated;Other (comment) (pt not making progress towards goals, will d/c from caseload)    Co-evaluation                 AM-PAC OT "6 Clicks" Daily Activity     Outcome Measure   Help from another person eating meals?: A Little Help from another person taking care of personal grooming?: A Lot Help from another person toileting, which includes using toliet, bedpan, or urinal?: A Lot Help from another person bathing (including washing, rinsing, drying)?: A Lot Help from another person to put on and taking off regular upper body clothing?: A Little Help from another person to put on and taking off regular lower body clothing?: A Lot 6 Click Score: 14    End of Session Equipment Utilized During Treatment: Rolling walker (2 wheels)  OT Visit Diagnosis: Other abnormalities of gait and mobility (R26.89)   Activity Tolerance Patient tolerated treatment well   Patient Left in bed;with bed alarm set;with call bell/phone within reach   Nurse Communication Mobility status        Time: 1440-1456 OT Time Calculation (min): 16 min  Charges: OT General Charges $OT Visit: 1 Visit OT Treatments $Self Care/Home Management : 8-22 mins  Kathie Dike, M.S. OTR/L  09/11/22, 3:22 PM  ascom [phone]

## 2022-09-12 DIAGNOSIS — F03918 Unspecified dementia, unspecified severity, with other behavioral disturbance: Secondary | ICD-10-CM | POA: Diagnosis not present

## 2022-09-12 NOTE — Progress Notes (Signed)
PROGRESS NOTE    Larry Buckley  UMP:[id number] DOB: Oct 31, 1940 DOA: 07/14/2022  PCP: Marjie Skiff, NP   Brief Narrative:  This 81 years old male with PMH significant for dementia with behavioral disturbance, hypertension, hyperlipidemia, AAA with repair, gout, who initially presented to [facility] ED after aggressive and combative behavior at the group home.  He was brought into the ED on 06/23/2022 via EMS under IVC.  He has been staying in the ED, seen by psychiatry and social worker, awaiting placement.  Admitted to hospitalist service on 9/19 as patient has increased weakness. Patient had significant agitation, barely eating.  Palliative care consult obtained, currently working with DSS for goal of care discussion.  Patient is now medically clear, awaiting placement. He has good food intake.  Assessment & Plan:   Principal Problem:   Dementia with behavioral disturbance (HCC) Active Problems:   Essential hypertension   Hematuria   Protein-calorie malnutrition, severe   Failure to thrive in adult   Anorexia   Central sleep apnea   UTI (urinary tract infection)  Dementia with behavioral problems: Continue Depakote, Remeron, Seroquel, Risperdal. Patient is doing well, he is back to his baseline mental status. There is no recent behavioral problems noted.   Urinary tract infection: Treated and completed antibiotics course. He denies any further urinary symptoms.   Essential hypertension. Blood pressure remains controlled.   Continue amlodipine.  Hematuria > Resolved.  Urinary retention > Resolved. Hematuria resolved, continue Flomax.   Protein calorie malnutrition, severe: Failure to thrive in adult: Continue nutritional support.  Supplements   Central sleep apnea: Continue supportive care. CPAP at night   Hyperlipidemia Continue Lipitor 20 mg daily   Patient has been medically clear, no change in treatment plan. Currently pending placement.   DVT prophylaxis:  Lovenox Code Status:Full code Family Communication: No family at bed side. Disposition Plan:   Status is: Inpatient Remains inpatient appropriate because: Unsafe discharge plan.  TOC is working on finding a placement.    Consultants:  Psychiatry  Procedures:None  Antimicrobials:  Anti-infectives (From admission, onward)    Start     Dose/Rate Route Frequency Ordered Stop   08/31/22 2200  cephALEXin (KEFLEX) capsule 500 mg        500 mg Oral Every 12 hours 08/31/22 0948 09/05/22 0941   08/31/22 0800  cephALEXin (KEFLEX) capsule 500 mg  Status:  Discontinued        500 mg Oral Every 6 hours 08/31/22 0743 08/31/22 0948      Subjective: Patient was seen and examined at bedside.  Overnight events noted. He remains at his baseline mental status, denies any concerns.  Objective: Vitals:   09/11/22 1626 09/11/22 2014 09/12/22 0415 09/12/22 0808  BP: 124/65 133/67 138/68 130/62  Pulse: 68 70 66 (!) 57  Resp: 16 16 16 18   Temp: 97.8 F (36.6 C) 98.6 F (37 C) (!) 97.4 F (36.3 C) 98.3 F (36.8 C)  TempSrc: Oral  Oral   SpO2: 98% 97% 97% 99%  Weight:      Height:        Intake/Output Summary (Last 24 hours) at 09/12/2022 1211 Last data filed at 09/11/2022 1839 Gross per 24 hour  Intake 480 ml  Output --  Net 480 ml   Filed Weights   07/04/22 2230 07/06/22 1533  Weight: 84 kg 82 kg    Examination:  General exam: Appears comfortable, not in any distress, Deconditioned. Respiratory system: CTA bilaterally, respiratory effort normal, RR 12.  Cardiovascular system: S1-S2 heard, regular rate and rhythm, no murmur. Gastrointestinal system: Abdomen is soft, non tender, non distended. BS+ Central nervous system: Alert and oriented x 2, no focal neurological deficits. Extremities: No edema, no cyanosis, no clubbing. Skin: No rashes, lesions or ulcers Psychiatry: Judgement and insight appear normal. Mood & affect appropriate.     Data Reviewed: I have personally  reviewed following labs and imaging studies  CBC: No results for input(s): "WBC", "NEUTROABS", "HGB", "HCT", "MCV", "PLT" in the last 168 hours.  Basic Metabolic Panel: No results for input(s): "NA", "K", "CL", "CO2", "GLUCOSE", "BUN", "CREATININE", "CALCIUM", "MG", "PHOS" in the last 168 hours.  GFR: Estimated Creatinine Clearance: 81.6 mL/min (by C-G formula based on SCr of 0.81 mg/dL). Liver Function Tests: No results for input(s): "AST", "ALT", "ALKPHOS", "BILITOT", "PROT", "ALBUMIN" in the last 168 hours. No results for input(s): "LIPASE", "AMYLASE" in the last 168 hours. No results for input(s): "AMMONIA" in the last 168 hours. Coagulation Profile: No results for input(s): "INR", "PROTIME" in the last 168 hours. Cardiac Enzymes: No results for input(s): "CKTOTAL", "CKMB", "CKMBINDEX", "TROPONINI" in the last 168 hours. BNP (last 3 results) No results for input(s): "PROBNP" in the last 8760 hours. HbA1C: No results for input(s): "HGBA1C" in the last 72 hours. CBG: No results for input(s): "GLUCAP" in the last 168 hours.  Lipid Profile: No results for input(s): "CHOL", "HDL", "LDLCALC", "TRIG", "CHOLHDL", "LDLDIRECT" in the last 72 hours. Thyroid Function Tests: No results for input(s): "TSH", "T4TOTAL", "FREET4", "T3FREE", "THYROIDAB" in the last 72 hours. Anemia Panel: No results for input(s): "VITAMINB12", "FOLATE", "FERRITIN", "TIBC", "IRON", "RETICCTPCT" in the last 72 hours. Sepsis Labs: No results for input(s): "PROCALCITON", "LATICACIDVEN" in the last 168 hours.  No results found for this or any previous visit (from the past 240 hour(s)).   Radiology Studies: No results found.  Scheduled Meds:  amLODipine  5 mg Oral Daily   atorvastatin  20 mg Oral QHS   divalproex  500 mg Oral Q12H   enoxaparin (LOVENOX) injection  40 mg Subcutaneous Q24H   feeding supplement  237 mL Oral TID BM   melatonin  10 mg Oral QHS   mirtazapine  15 mg Oral QHS   multivitamin with  minerals  1 tablet Oral Daily   polyethylene glycol  17 g Oral BID   QUEtiapine  100 mg Oral QHS   risperiDONE  1 mg Oral BID   senna-docusate  2 tablet Oral BID   tamsulosin  0.4 mg Oral QPC supper   Continuous Infusions:   LOS: 64 days    Time spent: 25 mins    Cipriano Bunker, MD [facility]   If 7PM-7AM, please contact night-coverage

## 2022-09-12 NOTE — Progress Notes (Addendum)
Physical Therapy Treatment Patient Details Name: Larry Buckley MRN: [id number] DOB: 12-28-1940 Today's Date: 09/12/2022   History of Present Illness 81 years old male with PMH significant for dementia with behavioral disturbance, hypertension, hyperlipidemia, AAA with repair, gout, who initially presented to [facility] ED after aggressive and combative behavior at the group home.  He was brought into the ED on 06/23/2022 via EMS under IVC.  He has been staying in the ED, seen by psychiatry and social worker, awaiting placement.  Admitted to hospitalist service on 9/19 as patient has increased weakness.  Patient had significant agitation, barely eating.  Palliative care consult obtained, currently working with DSS for goal of care discussion.    PT Comments    Pt placed on two week PT trial, however in review of notes, pt not deemed to have made progress towards goals. Pt continues to have difficulty following commands, very limited by cognition. Pt with untouched lunch tray infront of him as he was sleeping. This writer woke him up and offered to sit up for lunch. Pt reports he was hungry. Assisted with feeding, then called CNA to assist. Pt continues to be max A +2 for all mobility. This patient is appropriate for mobility specialist and RN staff to perform mobility. Plan to sign off on PT as pt is at baseline level. Currently recommend LTC at discharge.  Recommendations for follow up therapy are one component of a multi-disciplinary discharge planning process, led by the attending physician.  Recommendations may be updated based on patient status, additional functional criteria and insurance authorization.  Follow Up Recommendations  Long-term institutional care without follow-up therapy Can patient physically be transported by private vehicle: No   Assistance Recommended at Discharge Frequent or constant Supervision/Assistance  Patient can return home with the following Two people to help with  walking and/or transfers;Two people to help with bathing/dressing/bathroom   Equipment Recommendations  None recommended by PT    Recommendations for Other Services       Precautions / Restrictions Precautions Precautions: Fall Restrictions Weight Bearing Restrictions: No     Mobility  Bed Mobility Overal bed mobility: Needs Assistance             General bed mobility comments: pt seated in bed with lunch, mobility not performed this session    Transfers                        Ambulation/Gait                   Stairs             Wheelchair Mobility    Modified Rankin (Stroke Patients Only)       Balance                                            Cognition Arousal/Alertness:  (sleepy) Behavior During Therapy: Flat affect Overall Cognitive Status: History of cognitive impairments - at baseline                                 General Comments: initially sleeping, however wakes up to verbal cues. Baseline confusion        Exercises Other Exercises Other Exercises: attempted supine ther-ex, unable to follow commands, although does spontaneous movement. When  therapist assist with ther-ex, pt doesn't participate. PROM performed Other Exercises: lunch tray untouched. Assisted with self feeding. Utensil placed in L hand with pt unable to coordinate feeding. This therapist assisted with bringing food to mouth.    General Comments        Pertinent Vitals/Pain Pain Assessment Pain Assessment: No/denies pain    Home Living                          Prior Function            PT Goals (current goals can now be found in the care plan section) Acute Rehab PT Goals Patient Stated Goal: unable to state PT Goal Formulation: All assessment and education complete, DC therapy Time For Goal Achievement: 09/13/22 Potential to Achieve Goals: Poor Progress towards PT goals: Goals met/education  completed, patient discharged from PT    Frequency           PT Plan Frequency needs to be updated    Co-evaluation              AM-PAC PT "6 Clicks" Mobility   Outcome Measure  Help needed turning from your back to your side while in a flat bed without using bedrails?: A Lot Help needed moving from lying on your back to sitting on the side of a flat bed without using bedrails?: A Lot Help needed moving to and from a bed to a chair (including a wheelchair)?: Total Help needed standing up from a chair using your arms (e.g., wheelchair or bedside chair)?: Total Help needed to walk in hospital room?: Total Help needed climbing 3-5 steps with a railing? : Total 6 Click Score: 8    End of Session   Activity Tolerance: Patient limited by fatigue Patient left: in bed;with bed alarm set Nurse Communication: Mobility status PT Visit Diagnosis: Unsteadiness on feet (R26.81);Muscle weakness (generalized) (M62.81);Difficulty in walking, not elsewhere classified (R26.2)     Time: 0100-7121 PT Time Calculation (min) (ACUTE ONLY): 8 min  Charges:  $Therapeutic Activity: 8-22 mins                     Larry Buckley, PT, DPT, GCS [phone]    Larry Buckley 09/12/2022, 4:07 PM

## 2022-09-12 NOTE — Progress Notes (Addendum)
Mobility Specialist - Progress Note   09/12/22 1555  Mobility  Activity Transferred from bed to chair  Level of Assistance Maximum assist, patient does 25-49%  Assistive Device Front wheel walker  Distance Ambulated (ft) 4 ft  Activity Response Tolerated well  Mobility Referral Yes  $Mobility charge 1 Mobility   Pt in fowler position upon entry, utilizing RA. Pt completed bed mob ModA to sit EOB, STS x2 to RW MaxA +2. Pt maintained flexed posture while standing throughout activity despite multiple attempts to correct. Pt ambulated 4 ft to the recliner, max VC for hand placement on RW and redirection to task. Pt left in recliner with alarm set and needs within reach. RN present upon MS exit.  Zetta Bills Mobility Specialist 09/12/22 4:01 PM

## 2022-09-12 NOTE — NC FL2 (Signed)
[place] MEDICAID FL2 LEVEL OF CARE SCREENING TOOL     IDENTIFICATION  Patient Name: Larry Buckley Birthdate: 08-23-1941 Sex: male Admission Date (Current Location): 05-Jul-2022  [facility] and [place] Number:  Chiropodist and Address:  [facility], [address] [place], [place] [address]      Provider Number: [id number]  Attending Physician Name and Address:  Cipriano Bunker, MD  Relative Name and Phone Number:  Sharlyne Cai - [phone]    Current Level of Care: Hospital Recommended Level of Care: Skilled Nursing Facility Prior Approval Number:    Date Approved/Denied:   PASRR Number: [id number]  Discharge Plan: SNF    Current Diagnoses: Patient Active Problem List   Diagnosis Date Noted   UTI (urinary tract infection) 08/31/2022   Hematuria 07/16/2022   Central sleep apnea 07/11/2022   Acute urinary retention 07/11/2022   Protein-calorie malnutrition, severe 07/07/2022   Anorexia 07/05/2022   Failure to thrive in adult 07/04/2022   Acute pain of right shoulder 06/08/2022   Acute hip pain, left 06/08/2022   Low hemoglobin 04/26/2022   Edema of both legs 01/27/2022   Prediabetes 01/04/2022   Dementia with behavioral disturbance (HCC) 12/29/2021   History of abdominal aortic aneurysm (AAA) repair 09/25/2021   B12 deficiency 07/11/2021   Hyperlipidemia, mixed    Essential hypertension    Gout, arthropathy     Orientation RESPIRATION BLADDER Height & Weight     Self  Normal Incontinent, External catheter Weight: 180 lb 12.4 oz (82 kg) Height:  6\' 2"  (188 cm)  BEHAVIORAL SYMPTOMS/MOOD NEUROLOGICAL BOWEL NUTRITION STATUS   (Behaviors improved. Still has some restlessness at times.)  (Dementia) Incontinent Diet (DYS 3)  AMBULATORY STATUS COMMUNICATION OF NEEDS Skin   Extensive Assist Verbally Normal                       Personal Care Assistance Level of Assistance  Bathing, Feeding, Dressing Bathing Assistance:  Maximum assistance Feeding assistance: Limited assistance Dressing Assistance: Maximum assistance     Functional Limitations Info  Sight, Hearing, Speech Sight Info: Adequate Hearing Info: Adequate Speech Info: Adequate    SPECIAL CARE FACTORS FREQUENCY                 Contractures Contractures Info: Not present    Additional Factors Info  Code Status, Allergies Code Status Info: Full code Allergies Info: Lotensin (Benazepril Hcl)           Current Medications (09/12/2022):  This is the current hospital active medication list Current Facility-Administered Medications  Medication Dose Route Frequency Provider Last Rate Last Admin   acetaminophen (TYLENOL) tablet 650 mg  650 mg Oral Q6H PRN 09/14/2022 N, DO   650 mg at 09/08/22 2308   amLODipine (NORVASC) tablet 5 mg  5 mg Oral Daily 09/10/22 F, NP   5 mg at 09/12/22 0914   atorvastatin (LIPITOR) tablet 20 mg  20 mg Oral QHS 09/14/22 F, NP   20 mg at 09/11/22 2156   divalproex (DEPAKOTE SPRINKLE) capsule 500 mg  500 mg Oral Q12H 2157, RPH   500 mg at 09/12/22 0914   enoxaparin (LOVENOX) injection 40 mg  40 mg Subcutaneous Q24H 09/14/22, MD   40 mg at 09/11/22 2154   feeding supplement (ENSURE ENLIVE / ENSURE PLUS) liquid 237 mL  237 mL Oral TID BM 2155, MD   237 mL at 09/12/22 [phone]  guaiFENesin (ROBITUSSIN) 100 MG/5ML liquid 5 mL  5 mL Oral Q4H PRN Amin, Ankit Chirag, MD       hydrALAZINE (APRESOLINE) injection 10 mg  10 mg Intravenous Q4H PRN Amin, Ankit Chirag, MD       ipratropium-albuterol (DUONEB) 0.5-2.5 (3) MG/3ML nebulizer solution 3 mL  3 mL Nebulization Q4H PRN Amin, Ankit Chirag, MD       melatonin tablet 10 mg  10 mg Oral QHS Waldon Merl F, NP   10 mg at 09/11/22 2156   mirtazapine (REMERON) tablet 15 mg  15 mg Oral QHS Waldon Merl F, NP   15 mg at 09/11/22 2156   multivitamin with minerals tablet 1 tablet  1 tablet Oral Daily Sharen Hones, MD   1 tablet at  09/12/22 [id number]   Oral care mouth rinse  15 mL Mouth Rinse PRN Sharen Hones, MD       polyethylene glycol (MIRALAX / GLYCOLAX) packet 17 g  17 g Oral BID Sharen Hones, MD   17 g at 09/12/22 0914   QUEtiapine (SEROQUEL) tablet 100 mg  100 mg Oral QHS Clapacs, John T, MD   100 mg at 09/11/22 2155   risperiDONE (RISPERDAL M-TABS) disintegrating tablet 1 mg  1 mg Oral BID Lorin Picket, RPH   1 mg at 09/11/22 2224   senna-docusate (Senokot-S) tablet 2 tablet  2 tablet Oral BID Sharen Hones, MD   2 tablet at 09/12/22 0914   tamsulosin (FLOMAX) capsule 0.4 mg  0.4 mg Oral QPC supper Lorella Nimrod, MD   0.4 mg at 09/11/22 1719   traZODone (DESYREL) tablet 50 mg  50 mg Oral QHS PRN Damita Lack, MD   50 mg at 09/10/22 2054     Discharge Medications: Please see discharge summary for a list of discharge medications.  Relevant Imaging Results:  Relevant Lab Results:   Additional Information SS#: [id number]  Candie Chroman, LCSW

## 2022-09-13 DIAGNOSIS — F03918 Unspecified dementia, unspecified severity, with other behavioral disturbance: Secondary | ICD-10-CM | POA: Diagnosis not present

## 2022-09-13 NOTE — TOC Progression Note (Addendum)
Transition of Care [facility]) - Progression Note    Patient Details  Name: ALVAN CULPEPPER MRN: [id number] Date of Birth: 02-24-1941  [facility] CM/SW Contact  Margarito Liner, LCSW Phone Number: 09/13/2022, 9:50 AM  Clinical Narrative:  No bed at [facility]. Contacted DSS to see what date his Medicaid application was started. Also asked if DSS is managing his estate funds or the lawyer.   12:53 pm: [place], [place], and [address] [place] will review referral. Left voicemail for [employer].  1:24 pm: [facility] doesn't have any beds on their memory care unit right now but will update CSW if something opens up.  Expected Discharge Plan: Memory Care Barriers to Discharge: Other (must enter comment) (Facility will not accept patient back)  Expected Discharge Plan and Services Expected Discharge Plan: Memory Care   Discharge Planning Services: CM Consult   Living arrangements for the past 2 months: Group Home                 DME Arranged: N/A DME Agency: NA       HH Arranged: NA HH Agency: NA         Social Determinants of Health (SDOH) Interventions    Readmission Risk Interventions     No data to display

## 2022-09-13 NOTE — Plan of Care (Signed)
Patient alert to self. HE is sitting comfortably in bed. No complaints. Pain score 0. Bed in low locked position. Bed alarm on. Will continue to monitor. Problem: Clinical Measurements: Goal: Ability to maintain clinical measurements within normal limits will improve Outcome: Progressing Goal: Will remain free from infection Outcome: Progressing Goal: Diagnostic test results will improve Outcome: Progressing Goal: Respiratory complications will improve Outcome: Progressing Goal: Cardiovascular complication will be avoided Outcome: Progressing   Problem: Activity: Goal: Risk for activity intolerance will decrease Outcome: Progressing

## 2022-09-13 NOTE — Progress Notes (Signed)
PROGRESS NOTE    Larry Buckley  WCB:[id number] DOB: Oct 26, 1940 DOA: 07/10/2022  PCP: Marjie Skiff, NP   Brief Narrative:  This 81 years old male with PMH significant for dementia with behavioral disturbance, hypertension, hyperlipidemia, AAA with repair, gout, who initially presented to [facility] ED after aggressive and combative behavior at the group home.  He was brought into the ED on 2022-07-10 via EMS under IVC.  He has been staying in the ED, seen by psychiatry and social worker, awaiting placement.  Admitted to hospitalist service on 9/19 as patient has increased weakness. Patient had significant agitation, barely eating.  Palliative care consult obtained, currently working with DSS for goal of care discussion.  Patient is now medically clear, awaiting placement. He has good food intake.  Assessment & Plan:   Principal Problem:   Dementia with behavioral disturbance (HCC) Active Problems:   Essential hypertension   Hematuria   Protein-calorie malnutrition, severe   Failure to thrive in adult   Anorexia   Central sleep apnea   UTI (urinary tract infection)  Dementia with behavioral problems: Continue Depakote, Remeron, Seroquel, Risperdal. Patient is doing well, he is back to his baseline mental status. There is no recent behavioral problems noted.   Urinary tract infection: Treated and completed antibiotics course. He denies any further urinary symptoms.   Essential hypertension. Blood pressure remains controlled.   Continue amlodipine.  Hematuria > Resolved.  Urinary retention > Resolved. Hematuria resolved, continue Flomax.   Protein calorie malnutrition, severe: Failure to thrive in adult: Continue nutritional support.  Supplements   Central sleep apnea: Continue supportive care. CPAP at night   Hyperlipidemia Continue Lipitor 20 mg daily   Patient has been medically clear, no change in treatment plan. Currently pending placement.   DVT prophylaxis:  Lovenox Code Status:Full code Family Communication: No family at bed side. Disposition Plan:   Status is: Inpatient Remains inpatient appropriate because: Unsafe discharge plan.  TOC is working on finding a placement.    Consultants:  Psychiatry  Procedures:None  Antimicrobials:  Anti-infectives (From admission, onward)    Start     Dose/Rate Route Frequency Ordered Stop   08/31/22 2200  cephALEXin (KEFLEX) capsule 500 mg        500 mg Oral Every 12 hours 08/31/22 0948 09/05/22 0941   08/31/22 0800  cephALEXin (KEFLEX) capsule 500 mg  Status:  Discontinued        500 mg Oral Every 6 hours 08/31/22 0743 08/31/22 0948      Subjective: Patient was seen and examined at bedside.  Overnight events noted. He remains at his baseline mental status, denies any concerns.  Objective: Vitals:   09/12/22 1631 09/12/22 1955 09/13/22 0457 09/13/22 0856  BP: 134/76 129/75 (!) 147/79 136/65  Pulse: 88 95 70 69  Resp: 18 20 20 20   Temp:  98.3 F (36.8 C) 97.8 F (36.6 C) 98.2 F (36.8 C)  TempSrc:  Oral Oral   SpO2: 96% 99% (!) 83% 100%  Weight:      Height:        Intake/Output Summary (Last 24 hours) at 09/13/2022 1152 Last data filed at 09/13/2022 0026 Gross per 24 hour  Intake 0 ml  Output --  Net 0 ml   Filed Weights   07/04/22 2230 07/06/22 1533  Weight: 84 kg 82 kg    Examination:  General exam: Appears comfortable, not in any acute distress, deconditioned. Respiratory system: CTA bilaterally, respiratory effort normal, RR 15. Cardiovascular system:  S1-S2 heard, regular rate and rhythm, no murmur. Gastrointestinal system: Abdomen is soft, non tender, non distended, BS+ Central nervous system: Alert and oriented x 2, no focal neurological deficits. Extremities: No edema, no cyanosis, no clubbing. Skin: No rashes, lesions or ulcers Psychiatry: Judgement and insight appear normal. Mood & affect appropriate.     Data Reviewed: I have personally reviewed  following labs and imaging studies  CBC: No results for input(s): "WBC", "NEUTROABS", "HGB", "HCT", "MCV", "PLT" in the last 168 hours.  Basic Metabolic Panel: No results for input(s): "NA", "K", "CL", "CO2", "GLUCOSE", "BUN", "CREATININE", "CALCIUM", "MG", "PHOS" in the last 168 hours.  GFR: Estimated Creatinine Clearance: 81.6 mL/min (by C-G formula based on SCr of 0.81 mg/dL). Liver Function Tests: No results for input(s): "AST", "ALT", "ALKPHOS", "BILITOT", "PROT", "ALBUMIN" in the last 168 hours. No results for input(s): "LIPASE", "AMYLASE" in the last 168 hours. No results for input(s): "AMMONIA" in the last 168 hours. Coagulation Profile: No results for input(s): "INR", "PROTIME" in the last 168 hours. Cardiac Enzymes: No results for input(s): "CKTOTAL", "CKMB", "CKMBINDEX", "TROPONINI" in the last 168 hours. BNP (last 3 results) No results for input(s): "PROBNP" in the last 8760 hours. HbA1C: No results for input(s): "HGBA1C" in the last 72 hours. CBG: No results for input(s): "GLUCAP" in the last 168 hours.  Lipid Profile: No results for input(s): "CHOL", "HDL", "LDLCALC", "TRIG", "CHOLHDL", "LDLDIRECT" in the last 72 hours. Thyroid Function Tests: No results for input(s): "TSH", "T4TOTAL", "FREET4", "T3FREE", "THYROIDAB" in the last 72 hours. Anemia Panel: No results for input(s): "VITAMINB12", "FOLATE", "FERRITIN", "TIBC", "IRON", "RETICCTPCT" in the last 72 hours. Sepsis Labs: No results for input(s): "PROCALCITON", "LATICACIDVEN" in the last 168 hours.  No results found for this or any previous visit (from the past 240 hour(s)).   Radiology Studies: No results found.  Scheduled Meds:  amLODipine  5 mg Oral Daily   atorvastatin  20 mg Oral QHS   divalproex  500 mg Oral Q12H   enoxaparin (LOVENOX) injection  40 mg Subcutaneous Q24H   feeding supplement  237 mL Oral TID BM   melatonin  10 mg Oral QHS   mirtazapine  15 mg Oral QHS   multivitamin with minerals   1 tablet Oral Daily   polyethylene glycol  17 g Oral BID   QUEtiapine  100 mg Oral QHS   risperiDONE  1 mg Oral BID   senna-docusate  2 tablet Oral BID   tamsulosin  0.4 mg Oral QPC supper   Continuous Infusions:   LOS: 65 days    Time spent: 25 mins    Cipriano Bunker, MD [facility]   If 7PM-7AM, please contact night-coverage

## 2022-09-14 DIAGNOSIS — I10 Essential (primary) hypertension: Secondary | ICD-10-CM | POA: Diagnosis not present

## 2022-09-14 DIAGNOSIS — R627 Adult failure to thrive: Secondary | ICD-10-CM | POA: Diagnosis not present

## 2022-09-14 DIAGNOSIS — F03918 Unspecified dementia, unspecified severity, with other behavioral disturbance: Secondary | ICD-10-CM | POA: Diagnosis not present

## 2022-09-14 LAB — BASIC METABOLIC PANEL
Anion gap: 5 (ref 5–15)
BUN: 18 mg/dL (ref 8–23)
CO2: 29 mmol/L (ref 22–32)
Calcium: 8.7 mg/dL — ABNORMAL LOW (ref 8.9–10.3)
Chloride: 109 mmol/L (ref 98–111)
Creatinine, Ser: 0.67 mg/dL (ref 0.61–1.24)
GFR, Estimated: >60 mL/min (ref >60)
Glucose, Bld: 93 mg/dL (ref 70–99)
Potassium: 4 mmol/L (ref 3.5–5.1)
Sodium: 143 mmol/L (ref 135–145)

## 2022-09-14 LAB — CBC
HCT: 33 % — ABNORMAL LOW (ref 39.0–52.0)
Hemoglobin: 10.5 g/dL — ABNORMAL LOW (ref 13.0–17.0)
MCH: 30.1 pg (ref 26.0–34.0)
MCHC: 31.8 g/dL (ref 30.0–36.0)
MCV: 94.6 fL (ref 80.0–100.0)
Platelets: 263 10*3/uL (ref 150–400)
RBC: 3.49 MIL/uL — ABNORMAL LOW (ref 4.22–5.81)
RDW: 14.3 % (ref 11.5–15.5)
WBC: 4.3 10*3/uL (ref 4.0–10.5)
nRBC: 0 % (ref 0.0–0.2)

## 2022-09-14 LAB — MAGNESIUM: Magnesium: 2.2 mg/dL (ref 1.7–2.4)

## 2022-09-14 LAB — PHOSPHORUS: Phosphorus: 3.8 mg/dL (ref 2.5–4.6)

## 2022-09-14 NOTE — Plan of Care (Signed)

## 2022-09-14 NOTE — TOC Progression Note (Signed)
Transition of Care [facility]) - Progression Note    Patient Details  Name: Larry Buckley MRN: [id number] Date of Birth: April 02, 1941  Transition of Care [facility] CM/SW Contact  Margarito Liner, LCSW Phone Number: 09/14/2022, 12:22 PM  Clinical Narrative:  Per DSS, "I submitted his application to Medicaid on 08/17/2022, and he has an attorney ([employer]) managing his finances."   Expected Discharge Plan: Memory Care Barriers to Discharge: Other (must enter comment) (Facility will not accept patient back)  Expected Discharge Plan and Services Expected Discharge Plan: Memory Care   Discharge Planning Services: CM Consult   Living arrangements for the past 2 months: Group Home                 DME Arranged: N/A DME Agency: NA       HH Arranged: NA HH Agency: NA         Social Determinants of Health (SDOH) Interventions    Readmission Risk Interventions     No data to display

## 2022-09-14 NOTE — Progress Notes (Signed)
Progress Note   Patient: Larry Buckley [id number] DOB: Mar 30, 1941 DOA: 06/30/2022     66 DOS: the patient was seen and examined on 09/14/2022   Brief hospital course: Larry Buckley is a 81 y.o. male with medical history significant for dementia with behavioral disturbance, hypertension, hyperlipidemia, AAA with repair, gout, who initially presented to [facility] ED after aggressive and combative behavior at the group home.  He was brought into the ED on 06/24/2022 via EMS under IVC.  He has been staying in the ED, seen by psychiatry and social worker, awaiting placement.  Admitted to hospitalist service on 9/19 as patient has increased weakness. Patient has significant agitation, barely eating.  Perative care consult obtained, currently working with DSS for goal of care.   9/27: Was agitated earlier required Geodon.  9/28: TOC working on placement 9/29: Required one-time Geodon this afternoon for agitation.  Psychiatry following and increased his dose of Risperdal and Seroquel today 9/30: Per nursing note patient was combative, agitated and attempting to get out of the bed last night - sleepy this morning 10/1: Urology consulted for hematuria and change of Foley catheter 10/2-10/3: Waiting for placement. 10/4: Patient remained stable.  Waiting for placement.  Patient need LTC placement.  DSS is involved. Palliative care will follow-up as an outpatient. 10/5: Remains stable.  Still waiting for placement. 10/6: No change today.No news on placement. 10/8: Remains stable.  Still waiting for placement 10/11: Patient remained stable.  [facility] evaluated him, if we can remove Foley catheter.  On chart review could not find a reason for Foley catheter except urology note on 10/1 stating that keep Foley for 1 week for concern of falls posterior passage created during an attempt to replace Foley catheter when he accidentally pulled it out causing hematuria.  It has been more than 1 week, we will  remove Foley and give him a voiding trial. Also added Flomax.  10/13: Foley was removed and patient is voiding well.  We will check postvoid bladder scan to rule out any abnormal retention.  11/22.  Had episode of UTI, completed antibiotics.  Still pending placement.  Assessment and Plan: Dementia with behavioral problems: Continue Depakote, Remeron, Seroquel, Risperdal. Patient is doing well, he is back to his baseline mental status. There is no recent behavioral problems noted.   Urinary tract infection: Treated and completed antibiotics course. He denies any further urinary symptoms.   Essential hypertension. Blood pressure remains controlled.   Continue amlodipine.   Hematuria > Resolved.  Urinary retention > Resolved. Hematuria resolved, continue Flomax.   Protein calorie malnutrition, severe: Failure to thrive in adult: Continue nutritional support.  Supplements   Central sleep apnea: Continue supportive care.    Hyperlipidemia Continue Lipitor 20 mg daily  Patient doing well today, no change in his treatment plan.     Subjective:  Patient doing well, baseline confusion, no agitation.   Physical Exam: Vitals:   09/13/22 1629 09/13/22 1912 09/14/22 0325 09/14/22 0751  BP: 118/71 (!) 115/56 130/70 (!) 155/72  Pulse: 62 84 63 62  Resp: 18 20 16 12   Temp: (!) 97.5 F (36.4 C) 98.7 F (37.1 C) 97.8 F (36.6 C)   TempSrc: Oral Oral Oral   SpO2: 94% 98% 100% 100%  Weight:      Height:       General exam: Appears calm and comfortable  Respiratory system: Clear to auscultation. Respiratory effort normal. Cardiovascular system: S1 & S2 heard, RRR. No JVD, murmurs, rubs,  gallops or clicks. No pedal edema. Gastrointestinal system: Abdomen is nondistended, soft and nontender. No organomegaly or masses felt. Normal bowel sounds heard. Central nervous system: Alert and oriented x2. No focal neurological deficits. Extremities: Symmetric 5 x 5 power. Skin: No  rashes, lesions or ulcers Psychiatry:  Mood & affect appropriate.   Data Reviewed:  There are no new results to review at this time.  Family Communication: None  Disposition: Status is: Inpatient Remains inpatient appropriate because: No discharge options.  Planned Discharge Destination: Skilled nursing facility    Time spent: 25 minutes  Author: Marrion Coy, MD 09/14/2022 12:48 PM  For on call review [email or web address].

## 2022-09-15 DIAGNOSIS — I10 Essential (primary) hypertension: Secondary | ICD-10-CM | POA: Diagnosis not present

## 2022-09-15 DIAGNOSIS — E43 Unspecified severe protein-calorie malnutrition: Secondary | ICD-10-CM | POA: Diagnosis not present

## 2022-09-15 DIAGNOSIS — F03918 Unspecified dementia, unspecified severity, with other behavioral disturbance: Secondary | ICD-10-CM | POA: Diagnosis not present

## 2022-09-15 DIAGNOSIS — R627 Adult failure to thrive: Secondary | ICD-10-CM | POA: Diagnosis not present

## 2022-09-15 MED ORDER — HALOPERIDOL LACTATE 5 MG/ML IJ SOLN: 2.0000 mg | Freq: Four times a day (QID) | INTRAMUSCULAR | Status: DC | PRN

## 2022-09-15 MED ORDER — HALOPERIDOL LACTATE 5 MG/ML IJ SOLN
2.0000 mg | Freq: Four times a day (QID) | INTRAMUSCULAR | Status: DC | PRN
  Administered 2022-09-15: 2 mg via INTRAMUSCULAR
  Filled 2022-09-15 (×2): qty 1

## 2022-09-15 MED ORDER — TRAZODONE HCL 50 MG PO TABS
50.0000 mg | ORAL_TABLET | Freq: Every day | ORAL | Status: DC
  Administered 2022-09-15 – 2022-09-29 (×15): 50 mg via ORAL
  Filled 2022-09-15 (×15): qty 1

## 2022-09-15 NOTE — Plan of Care (Signed)

## 2022-09-15 NOTE — Progress Notes (Signed)
Mobility Specialist - Progress Note   09/15/22 1540  Mobility  Activity Transferred from chair to bed  Level of Assistance Maximum assist, patient does 25-49%  Assistive Device None  Activity Response Tolerated well  Mobility Referral Yes  $Mobility charge 1 Mobility   Pt sitting in recliner upon entry, utilizing RA. MaxA slide transfer to get Pt from recliner to bed. Pt left supine with alarm set and needs within reach.  Zetta Bills Mobility Specialist 09/15/22 3:43 PM

## 2022-09-15 NOTE — Progress Notes (Signed)
Progress Note   Patient: Larry Buckley [id number]:[id number] DOB: 1941-05-14 DOA: 07/08/2022     67 DOS: the patient was seen and examined on 09/15/2022   Brief hospital course: Larry Buckley is a 81 y.o. male with medical history significant for dementia with behavioral disturbance, hypertension, hyperlipidemia, AAA with repair, gout, who initially presented to [facility] ED after aggressive and combative behavior at the group home.  He was brought into the ED on 07/10/2022 via EMS under IVC.  He has been staying in the ED, seen by psychiatry and social worker, awaiting placement.  Admitted to hospitalist service on 9/19 as patient has increased weakness. Patient has significant agitation, barely eating.  Perative care consult obtained, currently working with DSS for goal of care.   9/27: Was agitated earlier required Geodon.  9/28: TOC working on placement 9/29: Required one-time Geodon this afternoon for agitation.  Psychiatry following and increased his dose of Risperdal and Seroquel today 9/30: Per nursing note patient was combative, agitated and attempting to get out of the bed last night - sleepy this morning 10/1: Urology consulted for hematuria and change of Foley catheter 10/2-10/3: Waiting for placement. 10/4: Patient remained stable.  Waiting for placement.  Patient need LTC placement.  DSS is involved. Palliative care will follow-up as an outpatient. 10/5: Remains stable.  Still waiting for placement. 10/6: No change today.No news on placement. 10/8: Remains stable.  Still waiting for placement 10/11: Patient remained stable.  [facility] evaluated him, if we can remove Foley catheter.  On chart review could not find a reason for Foley catheter except urology note on 10/1 stating that keep Foley for 1 week for concern of falls posterior passage created during an attempt to replace Foley catheter when he accidentally pulled it out causing hematuria.  It has been more than 1 week, we will  remove Foley and give him a voiding trial. Also added Flomax.  10/13: Foley was removed and patient is voiding well.  We will check postvoid bladder scan to rule out any abnormal retention.  11/22.  Had episode of UTI, completed antibiotics.  Still pending placement.  Assessment and Plan: Dementia with behavioral problems: Continue Depakote, Remeron, Seroquel, Risperdal. Patient is doing well, he is back to his baseline mental status. Patient is very agitated this morning, received as needed Haldol.  This is due to insomnia from missing dose of trazodone.  I will change trazodone from as needed to scheduled every evening.    Urinary tract infection: Treated and completed antibiotics course. He denies any further urinary symptoms.   Essential hypertension. Blood pressure remains controlled.   Continue amlodipine.   Hematuria > Resolved.  Urinary retention > Resolved. Hematuria resolved, continue Flomax.   Protein calorie malnutrition, severe: Failure to thrive in adult: Continue nutritional support.  Supplements   Central sleep apnea: Continue supportive care.    Hyperlipidemia Continue Lipitor 20 mg daily      Subjective:  Patient has been receiving trazodone as needed for insomnia, but if he was receiving it every night.  He did not receive a dose last night, could not sleep.  He became very combative this morning.  He quieted down after giving Haldol.  Physical Exam: Vitals:   09/14/22 1623 09/14/22 1913 09/15/22 0427 09/15/22 0854  BP: (!) 118/59 103/60 118/61 124/71  Pulse: 62 77 61 (!) 58  Resp:  16 16 18   Temp: 98.3 F (36.8 C) 98.4 F (36.9 C) (!) 96.5 F (35.8 C) (!)  97.5 F (36.4 C)  TempSrc:  Oral Axillary Oral  SpO2: 100% 100% 100% 100%  Weight:      Height:       General exam: Appears calm and comfortable  Respiratory system: Clear to auscultation. Respiratory effort normal. Cardiovascular system: S1 & S2 heard, RRR. No JVD, murmurs, rubs,  gallops or clicks. No pedal edema. Gastrointestinal system: Abdomen is nondistended, soft and nontender. No organomegaly or masses felt. Normal bowel sounds heard. Central nervous system: Alert and oriented x2. No focal neurological deficits. Extremities: Symmetric 5 x 5 power. Skin: No rashes, lesions or ulcers Psychiatry: Mood & affect appropriate.   Data Reviewed:  There are no new results to review at this time.  Family Communication: none  Disposition: Status is: Inpatient Remains inpatient appropriate because: No discharge options.  Planned Discharge Destination: Skilled nursing facility    Time spent: 35 minutes  Author: Sharen Hones, MD 09/15/2022 11:14 AM  For on call review [email or web address].

## 2022-09-15 NOTE — Plan of Care (Signed)
  Problem: Clinical Measurements: Goal: Will remain free from infection 09/15/2022 0056 by Ashok Croon, RN Outcome: Progressing 09/15/2022 0055 by Ashok Croon, RN Outcome: Progressing   Problem: Clinical Measurements: Goal: Diagnostic test results will improve 09/15/2022 0056 by Ashok Croon, RN Outcome: Progressing 09/15/2022 0055 by Ashok Croon, RN Outcome: Progressing   Problem: Clinical Measurements: Goal: Respiratory complications will improve 09/15/2022 0056 by Ashok Croon, RN Outcome: Progressing 09/15/2022 0055 by Ashok Croon, RN Outcome: Progressing

## 2022-09-15 NOTE — Progress Notes (Signed)
Mobility Specialist - Progress Note   09/15/22 1124  Mobility  Activity Transferred from bed to chair  Level of Assistance Maximum assist, patient does 25-49%  Assistive Device Front wheel walker  Distance Ambulated (ft) 4 ft  Activity Response Tolerated well  Mobility Referral Yes  $Mobility charge 1 Mobility   Pt supine upon entry, utilizing RA. Pt completed bed mob, STS to RW and ambulation MaxA. Pt transferred from the bed to recliner with RW, MaxA for sitting. Pt left in recliner with alarm set and needs within reach.   Zetta Bills Mobility Specialist 09/15/22 11:28 AM

## 2022-09-15 NOTE — Plan of Care (Signed)
  Problem: Clinical Measurements: Goal: Ability to maintain clinical measurements within normal limits will improve Outcome: Progressing   Problem: Clinical Measurements: Goal: Will remain free from infection Outcome: Progressing   Problem: Clinical Measurements: Goal: Diagnostic test results will improve Outcome: Progressing   

## 2022-09-16 DIAGNOSIS — F03918 Unspecified dementia, unspecified severity, with other behavioral disturbance: Secondary | ICD-10-CM | POA: Diagnosis not present

## 2022-09-16 NOTE — Plan of Care (Signed)

## 2022-09-16 NOTE — Progress Notes (Signed)
Progress Note   Patient: Larry Buckley [id number] DOB: 10/15/1941 DOA: 06/23/2022     68 DOS: the patient was seen and examined on 09/16/2022   Brief hospital course: Larry Buckley is a 81 y.o. male with medical history significant for dementia with behavioral disturbance, hypertension, hyperlipidemia, AAA with repair, gout, who initially presented to [facility] ED after aggressive and combative behavior at the group home.  He was brought into the ED on 06/18/2022 via EMS under IVC.  He has been staying in the ED, seen by psychiatry and social worker, awaiting placement.  Admitted to hospitalist service on 9/19 as patient has increased weakness. Patient has significant agitation, barely eating.  Perative care consult obtained, currently working with DSS for goal of care.   9/27: Was agitated earlier required Geodon.  9/28: TOC working on placement 9/29: Required one-time Geodon this afternoon for agitation.  Psychiatry following and increased his dose of Risperdal and Seroquel today 9/30: Per nursing note patient was combative, agitated and attempting to get out of the bed last night - sleepy this morning 10/1: Urology consulted for hematuria and change of Foley catheter 10/2-10/3: Waiting for placement. 10/4: Patient remained stable.  Waiting for placement.  Patient need LTC placement.  DSS is involved. Palliative care will follow-up as an outpatient. 10/5: Remains stable.  Still waiting for placement. 10/6: No change today.No news on placement. 10/8: Remains stable.  Still waiting for placement 10/11: Patient remained stable.  [facility] evaluated him, if we can remove Foley catheter.  On chart review could not find a reason for Foley catheter except urology note on 10/1 stating that keep Foley for 1 week for concern of falls posterior passage created during an attempt to replace Foley catheter when he accidentally pulled it out causing hematuria.  It has been more than 1 week, we will  remove Foley and give him a voiding trial. Also added Flomax.  10/13: Foley was removed and patient is voiding well.  We will check postvoid bladder scan to rule out any abnormal retention.  11/22.  Had episode of UTI, completed antibiotics.  Still pending placement.  Assessment and Plan: Dementia with behavioral problems: Continue Depakote, Remeron, Seroquel, Risperdal. Patient is doing well, he is back to his baseline mental status. Patient is very agitated this morning, received as needed Haldol.  This is due to insomnia from missing dose of trazodone.  I will change trazodone from as needed to scheduled every evening.    Urinary tract infection: Treated and completed antibiotics course. He denies any further urinary symptoms.   Essential hypertension. Blood pressure remains controlled.   Continue amlodipine.   Hematuria > Resolved.  Urinary retention > Resolved. Hematuria resolved, continue Flomax.   Protein calorie malnutrition, severe: Failure to thrive in adult: Continue nutritional support.  Supplements   Central sleep apnea: Continue supportive care.    Hyperlipidemia Continue Lipitor 20 mg daily      Subjective:  No issues.  Pt was sitting in chair being fed his meal by nursing.  Physical Exam: Vitals:   09/15/22 2016 09/16/22 0520 09/16/22 0841 09/16/22 1645  BP: (!) 137/92 (!) 138/91 (!) 152/77 124/79  Pulse: 71 86 60 61  Resp: 16 20 18 18   Temp: 97.9 F (36.6 C) (!) 97.4 F (36.3 C) 98.3 F (36.8 C) 98.3 F (36.8 C)  TempSrc: Oral Oral  Oral  SpO2: 98% 92% 100% 90%  Weight:      Height:  Constitutional: NAD, alert, not oriented HEENT: conjunctivae and lids normal, EOMI CV: No cyanosis.   RESP: normal respiratory effort, on RA Extremities: No effusions, edema in BLE SKIN: warm, dry Neuro: II - XII grossly intact.     Data Reviewed:  There are no new results to review at this time.  Family Communication:  none  Disposition: Status is: Inpatient Remains inpatient appropriate because: No discharge options.  Planned Discharge Destination: Skilled nursing facility    Time spent: 25 minutes  Author: Darlin Priestly, MD 09/16/2022 7:10 PM  For on call review [email or web address].

## 2022-09-17 DIAGNOSIS — I10 Essential (primary) hypertension: Secondary | ICD-10-CM | POA: Diagnosis not present

## 2022-09-17 DIAGNOSIS — E43 Unspecified severe protein-calorie malnutrition: Secondary | ICD-10-CM | POA: Diagnosis not present

## 2022-09-17 DIAGNOSIS — R310 Gross hematuria: Secondary | ICD-10-CM | POA: Diagnosis not present

## 2022-09-17 DIAGNOSIS — F03918 Unspecified dementia, unspecified severity, with other behavioral disturbance: Secondary | ICD-10-CM | POA: Diagnosis not present

## 2022-09-17 NOTE — Assessment & Plan Note (Signed)
Appreciate psych input.  Continue Depakote, Remeron, Seroquel, Risperdal and as needed Haldol 

## 2022-09-17 NOTE — Assessment & Plan Note (Signed)
Overall poor prognosis.  Palliative care and TOC team is working with DSS for placement and goals of care 

## 2022-09-17 NOTE — Plan of Care (Signed)
°  Problem: Clinical Measurements: °Goal: Ability to maintain clinical measurements within normal limits will improve °Outcome: Progressing °Goal: Will remain free from infection °Outcome: Progressing °Goal: Diagnostic test results will improve °Outcome: Progressing °Goal: Respiratory complications will improve °Outcome: Progressing °  °

## 2022-09-17 NOTE — Assessment & Plan Note (Addendum)
Treated/resolved °

## 2022-09-17 NOTE — Progress Notes (Signed)
Progress Note   Patient: Larry Buckley [id number]:[id number] DOB: 1941/07/26 DOA: 07/09/2022     69 DOS: the patient was seen and examined on 09/17/2022   Brief hospital course: Larry Buckley is a 81 y.o. male with medical history significant for dementia with behavioral disturbance, hypertension, hyperlipidemia, AAA with repair, gout, who initially presented to [facility] ED after aggressive and combative behavior at the group home.  He was brought into the ED on 07/08/2022 via EMS under IVC.  He has been staying in the ED, seen by psychiatry and social worker, awaiting placement.  Admitted to hospitalist service on 9/19 as patient has increased weakness. Patient has significant agitation, barely eating.  Perative care consult obtained, currently working with DSS for goal of care.   9/27: Was agitated earlier required Geodon.  9/28: TOC working on placement 9/29: Required one-time Geodon this afternoon for agitation.  Psychiatry following and increased his dose of Risperdal and Seroquel today 9/30: Per nursing note patient was combative, agitated and attempting to get out of the bed last night - sleepy this morning 10/1: Urology consulted for hematuria and change of Foley catheter 10/2-10/3: Waiting for placement. 10/4: Patient remained stable.  Waiting for placement.  Patient need LTC placement.  DSS is involved. Palliative care will follow-up as an outpatient. 10/5: Remains stable.  Still waiting for placement. 10/6: No change today.No news on placement. 10/8: Remains stable.  Still waiting for placement 10/11: Patient remained stable.  [facility] evaluated him, if we can remove Foley catheter.  On chart review could not find a reason for Foley catheter except urology note on 10/1 stating that keep Foley for 1 week for concern of falls posterior passage created during an attempt to replace Foley catheter when he accidentally pulled it out causing hematuria.  It has been more than 1 week, we will  remove Foley and give him a voiding trial. Also added Flomax.  10/13: Foley was removed and patient is voiding well.  We will check postvoid bladder scan to rule out any abnormal retention.  11/22.  Had episode of UTI, completed antibiotics.  12/3:  Medically stable, Still pending placement.  Assessment and Plan: * Dementia with behavioral disturbance [facility]) Appreciate psych input.  Continue Depakote, Remeron, Seroquel, Risperdal and as needed Haldol.    Essential hypertension Continue Norvasc.  Hematuria Resolved.     Protein-calorie malnutrition, severe Complicates overall prognosis  Failure to thrive in adult Overall poor prognosis.  Palliative care and TOC team is working with DSS for placement and goals of care  UTI (urinary tract infection) Treated & resolved  Central sleep apnea Monitor.        Subjective: remains same  Physical Exam: Vitals:   09/16/22 1913 09/17/22 0422 09/17/22 0726 09/17/22 1629  BP: 128/62 129/78 131/75 (!) 143/70  Pulse: 73 72 76 77  Resp: 16 18 18 18   Temp: 98.7 F (37.1 C) 97.8 F (36.6 C) 98.3 F (36.8 C) 98.1 F (36.7 C)  TempSrc: Oral   Oral  SpO2: 100% 100%  98%  Weight:      Height:       Constitutional: NAD, alert, not oriented HEENT: conjunctivae and lids normal, EOMI CV: No cyanosis.   RESP: normal respiratory effort, on RA Extremities: No effusions, edema in BLE SKIN: warm, dry Neuro: Non-focal  Data Reviewed:  There are no new results to review at this time.  Family Communication: None  Disposition: Status is: Inpatient Remains inpatient appropriate because: Medically stable.  Waiting for placement  Planned Discharge Destination: Skilled nursing facility   DVT prophylaxis - Lovenox Time spent: 15 minutes  Author: Delfino Lovett, MD 09/17/2022 4:43 PM  For on call review [email or web address].

## 2022-09-17 NOTE — Assessment & Plan Note (Signed)
Complicates overall prognosis. 

## 2022-09-17 NOTE — Assessment & Plan Note (Signed)
Monitor

## 2022-09-17 NOTE — Assessment & Plan Note (Signed)
Continue Norvasc

## 2022-09-18 DIAGNOSIS — F03918 Unspecified dementia, unspecified severity, with other behavioral disturbance: Secondary | ICD-10-CM | POA: Diagnosis not present

## 2022-09-18 DIAGNOSIS — E440 Moderate protein-calorie malnutrition: Secondary | ICD-10-CM | POA: Diagnosis not present

## 2022-09-18 DIAGNOSIS — I10 Essential (primary) hypertension: Secondary | ICD-10-CM | POA: Diagnosis not present

## 2022-09-18 DIAGNOSIS — R310 Gross hematuria: Secondary | ICD-10-CM | POA: Diagnosis not present

## 2022-09-18 NOTE — Assessment & Plan Note (Signed)
Complicates overall prognosis. 

## 2022-09-18 NOTE — Assessment & Plan Note (Signed)
Resolved

## 2022-09-18 NOTE — Assessment & Plan Note (Signed)
Continue Norvasc

## 2022-09-18 NOTE — Assessment & Plan Note (Signed)
Overall poor prognosis.  Palliative care and [facility] team is working with DSS for placement and goals of care

## 2022-09-18 NOTE — Assessment & Plan Note (Signed)
Appreciate psych input.  Continue Depakote, Remeron, Seroquel, Risperdal and as needed Haldol.

## 2022-09-18 NOTE — Assessment & Plan Note (Signed)
Monitor

## 2022-09-18 NOTE — Assessment & Plan Note (Signed)
Treated/resolved °

## 2022-09-18 NOTE — Progress Notes (Signed)
Progress Note   Patient: Larry Buckley [id number] DOB: 1941-09-25 DOA: 06/30/2022     82 DOS: the patient was seen and examined on 09/18/2022   Brief hospital course: ERION WEIGHTMAN is a 81 y.o. male with medical history significant for dementia with behavioral disturbance, hypertension, hyperlipidemia, AAA with repair, gout, who initially presented to [facility] ED after aggressive and combative behavior at the group home.  He was brought into the ED on 06/23/2022 via EMS under IVC.  He has been staying in the ED, seen by psychiatry and social worker, awaiting placement.  Admitted to hospitalist service on 9/19 as patient has increased weakness. Patient has significant agitation, barely eating.  Perative care consult obtained, currently working with DSS for goal of care.   9/27: Was agitated earlier required Geodon.  9/28: TOC working on placement 9/29: Required one-time Geodon this afternoon for agitation.  Psychiatry following and increased his dose of Risperdal and Seroquel today 9/30: Per nursing note patient was combative, agitated and attempting to get out of the bed last night - sleepy this morning 10/1: Urology consulted for hematuria and change of Foley catheter 10/2-10/3: Waiting for placement. 10/4: Patient remained stable.  Waiting for placement.  Patient need LTC placement.  DSS is involved. Palliative care will follow-up as an outpatient. 10/5: Remains stable.  Still waiting for placement. 10/6: No change today.No news on placement. 10/8: Remains stable.  Still waiting for placement 10/11: Patient remained stable.  [facility] evaluated him, if we can remove Foley catheter.  On chart review could not find a reason for Foley catheter except urology note on 10/1 stating that keep Foley for 1 week for concern of falls posterior passage created during an attempt to replace Foley catheter when he accidentally pulled it out causing hematuria.  It has been more than 1 week, we will  remove Foley and give him a voiding trial. Also added Flomax.  10/13: Foley was removed and patient is voiding well.  We will check postvoid bladder scan to rule out any abnormal retention.  11/22.  Had episode of UTI, completed antibiotics.  12/3-4:  Medically stable, Still pending placement.  Assessment and Plan: * Dementia with behavioral disturbance [facility]) Appreciate psych input.  Continue Depakote, Remeron, Seroquel, Risperdal and as needed Haldol.    Essential hypertension Continue Norvasc.  Hematuria Resolved.     Protein-calorie malnutrition, severe Complicates overall prognosis  Failure to thrive in adult Overall poor prognosis.  Palliative care and TOC team is working with DSS for placement and goals of care  UTI (urinary tract infection) Treated & resolved  Central sleep apnea Monitor.        Subjective: No new issues  Physical Exam: Vitals:   09/17/22 1941 09/18/22 0546 09/18/22 0810 09/18/22 1547  BP: 121/60 121/72 (!) 140/74 116/64  Pulse: 63 73 81 95  Resp: 16 16 18 18   Temp: 97.6 F (36.4 C) (!) 97.5 F (36.4 C) 98.1 F (36.7 C) 98.7 F (37.1 C)  TempSrc: Oral Oral Oral Oral  SpO2: 93% 100% 99% 98%  Weight:      Height:       Constitutional: NAD, alert, not oriented HEENT: conjunctivae and lids normal, EOMI CV: No cyanosis.   RESP: normal respiratory effort, on RA Extremities: No effusions, edema in BLE SKIN: warm, dry Neuro: Non-focal  Data Reviewed:  There are no new results to review at this time.  Family Communication: None  Disposition: Status is: Inpatient Remains inpatient appropriate because:  Medically stable, waiting for placement  Planned Discharge Destination: Rehab   DVT prophylaxis-Lovenox Time spent: 15 minutes  Author: Delfino Lovett, MD 09/18/2022 4:28 PM  For on call review [email or web address].

## 2022-09-19 DIAGNOSIS — I10 Essential (primary) hypertension: Secondary | ICD-10-CM | POA: Diagnosis not present

## 2022-09-19 DIAGNOSIS — E43 Unspecified severe protein-calorie malnutrition: Secondary | ICD-10-CM | POA: Diagnosis not present

## 2022-09-19 DIAGNOSIS — R310 Gross hematuria: Secondary | ICD-10-CM | POA: Diagnosis not present

## 2022-09-19 DIAGNOSIS — F03918 Unspecified dementia, unspecified severity, with other behavioral disturbance: Secondary | ICD-10-CM | POA: Diagnosis not present

## 2022-09-19 NOTE — Assessment & Plan Note (Signed)
Appreciate psych input.  Continue Depakote, Remeron, Seroquel, Risperdal and as needed Haldol 

## 2022-09-19 NOTE — TOC Progression Note (Signed)
Transition of Care [facility]) - Progression Note    Patient Details  Name: Larry Buckley MRN: [id number] Date of Birth: 09-Aug-1941  Transition of Care [facility]) CM/SW Contact  Margarito Liner, LCSW Phone Number: 09/19/2022, 11:41 AM  Clinical Narrative: [name] LTC bed available. [name] will have to call facility to arrange payment of 30 days up front. Gave information to [name] with [facility] DSS. Once payment confirmed will arrange transport, likely 1-2 days ahead of time.    Expected Discharge Plan: Memory Care Barriers to Discharge: Other (must enter comment) (Facility will not accept patient back)  Expected Discharge Plan and Services Expected Discharge Plan: Memory Care   Discharge Planning Services: CM Consult   Living arrangements for the past 2 months: Group Home                 DME Arranged: N/A DME Agency: NA       HH Arranged: NA HH Agency: NA         Social Determinants of Health (SDOH) Interventions    Readmission Risk Interventions     No data to display

## 2022-09-19 NOTE — Assessment & Plan Note (Signed)
Treated/resolved °

## 2022-09-19 NOTE — Assessment & Plan Note (Signed)
Continue Norvasc

## 2022-09-19 NOTE — Assessment & Plan Note (Signed)
Complicates overall prognosis.  He has very poor overall prognosis

## 2022-09-19 NOTE — Assessment & Plan Note (Signed)
Resolved

## 2022-09-19 NOTE — Assessment & Plan Note (Signed)
Overall poor prognosis.  Palliative care and TOC team is working with DSS for placement and goals of care 

## 2022-09-19 NOTE — Progress Notes (Signed)
Progress Note   Patient: Larry Buckley [id number] DOB: 12/11/1940 DOA: 07/02/2022     71 DOS: the patient was seen and examined on 09/19/2022   Brief hospital course: Larry Buckley is a 81 y.o. male with medical history significant for dementia with behavioral disturbance, hypertension, hyperlipidemia, AAA with repair, gout, who initially presented to [facility] ED after aggressive and combative behavior at the group home.  He was brought into the ED on 06/30/2022 via EMS under IVC.  He has been staying in the ED, seen by psychiatry and social worker, awaiting placement.  Admitted to hospitalist service on 9/19 as patient has increased weakness. Patient has significant agitation, barely eating.  Perative care consult obtained, currently working with DSS for goal of care.   9/27: Was agitated earlier required Geodon.  9/28: TOC working on placement 9/29: Required one-time Geodon this afternoon for agitation.  Psychiatry following and increased his dose of Risperdal and Seroquel today 9/30: Per nursing note patient was combative, agitated and attempting to get out of the bed last night - sleepy this morning 10/1: Urology consulted for hematuria and change of Foley catheter 10/2-10/3: Waiting for placement. 10/4: Patient remained stable.  Waiting for placement.  Patient need LTC placement.  DSS is involved. Palliative care will follow-up as an outpatient. 10/5: Remains stable.  Still waiting for placement. 10/6: No change today.No news on placement. 10/8: Remains stable.  Still waiting for placement 10/11: Patient remained stable.  [facility] evaluated him, if we can remove Foley catheter.  On chart review could not find a reason for Foley catheter except urology note on 10/1 stating that keep Foley for 1 week for concern of falls posterior passage created during an attempt to replace Foley catheter when he accidentally pulled it out causing hematuria.  It has been more than 1 week, we will  remove Foley and give him a voiding trial. Also added Flomax.  10/13: Foley was removed and patient is voiding well.  We will check postvoid bladder scan to rule out any abnormal retention.  11/22.  Had episode of UTI, completed antibiotics.  12/3-4:  Medically stable, Still pending placement. 12/5: TOC actively working to confirm bed at [facility] for LTC bed.  Hopeful for discharge soon  Assessment and Plan: * Dementia with behavioral disturbance [facility]) Appreciate psych input.  Continue Depakote, Remeron, Seroquel, Risperdal and as needed Haldol.    Essential hypertension Continue Norvasc.  Hematuria Resolved.     Protein-calorie malnutrition, severe Complicates overall prognosis.  He has very poor overall prognosis  Failure to thrive in adult Overall poor prognosis.  Palliative care and TOC team is working with DSS for placement and goals of care  UTI (urinary tract infection) Treated & resolved.  Central sleep apnea Monitor.        Subjective: Hoping to get out of here soon.  No new issues  Physical Exam: Vitals:   09/18/22 1547 09/18/22 1945 09/19/22 0513 09/19/22 0838  BP: 116/64 133/62 134/66 117/61  Pulse: 95 66 79 (!) 57  Resp: 18 20 20 18   Temp: 98.7 F (37.1 C) 98.2 F (36.8 C) 97.7 F (36.5 C) 97.6 F (36.4 C)  TempSrc: Oral  Oral   SpO2: 98% 94% 91%   Weight:      Height:       Constitutional: NAD, alert, not oriented HEENT: conjunctivae and lids normal, EOMI CV: No cyanosis.   RESP: normal respiratory effort, on RA Extremities: No effusions, edema in BLE  SKIN: warm, dry Neuro: Non-focal  Data Reviewed:  There are no new results to review at this time.  Family Communication: None  Disposition: Status is: Inpatient Remains inpatient appropriate because: Medically stable, waiting for placement  Planned Discharge Destination:  [facility]/LTC bed   DVT prophylaxis-Lovenox Time spent: 15 minutes  Author: Delfino Lovett,  MD 09/19/2022 2:39 PM  For on call review [email or web address].

## 2022-09-19 NOTE — Assessment & Plan Note (Signed)
Monitor

## 2022-09-20 DIAGNOSIS — F03918 Unspecified dementia, unspecified severity, with other behavioral disturbance: Secondary | ICD-10-CM | POA: Diagnosis not present

## 2022-09-20 NOTE — Plan of Care (Signed)
  Problem: Clinical Measurements: Goal: Ability to maintain clinical measurements within normal limits will improve Outcome: Progressing Goal: Will remain free from infection Outcome: Progressing Goal: Diagnostic test results will improve Outcome: Progressing Goal: Respiratory complications will improve Outcome: Progressing Goal: Cardiovascular complication will be avoided Outcome: Progressing   Problem: Activity: Goal: Risk for activity intolerance will decrease Outcome: Not Progressing   Problem: Nutrition: Goal: Adequate nutrition will be maintained Outcome: Progressing   Problem: Elimination: Goal: Will not experience complications related to bowel motility Outcome: Progressing Goal: Will not experience complications related to urinary retention Outcome: Progressing   Problem: Pain Managment: Goal: General experience of comfort will improve Outcome: Progressing   Problem: Safety: Goal: Ability to remain free from injury will improve Outcome: Progressing   Problem: Skin Integrity: Goal: Risk for impaired skin integrity will decrease Outcome: Progressing

## 2022-09-20 NOTE — Progress Notes (Signed)
Progress Note   Patient: Larry Buckley [id number] DOB: Aug 03, 1941 DOA: 07/10/22     72 DOS: the patient was seen and examined on 09/20/2022   Brief hospital course: Larry Buckley is a 81 y.o. male with medical history significant for dementia with behavioral disturbance, hypertension, hyperlipidemia, AAA with repair, gout, who initially presented to [facility] ED after aggressive and combative behavior at the group home.  He was brought into the ED on 07/10/2022 via EMS under IVC.  He has been staying in the ED, seen by psychiatry and social worker, awaiting placement.  Admitted to hospitalist service on 9/19 as patient has increased weakness. Patient has significant agitation, barely eating.  Perative care consult obtained, currently working with DSS for goal of care.    9/27: Was agitated earlier required Geodon.  9/28: TOC working on placement 9/29: Required one-time Geodon this afternoon for agitation.  Psychiatry following and increased his dose of Risperdal and Seroquel today 9/30: Per nursing note patient was combative, agitated and attempting to get out of the bed last night - sleepy this morning 10/1: Urology consulted for hematuria and change of Foley catheter 10/2-10/3: Waiting for placement. 10/4: Patient remained stable.  Waiting for placement.  Patient need LTC placement.  DSS is involved. Palliative care will follow-up as an outpatient. 10/5: Remains stable.  Still waiting for placement. 10/6: No change today.No news on placement. 10/8: Remains stable.  Still waiting for placement 10/11: Patient remained stable.  [facility] evaluated him, if we can remove Foley catheter.  On chart review could not find a reason for Foley catheter except urology note on 10/1 stating that keep Foley for 1 week for concern of falls posterior passage created during an attempt to replace Foley catheter when he accidentally pulled it out causing hematuria.  It has been more than 1 week, we will  remove Foley and give him a voiding trial. Also added Flomax.   10/13: Foley was removed and patient is voiding well.  We will check postvoid bladder scan to rule out any abnormal retention.   11/22.  Had episode of UTI, completed antibiotics.  12/3-4:  Medically stable, Still pending placement. 12/5: TOC actively working to confirm bed at [facility] for LTC bed.  Hopeful for discharge soon    Assessment and Plan: * Dementia with behavioral disturbance [facility]) Appreciate psych input.  Continue Depakote, Remeron, Seroquel, Risperdal and as needed Haldol.    Essential hypertension Controlled. Continue Norvasc.  Hematuria Resolved.  Protein-calorie malnutrition, severe Complicates overall prognosis.  He has very poor overall prognosis  Failure to thrive in adult Overall poor prognosis.  Palliative care and TOC team is working with DSS for placement and goals of care  UTI (urinary tract infection) Treated & resolved.  Central sleep apnea Monitor.        Subjective: resting  Physical Exam: Vitals:   09/19/22 1538 09/19/22 2039 09/20/22 0353 09/20/22 0814  BP: 126/62 125/64 131/83 (!) 160/76  Pulse: (!) 52 (!) 58 89 70  Resp: 18 18 20 18   Temp: (!) 97.3 F (36.3 C) 98.7 F (37.1 C) 98.2 F (36.8 C) 98.8 F (37.1 C)  TempSrc:  Oral Oral Axillary  SpO2: (!) 51% 98% 97% 98%  Weight:      Height:       Constitutional: NAD, asleep, rouses HEENT: conjunctivae and lids normal, EOMI CV: No cyanosis.   RESP: normal respiratory effort, on RA Extremities: No effusions, edema in BLE SKIN: warm, dry Neuro:  Non-focal  Data Reviewed:  There are no new results to review at this time.  Family Communication: None  Disposition: Status is: Inpatient Remains inpatient appropriate because: Medically stable, waiting for placement  Planned Discharge Destination:  [facility]/LTC bed   DVT prophylaxis-Lovenox    Author: Silvano Bilis, MD 09/20/2022 2:22 PM  For on  call review [email or web address].

## 2022-09-21 DIAGNOSIS — F03918 Unspecified dementia, unspecified severity, with other behavioral disturbance: Secondary | ICD-10-CM | POA: Diagnosis not present

## 2022-09-21 LAB — BASIC METABOLIC PANEL
Anion gap: 8 (ref 5–15)
BUN: 20 mg/dL (ref 8–23)
CO2: 28 mmol/L (ref 22–32)
Calcium: 8.7 mg/dL — ABNORMAL LOW (ref 8.9–10.3)
Chloride: 105 mmol/L (ref 98–111)
Creatinine, Ser: 0.82 mg/dL (ref 0.61–1.24)
GFR, Estimated: >60 mL/min (ref >60)
Glucose, Bld: 92 mg/dL (ref 70–99)
Potassium: 3.9 mmol/L (ref 3.5–5.1)
Sodium: 141 mmol/L (ref 135–145)

## 2022-09-21 NOTE — Plan of Care (Signed)
°  Problem: Clinical Measurements: °Goal: Ability to maintain clinical measurements within normal limits will improve °Outcome: Progressing °Goal: Will remain free from infection °Outcome: Progressing °Goal: Diagnostic test results will improve °Outcome: Progressing °Goal: Respiratory complications will improve °Outcome: Progressing °Goal: Cardiovascular complication will be avoided °Outcome: Progressing °  °Problem: Activity: °Goal: Risk for activity intolerance will decrease °Outcome: Progressing °  °Problem: Nutrition: °Goal: Adequate nutrition will be maintained °Outcome: Progressing °  °Problem: Elimination: °Goal: Will not experience complications related to bowel motility °Outcome: Progressing °Goal: Will not experience complications related to urinary retention °Outcome: Progressing °  °Problem: Pain Managment: °Goal: General experience of comfort will improve °Outcome: Progressing °  °

## 2022-09-21 NOTE — Progress Notes (Signed)
Progress Note   Patient: Larry Buckley [id number] DOB: [birth year] DOA: 2022/07/05     73 DOS: the patient was seen and examined on 09/21/2022   Brief hospital course: Larry Buckley is a 81 y.o. male with medical history significant for dementia with behavioral disturbance, hypertension, hyperlipidemia, AAA with repair, gout, who initially presented to [facility] ED after aggressive and combative behavior at the group home.  He was brought into the ED on 2022/07/05 via EMS under IVC.  He has been staying in the ED, seen by psychiatry and social worker, awaiting placement.  Admitted to hospitalist service on 9/19 as patient has increased weakness. Patient has significant agitation, barely eating.  Perative care consult obtained, currently working with DSS for goal of care.    9/27: Was agitated earlier required Geodon.  9/28: TOC working on placement 9/29: Required one-time Geodon this afternoon for agitation.  Psychiatry following and increased his dose of Risperdal and Seroquel today 9/30: Per nursing note patient was combative, agitated and attempting to get out of the bed last night - sleepy this morning 10/1: Urology consulted for hematuria and change of Foley catheter 10/2-10/3: Waiting for placement. 10/4: Patient remained stable.  Waiting for placement.  Patient need LTC placement.  DSS is involved. Palliative care will follow-up as an outpatient. 10/5: Remains stable.  Still waiting for placement. 10/6: No change today.No news on placement. 10/8: Remains stable.  Still waiting for placement 10/11: Patient remained stable.  [facility] evaluated him, if we can remove Foley catheter.  On chart review could not find a reason for Foley catheter except urology note on 10/1 stating that keep Foley for 1 week for concern of falls posterior passage created during an attempt to replace Foley catheter when he accidentally pulled it out causing hematuria.  It has been more than 1 week, we will  remove Foley and give him a voiding trial. Also added Flomax.   10/13: Foley was removed and patient is voiding well.  We will check postvoid bladder scan to rule out any abnormal retention.   11/22.  Had episode of UTI, completed antibiotics.  12/3-4:  Medically stable, Still pending placement. 12/5: TOC actively working to confirm bed at [facility] for LTC bed.  Hopeful for discharge soon    Assessment and Plan: * Dementia with behavioral disturbance [facility]) Appreciate psych input.  Continue Depakote, Remeron, Seroquel, Risperdal and as needed Haldol.    Essential hypertension Controlled. Continue Norvasc.  Hematuria Resolved.  Protein-calorie malnutrition, severe Complicates overall prognosis.  He has very poor overall prognosis  Failure to thrive in adult Overall poor prognosis.  Palliative care and TOC team is working with DSS for placement and goals of care  UTI (urinary tract infection) Treated & resolved.  Central sleep apnea Monitor.        Subjective: sitting in bed, no distress, confused  Physical Exam: Vitals:   09/20/22 2016 09/21/22 0515 09/21/22 0529 09/21/22 0757  BP: 118/60  116/79 126/70  Pulse: 68  (!) 59 86  Resp: 17  14 18   Temp: 98 F (36.7 C)  97.7 F (36.5 C) 97.6 F (36.4 C)  TempSrc:   Oral Oral  SpO2: 96%  100% 99%  Weight:  72.7 kg    Height:       Constitutional: NAD, asleep, rouses HEENT: conjunctivae and lids normal, EOMI CV: No cyanosis.   RESP: normal respiratory effort, on RA Extremities: No effusions, edema in BLE SKIN: warm, dry Neuro: Non-focal  Data Reviewed:  There are no new results to review at this time.  Family Communication: None  Disposition: Status is: Inpatient Remains inpatient appropriate because: no safe d/c plan  Planned Discharge Destination:  [facility]/LTC bed   DVT prophylaxis-Lovenox    Author: Desma Maxim, MD 09/21/2022 1:50 PM  For on call review [email or web address].

## 2022-09-22 DIAGNOSIS — F03918 Unspecified dementia, unspecified severity, with other behavioral disturbance: Secondary | ICD-10-CM | POA: Diagnosis not present

## 2022-09-22 NOTE — Progress Notes (Signed)
Progress Note   Patient: Larry Buckley DOB: 1941-09-10 DOA: 06/26/2022     74 DOS: the patient was seen and examined on 09/22/2022   Brief hospital course: Larry Buckley is a 81 y.o. male with medical history significant for dementia with behavioral disturbance, hypertension, hyperlipidemia, AAA with repair, gout, who initially presented to [facility] ED after aggressive and combative behavior at the group home.  He was brought into the ED on 07/03/2022 via EMS under IVC.  He has been staying in the ED, seen by psychiatry and social worker, awaiting placement.  Admitted to hospitalist service on 9/19 as patient has increased weakness. Patient has significant agitation, barely eating.  Perative care consult obtained, currently working with DSS for goal of care.    9/27: Was agitated earlier required Geodon.  9/28: TOC working on placement 9/29: Required one-time Geodon this afternoon for agitation.  Psychiatry following and increased his dose of Risperdal and Seroquel today 9/30: Per nursing note patient was combative, agitated and attempting to get out of the bed last night - sleepy this morning 10/1: Urology consulted for hematuria and change of Foley catheter 10/2-10/3: Waiting for placement. 10/4: Patient remained stable.  Waiting for placement.  Patient need LTC placement.  DSS is involved. Palliative care will follow-up as an outpatient. 10/5: Remains stable.  Still waiting for placement. 10/6: No change today.No news on placement. 10/8: Remains stable.  Still waiting for placement 10/11: Patient remained stable.  [place] [facility] evaluated him, if we can remove Foley catheter.  On chart review could not find a reason for Foley catheter except urology note on 10/1 stating that keep Foley for 1 week for concern of falls posterior passage created during an attempt to replace Foley catheter when he accidentally pulled it out causing hematuria.  It has been more than 1 week, we will  remove Foley and give him a voiding trial. Also added Flomax. 10/13: Foley was removed and patient is voiding well.  We will check postvoid bladder scan to rule out any abnormal retention. 11/22.  Had episode of UTI, completed antibiotics.  12/3-4:  Medically stable, Still pending placement. 12/5: TOC actively working to confirm bed at [facility] for LTC bed.  Hopeful for discharge soon    Assessment and Plan: * Dementia with behavioral disturbance [facility]) Appreciate psych input.  Continue Depakote, Remeron, Seroquel, Risperdal and as needed Haldol.    Essential hypertension Controlled. Continue Norvasc.  Hematuria Resolved.  Protein-calorie malnutrition, severe Complicates overall prognosis.  He has very poor overall prognosis  Failure to thrive in adult Overall poor prognosis.  Palliative care and TOC team is working with DSS for placement and goals of care  UTI (urinary tract infection) Treated & resolved.  Central sleep apnea Monitor.        Subjective: sitting in bed, no distress, confused  Physical Exam: Vitals:   09/21/22 1532 09/21/22 1934 09/22/22 0521 09/22/22 0754  BP: 123/62 137/74 123/65 (!) 141/79  Pulse: 61 81 62 (!) 107  Resp: 16 20 20 16   Temp: 97.6 F (36.4 C) 98.6 F (37 C) 97.6 F (36.4 C) (!) 97.4 F (36.3 C)  TempSrc: Oral Oral    SpO2: 98% 91% 93% 96%  Weight:      Height:       Constitutional: NAD, disheveled, in mits HEENT: conjunctivae and lids normal, EOMI CV: No cyanosis.   RESP: normal respiratory effort, on RA Extremities: warm, no edema SKIN: warm, dry Neuro: Non-focal  Data Reviewed:  There are no new results to review at this time.  Family Communication: None  Disposition: Status is: Inpatient Remains inpatient appropriate because: no safe d/c plan  Planned Discharge Destination:  [facility]/LTC bed   DVT prophylaxis-Lovenox    Author: Silvano Bilis, MD 09/22/2022 1:32 PM  For on call review  [email or web address].

## 2022-09-22 NOTE — TOC Progression Note (Signed)
Transition of Care [facility]) - Progression Note    Patient Details  Name: Larry Buckley MRN: [id number] Date of Birth: Jun 18, 1941  Transition of Care [facility]) CM/SW Contact  Margarito Liner, LCSW Phone Number: 09/22/2022, 9:52 AM  Clinical Narrative:   Angelica Pou to DSS social worker.  Expected Discharge Plan: Memory Care Barriers to Discharge: Other (must enter comment) (Facility will not accept patient back)  Expected Discharge Plan and Services Expected Discharge Plan: Memory Care   Discharge Planning Services: CM Consult   Living arrangements for the past 2 months: Group Home                 DME Arranged: N/A DME Agency: NA       HH Arranged: NA HH Agency: NA         Social Determinants of Health (SDOH) Interventions    Readmission Risk Interventions     No data to display

## 2022-09-23 DIAGNOSIS — F03918 Unspecified dementia, unspecified severity, with other behavioral disturbance: Secondary | ICD-10-CM | POA: Diagnosis not present

## 2022-09-23 MED ORDER — AMLODIPINE BESYLATE 10 MG PO TABS
10.0000 mg | ORAL_TABLET | Freq: Every day | ORAL | Status: DC
  Administered 2022-09-24 – 2022-09-26 (×3): 10 mg via ORAL
  Filled 2022-09-23 (×3): qty 1

## 2022-09-23 NOTE — Progress Notes (Signed)
Progress Note   Patient: Larry Buckley [id number]:[id number] DOB: July 08, 1941 DOA: Jul 09, 2022     75 DOS: the patient was seen and examined on 09/23/2022   Brief hospital course: Larry Buckley is a 81 y.o. male with medical history significant for dementia with behavioral disturbance, hypertension, hyperlipidemia, AAA with repair, gout, who initially presented to [facility] ED after aggressive and combative behavior at the group home.  He was brought into the ED on 07-09-2022 via EMS under IVC.  He has been staying in the ED, seen by psychiatry and social worker, awaiting placement.  Admitted to hospitalist service on 9/19 as patient has increased weakness. Patient has significant agitation, barely eating.  Perative care consult obtained, currently working with DSS for goal of care.    9/27: Was agitated earlier required Geodon.  9/28: TOC working on placement 9/29: Required one-time Geodon this afternoon for agitation.  Psychiatry following and increased his dose of Risperdal and Seroquel today 9/30: Per nursing note patient was combative, agitated and attempting to get out of the bed last night - sleepy this morning 10/1: Urology consulted for hematuria and change of Foley catheter 10/2-10/3: Waiting for placement. 10/4: Patient remained stable.  Waiting for placement.  Patient need LTC placement.  DSS is involved. Palliative care will follow-up as an outpatient. 10/5: Remains stable.  Still waiting for placement. 10/6: No change today.No news on placement. 10/8: Remains stable.  Still waiting for placement 10/11: Patient remained stable.  [facility] evaluated him, if we can remove Foley catheter.  On chart review could not find a reason for Foley catheter except urology note on 10/1 stating that keep Foley for 1 week for concern of falls posterior passage created during an attempt to replace Foley catheter when he accidentally pulled it out causing hematuria.  It has been more than 1 week, we will  remove Foley and give him a voiding trial. Also added Flomax. 10/13: Foley was removed and patient is voiding well.  We will check postvoid bladder scan to rule out any abnormal retention. 11/22.  Had episode of UTI, completed antibiotics.  12/3-4:  Medically stable, Still pending placement. 12/5: TOC actively working to confirm bed at [facility] for LTC bed.  Hopeful for discharge soon    Assessment and Plan: * Dementia with behavioral disturbance [facility]) Appreciate psych input.  Continue Depakote, Remeron, Seroquel, Risperdal and as needed Haldol.    Essential hypertension Bp elevated today. Increase norvasc to 10  Hematuria Resolved.  Protein-calorie malnutrition, severe Complicates overall prognosis.  He has very poor overall prognosis  Failure to thrive in adult Overall poor prognosis.  Palliative care and TOC team is working with DSS for placement and goals of care  UTI (urinary tract infection) Treated & resolved.  Central sleep apnea Monitor.        Subjective: sitting in bed, no distress, confused  Physical Exam: Vitals:   09/22/22 0754 09/22/22 2002 09/23/22 0403 09/23/22 0812  BP: (!) 141/79 139/81 (!) 142/83 (!) 154/78  Pulse: (!) 107 (!) 53 68 88  Resp: 16 20 18 12   Temp: (!) 97.4 F (36.3 C) 99 F (37.2 C) 98.6 F (37 C) 97.6 F (36.4 C)  TempSrc:  Oral  Oral  SpO2: 96% 93% 98% 93%  Weight:      Height:       Constitutional: NAD, disheveled, in mits HEENT: conjunctivae and lids normal, EOMI CV: No cyanosis.   RESP: normal respiratory effort, on RA Extremities: warm, no  edema SKIN: warm, dry Neuro: Non-focal  Data Reviewed:  There are no new results to review at this time.  Family Communication: None  Disposition: Status is: Inpatient Remains inpatient appropriate because: no safe d/c plan  Planned Discharge Destination:  [facility]/LTC bed   DVT prophylaxis-Lovenox    Author: Silvano Bilis, MD 09/23/2022 1:45 PM  For on call  review [email or web address].

## 2022-09-23 NOTE — Plan of Care (Signed)

## 2022-09-24 DIAGNOSIS — F03918 Unspecified dementia, unspecified severity, with other behavioral disturbance: Secondary | ICD-10-CM | POA: Diagnosis not present

## 2022-09-24 NOTE — Progress Notes (Signed)
Progress Note   Patient: Larry Buckley [id number]:[id number] DOB: 02-10-1941 DOA: 06/16/2022     76 DOS: the patient was seen and examined on 09/24/2022   Brief hospital course: Larry Buckley is a 81 y.o. male with medical history significant for dementia with behavioral disturbance, hypertension, hyperlipidemia, AAA with repair, gout, who initially presented to [facility] ED after aggressive and combative behavior at the group home.  He was brought into the ED on 06/21/2022 via EMS under IVC.  He has been staying in the ED, seen by psychiatry and social worker, awaiting placement.  Admitted to hospitalist service on 9/19 as patient has increased weakness. Patient has significant agitation, barely eating.  Perative care consult obtained, currently working with DSS for goal of care.    9/27: Was agitated earlier required Geodon.  9/28: TOC working on placement 9/29: Required one-time Geodon this afternoon for agitation.  Psychiatry following and increased his dose of Risperdal and Seroquel today 9/30: Per nursing note patient was combative, agitated and attempting to get out of the bed last night - sleepy this morning 10/1: Urology consulted for hematuria and change of Foley catheter 10/2-10/3: Waiting for placement. 10/4: Patient remained stable.  Waiting for placement.  Patient need LTC placement.  DSS is involved. Palliative care will follow-up as an outpatient. 10/5: Remains stable.  Still waiting for placement. 10/6: No change today.No news on placement. 10/8: Remains stable.  Still waiting for placement 10/11: Patient remained stable.  [place] [facility] evaluated him, if we can remove Foley catheter.  On chart review could not find a reason for Foley catheter except urology note on 10/1 stating that keep Foley for 1 week for concern of falls posterior passage created during an attempt to replace Foley catheter when he accidentally pulled it out causing hematuria.  It has been more than 1 week, we will  remove Foley and give him a voiding trial. Also added Flomax. 10/13: Foley was removed and patient is voiding well.  We will check postvoid bladder scan to rule out any abnormal retention. 11/22.  Had episode of UTI, completed antibiotics.  12/3-4:  Medically stable, Still pending placement. 12/5: TOC actively working to confirm bed at [facility] for LTC bed.     Assessment and Plan: * Dementia with behavioral disturbance [facility]) Appreciate psych input.  Continue Depakote, Remeron, Seroquel, Risperdal and as needed Haldol.    Essential hypertension Bp wnl. cont norvasc   10  Hematuria Resolved.  Protein-calorie malnutrition, severe Complicates overall prognosis.  He has very poor overall prognosis  Failure to thrive in adult Overall poor prognosis.  Palliative care and TOC team is working with DSS for placement and goals of care  UTI (urinary tract infection) Treated & resolved.  Central sleep apnea Monitor.        Subjective: sitting in bed, no distress, confused  Physical Exam: Vitals:   09/23/22 1529 09/23/22 1952 09/24/22 0622 09/24/22 0928  BP: (!) 146/66 (!) 155/88 111/62 116/61  Pulse: 88 76 (!) 40 77  Resp: 18 18 18 18   Temp: 98.2 F (36.8 C) 98.2 F (36.8 C) 98.5 F (36.9 C) 98.1 F (36.7 C)  TempSrc: Oral Oral Oral   SpO2: 97% 97% 94%   Weight:      Height:       Constitutional: NAD, disheveled, in mits HEENT: conjunctivae and lids normal, EOMI CV: No cyanosis.   RESP: normal respiratory effort, on RA Extremities: warm, no edema SKIN: warm, dry Neuro: Non-focal  Data Reviewed:  There are no new results to review at this time.  Family Communication: None  Disposition: Status is: Inpatient Remains inpatient appropriate because: no safe d/c plan  Planned Discharge Destination:  [facility]/LTC bed   DVT prophylaxis-Lovenox    Author: Silvano Bilis, MD 09/24/2022 12:23 PM  For on call review [email or web address].

## 2022-09-25 ENCOUNTER — Inpatient Hospital Stay: Payer: Medicare PPO

## 2022-09-25 DIAGNOSIS — F03918 Unspecified dementia, unspecified severity, with other behavioral disturbance: Secondary | ICD-10-CM | POA: Diagnosis not present

## 2022-09-25 NOTE — Progress Notes (Signed)
       CROSS COVER NOTE  NAME: Larry Buckley MRN: [id number] DOB : July 19, 1941 ATTENDING PHYSICIAN: Kathrynn Running, MD    Date of Service   09/25/2022   HPI/Events of Note   Message received from RN with concern that Larry Buckley may have aspirated. RN reports when assessing patient she heard gurgling sounds and Larry Buckley has thick white secretions.  Interventions   Assessment/Plan:  CXR-negative  Frequent suctioning Aspiration precautions    This document was prepared using Dragon voice recognition software and may include unintentional dictation errors.  Bishop Limbo DNP, MBA, FNP-BC Nurse Practitioner [facility] Pager [phone]

## 2022-09-25 NOTE — Progress Notes (Signed)
Progress Note   Patient: Larry Buckley [id number]:[id number] DOB: 09-23-1941 DOA: 07/09/2022     77 DOS: the patient was seen and examined on 09/25/2022   Brief hospital course: Larry Buckley is a 81 y.o. male with medical history significant for dementia with behavioral disturbance, hypertension, hyperlipidemia, AAA with repair, gout, who initially presented to [facility] ED after aggressive and combative behavior at the group home.  He was brought into the ED on 06/29/2022 via EMS under IVC.  He has been staying in the ED, seen by psychiatry and social worker, awaiting placement.  Admitted to hospitalist service on 9/19 as patient has increased weakness. Patient has significant agitation, barely eating.  Perative care consult obtained, currently working with DSS for goal of care.    9/27: Was agitated earlier required Geodon.  9/28: TOC working on placement 9/29: Required one-time Geodon this afternoon for agitation.  Psychiatry following and increased his dose of Risperdal and Seroquel today 9/30: Per nursing note patient was combative, agitated and attempting to get out of the bed last night - sleepy this morning 10/1: Urology consulted for hematuria and change of Foley catheter 10/2-10/3: Waiting for placement. 10/4: Patient remained stable.  Waiting for placement.  Patient need LTC placement.  DSS is involved. Palliative care will follow-up as an outpatient. 10/5: Remains stable.  Still waiting for placement. 10/6: No change today.No news on placement. 10/8: Remains stable.  Still waiting for placement 10/11: Patient remained stable.  [place] [facility] evaluated him, if we can remove Foley catheter.  On chart review could not find a reason for Foley catheter except urology note on 10/1 stating that keep Foley for 1 week for concern of falls posterior passage created during an attempt to replace Foley catheter when he accidentally pulled it out causing hematuria.  It has been more than 1 week, we will  remove Foley and give him a voiding trial. Also added Flomax. 10/13: Foley was removed and patient is voiding well.  We will check postvoid bladder scan to rule out any abnormal retention. 11/22.  Had episode of UTI, completed antibiotics.  12/3-4:  Medically stable, Still pending placement. 12/5: TOC actively working to confirm bed at [facility] for LTC bed.     Assessment and Plan: * Dementia with behavioral disturbance [facility]) Appreciate psych input.  Continue Depakote, Remeron, Seroquel, Risperdal and as needed Haldol.    Essential hypertension Bp wnl. cont norvasc   10  Hematuria Resolved.  Protein-calorie malnutrition, severe Complicates overall prognosis.  He has very poor overall prognosis  Failure to thrive in adult Overall poor prognosis.  Palliative care and TOC team is working with DSS for placement and goals of care  UTI (urinary tract infection) Treated & resolved.  Central sleep apnea Monitor.        Subjective: sitting in bed, no distress, confused  Physical Exam: Vitals:   09/24/22 1551 09/24/22 2000 09/25/22 0431 09/25/22 0758  BP: 126/62 (!) 140/76 128/76 130/62  Pulse: 97 (!) 108 94 89  Resp: 16 18 18 17   Temp: 98.2 F (36.8 C) 99.4 F (37.4 C) 98.7 F (37.1 C) 100 F (37.8 C)  TempSrc:  Oral    SpO2:  98% 99% 99%  Weight:      Height:       Constitutional: NAD, disheveled, in mits, resting HEENT: atraumatic CV: No cyanosis.   RESP: normal respiratory effort, on RA Extremities: warm, no edema SKIN: warm, dry Neuro: Non-focal  Data Reviewed:  There  are no new results to review at this time.  Family Communication: None  Disposition: Status is: Inpatient Remains inpatient appropriate because: no safe d/c plan  Planned Discharge Destination:  [facility]/LTC bed   DVT prophylaxis-Lovenox    Author: Silvano Bilis, MD 09/25/2022 11:15 AM  For on call review [email or web address].

## 2022-09-26 DIAGNOSIS — F03918 Unspecified dementia, unspecified severity, with other behavioral disturbance: Secondary | ICD-10-CM | POA: Diagnosis not present

## 2022-09-26 LAB — GLUCOSE, CAPILLARY: Glucose-Capillary: 96 mg/dL (ref 70–99)

## 2022-09-26 MED ORDER — AMLODIPINE BESYLATE 5 MG PO TABS
5.0000 mg | ORAL_TABLET | Freq: Every day | ORAL | Status: DC
  Administered 2022-09-27 – 2022-10-02 (×6): 5 mg via ORAL
  Filled 2022-09-26 (×6): qty 1

## 2022-09-26 NOTE — Plan of Care (Signed)

## 2022-09-26 NOTE — TOC Progression Note (Signed)
Transition of Care [facility]) - Progression Note    Patient Details  Name: Larry Buckley MRN: [id number] Date of Birth: 09/24/1941  Transition of Care [facility]) CM/SW Contact  Margarito Liner, LCSW Phone Number: 09/26/2022, 9:56 AM  Clinical Narrative:   Sent secure email to DSS/TOC team to see if there are any updates.  Expected Discharge Plan: Memory Care Barriers to Discharge: Other (must enter comment) (Facility will not accept patient back)  Expected Discharge Plan and Services Expected Discharge Plan: Memory Care   Discharge Planning Services: CM Consult   Living arrangements for the past 2 months: Group Home                 DME Arranged: N/A DME Agency: NA       HH Arranged: NA HH Agency: NA         Social Determinants of Health (SDOH) Interventions    Readmission Risk Interventions     No data to display

## 2022-09-26 NOTE — Progress Notes (Signed)
Progress Note   Patient: SOSAIA PITTINGER [id number]:[id number] DOB: November 25, 1940 DOA: 22-Jul-2022     82 DOS: the patient was seen and examined on 09/26/2022   Brief hospital course: MANAV PIEROTTI is a 81 y.o. male with medical history significant for dementia with behavioral disturbance, hypertension, hyperlipidemia, AAA with repair, gout, who initially presented to [facility] ED after aggressive and combative behavior at the group home.  He was brought into the ED on 07-22-22 via EMS under IVC.  He has been staying in the ED, seen by psychiatry and social worker, awaiting placement.  Admitted to hospitalist service on 9/19 as patient has increased weakness. Patient has significant agitation, barely eating.  Perative care consult obtained, currently working with DSS for goal of care.    9/27: Was agitated earlier required Geodon.  9/28: TOC working on placement 9/29: Required one-time Geodon this afternoon for agitation.  Psychiatry following and increased his dose of Risperdal and Seroquel today 9/30: Per nursing note patient was combative, agitated and attempting to get out of the bed last night - sleepy this morning 10/1: Urology consulted for hematuria and change of Foley catheter 10/2-10/3: Waiting for placement. 10/4: Patient remained stable.  Waiting for placement.  Patient need LTC placement.  DSS is involved. Palliative care will follow-up as an outpatient. 10/5: Remains stable.  Still waiting for placement. 10/6: No change today.No news on placement. 10/8: Remains stable.  Still waiting for placement 10/11: Patient remained stable.  [place] [facility] evaluated him, if we can remove Foley catheter.  On chart review could not find a reason for Foley catheter except urology note on 10/1 stating that keep Foley for 1 week for concern of falls posterior passage created during an attempt to replace Foley catheter when he accidentally pulled it out causing hematuria.  It has been more than 1 week, we will  remove Foley and give him a voiding trial. Also added Flomax. 10/13: Foley was removed and patient is voiding well.  We will check postvoid bladder scan to rule out any abnormal retention. 11/22.  Had episode of UTI, completed antibiotics.  12/3-4:  Medically stable, Still pending placement. 12/5: TOC actively working to confirm bed at [facility] for LTC bed.    12/11: overnight nursing concern of aspiration given coughing. Cxr neg, no hypoxia, appears asymptomatic.  Assessment and Plan: * Dementia with behavioral disturbance [facility]) Appreciate psych input.  Continue Depakote, Remeron, Seroquel, Risperdal and as needed Haldol.    Essential hypertension Bp low normal, reduce amlod to 5  Hematuria Resolved.  Protein-calorie malnutrition, severe Complicates overall prognosis.  He has very poor overall prognosis  Failure to thrive in adult Overall poor prognosis.  Palliative care and TOC team is working with DSS for placement and goals of care  UTI (urinary tract infection) Treated & resolved.  Central sleep apnea Monitor.  Aspiration? Nursing concern overnight given cough but that appears to be resolved, cxr neg, o2 100% on room air, will monitor        Subjective: sitting in bed, no distress, confused  Physical Exam: Vitals:   09/25/22 1650 09/25/22 1926 09/26/22 0321 09/26/22 0714  BP: 111/60 (!) 148/63 (!) 113/59 105/68  Pulse: (!) 53 69 77 75  Resp: 18 18 18 18   Temp: 98.1 F (36.7 C) 98.2 F (36.8 C) 98.2 F (36.8 C) 97.6 F (36.4 C)  TempSrc: Oral   Oral  SpO2: 98% 98% 99% 100%  Weight:      Height:  Constitutional: NAD, disheveled, in mits, resting HEENT: atraumatic CV: No cyanosis.   RESP: normal respiratory effort, rales at bases, otherwise clear Extremities: warm, no edema SKIN: warm, dry Neuro: Non-focal  Data Reviewed:  There are no new results to review at this time.  Family Communication: None  Disposition: Status is:  Inpatient Remains inpatient appropriate because: no safe d/c plan  Planned Discharge Destination:  [facility]/LTC bed   DVT prophylaxis-Lovenox    Author: Silvano Bilis, MD 09/26/2022 1:36 PM  For on call review [email or web address].

## 2022-09-27 DIAGNOSIS — F03918 Unspecified dementia, unspecified severity, with other behavioral disturbance: Secondary | ICD-10-CM | POA: Diagnosis not present

## 2022-09-27 DIAGNOSIS — R627 Adult failure to thrive: Secondary | ICD-10-CM | POA: Diagnosis not present

## 2022-09-27 DIAGNOSIS — E43 Unspecified severe protein-calorie malnutrition: Secondary | ICD-10-CM | POA: Diagnosis not present

## 2022-09-27 NOTE — Plan of Care (Signed)

## 2022-09-27 NOTE — Progress Notes (Signed)
PROGRESS NOTE    Larry Buckley  OHF:[id number] DOB: [birth year] DOA: 2022-07-20 PCP: Marjie Skiff, NP    Assessment & Plan:   Principal Problem:   Dementia with behavioral disturbance (HCC) Active Problems:   Essential hypertension   Hematuria   Protein-calorie malnutrition, severe   Failure to thrive in adult   Anorexia   Central sleep apnea   UTI (urinary tract infection)  Assessment and Plan: Dementia: with behavioral disturbance. Continue on depakote, mirtazapine, seroquel, risperidone. Haldol prn.    HTN: continue on amlodipine    Hematuria: resolved    Severe protein-calorie malnutrition: continue on nutritional supplements    Failure to thrive in adult: w/ poor prognosis. Palliative care and TOC team is working with DSS for placement and goals of care   UTI: completed abx course.   Central sleep apnea: continue w/ supportive    R/o aspiration: CXR shows no acute abnormalities. Saturating in upper 90s on RA       DVT prophylaxis: lovenox  Code Status: full  Family Communication:  Disposition Plan: unsafe d/c plan  Level of care: Med-Surg  Status is: Inpatient Remains inpatient appropriate because: unsafe d/c plan     Consultants:    Procedures:   Antimicrobials:   Subjective: Pt is pleasantly confused   Objective: Vitals:   09/26/22 1604 09/26/22 2017 09/27/22 0525 09/27/22 0852  BP: 134/68 129/77 125/66 104/81  Pulse: 68 68 (!) 56 (!) 59  Resp: 12 16 16 17   Temp: (!) 97.5 F (36.4 C) 97.6 F (36.4 C) 97.9 F (36.6 C) 98.3 F (36.8 C)  TempSrc: Oral  Oral   SpO2: 98% 100% 100% 98%  Weight:      Height:        Intake/Output Summary (Last 24 hours) at 09/27/2022 0854 Last data filed at 09/27/2022 09/29/2022 Gross per 24 hour  Intake 30 ml  Output 670 ml  Net -640 ml   Filed Weights   07/04/22 2230 07/06/22 1533 09/21/22 0515  Weight: 84 kg 82 kg 72.7 kg    Examination:  General exam: Appears calm and comfortable   Respiratory system: Clear to auscultation. Respiratory effort normal. Cardiovascular system: S1 & S2 +. No rubs, gallops or clicks.  Gastrointestinal system: Abdomen is nondistended, soft and nontender. Normal bowel sounds heard. Central nervous system: Alert and awake.  Psychiatry: Judgement and insight appears poor. Flat mood and affect     Data Reviewed: I have personally reviewed following labs and imaging studies  CBC: No results for input(s): "WBC", "NEUTROABS", "HGB", "HCT", "MCV", "PLT" in the last 168 hours. Basic Metabolic Panel: Recent Labs  Lab 09/21/22 0348  NA 141  K 3.9  CL 105  CO2 28  GLUCOSE 92  BUN 20  CREATININE 0.82  CALCIUM 8.7*   GFR: Estimated Creatinine Clearance: 71.4 mL/min (by C-G formula based on SCr of 0.82 mg/dL). Liver Function Tests: No results for input(s): "AST", "ALT", "ALKPHOS", "BILITOT", "PROT", "ALBUMIN" in the last 168 hours. No results for input(s): "LIPASE", "AMYLASE" in the last 168 hours. No results for input(s): "AMMONIA" in the last 168 hours. Coagulation Profile: No results for input(s): "INR", "PROTIME" in the last 168 hours. Cardiac Enzymes: No results for input(s): "CKTOTAL", "CKMB", "CKMBINDEX", "TROPONINI" in the last 168 hours. BNP (last 3 results) No results for input(s): "PROBNP" in the last 8760 hours. HbA1C: No results for input(s): "HGBA1C" in the last 72 hours. CBG: Recent Labs  Lab 09/26/22 0928  GLUCAP 96  Lipid Profile: No results for input(s): "CHOL", "HDL", "LDLCALC", "TRIG", "CHOLHDL", "LDLDIRECT" in the last 72 hours. Thyroid Function Tests: No results for input(s): "TSH", "T4TOTAL", "FREET4", "T3FREE", "THYROIDAB" in the last 72 hours. Anemia Panel: No results for input(s): "VITAMINB12", "FOLATE", "FERRITIN", "TIBC", "IRON", "RETICCTPCT" in the last 72 hours. Sepsis Labs: No results for input(s): "PROCALCITON", "LATICACIDVEN" in the last 168 hours.  No results found for this or any previous  visit (from the past 240 hour(s)).       Radiology Studies: DG Chest Port 1 View  Result Date: 09/26/2022 CLINICAL DATA:  Aspiration EXAM: PORTABLE CHEST 1 VIEW COMPARISON:  07/04/2022 FINDINGS: Cardiac shadow is within normal limits. Lungs are well aerated bilaterally. No focal infiltrate or effusion is seen. No bony abnormality is noted. IMPRESSION: No acute abnormality noted. Electronically Signed   By: Alcide Clever M.D.   On: 09/26/2022 00:01        Scheduled Meds:  amLODipine  5 mg Oral Daily   atorvastatin  20 mg Oral QHS   divalproex  500 mg Oral Q12H   enoxaparin (LOVENOX) injection  40 mg Subcutaneous Q24H   feeding supplement  237 mL Oral TID BM   melatonin  10 mg Oral QHS   mirtazapine  15 mg Oral QHS   multivitamin with minerals  1 tablet Oral Daily   polyethylene glycol  17 g Oral BID   QUEtiapine  100 mg Oral QHS   risperiDONE  1 mg Oral BID   senna-docusate  2 tablet Oral BID   tamsulosin  0.4 mg Oral QPC supper   traZODone  50 mg Oral QHS   Continuous Infusions:   LOS: 79 days    Time spent: 25 mins     Charise Killian, MD [facility] Pager [phone]  If 7PM-7AM, please contact night-coverage [email or web address] 09/27/2022, 8:54 AM

## 2022-09-27 NOTE — Plan of Care (Signed)

## 2022-09-28 DIAGNOSIS — E43 Unspecified severe protein-calorie malnutrition: Secondary | ICD-10-CM | POA: Diagnosis not present

## 2022-09-28 DIAGNOSIS — R627 Adult failure to thrive: Secondary | ICD-10-CM | POA: Diagnosis not present

## 2022-09-28 DIAGNOSIS — F03918 Unspecified dementia, unspecified severity, with other behavioral disturbance: Secondary | ICD-10-CM | POA: Diagnosis not present

## 2022-09-28 LAB — CBC
HCT: 32.4 % — ABNORMAL LOW (ref 39.0–52.0)
Hemoglobin: 10.5 g/dL — ABNORMAL LOW (ref 13.0–17.0)
MCH: 30.6 pg (ref 26.0–34.0)
MCHC: 32.4 g/dL (ref 30.0–36.0)
MCV: 94.5 fL (ref 80.0–100.0)
Platelets: 275 10*3/uL (ref 150–400)
RBC: 3.43 MIL/uL — ABNORMAL LOW (ref 4.22–5.81)
RDW: 14.4 % (ref 11.5–15.5)
WBC: 5.4 10*3/uL (ref 4.0–10.5)
nRBC: 0 % (ref 0.0–0.2)

## 2022-09-28 LAB — BASIC METABOLIC PANEL
Anion gap: 5 (ref 5–15)
BUN: 33 mg/dL — ABNORMAL HIGH (ref 8–23)
CO2: 29 mmol/L (ref 22–32)
Calcium: 8.7 mg/dL — ABNORMAL LOW (ref 8.9–10.3)
Chloride: 109 mmol/L (ref 98–111)
Creatinine, Ser: 0.71 mg/dL (ref 0.61–1.24)
GFR, Estimated: >60 mL/min (ref >60)
Glucose, Bld: 110 mg/dL — ABNORMAL HIGH (ref 70–99)
Potassium: 4.1 mmol/L (ref 3.5–5.1)
Sodium: 143 mmol/L (ref 135–145)

## 2022-09-28 NOTE — TOC Progression Note (Addendum)
Transition of Care [facility]) - Progression Note    Patient Details  Name: Larry Buckley MRN: [id number] Date of Birth: 05-Aug-1941  Transition of Care [facility]) CM/SW Contact  Margarito Liner, LCSW Phone Number: 09/28/2022, 9:34 AM  Clinical Narrative:   Sent secure email to DSS/TOC team to see if there are any updates. No response yet from last email.  3:36 pm: Received call from [name], social worker at [employer]. She has been speaking with [facility] in [place]. They are requesting an updated progress note. Faxed to [place].  Expected Discharge Plan: Memory Care Barriers to Discharge: Other (must enter comment) (Facility will not accept patient back)  Expected Discharge Plan and Services Expected Discharge Plan: Memory Care   Discharge Planning Services: CM Consult   Living arrangements for the past 2 months: Group Home                 DME Arranged: N/A DME Agency: NA       HH Arranged: NA HH Agency: NA         Social Determinants of Health (SDOH) Interventions    Readmission Risk Interventions     No data to display

## 2022-09-28 NOTE — Progress Notes (Signed)
Larry NOTE    SHEP Buckley  ZJQ:[id number] DOB: October 18, 1940 DOA: 06/16/2022 PCP: Marjie Skiff, NP    Assessment & Plan:   Principal Problem:   Dementia with behavioral disturbance (HCC) Active Problems:   Essential hypertension   Hematuria   Protein-calorie malnutrition, severe   Failure to thrive in adult   Anorexia   Central sleep apnea   UTI (urinary tract infection)  Assessment and Plan: Dementia: with behavioral disturbance. Continue on seroquel, risperidone, depakote, mirtazapine. Haldol prn    HTN: continue on amlodipine    Hematuria: resolved    Severe protein-calorie malnutrition: continue on nutritional supplements    Failure to thrive in adult: w/ poor prognosis. Palliative care & [facility] team are working w/ DSS for placement and goals of care  w/ poor prognosis. Palliative care and TOC team is working with DSS for placement and goals of care   UTI: completed abx course    Central sleep apnea: continue w/ supportive care    R/o aspiration: CXR shows no acute abnormalities. Saturating in upper 90s on RA       DVT prophylaxis: lovenox  Code Status: full  Family Communication:  Disposition Plan: unsafe d/c plan  Level of care: Med-Surg  Status is: Inpatient Remains inpatient appropriate because: unsafe d/c plan     Consultants:    Procedures:   Antimicrobials:   Subjective: Pt is still pleasantly confused   Objective: Vitals:   09/27/22 0852 09/27/22 1538 09/27/22 2136 09/28/22 0441  BP: 104/81 101/67 113/64 (!) 105/57  Pulse: (!) 59 (!) 57 81 68  Resp: 17 15 16 18   Temp: 98.3 F (36.8 C) 97.6 F (36.4 C) 98.6 F (37 C) 97.9 F (36.6 C)  TempSrc:   Oral Oral  SpO2: 98% 97% 100% 99%  Weight:      Height:        Intake/Output Summary (Last 24 hours) at 09/28/2022 0819 Last data filed at 09/27/2022 1856 Gross per 24 hour  Intake 720 ml  Output 300 ml  Net 420 ml   Filed Weights   07/04/22 2230 07/06/22 1533 09/21/22  0515  Weight: 84 kg 82 kg 72.7 kg    Examination:  General exam: Appears comfortable  Respiratory system: clear breath sounds b/l  Cardiovascular system: S1/S2+. No rubs or gallops Gastrointestinal system: Abd is soft, NT, ND & hypoactive bowel sounds  Central nervous system: alert and awake  Psychiatry: Judgement and insight appears poor     Data Reviewed: I have personally reviewed following labs and imaging studies  CBC: Recent Labs  Lab 09/28/22 0532  WBC 5.4  HGB 10.5*  HCT 32.4*  MCV 94.5  PLT 275   Basic Metabolic Panel: Recent Labs  Lab 09/28/22 0532  NA 143  K 4.1  CL 109  CO2 29  GLUCOSE 110*  BUN 33*  CREATININE 0.71  CALCIUM 8.7*   GFR: Estimated Creatinine Clearance: 73.2 mL/min (by C-G formula based on SCr of 0.71 mg/dL). Liver Function Tests: No results for input(s): "AST", "ALT", "ALKPHOS", "BILITOT", "PROT", "ALBUMIN" in the last 168 hours. No results for input(s): "LIPASE", "AMYLASE" in the last 168 hours. No results for input(s): "AMMONIA" in the last 168 hours. Coagulation Profile: No results for input(s): "INR", "PROTIME" in the last 168 hours. Cardiac Enzymes: No results for input(s): "CKTOTAL", "CKMB", "CKMBINDEX", "TROPONINI" in the last 168 hours. BNP (last 3 results) No results for input(s): "PROBNP" in the last 8760 hours. HbA1C: No results for input(s): "  HGBA1C" in the last 72 hours. CBG: Recent Labs  Lab 09/26/22 0928  GLUCAP 96   Lipid Profile: No results for input(s): "CHOL", "HDL", "LDLCALC", "TRIG", "CHOLHDL", "LDLDIRECT" in the last 72 hours. Thyroid Function Tests: No results for input(s): "TSH", "T4TOTAL", "FREET4", "T3FREE", "THYROIDAB" in the last 72 hours. Anemia Panel: No results for input(s): "VITAMINB12", "FOLATE", "FERRITIN", "TIBC", "IRON", "RETICCTPCT" in the last 72 hours. Sepsis Labs: No results for input(s): "PROCALCITON", "LATICACIDVEN" in the last 168 hours.  No results found for this or any  previous visit (from the past 240 hour(s)).       Radiology Studies: No results found.      Scheduled Meds:  amLODipine  5 mg Oral Daily   atorvastatin  20 mg Oral QHS   divalproex  500 mg Oral Q12H   enoxaparin (LOVENOX) injection  40 mg Subcutaneous Q24H   feeding supplement  237 mL Oral TID BM   melatonin  10 mg Oral QHS   mirtazapine  15 mg Oral QHS   multivitamin with minerals  1 tablet Oral Daily   polyethylene glycol  17 g Oral BID   QUEtiapine  100 mg Oral QHS   risperiDONE  1 mg Oral BID   senna-docusate  2 tablet Oral BID   tamsulosin  0.4 mg Oral QPC supper   traZODone  50 mg Oral QHS   Continuous Infusions:   LOS: 80 days    Time spent: 25 mins     Charise Killian, MD [facility] Pager [phone]  If 7PM-7AM, please contact night-coverage [email or web address] 09/28/2022, 8:19 AM

## 2022-09-29 DIAGNOSIS — R627 Adult failure to thrive: Secondary | ICD-10-CM | POA: Diagnosis not present

## 2022-09-29 DIAGNOSIS — R310 Gross hematuria: Secondary | ICD-10-CM | POA: Diagnosis not present

## 2022-09-29 DIAGNOSIS — F03918 Unspecified dementia, unspecified severity, with other behavioral disturbance: Secondary | ICD-10-CM | POA: Diagnosis not present

## 2022-09-29 DIAGNOSIS — I10 Essential (primary) hypertension: Secondary | ICD-10-CM | POA: Diagnosis not present

## 2022-09-29 NOTE — Progress Notes (Signed)
Progress Note   Patient: Larry Buckley:[id number] DOB: 1941-07-30 DOA: 14-Jul-2022     81 DOS: the patient was seen and examined on 09/29/2022   Brief hospital course: Larry Buckley is a 81 y.o. male with medical history significant for dementia with behavioral disturbance, hypertension, hyperlipidemia, AAA with repair, gout, who initially presented to [facility] ED after aggressive and combative behavior at the group home.  He was brought into the ED on 14-Jul-2022 via EMS under IVC.  He has been staying in the ED, seen by psychiatry and social worker, awaiting placement.  Admitted to hospitalist service on 9/19 as patient has increased weakness. Patient has significant agitation, barely eating.  Perative care consult obtained, currently working with DSS for goal of care.   9/27: Was agitated earlier required Geodon.  9/28: TOC working on placement 9/29: Required one-time Geodon this afternoon for agitation.  Psychiatry following and increased his dose of Risperdal and Seroquel today 9/30: Per nursing note patient was combative, agitated and attempting to get out of the bed last night - sleepy this morning 10/1: Urology consulted for hematuria and change of Foley catheter 10/2-10/3: Waiting for placement. 10/4: Patient remained stable.  Waiting for placement.  Patient need LTC placement.  DSS is involved. Palliative care will follow-up as an outpatient. 10/5: Remains stable.  Still waiting for placement. 10/6: No change today.No news on placement. 10/8: Remains stable.  Still waiting for placement 10/11: Patient remained stable.  [place] [facility] evaluated him, if we can remove Foley catheter.  On chart review could not find a reason for Foley catheter except urology note on 10/1 stating that keep Foley for 1 week for concern of falls posterior passage created during an attempt to replace Foley catheter when he accidentally pulled it out causing hematuria.  It has been more than 1 week, we will  remove Foley and give him a voiding trial. Also added Flomax.  10/13: Foley was removed and patient is voiding well.  We will check postvoid bladder scan to rule out any abnormal retention.  11/22.  Had episode of UTI, completed antibiotics.  12/3-15:  Medically stable, Still pending placement.   Assessment and Plan: * Dementia with behavioral disturbance [facility]) Appreciate psych input.  Continue Depakote, Remeron, Seroquel, Risperdal and as needed Haldol.    Essential hypertension Continue Norvasc.  Hematuria Resolved.     Protein-calorie malnutrition, severe Complicates overall prognosis.  He has very poor overall prognosis  Failure to thrive in adult Overall poor prognosis.  Palliative care and TOC team is working with DSS for placement and goals of care  UTI (urinary tract infection) Treated & resolved.  Central sleep apnea Monitor.        Subjective: No new issues.  Waiting for placement  Physical Exam: Vitals:   09/28/22 1600 09/28/22 2117 09/29/22 0348 09/29/22 0858  BP: (!) 155/83 131/65 (!) 142/85 121/60  Pulse: (!) 108 70 74 100  Resp: 19 18 20 18   Temp: 99 F (37.2 C) (!) 97.5 F (36.4 C) (!) 97.5 F (36.4 C) (!) 97.5 F (36.4 C)  TempSrc: Oral   Oral  SpO2: 99% 98%  98%  Weight:      Height:       General exam: Appears comfortable  Respiratory system: clear breath sounds b/l  Cardiovascular system: S1/S2+. No rubs or gallops Gastrointestinal system: Abd is soft, NT, ND & hypoactive bowel sounds  Central nervous system: alert and awake  Psychiatry: Judgement and insight appears poor  Data Reviewed:  There are no new results to review at this time.  Family Communication: None  Disposition: Status is: Inpatient Remains inpatient appropriate because: Medically stable, waiting for placement.  See working with DSS potential discharge at [facility] and rehab in [place] Discharge Destination:  Memory care unit   DVT  prophylaxis-Lovenox Time spent: 15 minutes  Author: Max Sane, MD 09/29/2022 2:44 PM  For on call review [email or web address].

## 2022-09-29 NOTE — Assessment & Plan Note (Signed)
Monitor

## 2022-09-29 NOTE — Assessment & Plan Note (Signed)
Treated/resolved °

## 2022-09-29 NOTE — Assessment & Plan Note (Signed)
Overall poor prognosis.  Palliative care and TOC team is working with DSS for placement and goals of care 

## 2022-09-29 NOTE — Assessment & Plan Note (Signed)
Appreciate psych input.  Continue Depakote, Remeron, Seroquel, Risperdal and as needed Haldol 

## 2022-09-29 NOTE — Assessment & Plan Note (Signed)
Complicates overall prognosis.  He has very poor overall prognosis 

## 2022-09-29 NOTE — Assessment & Plan Note (Signed)
Resolved

## 2022-09-29 NOTE — Assessment & Plan Note (Signed)
Continue Norvasc

## 2022-09-30 DIAGNOSIS — F03918 Unspecified dementia, unspecified severity, with other behavioral disturbance: Secondary | ICD-10-CM | POA: Diagnosis not present

## 2022-09-30 MED ORDER — TRAZODONE HCL 100 MG PO TABS
100.0000 mg | ORAL_TABLET | Freq: Every day | ORAL | Status: DC
  Administered 2022-09-30 – 2022-10-01 (×2): 100 mg via ORAL
  Filled 2022-09-30 (×3): qty 1

## 2022-09-30 NOTE — Progress Notes (Addendum)
Progress Note   Patient: Larry Buckley DGU:[id number] DOB: May 21, 1941 DOA: 2022/07/25     82 DOS: the patient was seen and examined on 09/30/2022   Brief hospital course: MATILDE MARKIE is a 81 y.o. male with medical history significant for dementia with behavioral disturbance, hypertension, hyperlipidemia, AAA with repair, gout, who initially presented to [facility] ED after aggressive and combative behavior at the group home.  He was brought into the ED on July 25, 2022 via EMS under IVC.  He has been staying in the ED, seen by psychiatry and social worker, awaiting placement.  Admitted to hospitalist service on 9/19 as patient has increased weakness. Patient has significant agitation, barely eating.  Perative care consult obtained, currently working with DSS for goal of care.   9/27: Was agitated earlier required Geodon.  9/28: TOC working on placement 9/29: Required one-time Geodon this afternoon for agitation.  Psychiatry following and increased his dose of Risperdal and Seroquel today 9/30: Per nursing note patient was combative, agitated and attempting to get out of the bed last night - sleepy this morning 10/1: Urology consulted for hematuria and change of Foley catheter 10/2-10/3: Waiting for placement. 10/4: Patient remained stable.  Waiting for placement.  Patient need LTC placement.  DSS is involved. Palliative care will follow-up as an outpatient. 10/5: Remains stable.  Still waiting for placement. 10/6: No change today.No news on placement. 10/8: Remains stable.  Still waiting for placement 10/11: Patient remained stable.  [place] [facility] evaluated him, if we can remove Foley catheter.  On chart review could not find a reason for Foley catheter except urology note on 10/1 stating that keep Foley for 1 week for concern of falls posterior passage created during an attempt to replace Foley catheter when he accidentally pulled it out causing hematuria.  It has been more than 1 week, we will  remove Foley and give him a voiding trial. Also added Flomax.  10/13: Foley was removed and patient is voiding well.  We will check postvoid bladder scan to rule out any abnormal retention.  11/22.  Had episode of UTI, completed antibiotics.  12/3-15:  Medically stable, Still pending placement.   Assessment and Plan: * Dementia with behavioral disturbance [facility]) Appreciate psych input.  Continue Depakote, Remeron, Seroquel, Risperdal and as needed Haldol.    Insomnia: - Increase Trazodone from 50 to 100 mg HS.  Essential hypertension Continue Norvasc.  Hematuria Resolved.     Protein-calorie malnutrition, severe Complicates overall prognosis.  He has very poor overall prognosis  Failure to thrive in adult Overall poor prognosis.  Palliative care and TOC team is working with DSS for placement and goals of care  UTI (urinary tract infection) Treated & resolved.  Central sleep apnea Monitor.        Subjective: Patient is resting comfortably.  He tells me he did not sleep well last night.  I will increase his Trazodone from 50 to 100 mg HS.  Placement is pending.  Physical Exam: Vitals:   09/29/22 1521 09/29/22 1949 09/30/22 0748 09/30/22 1530  BP: 132/68 133/68 124/74 135/75  Pulse: 97 72 71 80  Resp: 18 15 17 18   Temp: 97.7 F (36.5 C) 97.9 F (36.6 C) (!) 96.6 F (35.9 C) 98.2 F (36.8 C)  TempSrc: Oral Oral  Oral  SpO2: 93% 97% 100% 98%  Weight:      Height:       General exam: Appears comfortable  Respiratory system: clear breath sounds b/l  Cardiovascular system:  S1/S2+. No rubs or gallops Gastrointestinal system: Abd is soft, NT, ND, bowel sounds wnl Central nervous system: alert and awake  Psychiatry: Judgement and insight appears poor  Data Reviewed:  There are no new results to review at this time.  Family Communication: None  Disposition: Status is: Inpatient Remains inpatient appropriate because: Medically stable, waiting for placement.   See working with DSS potential discharge at [facility] and rehab in [place]  Planned Discharge Destination:  Memory care unit   DVT prophylaxis-Lovenox Time spent: 15 minutes  Author: Baldwin Jamaica, MD 09/30/2022 5:56 PM  For on call review [email or web address].

## 2022-10-01 DIAGNOSIS — F03918 Unspecified dementia, unspecified severity, with other behavioral disturbance: Secondary | ICD-10-CM | POA: Diagnosis not present

## 2022-10-01 NOTE — Progress Notes (Signed)
Progress Note   Patient: Larry Buckley [id number] DOB: [birth year] DOA: 07/10/2022     83 DOS: the patient was seen and examined on 10/01/2022   Brief hospital course: Larry Buckley is a 81 y.o. male with medical history significant for dementia with behavioral disturbance, hypertension, hyperlipidemia, AAA with repair, gout, who initially presented to [facility] ED after aggressive and combative behavior at the group home.  He was brought into the ED on 06/23/2022 via EMS under IVC.  He has been staying in the ED, seen by psychiatry and social worker, awaiting placement.  Admitted to hospitalist service on 9/19 as patient has increased weakness. Patient has significant agitation, barely eating.  Perative care consult obtained, currently working with DSS for goal of care.   9/27: Was agitated earlier required Geodon.  9/28: TOC working on placement 9/29: Required one-time Geodon this afternoon for agitation.  Psychiatry following and increased his dose of Risperdal and Seroquel today 9/30: Per nursing note patient was combative, agitated and attempting to get out of the bed last night - sleepy this morning 10/1: Urology consulted for hematuria and change of Foley catheter 10/2-10/3: Waiting for placement. 10/4: Patient remained stable.  Waiting for placement.  Patient need LTC placement.  DSS is involved. Palliative care will follow-up as an outpatient. 10/5: Remains stable.  Still waiting for placement. 10/6: No change today.No news on placement. 10/8: Remains stable.  Still waiting for placement 10/11: Patient remained stable.  [place] evaluated him, if we can remove Foley catheter.  On chart review could not find a reason for Foley catheter except urology note on 10/1 stating that keep Foley for 1 week for concern of falls posterior passage created during an attempt to replace Foley catheter when he accidentally pulled it out causing hematuria.  It has been more than 1 week, we will  remove Foley and give him a voiding trial. Also added Flomax.  10/13: Foley was removed and patient is voiding well.  We will check postvoid bladder scan to rule out any abnormal retention.  11/22.  Had episode of UTI, completed antibiotics.  12/3-15:  Medically stable, Still pending placement.   Assessment and Plan: * Dementia with behavioral disturbance [facility]) Appreciate psych input.  Continue Depakote, Remeron, Seroquel, Risperdal and as needed Haldol.    Insomnia: - Continue Trazodone at 100 mg HS.  Essential hypertension Continue Norvasc.  Hematuria Resolved.  Protein-calorie malnutrition, severe Complicates overall prognosis.  He has very poor overall prognosis  Failure to thrive in adult Overall poor prognosis.  Palliative care and TOC team is working with DSS for placement and goals of care  UTI (urinary tract infection) Treated & resolved.  Central sleep apnea Monitor.        Subjective: Patient slept better last night on higher Trazodone dosing.  Mood is stable today.  He denies any chest pain or shortness of breath.  Placement is pending.  Physical Exam: Vitals:   09/30/22 1530 09/30/22 1958 10/01/22 0522 10/01/22 0803  BP: 135/75 111/67 125/76 129/60  Pulse: 80 72 63 62  Resp: 18 16 20 17   Temp: 98.2 F (36.8 C) (!) 97.5 F (36.4 C) (!) 97.4 F (36.3 C) (!) 97.5 F (36.4 C)  TempSrc: Oral Axillary Oral   SpO2: 98% 100% 94% (!) 81%  Weight:      Height:       General exam: Appears comfortable  Respiratory system: clear breath sounds b/l  Cardiovascular system: S1/S2+. No rubs or gallops  Gastrointestinal system: Abd is soft, NT, ND, bowel sounds wnl Central nervous system: alert and awake  Psychiatry: Judgement and insight appears poor  Data Reviewed:  There are no new results to review at this time.  Family Communication: None  Disposition: Status is: Inpatient Remains inpatient appropriate because: Medically stable, waiting for  placement.  See working with DSS potential discharge at [facility] and rehab in [place]  Planned Discharge Destination:  Memory care unit   DVT prophylaxis-Lovenox Time spent: 15 minutes  Author: Baldwin Jamaica, MD 10/01/2022 3:08 PM  For on call review [email or web address].

## 2022-10-02 DIAGNOSIS — G4731 Primary central sleep apnea: Secondary | ICD-10-CM | POA: Diagnosis not present

## 2022-10-02 DIAGNOSIS — F03918 Unspecified dementia, unspecified severity, with other behavioral disturbance: Secondary | ICD-10-CM | POA: Diagnosis not present

## 2022-10-02 DIAGNOSIS — R627 Adult failure to thrive: Secondary | ICD-10-CM | POA: Diagnosis not present

## 2022-10-02 DIAGNOSIS — E440 Moderate protein-calorie malnutrition: Secondary | ICD-10-CM | POA: Diagnosis not present

## 2022-10-02 DIAGNOSIS — I469 Cardiac arrest, cause unspecified: Secondary | ICD-10-CM

## 2022-10-02 LAB — GLUCOSE, CAPILLARY
Glucose-Capillary: 86 mg/dL (ref 70–99)
Glucose-Capillary: 157 mg/dL — ABNORMAL HIGH (ref 70–99)

## 2022-10-02 MED ORDER — EPINEPHRINE HCL 5 MG/250ML IV SOLN IN NS
0.5000 ug/min | INTRAVENOUS | Status: DC
  Filled 2022-10-02: qty 250

## 2022-10-02 MED ORDER — HYDROCORTISONE SOD SUC (PF) 100 MG IJ SOLR: 100.0000 mg | INTRAMUSCULAR | Status: DC

## 2022-10-02 MED ORDER — NOREPINEPHRINE 4 MG/250ML-% IV SOLN: 0.0000 ug/min | INTRAVENOUS | Status: DC

## 2022-10-03 DIAGNOSIS — E43 Unspecified severe protein-calorie malnutrition: Secondary | ICD-10-CM | POA: Diagnosis not present

## 2022-10-03 DIAGNOSIS — F03918 Unspecified dementia, unspecified severity, with other behavioral disturbance: Secondary | ICD-10-CM | POA: Diagnosis not present

## 2022-10-03 DIAGNOSIS — E440 Moderate protein-calorie malnutrition: Secondary | ICD-10-CM | POA: Diagnosis not present

## 2022-10-03 DIAGNOSIS — G4731 Primary central sleep apnea: Secondary | ICD-10-CM | POA: Diagnosis not present

## 2022-10-03 DIAGNOSIS — I469 Cardiac arrest, cause unspecified: Secondary | ICD-10-CM | POA: Diagnosis not present

## 2022-10-03 DIAGNOSIS — R627 Adult failure to thrive: Secondary | ICD-10-CM | POA: Diagnosis not present

## 2022-10-03 DIAGNOSIS — I10 Essential (primary) hypertension: Secondary | ICD-10-CM | POA: Diagnosis not present

## 2022-10-03 DIAGNOSIS — N3000 Acute cystitis without hematuria: Secondary | ICD-10-CM | POA: Diagnosis not present

## 2022-10-04 DIAGNOSIS — I469 Cardiac arrest, cause unspecified: Secondary | ICD-10-CM | POA: Insufficient documentation

## 2022-10-16 NOTE — Progress Notes (Signed)
OT Cancellation Note  Patient Details Name: Larry Buckley MRN: [id number] DOB: 10-06-1941   Cancelled Treatment:    Reason Eval/Treat Not Completed: OT screened, no needs identified, will sign off. Duplicate orders received. Pt evaluated by OT this admission and signed off as pt is at baseline with cognition limiting participation in therapy at this time. No new changes noted in chart. Please see 09/11/22 note for recommendations. Thank you.     Kathie Dike, M.S. OTR/L  2022-10-15, 11:08 AM  ascom [phone]

## 2022-10-16 NOTE — Progress Notes (Incomplete)
  Brief hospital course: Larry Buckley is a 82 y.o. male with medical history significant for dementia with behavioral disturbance, hypertension, hyperlipidemia, AAA with repair, gout, who initially presented to [facility] ED after aggressive and combative behavior at the group home.  He was brought into the ED on July 08, 2022 via EMS under IVC.  He has been staying in the ED, seen by psychiatry and social worker, awaiting placement.  Admitted to hospitalist service on 9/19 as patient had increased weakness, agitation and poor po intake. Patient was awaiting LTC placement under the care of DSS when he was found this evening unresponsive by primary RN who initiated code blue.  CARDIOPULMONARY RESUSCITATION Initial rhythm: PEA CPR performance duration: *** Was defibrillation or cardioversion used ? *** Was external pacer placed ? *** Was patient intubated pre/post CPR ? *** Was transvenous pacer placed ? ***  Medications Administered Include:      Yes/no Amiodarone   Atropine   Calcium   Epinephrine   Lidocaine   Magnesium   Norepinephrine   Phenylephrine   Sodium bicarbonate   Vasopression    Evaluation Final Status - Was patient successfully resuscitated ? *** If successfully resuscitated - what is current rhythm ? *** If successfully resuscitated - what is current hemodynamic status ? Patient was hemodynamically unstable at the time of transfer to ICU with very faint femoral pulses.  ICU Course: On arrival to the ICU, patient  ACLS H's and T's  -Hypovolemia  -Hypoxia -Hydrogen Ion excess (acidosis) -Hypoglycemia -Hypokalemia / Hyperkalemia  -Hypothermia  -Tension pneumothorax  -Toxins  -Thrombosis PE / MI           Additional CC time 32 mins    Webb Silversmith DNP, FNP-BC, AGACNP-BC [facility] Pager: [phone] [place] at [facility]

## 2022-10-16 NOTE — ED Provider Notes (Signed)
CODE BLUE note  Provider was called to patient bedside after a CODE BLUE was called.  Patient was reportedly found unresponsive after nurse went to check vital signs.  Upon my arrival, patient was receiving CPR and currently being bagged with a BVM.  Patient was intubated with a 7.5 tube with adequate breath sounds over bilateral lung fields as well as positive color change on CO2 detector.  The tube was also visualized passing through the cords on video laryngoscopy.  This procedure was performed without RSI as patient was nonresponsive at the time.  Patient received multiple rounds of CPR with epinephrine administration prior to ROSC.  Patient transferred to ICU   Merwyn Katos, MD 09/16/2022 2132

## 2022-10-16 NOTE — Progress Notes (Signed)
This nurse arrived to room to find patient unresponsive laying sideways in the bed. No pulse found. Code Blue called and compressions started. Code team, MD, Bishop Limbo NP arrived.

## 2022-10-16 NOTE — Death Summary Note (Signed)
   DEATH SUMMARY   Patient Details  Name: Larry Buckley MRN: [id number] DOB: Mar 02, 1941 [id number], Dorie Rank, NP Admission/Discharge Information   Admit Date:  2022/07/16  Date of Death: Date of Death: 21-Oct-2022  Time of Death: Time of Death: Feb 07, 2124  Length of Stay: 02/07/84   Principle Cause of death: Cardiac arrest/PEA  Hospital Diagnoses: Principal Problem:   Dementia with behavioral disturbance (HCC) Active Problems:   Essential hypertension   Hematuria   Protein-calorie malnutrition, severe   Failure to thrive in adult   Anorexia   Central sleep apnea   UTI (urinary tract infection)   PEA (Pulseless electrical activity) [facility])   Hospital Course: [name] is a 82 y.o. male with medical history significant for dementia with behavioral disturbance, hypertension, hyperlipidemia, AAA with repair, gout, who initially presented to [facility] ED after aggressive and combative behavior at the group home.  He was brought into the ED on 2022/07/16 via EMS under IVC.  He has been staying in the ED, seen by psychiatry and social worker, awaiting placement.  Admitted to hospitalist service on 9/19 as patient has increased weakness.  Patient has significant agitation and barely eating.  Palliative medicine consult obtained and started working with DSS for goal of care.  Patient continued to have intermittent agitation requiring scheduled and as needed antipsychotics with guidance from psychiatry.  Urology consultation obtained on 07/16/2022 for hematuria and Foley catheter change.  Foley catheter removed on 10/13 and patient passed voiding trial.  He was done most with UTI on 09/06/2022 and treated with antibiotics.  Other than that, patient remained stable waiting on SNF placement for the remainder of his hospitalization until his PEA arrest on 10/21/2022.  Patient was successfully resuscitated with ROSC.  He was intubated and transferred to ICU.  However, he lost pulse again and resuscitation efforts  were terminated and patient pronounced dead at 9:24 PM.          Consultations: Psychiatry intensive care and palliative medicine  The results of significant diagnostics from this hospitalization (including imaging, microbiology, ancillary and laboratory) are listed below for reference.   Significant Diagnostic Studies: DG Chest Port 1 View  Result Date: 09/26/2022 CLINICAL DATA:  Aspiration EXAM: PORTABLE CHEST 1 VIEW COMPARISON:  07/04/2022 FINDINGS: Cardiac shadow is within normal limits. Lungs are well aerated bilaterally. No focal infiltrate or effusion is seen. No bony abnormality is noted. IMPRESSION: No acute abnormality noted. Electronically Signed   By: Alcide Clever M.D.   On: 09/26/2022 00:01    Microbiology: No results found for this or any previous visit (from the past 240 hour(s)).  Time spent: 35 minutes  Signed: Almon Hercules, MD 09/29/2022  , Intensivist intensivist

## 2022-10-16 NOTE — Progress Notes (Signed)
Progress Note   Patient: Larry Buckley DOB: 06/07/1941 DOA: 07/02/2022     84 DOS: the patient was seen and examined on 05-Oct-2022   Brief hospital course: Larry Buckley is a 82 y.o. male with medical history significant for dementia with behavioral disturbance, hypertension, hyperlipidemia, AAA with repair, gout, who initially presented to [facility] ED after aggressive and combative behavior at the group home.  He was brought into the ED on 06/18/2022 via EMS under IVC.  He has been staying in the ED, seen by psychiatry and social worker, awaiting placement.  Admitted to hospitalist service on 9/19 as patient has increased weakness. Patient has significant agitation, barely eating.  Perative care consult obtained, currently working with DSS for goal of care.   9/27: Was agitated earlier required Geodon.  9/28: TOC working on placement 9/29: Required one-time Geodon this afternoon for agitation.  Psychiatry following and increased his dose of Risperdal and Seroquel today 9/30: Per nursing note patient was combative, agitated and attempting to get out of the bed last night - sleepy this morning 10/1: Urology consulted for hematuria and change of Foley catheter 10/2-10/3: Waiting for placement. 10/4: Patient remained stable.  Waiting for placement.  Patient need LTC placement.  DSS is involved. Palliative care will follow-up as an outpatient. 10/5: Remains stable.  Still waiting for placement. 10/6: No change today.No news on placement. 10/8: Remains stable.  Still waiting for placement 10/11: Patient remained stable.  [place] evaluated him, if we can remove Foley catheter.  On chart review could not find a reason for Foley catheter except urology note on 10/1 stating that keep Foley for 1 week for concern of falls posterior passage created during an attempt to replace Foley catheter when he accidentally pulled it out causing hematuria.  It has been more than 1 week, we will  remove Foley and give him a voiding trial. Also added Flomax.  10/13: Foley was removed and patient is voiding well.  We will check postvoid bladder scan to rule out any abnormal retention.  11/22.  Had episode of UTI, completed antibiotics.  12/03-18:  Medically stable, Still pending placement.   Assessment and Plan: Dementia with behavioral disturbance (HCC) -Continue Depakote, Remeron, Seroquel, Risperdal and as needed Haldol per psych. -Consider intermittent EKG to monitor QTc.  Insomnia: - Continue Trazodone at 100 mg HS.  Essential hypertension -Continue Norvasc.  Hematuria: Resolved.  Protein-calorie malnutrition, severe Failure to thrive in adult Complicates overall prognosis.  He has very poor overall prognosis Palliative care and TOC team is working with DSS for placement and goals of care  UTI: Treated and resolved. Treated & resolved.  Central sleep apnea -Monitor.        Subjective:  Seen and examined earlier this morning.  Patient was up all night and fell asleep this morning.  He was difficult to arouse but withdrawal to noxious stimuli.  Per RN, he was able to wake up and take p.o. medications early in the morning.  Physical Exam: Vitals:   10/01/22 1609 10/01/22 2026 10-05-2022 0433 2022-10-05 0826  BP: 138/67 135/65 104/69 (!) 143/83  Pulse: 70 70 60 (!) 58  Resp: 17 18 15 18   Temp: 98 F (36.7 C) 98.1 F (36.7 C) 97.9 F (36.6 C) 97.6 F (36.4 C)  TempSrc: Axillary Axillary Oral   SpO2: 100% 100% (!) 71% 99%  Weight:      Height:       GENERAL: No apparent distress.  Nontoxic.  NECK: Supple.  No apparent JVD.  RESP:  No IWOB.  Fair aeration bilaterally. CVS:  RRR. Heart sounds normal.  ABD/GI/GU: BS+. Abd soft, NTND.  MSK/EXT: Withdraws all extremities to noxious stimuli. NEURO: Somnolent but withdraws to noxious stimuli.  No apparent focal neuro deficit but limited exam due to mental status. PSYCH: Calm. Normal affect.   There are no new  results to review at this time.  Family Communication: None at the bedside.  Disposition: Status is: Inpatient Remains inpatient appropriate because: Medically stable, waiting for placement.  See working with DSS potential discharge at [facility] and rehab in [place]  Planned Discharge Destination:  Memory care unit   DVT prophylaxis-Lovenox Time spent: 15 minutes  Author: Almon Hercules, MD 10/04/2022 2:52 PM  For on call review [email or web address].

## 2022-10-16 NOTE — Progress Notes (Signed)
  CODE BLUE NOTE   Patient Name: Larry Buckley   MRN: [id number]   Date of Birth/ Sex: 1941-04-03 , male      Admission Date: 07-13-22  Attending Provider: Almon Hercules, MD  Primary Diagnosis: Dementia with behavioral disturbance [facility])   Cardiopulmonary Resuscitation Directed by: Bishop Limbo DNP, FNP-BC   I personally directed ancillary staff and/or performed CPR in an effort to regain return of spontaneous circulation. ED MD Dr Vicente Males present and intubated patient as well as assisted with direction of cardiopulmonary resuscitation.  Indication: This evening RN went into room for vital sign check and Larry Buckley was noted to be unresponsive. Code blue was subsequently called. At the time of arrival on scene, ACLS protocol was underway.  Larry Buckley is an 82 y.o. male with medical history significant for dementia with behavioral disturbance, hypertension, hyperlipidemia, AAA with repair, gout, who initially presented to [facility] ED after aggressive and combative behavior at the group home. After holding in the ED for ~1 week he was admitted to the Hospitalist service for increased weakness. He has been medically stable and awaiting placement.    Technical Description:  - CPR performance duration:  14 minutes  - Was defibrillation or cardioversion used? No   - Was external pacer placed? No  - Was patient intubated pre/post CPR? Yes    Medications Administered: Y = Yes; Blank = No Amiodarone    Atropine    Calcium    Epinephrine   Y x5  Lidocaine    Magnesium    Norepinephrine   Y  Phenylephrine    Sodium bicarbonate   Y x2  Vasopressin   D50   1L Normal Saline  Y          Post CPR evaluation:  - Final Status - Was patient successfully resuscitated ? Yes - What is current rhythm? NSR with ST elevations noted on ICU monitor - What is current hemodynamic status? Hemodynamically unstable requiring Levophed and Epinephrine   Miscellaneous Information:  - Labs sent, including:  N/A         - Additional notes/ transfer status: ROSC obtained and Larry Buckley transferred to ICU, he later lost a pulse and obtained ROSC, and finally lost pulses once more. He was pronounced deceased by [facility] team at 04-Feb-2123. ST elevations noted on ICU monitor.         - Family Notified? DSS on call Social Worker Weldon Inches notified at [phone] that Larry Buckley is deceased. Requested permission to call son listed in chart and M(r)s Andrey Campanile says she will contact her manager and call back.  0947: Permission received from Weldon Inches to call son. Attempted to reach son at [phone], no answer, HIPAA compliant voicemail left requesting call back.    This document was prepared using Dragon voice recognition software and may include unintentional dictation errors.  Bishop Limbo DNP, MBA, FNP-BC Nurse Practitioner [facility] Pager [phone]

## 2022-10-16 NOTE — Progress Notes (Signed)
Code Blue was called overhead, and rapid response nurse responded. ROSC obtained - see rapid response documentation. Room 17 was set up prior to patient arrival. Upon arrival patient was in PEA -See Code Blue documentation. TOD 9:24 P.M. Post mortem care performed.

## 2022-10-16 NOTE — Progress Notes (Signed)
PT Cancellation Note  Patient Details Name: Larry Buckley MRN: [id number] DOB: 12-Feb-1941   Cancelled Treatment:  PT consult received and chart reviewed.  Per previous notes, patient previously evaluated and trialed on PT caseload twice during this admission--consistently unable to participate/progress with skilled intervention due to baseline cognitive deficits/progressive dementia. No change in medical status, cognitive status or mobility needs noted in chart that warrant formal re-evaluation.  Thus, recommendations for LTC/memory care are still the most appropriate for patient.   Repeat order discontinued.  MD/TOC informed/aware.     Humna Moorehouse H. Manson Passey, PT, DPT, NCS 09/17/2022, 12:07 PM [phone]

## 2022-10-16 NOTE — Progress Notes (Signed)
       CROSS COVER NOTE  NAME: Larry Buckley MRN: [id number] DOB : 08-30-1941 ATTENDING PHYSICIAN: Almon Hercules, MD    Date of Service   10/05/22   HPI/Events of Note   day shift tech did bladder scan before leaving and he has in bladder. They said he peed a small amount this morning maybe   Interventions   Assessment/Plan: In and Out X X        To reach the provider On-Call:   7AM- 7PM see care teams to locate the attending and reach out to them via [email or web address]. 7PM-7AM contact night-coverage If you still have difficulty reaching the appropriate provider, please page the [facility] (Director on Call) for [facility] on amion for assistance  This document was prepared using Sales executive software and may include unintentional dictation errors.  Bishop Limbo DNP, MBA, FNP-BC Nurse Practitioner [facility] Pager [phone]

## 2022-10-16 NOTE — Progress Notes (Addendum)
Discussed case with Alda Lea ME. Confirmed with primary RN Jodie that patient had not had a fall. Patient does not meet criteria for ME Case per K. Sharpe. ICU charge RN notified.

## 2022-10-16 NOTE — Progress Notes (Signed)
During Code Event chaplain called Nancy Fetter, guardian [phone]. Oncall staff returned call and updated medical staff. Chaplain offered a pause to honor Chrissie Noa at time of death.     10/10/2022 01/26/2099  Clinical Encounter Type  Visited With Patient  Visit Type Code

## 2022-10-16 NOTE — Progress Notes (Addendum)
Brief hospital course: Larry Buckley is a 82 y.o. male with medical history significant for dementia with behavioral disturbance, hypertension, hyperlipidemia, AAA with repair, gout, who initially presented to [facility] ED after aggressive and combative behavior at the group home.  He was brought into the ED on 05-Jul-2022 via EMS under IVC.  He has been staying in the ED, seen by psychiatry and social worker, awaiting placement.  Admitted to hospitalist service on 9/19 as patient had increased weakness, agitation and poor po intake. Patient was awaiting LTC placement under the care of DSS when he was found this evening unresponsive by primary RN who initiated code blue.  Significant Hospital Events: 9/12: presented to the ED from group home with behavioral disturbances. Placed in IVC 9/19: Admitted to hospitalist service for increased weakness 9/27: Was agitated earlier required Geodon.  9/28: TOC working on placement 9/29: Required one-time Geodon this afternoon for agitation.  Psychiatry following and increased his dose of Risperdal and Seroquel today 9/30: Per nursing note patient was combative, agitated and attempting to get out of the bed last night - sleepy this morning 10/1: Urology consulted for hematuria and change of Foley catheter 10/2-10/3: Waiting for placement. 10/4: Patient remained stable.  Waiting for placement.  Patient need LTC placement.  DSS is involved. Palliative care will follow-up as an outpatient. 10/5: Remains stable.  Still waiting for placement. 10/6: No change today.No news on placement. 10/8: Remains stable.  Still waiting for placement 10/11: Patient remained stable.  [place] [facility] evaluated him, if we can remove Foley catheter.  On chart review could not find a reason for Foley catheter except urology note on 10/1 stating that keep Foley for 1 week for concern of falls posterior passage created during an attempt to replace Foley catheter when he accidentally pulled  it out causing hematuria.  It has been more than 1 week, we will remove Foley and give him a voiding trial. Also added Flomax. 10/13: Foley was removed and patient is voiding well.  We will check postvoid bladder scan to rule out any abnormal retention.  11/22.  Had episode of UTI, completed antibiotics.  12/3-15:  Medically stable, Still pending placement. 12/18: Code blue called on the floor for unresponsiveness. CPR initiated and patient immediately intubated for airway protection while CPR/ACLS in progress. ROSC obtained after 15 minutes of high quality CPR/ACLS and patient transferred to the ICU. On arrival to the ICU, he lost pulses again and CPR/ALCS initiated.     CARDIOPULMONARY RESUSCITATION Initial rhythm: PEA CPR performance duration: 15 minutes on the floor and 1 round of CPR/ACLS in the ICU 5-6 minutes Was defibrillation or cardioversion used ? No Was external pacer placed ? No Was patient intubated pre/post CPR ? Intubated during CPR Was transvenous pacer placed ? No   Medications Administered Include:                                                   Yes/no Amiodarone no  Atropine no  Calcium Yes x 2  Epinephrine Yes x 6  Lidocaine no  Magnesium no  Norepinephrine yes  Phenylephrine no  Sodium bicarbonate Yes x 2  Vasopression no    Evaluation Final Status - Was patient successfully resuscitated ? left femoral pulses present by doppler If successfully resuscitated - what is current rhythm ? N/a If successfully  resuscitated - what is current hemodynamic status ? N/a     Assessment & Plan:  PEA Cardiac Arrest Circulatory shock PMHx:AAA, HLD, HTN Patient initially successfully resuscitated with ROSC however he lost pulses again   -Intubated during initial CPR/ACLS prior to transfer to the ICU  Resuscitation efforts were terminated and patient pronounced dead at 21:24 pm after multiple attempts to resuscitate and review of ACLS H's and T's  -Hypovolemia   -Hypoxia -Hydrogen Ion excess (acidosis) -Hypoglycemia -Hypokalemia / Hyperkalemia  -Hypothermia  -Tension pneumothorax  -Toxins  -Thrombosis PE / MI  EKG review as below concerning for STEMI and probable cause of Cardiac Arrest in the absence of above etiology.               Additional CC time 45 mins     Webb Silversmith, DNP, CCRN, FNP-C, AGACNP-BC Acute Care & Family Nurse Practitioner  [facility]  See Amion for personal pager PCCM on call pager [phone] until 7 am

## 2022-10-16 DEATH — deceased
